# Patient Record
Sex: Male | Born: 1935
Health system: Southern US, Community
[De-identification: ages and names within clinical notes are randomized; demographics above are authoritative.]

## PROBLEM LIST (undated history)

## (undated) DIAGNOSIS — J309 Allergic rhinitis, unspecified: Secondary | ICD-10-CM

## (undated) DIAGNOSIS — R001 Bradycardia, unspecified: Secondary | ICD-10-CM

## (undated) DIAGNOSIS — G709 Myoneural disorder, unspecified: Secondary | ICD-10-CM

## (undated) DIAGNOSIS — C801 Malignant (primary) neoplasm, unspecified: Secondary | ICD-10-CM

## (undated) DIAGNOSIS — M4800 Spinal stenosis, site unspecified: Secondary | ICD-10-CM

## (undated) DIAGNOSIS — E785 Hyperlipidemia, unspecified: Secondary | ICD-10-CM

## (undated) DIAGNOSIS — E559 Vitamin D deficiency, unspecified: Secondary | ICD-10-CM

## (undated) DIAGNOSIS — I10 Essential (primary) hypertension: Secondary | ICD-10-CM

## (undated) DIAGNOSIS — I4891 Unspecified atrial fibrillation: Secondary | ICD-10-CM

## (undated) DIAGNOSIS — M1990 Unspecified osteoarthritis, unspecified site: Secondary | ICD-10-CM

## (undated) HISTORY — DX: Unspecified osteoarthritis, unspecified site: M19.90

## (undated) HISTORY — DX: Spinal stenosis, site unspecified: M48.00

## (undated) HISTORY — PX: JOINT REPLACEMENT: SHX530

## (undated) HISTORY — DX: Allergic rhinitis, unspecified: J30.9

## (undated) HISTORY — DX: Bradycardia, unspecified: R00.1

## (undated) HISTORY — DX: Essential (primary) hypertension: I10

## (undated) HISTORY — DX: Vitamin D deficiency, unspecified: E55.9

## (undated) HISTORY — DX: Myoneural disorder, unspecified: G70.9

## (undated) HISTORY — PX: APPENDECTOMY: SHX54

## (undated) HISTORY — PX: FLEXIBLE SIGMOIDOSCOPY: SHX1649

## (undated) HISTORY — PX: HEMORRHOID SURGERY: SHX153

## (undated) HISTORY — DX: Malignant (primary) neoplasm, unspecified: C80.1

## (undated) HISTORY — DX: Hyperlipidemia, unspecified: E78.5

## (undated) HISTORY — PX: KNEE SURGERY: SHX244

## (undated) HISTORY — PX: MASTOIDECTOMY: SHX711

---
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
## 2000-02-04 ENCOUNTER — Encounter: Admission: RE | Admit: 2000-02-04 | Discharge: 2000-02-04 | Payer: Self-pay | Admitting: Internal Medicine

## 2000-02-04 ENCOUNTER — Encounter: Payer: Self-pay | Admitting: Internal Medicine

## 2001-06-23 ENCOUNTER — Ambulatory Visit (HOSPITAL_COMMUNITY): Admission: RE | Admit: 2001-06-23 | Discharge: 2001-06-23 | Payer: Self-pay | Admitting: Gastroenterology

## 2004-06-16 ENCOUNTER — Ambulatory Visit (HOSPITAL_COMMUNITY): Admission: RE | Admit: 2004-06-16 | Discharge: 2004-06-16 | Payer: Self-pay | Admitting: Urology

## 2004-06-25 ENCOUNTER — Ambulatory Visit (HOSPITAL_COMMUNITY): Admission: RE | Admit: 2004-06-25 | Discharge: 2004-06-25 | Payer: Self-pay | Admitting: Urology

## 2004-06-27 ENCOUNTER — Encounter (INDEPENDENT_AMBULATORY_CARE_PROVIDER_SITE_OTHER): Payer: Self-pay | Admitting: Specialist

## 2004-06-27 ENCOUNTER — Ambulatory Visit (HOSPITAL_COMMUNITY): Admission: RE | Admit: 2004-06-27 | Discharge: 2004-06-27 | Payer: Self-pay | Admitting: Urology

## 2004-07-10 ENCOUNTER — Ambulatory Visit (HOSPITAL_COMMUNITY): Admission: RE | Admit: 2004-07-10 | Discharge: 2004-07-10 | Payer: Self-pay | Admitting: Surgery

## 2006-02-26 IMAGING — PT XXNM PET IMAGE SKULL BASE TO THIGH **DO NOT USE THIS ONE***
5 series · 25 of 25 positions shown · non-contrast
Comparison: CT images obtained during biopsy performed 06/27/04.

CLINICAL DATA: Prostate carcinoma.  Retroperitoneal mass demonstrating spindle cell proliferation on percutaneous biopsy.
FDG PET-CT TUMOR IMAGING (SKULL BASE TO THIGHS):

Fasting Blood Glucose:  94
TECHNIQUE: 17.2 mCi F-18 FDG were administered via the right antecubital fossa.  Full ring PET imaging was performed from the skull base through the mid-thighs 54 minutes after injection.  CT data was obtained and used for attenuation correction and anatomic localization only.  (This was not acquired as a diagnostic CT examination.)

[Series 1: pet ac · axial · 3.3mm · 4.69mm/px · z∈[-1017,-3]mm · 7 of 311 slices shown]
[im 1/311]
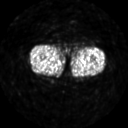
[im 52/311]
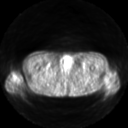
[im 104/311]
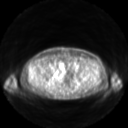
[im 156/311]
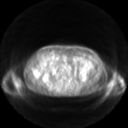
[im 207/311]
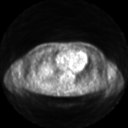
[im 259/311]
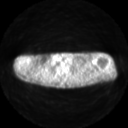
[im 311/311]
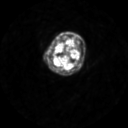

[Series 2: ct images · axial · 3.8mm · 0.98mm/px · z∈[-1017,-4]mm · 8 of 311 slices shown]
[im 1/311]
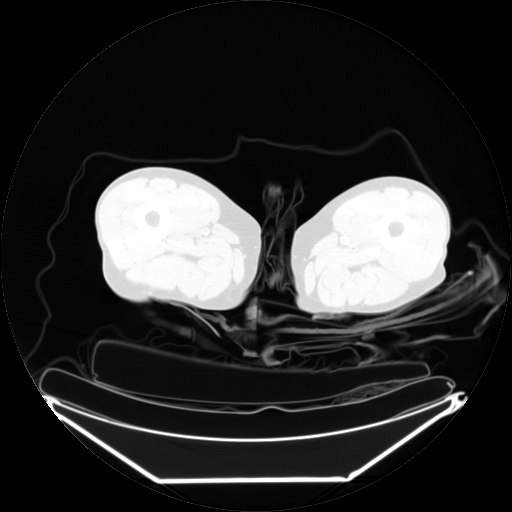
[im 45/311]
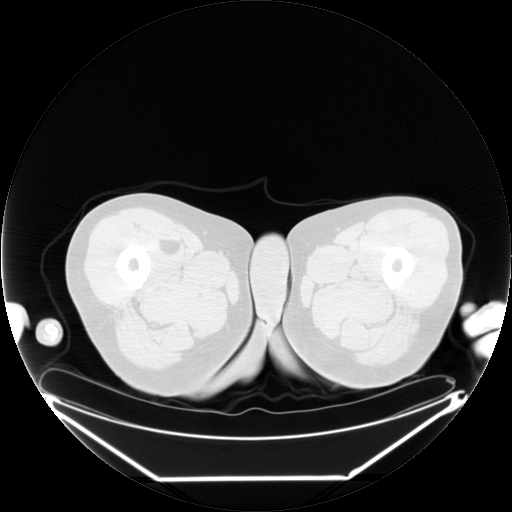
[im 89/311]
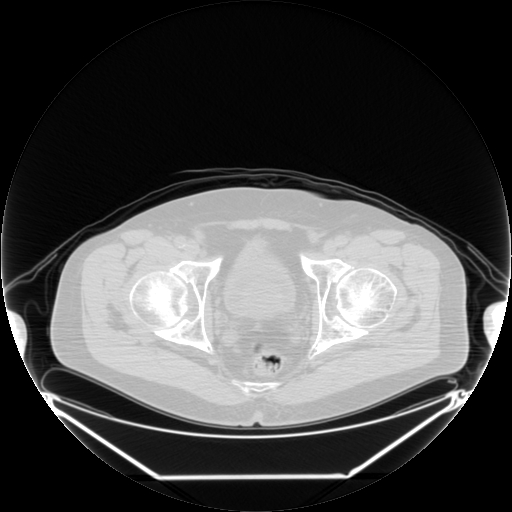
[im 133/311]
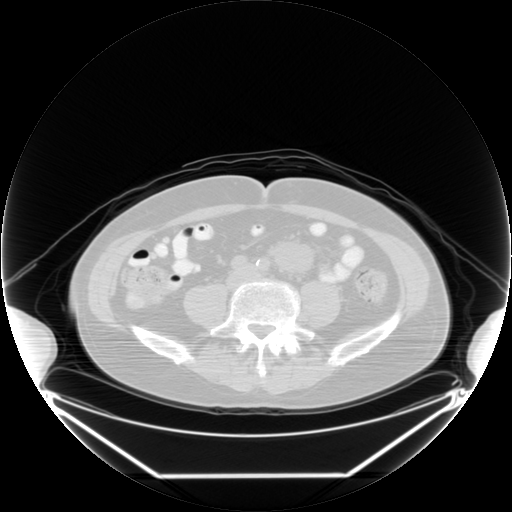
[im 178/311]
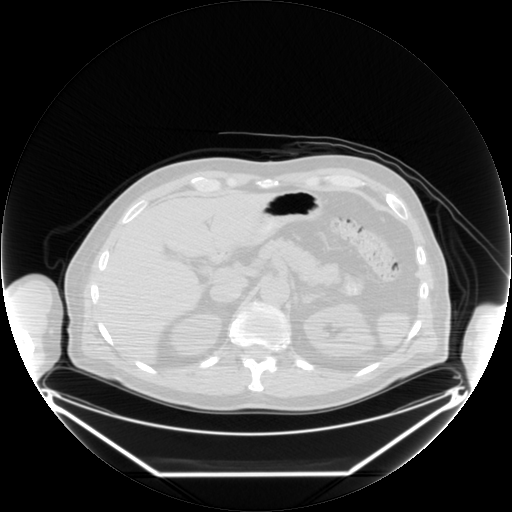
[im 222/311]
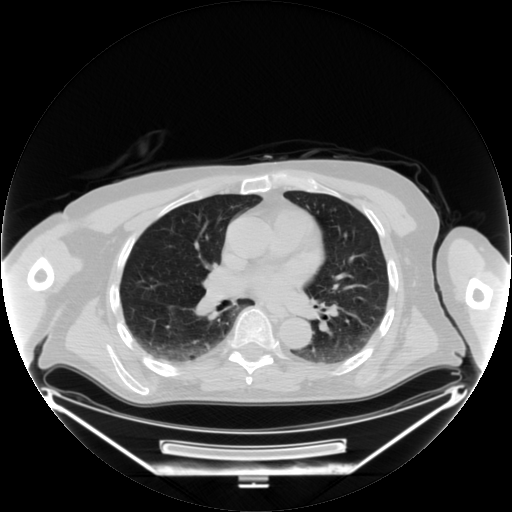
[im 266/311]
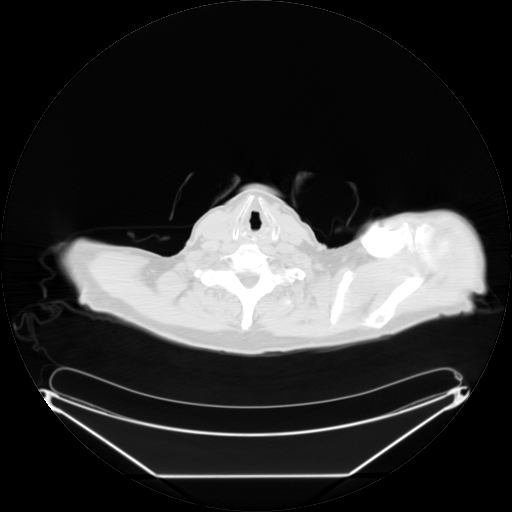
[im 311/311]
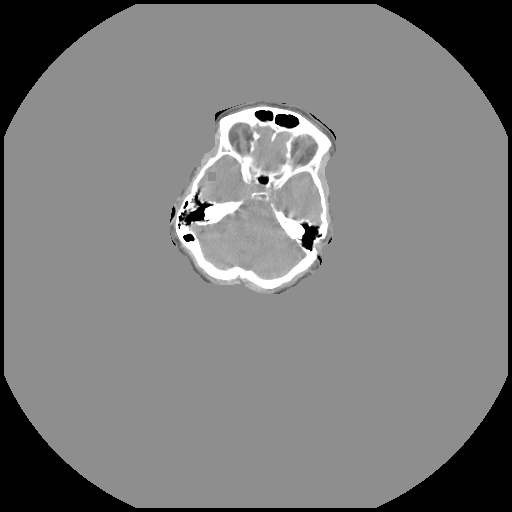

[Series 2: pet nac · axial · 3.3mm · 4.69mm/px · z∈[-1017,-3]mm · 8 of 311 slices shown]
[im 1/311]
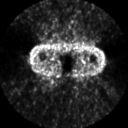
[im 45/311]
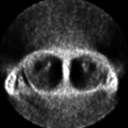
[im 89/311]
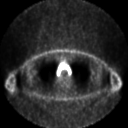
[im 133/311]
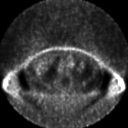
[im 178/311]
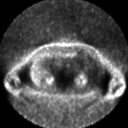
[im 222/311]
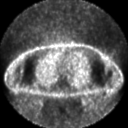
[im 266/311]
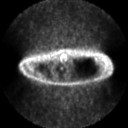
[im 311/311]
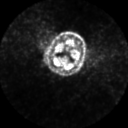

[Series 123: mip · coronal · 3.3mm · 4.69mm/px · 1 of 30 slices shown]
[im 1/30]
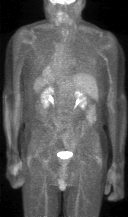

[Series 150: reformatted · axial · 3.3mm · 0.98mm/px · 1 of 3 slices shown]
[im 1/3]
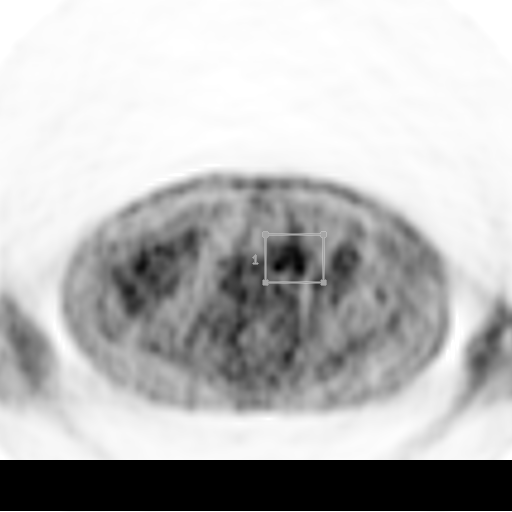

[25 of 25 positions shown; findings below may reference images not displayed]

FINDINGS: There is minimal metabolic activity associated with the left mesenteric mass situated at the level of the iliac crest.  This mass measures 3.2 x 4.5 cm transverse on CT image 178.  It demonstrates a maximal SUV of 4.4 gm/mL.  No other mesenteric masses are demonstrated.  There is no hypermetabolic nodal activity in the abdomen or pelvis.  Prostate gland is noted to be moderately enlarged.  
No abnormal hypermetabolic activity is present in the neck or chest.
IMPRESSION: 1.  The patient's known mesenteric mass demonstrates low-level hypermetabolic activity with a maximal SUV of 4.4 gm/mL.
2.  No other hypermetabolic activity is demonstrated on this examination.

## 2006-07-08 ENCOUNTER — Encounter (INDEPENDENT_AMBULATORY_CARE_PROVIDER_SITE_OTHER): Payer: Self-pay | Admitting: Surgery

## 2006-07-08 ENCOUNTER — Ambulatory Visit (HOSPITAL_BASED_OUTPATIENT_CLINIC_OR_DEPARTMENT_OTHER): Admission: RE | Admit: 2006-07-08 | Discharge: 2006-07-08 | Payer: Self-pay | Admitting: Surgery

## 2006-07-08 HISTORY — PX: OTHER SURGICAL HISTORY: SHX169

## 2006-10-27 ENCOUNTER — Ambulatory Visit (HOSPITAL_COMMUNITY): Admission: RE | Admit: 2006-10-27 | Discharge: 2006-10-27 | Payer: Self-pay | Admitting: Urology

## 2007-11-14 ENCOUNTER — Ambulatory Visit: Payer: Self-pay | Admitting: Vascular Surgery

## 2007-12-05 ENCOUNTER — Ambulatory Visit (HOSPITAL_BASED_OUTPATIENT_CLINIC_OR_DEPARTMENT_OTHER): Admission: RE | Admit: 2007-12-05 | Discharge: 2007-12-05 | Payer: Self-pay | Admitting: Surgery

## 2007-12-05 ENCOUNTER — Encounter (INDEPENDENT_AMBULATORY_CARE_PROVIDER_SITE_OTHER): Payer: Self-pay | Admitting: Surgery

## 2009-11-22 ENCOUNTER — Ambulatory Visit: Payer: Self-pay | Admitting: Cardiovascular Disease

## 2010-03-08 ENCOUNTER — Encounter: Payer: Self-pay | Admitting: Surgery

## 2010-05-02 ENCOUNTER — Other Ambulatory Visit: Payer: Self-pay | Admitting: Dermatology

## 2010-05-20 ENCOUNTER — Other Ambulatory Visit: Payer: Self-pay | Admitting: Dermatology

## 2010-07-01 NOTE — Op Note (Signed)
[name], [name]              ACCOUNT NO.:  [id number]   MEDICAL RECORD NO.:  [id number]          PATIENT TYPE:  AMB   LOCATION:  DSC                          FACILITY:  [facility]   PHYSICIAN:  Velora Heckler, MD      DATE OF BIRTH:  [birth year]   DATE OF PROCEDURE:  12/05/2007  DATE OF DISCHARGE:                               OPERATIVE REPORT   PREOPERATIVE DIAGNOSIS:  Soft tissue mass, left face (2.5 cm).   POSTOPERATIVE DIAGNOSIS:  Soft tissue mass, left face (2.5 cm).   PROCEDURE:  Excision of soft tissue mass, left face.   SURGEON:  Velora Heckler, MD, FACS   ANESTHESIA:  Local with intravenous sedation.   PREPARATION:  Betadine.   COMPLICATIONS:  None.   INDICATIONS:  The patient is a 77 year old white male who is well-known  to my practice.  The patient had a previous soft tissue mass excised  from the left posterior neck which proved to be a benign lipoma.  The  patient now presents with an enlarging mass at the edge of the mid left  mandible.  This was increasing in size.  On palpation, it was consistent  with a lipoma.  He now comes to surgery for excision.   BODY OF REPORT:  Procedure was done in OR #2 at the [facility].  The patient was placed on the operating room table in a supine  position.  The head was turned slightly to the right.  Field was prepped  and draped in the usual strict aseptic fashion.  Skin was anesthetized  with local anesthetic with epinephrine.  A 2-cm incision was made with a  #15 blade and dissection carried through subcutaneous tissues.  Mass was  identified and gently dissected out with blunt dissection and use of the  electrocautery for hemostasis.  The facial artery was identified and  preserved.  No salivary gland tissue was identified.  This appears to be  large lipoma measuring approximately 2.5 cm in greatest dimension.  It  was mobilized circumferentially and its fine vascular supply divided  with electrocautery  with good hemostasis achieved.  The entire mass was  excised and submitted in toto to pathology for review.  Good hemostasis  was obtained throughout the wound with the electrocautery.  Skin edges  were reapproximated with running 4-0 Monocryl subcuticular suture.  Wound was washed and dried and Dermabond was placed as adherent  dressing.  The patient tolerated the procedure well.      Velora Heckler, MD  Electronically Signed     TMG/MEDQ  D:  12/05/2007  T:  12/06/2007  Job:  [id number]

## 2010-07-01 NOTE — Procedures (Signed)
DUPLEX DEEP VENOUS EXAM - LOWER EXTREMITY   INDICATION:  Left leg pain and swelling.   HISTORY:  Edema:  Yes.  Trauma/Surgery:  No.  Pain:  Yes.  PE:  No.  Previous DVT:  No.  Anticoagulants:  None.  Other:   DUPLEX EXAM:                CFV   SFV   PopV  PTV    GSV                R  L  R  L  R  L  R   L  R  L  Thrombosis    o  o     o     o      o     o  Spontaneous   +  +     +     +      +     +  Phasic        +  +     +     +      +     +  Augmentation  +  +     +     +      +     +  Compressible  +  +     +     +      +     +  Competent     +  +     +     +      +     +   Legend:  + - yes  o - no  p - partial  D - decreased   IMPRESSION:  No evidence of deep venous thrombosis noted in the left  leg.    _____________________________  Eugene Hora Fields, MD   MG/MEDQ  D:  11/14/2007  T:  11/14/2007  Job:  [id number]

## 2010-07-01 NOTE — Op Note (Signed)
NAMEPASHA, BROAD              ACCOUNT NO.:  [id number]   MEDICAL RECORD NO.:  [id number]          PATIENT TYPE:  AMB   LOCATION:  DSC                          FACILITY:  [facility]   PHYSICIAN:  Velora Heckler, MD      DATE OF BIRTH:  [birth year]   DATE OF PROCEDURE:  07/08/2006  DATE OF DISCHARGE:                               OPERATIVE REPORT   PREOPERATIVE DIAGNOSIS:  Left posterior cervical soft tissue mass, rule  out adenopathy.   POSTOPERATIVE DIAGNOSIS:  Left posterior cervical neck mass (2.5 cm).   PROCEDURE:  Excision left posterior cervical neck mass (2.5 cm).   SURGEON:  Velora Heckler, M.D., FACS   ANESTHESIA:  Local with intravenous sedation.   PREPARATION:  Betadine.   COMPLICATIONS:  None   INDICATIONS:  The patient is a 77 year old white male, who presents with  left posterior cervical mass.  This was noted initially by his daughter.  It has been non-tender.  On exam, this appears to be a large posterior  cervical chain lymph node.  He now comes to surgery for excision.   PROCEDURE  The procedure is done in OR #6 at the [facility].  The  patient is brought to the operating room, placed in a supine position on  the operating room table.  The patient is turned to a right lateral  decubitus position and supported with a bean-bag.  Intravenous sedation  is administered.  Left posterior neck is prepped and draped in the usual  strict aseptic fashion.  After ascertaining that an adequate level of  sedation had been obtained, the skin is anesthetized with local  anesthetic.  A 2.5 cm incision is made with a #15 blade.  Dissection is  carried down through subcutaneous tissues.  Anterior edge of the  trapezius muscle is localized and moved posteriorly.  Soft tissue mass  is encountered.  This does not appear to visibly be a lymph node.  It  has the appearance of a lipoma.  It is excised in its entirety using the  electrocautery for hemostasis.  It is  submitted fresh to pathology in  saline.  Good hemostasis is noted.  Skin edges are reapproximated with  interrupted 3-0 Vicryl subcuticular sutures.  Wound is washed and dried,  and Benzoin and Steri-Strips are applied.  Sterile dressings are  applied.  The patient is awakened from anesthesia and brought to the  recovery room in stable condition.  The patient tolerated the procedure  well.     Velora Heckler, MD  Electronically Signed    TMG/MEDQ  D:  07/08/2006  T:  07/08/2006  Job:  [id number]   cc:   Lucky Cowboy, M.D.

## 2010-07-03 ENCOUNTER — Other Ambulatory Visit: Payer: Self-pay | Admitting: Dermatology

## 2010-07-04 NOTE — Procedures (Signed)
[place]  Patient:    Eugene Friedman, Eugene Friedman Visit Number: [id number] MRN: [id number]          Service Type: END Location: ENDO Attending Physician:  Orland Mustard Dictated by:   Llana Aliment. Randa Evens, M.D. Proc. Date: 06/23/01 Admit Date:  06/23/2001   CC:         Marinus Maw, M.D.   Procedure Report  DATE OF BIRTH:  [birth year].  PROCEDURE:  Colonoscopy.  MEDICATIONS:  Fentanyl 50 mcg, Versed 4 mg IV.  SCOPE:  Olympus adult video colonoscope.  INDICATION:  Colon cancer screening.  DESCRIPTION OF PROCEDURE:  The procedure had been explained to the patient and consent obtained.  With the patient in the left lateral decubitus position, the Olympus adult video colonoscope inserted, advanced under direct visualization.  The prep was excellent, and we were able to reach the cecum without difficulty.  The ileocecal valve and appendiceal orifice were seen. The scope was withdrawn, and the cecum, ascending colon, hepatic flexure, transverse colon, splenic flexure, descending, and sigmoid colon were seen well upon removal with no polyps or other lesions seen.  The patient tolerated the procedure well, maintained on low-flow oxygen and pulse oximeter throughout the procedure.  ASSESSMENT:  Normal screening colonoscopy.  PLAN:  Yearly Hemoccults, routine follow-up, and consideration of another procedure in five to 10 years. Dictated by:   Llana Aliment. Randa Evens, M.D. Attending Physician:  Orland Mustard DD:  06/23/01 TD:  06/24/01 Job: [id number]

## 2010-11-17 LAB — BASIC METABOLIC PANEL
BUN: 20
CO2: 27
Chloride: 107
Glucose, Bld: 94
Potassium: 5
Sodium: 140

## 2011-02-26 DIAGNOSIS — Z09 Encounter for follow-up examination after completed treatment for conditions other than malignant neoplasm: Secondary | ICD-10-CM | POA: Diagnosis not present

## 2011-02-26 DIAGNOSIS — M1710 Unilateral primary osteoarthritis, unspecified knee: Secondary | ICD-10-CM | POA: Diagnosis not present

## 2011-02-26 DIAGNOSIS — M25469 Effusion, unspecified knee: Secondary | ICD-10-CM | POA: Diagnosis not present

## 2011-02-26 DIAGNOSIS — Z9889 Other specified postprocedural states: Secondary | ICD-10-CM | POA: Diagnosis not present

## 2011-02-26 DIAGNOSIS — Z96659 Presence of unspecified artificial knee joint: Secondary | ICD-10-CM | POA: Diagnosis not present

## 2011-02-26 DIAGNOSIS — Z471 Aftercare following joint replacement surgery: Secondary | ICD-10-CM | POA: Diagnosis not present

## 2011-03-25 ENCOUNTER — Other Ambulatory Visit: Payer: Self-pay | Admitting: Orthopaedic Surgery

## 2011-03-25 DIAGNOSIS — M779 Enthesopathy, unspecified: Secondary | ICD-10-CM | POA: Diagnosis not present

## 2011-03-25 DIAGNOSIS — M79669 Pain in unspecified lower leg: Secondary | ICD-10-CM

## 2011-03-26 ENCOUNTER — Ambulatory Visit
Admission: RE | Admit: 2011-03-26 | Discharge: 2011-03-26 | Disposition: A | Discharge status: Home / Self Care | Payer: Medicare Other | Source: Ambulatory Visit | Attending: Orthopaedic Surgery | Admitting: Orthopaedic Surgery

## 2011-03-26 DIAGNOSIS — M79609 Pain in unspecified limb: Secondary | ICD-10-CM | POA: Diagnosis not present

## 2011-03-26 DIAGNOSIS — M79669 Pain in unspecified lower leg: Secondary | ICD-10-CM

## 2011-04-28 ENCOUNTER — Encounter: Payer: Self-pay | Admitting: *Deleted

## 2011-05-18 DIAGNOSIS — E559 Vitamin D deficiency, unspecified: Secondary | ICD-10-CM | POA: Diagnosis not present

## 2011-05-18 DIAGNOSIS — I10 Essential (primary) hypertension: Secondary | ICD-10-CM | POA: Diagnosis not present

## 2011-05-18 DIAGNOSIS — E782 Mixed hyperlipidemia: Secondary | ICD-10-CM | POA: Diagnosis not present

## 2011-05-18 DIAGNOSIS — R7309 Other abnormal glucose: Secondary | ICD-10-CM | POA: Diagnosis not present

## 2011-05-18 DIAGNOSIS — Z79899 Other long term (current) drug therapy: Secondary | ICD-10-CM | POA: Diagnosis not present

## 2011-05-18 DIAGNOSIS — C61 Malignant neoplasm of prostate: Secondary | ICD-10-CM | POA: Diagnosis not present

## 2011-05-18 DIAGNOSIS — Z1212 Encounter for screening for malignant neoplasm of rectum: Secondary | ICD-10-CM | POA: Diagnosis not present

## 2011-07-06 DIAGNOSIS — C61 Malignant neoplasm of prostate: Secondary | ICD-10-CM | POA: Diagnosis not present

## 2011-07-21 ENCOUNTER — Other Ambulatory Visit: Payer: Self-pay | Admitting: Dermatology

## 2011-07-21 DIAGNOSIS — L821 Other seborrheic keratosis: Secondary | ICD-10-CM | POA: Diagnosis not present

## 2011-07-21 DIAGNOSIS — D239 Other benign neoplasm of skin, unspecified: Secondary | ICD-10-CM | POA: Diagnosis not present

## 2011-07-21 DIAGNOSIS — C61 Malignant neoplasm of prostate: Secondary | ICD-10-CM | POA: Diagnosis not present

## 2011-07-21 DIAGNOSIS — L819 Disorder of pigmentation, unspecified: Secondary | ICD-10-CM | POA: Diagnosis not present

## 2011-07-21 DIAGNOSIS — D2370 Other benign neoplasm of skin of unspecified lower limb, including hip: Secondary | ICD-10-CM | POA: Diagnosis not present

## 2011-07-21 DIAGNOSIS — D485 Neoplasm of uncertain behavior of skin: Secondary | ICD-10-CM | POA: Diagnosis not present

## 2011-07-23 DIAGNOSIS — H6120 Impacted cerumen, unspecified ear: Secondary | ICD-10-CM | POA: Diagnosis not present

## 2011-07-23 DIAGNOSIS — H40019 Open angle with borderline findings, low risk, unspecified eye: Secondary | ICD-10-CM | POA: Diagnosis not present

## 2011-07-23 DIAGNOSIS — H2510 Age-related nuclear cataract, unspecified eye: Secondary | ICD-10-CM | POA: Diagnosis not present

## 2011-07-23 DIAGNOSIS — I10 Essential (primary) hypertension: Secondary | ICD-10-CM | POA: Diagnosis not present

## 2011-10-29 DIAGNOSIS — M25559 Pain in unspecified hip: Secondary | ICD-10-CM | POA: Diagnosis not present

## 2011-11-03 DIAGNOSIS — Z79899 Other long term (current) drug therapy: Secondary | ICD-10-CM | POA: Diagnosis not present

## 2011-11-03 DIAGNOSIS — E782 Mixed hyperlipidemia: Secondary | ICD-10-CM | POA: Diagnosis not present

## 2011-11-03 DIAGNOSIS — E559 Vitamin D deficiency, unspecified: Secondary | ICD-10-CM | POA: Diagnosis not present

## 2011-11-03 DIAGNOSIS — Z23 Encounter for immunization: Secondary | ICD-10-CM | POA: Diagnosis not present

## 2011-11-03 DIAGNOSIS — R7309 Other abnormal glucose: Secondary | ICD-10-CM | POA: Diagnosis not present

## 2011-11-03 DIAGNOSIS — I10 Essential (primary) hypertension: Secondary | ICD-10-CM | POA: Diagnosis not present

## 2011-11-09 DIAGNOSIS — M1610 Unilateral primary osteoarthritis, unspecified hip: Secondary | ICD-10-CM | POA: Diagnosis not present

## 2011-11-16 DIAGNOSIS — G5710 Meralgia paresthetica, unspecified lower limb: Secondary | ICD-10-CM | POA: Diagnosis not present

## 2011-11-16 DIAGNOSIS — M47817 Spondylosis without myelopathy or radiculopathy, lumbosacral region: Secondary | ICD-10-CM | POA: Diagnosis not present

## 2011-11-16 DIAGNOSIS — M1610 Unilateral primary osteoarthritis, unspecified hip: Secondary | ICD-10-CM | POA: Diagnosis not present

## 2011-11-19 ENCOUNTER — Encounter: Payer: Self-pay | Admitting: Cardiovascular Disease

## 2012-02-04 DIAGNOSIS — H2510 Age-related nuclear cataract, unspecified eye: Secondary | ICD-10-CM | POA: Diagnosis not present

## 2012-02-08 DIAGNOSIS — E559 Vitamin D deficiency, unspecified: Secondary | ICD-10-CM | POA: Diagnosis not present

## 2012-02-08 DIAGNOSIS — Z79899 Other long term (current) drug therapy: Secondary | ICD-10-CM | POA: Diagnosis not present

## 2012-02-08 DIAGNOSIS — E782 Mixed hyperlipidemia: Secondary | ICD-10-CM | POA: Diagnosis not present

## 2012-02-08 DIAGNOSIS — J40 Bronchitis, not specified as acute or chronic: Secondary | ICD-10-CM | POA: Diagnosis not present

## 2012-02-08 DIAGNOSIS — I10 Essential (primary) hypertension: Secondary | ICD-10-CM | POA: Diagnosis not present

## 2012-02-26 DIAGNOSIS — H2510 Age-related nuclear cataract, unspecified eye: Secondary | ICD-10-CM | POA: Diagnosis not present

## 2012-02-26 DIAGNOSIS — H40019 Open angle with borderline findings, low risk, unspecified eye: Secondary | ICD-10-CM | POA: Diagnosis not present

## 2012-03-29 DIAGNOSIS — L259 Unspecified contact dermatitis, unspecified cause: Secondary | ICD-10-CM | POA: Diagnosis not present

## 2012-03-29 DIAGNOSIS — L821 Other seborrheic keratosis: Secondary | ICD-10-CM | POA: Diagnosis not present

## 2012-03-29 DIAGNOSIS — D1801 Hemangioma of skin and subcutaneous tissue: Secondary | ICD-10-CM | POA: Diagnosis not present

## 2012-03-29 DIAGNOSIS — Z85828 Personal history of other malignant neoplasm of skin: Secondary | ICD-10-CM | POA: Diagnosis not present

## 2012-03-29 DIAGNOSIS — L723 Sebaceous cyst: Secondary | ICD-10-CM | POA: Diagnosis not present

## 2012-05-23 DIAGNOSIS — N419 Inflammatory disease of prostate, unspecified: Secondary | ICD-10-CM | POA: Diagnosis not present

## 2012-05-23 DIAGNOSIS — N320 Bladder-neck obstruction: Secondary | ICD-10-CM | POA: Diagnosis not present

## 2012-07-18 DIAGNOSIS — I10 Essential (primary) hypertension: Secondary | ICD-10-CM | POA: Diagnosis not present

## 2012-07-18 DIAGNOSIS — Z79899 Other long term (current) drug therapy: Secondary | ICD-10-CM | POA: Diagnosis not present

## 2012-07-18 DIAGNOSIS — E782 Mixed hyperlipidemia: Secondary | ICD-10-CM | POA: Diagnosis not present

## 2012-07-18 DIAGNOSIS — C61 Malignant neoplasm of prostate: Secondary | ICD-10-CM | POA: Diagnosis not present

## 2012-07-18 DIAGNOSIS — Z125 Encounter for screening for malignant neoplasm of prostate: Secondary | ICD-10-CM | POA: Diagnosis not present

## 2012-07-18 DIAGNOSIS — R7309 Other abnormal glucose: Secondary | ICD-10-CM | POA: Diagnosis not present

## 2012-07-18 DIAGNOSIS — Z1212 Encounter for screening for malignant neoplasm of rectum: Secondary | ICD-10-CM | POA: Diagnosis not present

## 2012-07-18 DIAGNOSIS — E559 Vitamin D deficiency, unspecified: Secondary | ICD-10-CM | POA: Diagnosis not present

## 2012-07-20 DIAGNOSIS — C61 Malignant neoplasm of prostate: Secondary | ICD-10-CM | POA: Diagnosis not present

## 2012-07-27 DIAGNOSIS — C61 Malignant neoplasm of prostate: Secondary | ICD-10-CM | POA: Diagnosis not present

## 2012-07-28 DIAGNOSIS — H353190 Nonexudative age-related macular degeneration, unspecified eye, stage unspecified: Secondary | ICD-10-CM | POA: Diagnosis not present

## 2012-07-28 DIAGNOSIS — H2510 Age-related nuclear cataract, unspecified eye: Secondary | ICD-10-CM | POA: Diagnosis not present

## 2012-11-16 DIAGNOSIS — N390 Urinary tract infection, site not specified: Secondary | ICD-10-CM | POA: Diagnosis not present

## 2012-11-16 DIAGNOSIS — I10 Essential (primary) hypertension: Secondary | ICD-10-CM | POA: Diagnosis not present

## 2012-11-16 DIAGNOSIS — Z79899 Other long term (current) drug therapy: Secondary | ICD-10-CM | POA: Diagnosis not present

## 2012-11-16 DIAGNOSIS — E782 Mixed hyperlipidemia: Secondary | ICD-10-CM | POA: Diagnosis not present

## 2012-12-05 DIAGNOSIS — Z23 Encounter for immunization: Secondary | ICD-10-CM | POA: Diagnosis not present

## 2012-12-16 DIAGNOSIS — M1610 Unilateral primary osteoarthritis, unspecified hip: Secondary | ICD-10-CM | POA: Diagnosis not present

## 2013-01-05 ENCOUNTER — Ambulatory Visit: Payer: Medicare Other | Admitting: Physician Assistant

## 2013-01-05 ENCOUNTER — Encounter: Payer: Self-pay | Admitting: Physician Assistant

## 2013-01-05 VITALS — BP 142/78 | HR 60 | Temp 98.5°F | Resp 16 | Ht 74.5 in | Wt 224.0 lb

## 2013-01-05 DIAGNOSIS — I10 Essential (primary) hypertension: Secondary | ICD-10-CM

## 2013-01-05 DIAGNOSIS — R002 Palpitations: Secondary | ICD-10-CM

## 2013-01-05 DIAGNOSIS — J209 Acute bronchitis, unspecified: Secondary | ICD-10-CM

## 2013-01-05 DIAGNOSIS — R001 Bradycardia, unspecified: Secondary | ICD-10-CM | POA: Insufficient documentation

## 2013-01-05 DIAGNOSIS — H60393 Other infective otitis externa, bilateral: Secondary | ICD-10-CM

## 2013-01-05 DIAGNOSIS — E785 Hyperlipidemia, unspecified: Secondary | ICD-10-CM | POA: Insufficient documentation

## 2013-01-05 MED ORDER — AZITHROMYCIN 250 MG PO TABS: ORAL_TABLET | ORAL | Status: DC

## 2013-01-05 MED ORDER — NEOMYCIN-POLYMYXIN-HC 3.5-10000-1 OT SOLN: 4.0000 [drp] | Freq: Four times a day (QID) | OTIC | Status: AC

## 2013-01-05 MED ORDER — PREDNISONE 20 MG PO TABS: ORAL_TABLET | ORAL | Status: DC

## 2013-01-05 NOTE — Progress Notes (Signed)
  Subjective:    Patient ID: Eugene Friedman, male    DOB: Sep 26, 1928, 79 y.o.   MRN: [id number]  Cough This is a new problem. The current episode started in the past 7 days. The problem has been gradually worsening. The cough is productive of brown sputum (improving to light green). Associated symptoms include chills, nasal congestion, postnasal drip, rhinorrhea and a sore throat. Pertinent negatives include no chest pain, ear pain, fever, shortness of breath or wheezing. Treatments tried: zpak. The treatment provided mild relief.  Patient just returned from cruise in [place].   Past Medical History  Diagnosis Date  . Hypertension   . Hyperlipidemia   . Palpitations   . Sinus bradycardia    Current Outpatient Prescriptions on File Prior to Visit  Medication Sig Dispense Refill  . aspirin EC 81 MG tablet Take 81 mg by mouth daily.      Marland Kitchen doxazosin (CARDURA) 4 MG tablet Take 2 mg by mouth at bedtime.      Marland Kitchen FINASTERIDE PO Take by mouth.      . Fish Oil OIL by Does not apply route.       No current facility-administered medications on file prior to visit.    Review of Systems  Constitutional: Positive for chills and fatigue. Negative for fever.  HENT: Positive for congestion, postnasal drip, rhinorrhea, sinus pressure and sore throat. Negative for ear discharge, ear pain, facial swelling, nosebleeds, sneezing, tinnitus and trouble swallowing.   Eyes: Negative.   Respiratory: Positive for cough. Negative for shortness of breath and wheezing.   Cardiovascular: Negative.  Negative for chest pain.  Gastrointestinal: Negative.   Endocrine: Negative.   Genitourinary: Negative.   Neurological: Negative.        Objective:   Physical Exam  Constitutional: He appears well-developed and well-nourished.  HENT:  Head: Normocephalic.  Eyes: Conjunctivae and EOM are normal. Pupils are equal, round, and reactive to light.  Neck: Normal range of motion. Neck supple.  Cardiovascular: Normal  rate, regular rhythm and normal heart sounds.   Pulmonary/Chest: No respiratory distress. He has wheezes (expiratory diffuse). He has no rales. He exhibits no tenderness.  Abdominal: Soft. Bowel sounds are normal.  Lymphadenopathy:    He has no cervical adenopathy.      Assessment & Plan:  1. Otitis, externa, infective, bilateral - predniSONE (DELTASONE) 20 MG tablet; take one tablet two times daily with food for 3 days, take one tablet daily for 5 days.  Dispense: 11 tablet; Refill: 0  2. Acute bronchitis - azithromycin (ZITHROMAX) 250 MG tablet; Take two tablets the first day then one tablet daily for 4 days.  Dispense: 6 tablet; Refill: 1 - neomycin-polymyxin-hydrocortisone (CORTISPORIN) otic solution; Place 4 drops into both ears 4 (four) times daily.  Dispense: 10 mL; Refill: 3  Follow up in the office if not better and if you have CP, SOB go to the ER.

## 2013-01-06 ENCOUNTER — Ambulatory Visit: Payer: Self-pay | Admitting: Physician Assistant

## 2013-01-06 DIAGNOSIS — L821 Other seborrheic keratosis: Secondary | ICD-10-CM | POA: Diagnosis not present

## 2013-01-06 DIAGNOSIS — I789 Disease of capillaries, unspecified: Secondary | ICD-10-CM | POA: Diagnosis not present

## 2013-01-06 DIAGNOSIS — L738 Other specified follicular disorders: Secondary | ICD-10-CM | POA: Diagnosis not present

## 2013-02-02 DIAGNOSIS — H2510 Age-related nuclear cataract, unspecified eye: Secondary | ICD-10-CM | POA: Diagnosis not present

## 2013-02-02 DIAGNOSIS — H04129 Dry eye syndrome of unspecified lacrimal gland: Secondary | ICD-10-CM | POA: Diagnosis not present

## 2013-02-07 ENCOUNTER — Encounter: Payer: Self-pay | Admitting: Internal Medicine

## 2013-02-12 DIAGNOSIS — R972 Elevated prostate specific antigen [PSA]: Secondary | ICD-10-CM | POA: Insufficient documentation

## 2013-02-12 DIAGNOSIS — R0989 Other specified symptoms and signs involving the circulatory and respiratory systems: Secondary | ICD-10-CM | POA: Insufficient documentation

## 2013-02-12 DIAGNOSIS — Z8639 Personal history of other endocrine, nutritional and metabolic disease: Secondary | ICD-10-CM | POA: Insufficient documentation

## 2013-02-12 DIAGNOSIS — E559 Vitamin D deficiency, unspecified: Secondary | ICD-10-CM | POA: Insufficient documentation

## 2013-02-12 NOTE — Patient Instructions (Addendum)
Continue diet & medications same as discussed.   Further disposition pending lab results.    Restart bisoprolol/ Hctz 5/6.25  At 1/2 tablet each morning for BP - if BP stays up over 160 , may increase back to 1 whole pill  Decrease Doxazosin 4 mg to 1/2 tablet at bedtime -    Hypertension As your heart beats, it forces blood through your arteries. This force is your blood pressure. If the pressure is too high, it is called hypertension (HTN) or high blood pressure. HTN is dangerous because you may have it and not know it. High blood pressure may mean that your heart has to work harder to pump blood. Your arteries may be narrow or stiff. The extra work puts you at risk for heart disease, stroke, and other problems.  Blood pressure consists of two numbers, a higher number over a lower, 110/72, for example. It is stated as "110 over 72." The ideal is below 120 for the top number (systolic) and under 80 for the bottom (diastolic). Write down your blood pressure today. You should pay close attention to your blood pressure if you have certain conditions such as:  Heart failure.  Prior heart attack.  Diabetes  Chronic kidney disease.  Prior stroke.  Multiple risk factors for heart disease. To see if you have HTN, your blood pressure should be measured while you are seated with your arm held at the level of the heart. It should be measured at least twice. A one-time elevated blood pressure reading (especially in the Emergency Department) does not mean that you need treatment. There may be conditions in which the blood pressure is different between your right and left arms. It is important to see your caregiver soon for a recheck. Most people have essential hypertension which means that there is not a specific cause. This type of high blood pressure may be lowered by changing lifestyle factors such as:  Stress.  Smoking.  Lack of exercise.  Excessive weight.  Drug/tobacco/alcohol  use.  Eating less salt. Most people do not have symptoms from high blood pressure until it has caused damage to the body. Effective treatment can often prevent, delay or reduce that damage. TREATMENT  When a cause has been identified, treatment for high blood pressure is directed at the cause. There are a large number of medications to treat HTN. These fall into several categories, and your caregiver will help you select the medicines that are best for you. Medications may have side effects. You should review side effects with your caregiver. If your blood pressure stays high after you have made lifestyle changes or started on medicines,   Your medication(s) may need to be changed.  Other problems may need to be addressed.  Be certain you understand your prescriptions, and know how and when to take your medicine.  Be sure to follow up with your caregiver within the time frame advised (usually within two weeks) to have your blood pressure rechecked and to review your medications.  If you are taking more than one medicine to lower your blood pressure, make sure you know how and at what times they should be taken. Taking two medicines at the same time can result in blood pressure that is too low. SEEK IMMEDIATE MEDICAL CARE IF:  You develop a severe headache, blurred or changing vision, or confusion.  You have unusual weakness or numbness, or a faint feeling.  You have severe chest or abdominal pain, vomiting, or breathing problems. MAKE  SURE YOU:   Understand these instructions.  Will watch your condition.  Will get help right away if you are not doing well or get worse. Document Released: 02/02/2005 Document Revised: 04/27/2011 Document Reviewed: 09/23/2007 [facility] Patient Information 2014 [place], [place]. Diabetes and Exercise Exercising regularly is important. It is not just about losing weight. It has many health benefits, such as:  Improving your overall fitness, flexibility,  and endurance.  Increasing your bone density.  Helping with weight control.  Decreasing your body fat.  Increasing your muscle strength.  Reducing stress and tension.  Improving your overall health. People with diabetes who exercise gain additional benefits because exercise:  Reduces appetite.  Improves the body's use of blood sugar (glucose).  Helps lower or control blood glucose.  Decreases blood pressure.  Helps control blood lipids (such as cholesterol and triglycerides).  Improves the body's use of the hormone insulin by:  Increasing the body's insulin sensitivity.  Reducing the body's insulin needs.  Decreases the risk for heart disease because exercising:  Lowers cholesterol and triglycerides levels.  Increases the levels of good cholesterol (such as high-density lipoproteins [HDL]) in the body.  Lowers blood glucose levels. YOUR ACTIVITY PLAN  Choose an activity that you enjoy and set realistic goals. Your health care provider or diabetes educator can help you make an activity plan that works for you. You can break activities into 2 or 3 sessions throughout the day. Doing so is as good as one long session. Exercise ideas include:  Taking the dog for a walk.  Taking the stairs instead of the elevator.  Dancing to your favorite song.  Doing your favorite exercise with a friend. RECOMMENDATIONS FOR EXERCISING WITH TYPE 1 OR TYPE 2 DIABETES   Check your blood glucose before exercising. If blood glucose levels are greater than 240 mg/dL, check for urine ketones. Do not exercise if ketones are present.  Avoid injecting insulin into areas of the body that are going to be exercised. For example, avoid injecting insulin into:  The arms when playing tennis.  The legs when jogging.  Keep a record of:  Food intake before and after you exercise.  Expected peak times of insulin action.  Blood glucose levels before and after you exercise.  The type and  amount of exercise you have done.  Review your records with your health care provider. Your health care provider will help you to develop guidelines for adjusting food intake and insulin amounts before and after exercising.  If you take insulin or oral hypoglycemic agents, watch for signs and symptoms of hypoglycemia. They include:  Dizziness.  Shaking.  Sweating.  Chills.  Confusion.  Drink plenty of water while you exercise to prevent dehydration or heat stroke. Body water is lost during exercise and must be replaced.  Talk to your health care provider before starting an exercise program to make sure it is safe for you. Remember, almost any type of activity is better than none. Document Released: 04/25/2003 Document Revised: 10/05/2012 Document Reviewed: 07/12/2012 [facility] Patient Information 2014 [place], [place]. Cholesterol Cholesterol is a white, waxy, fat-like protein needed by your body in small amounts. The liver makes all the cholesterol you need. It is carried from the liver by the blood through the blood vessels. Deposits (plaque) may build up on blood vessel walls. This makes the arteries narrower and stiffer. Plaque increases the risk for heart attack and stroke. You cannot feel your cholesterol level even if it is very high. The only way  to know is by a blood test to check your lipid (fats) levels. Once you know your cholesterol levels, you should keep a record of the test results. Work with your caregiver to to keep your levels in the desired range. WHAT THE RESULTS MEAN:  Total cholesterol is a rough measure of all the cholesterol in your blood.  LDL is the so-called bad cholesterol. This is the type that deposits cholesterol in the walls of the arteries. You want this level to be low.  HDL is the good cholesterol because it cleans the arteries and carries the LDL away. You want this level to be high.  Triglycerides are fat that the body can either burn for energy or  store. High levels are closely linked to heart disease. DESIRED LEVELS:  Total cholesterol below 200.  LDL below 100 for people at risk, below 70 for very high risk.  HDL above 50 is good, above 60 is best.  Triglycerides below 150. HOW TO LOWER YOUR CHOLESTEROL:  Diet.  Choose fish or white meat chicken and [place], roasted or baked. Limit fatty cuts of red meat, fried foods, and processed meats, such as sausage and lunch meat.  Eat lots of fresh fruits and vegetables. Choose whole grains, beans, pasta, potatoes and cereals.  Use only small amounts of olive, corn or canola oils. Avoid butter, mayonnaise, shortening or palm kernel oils. Avoid foods with trans-fats.  Use skim/nonfat milk and low-fat/nonfat yogurt and cheeses. Avoid whole milk, cream, ice cream, egg yolks and cheeses. Healthy desserts include angel food cake, ginger snaps, animal crackers, hard candy, popsicles, and low-fat/nonfat frozen yogurt. Avoid pastries, cakes, pies and cookies.  Exercise.  A regular program helps decrease LDL and raises HDL.  Helps with weight control.  Do things that increase your activity level like gardening, walking, or taking the stairs.  Medication.  May be prescribed by your caregiver to help lowering cholesterol and the risk for heart disease.  You may need medicine even if your levels are normal if you have several risk factors. HOME CARE INSTRUCTIONS   Follow your diet and exercise programs as suggested by your caregiver.  Take medications as directed.  Have blood work done when your caregiver feels it is necessary. MAKE SURE YOU:   Understand these instructions.  Will watch your condition.  Will get help right away if you are not doing well or get worse. Document Released: 10/28/2000 Document Revised: 04/27/2011 Document Reviewed: 04/20/2007 [facility] Patient Information 2014 [place], [place]. Vitamin D Deficiency Vitamin D is an important vitamin that your body  needs. Having too little of it in your body is called a deficiency. A very bad deficiency can make your bones soft and can cause a condition called rickets.  Vitamin D is important to your body for different reasons, such as:   It helps your body absorb 2 minerals called calcium and phosphorus.  It helps make your bones healthy.  It may prevent some diseases, such as diabetes and multiple sclerosis.  It helps your muscles and heart. You can get vitamin D in several ways. It is a natural part of some foods. The vitamin is also added to some dairy products and cereals. Some people take vitamin D supplements. Also, your body makes vitamin D when you are in the sun. It changes the sun's rays into a form of the vitamin that your body can use. CAUSES   Not eating enough foods that contain vitamin D.  Not getting enough sunlight.  Having certain digestive system diseases that make it hard to absorb vitamin D. These diseases include Crohn's disease, chronic pancreatitis, and cystic fibrosis.  Having a surgery in which part of the stomach or small intestine is removed.  Being obese. Fat cells pull vitamin D out of your blood. That means that obese people may not have enough vitamin D left in their blood and in other body tissues.  Having chronic kidney or liver disease. RISK FACTORS Risk factors are things that make you more likely to develop a vitamin D deficiency. They include:  Being older.  Not being able to get outside very much.  Living in a nursing home.  Having had broken bones.  Having weak or thin bones (osteoporosis).  Having a disease or condition that changes how your body absorbs vitamin D.  Having dark skin.  Some medicines such as seizure medicines or steroids.  Being overweight or obese. SYMPTOMS Mild cases of vitamin D deficiency may not have any symptoms. If you have a very bad case, symptoms may include:  Bone pain.  Muscle pain.  Falling often.  Broken  bones caused by a minor injury, due to osteoporosis. DIAGNOSIS A blood test is the best way to tell if you have a vitamin D deficiency. TREATMENT Vitamin D deficiency can be treated in different ways. Treatment for vitamin D deficiency depends on what is causing it. Options include:  Taking vitamin D supplements.  Taking a calcium supplement. Your caregiver will suggest what dose is best for you. HOME CARE INSTRUCTIONS  Take any supplements that your caregiver prescribes. Follow the directions carefully. Take only the suggested amount.  Have your blood tested 2 months after you start taking supplements.  Eat foods that contain vitamin D. Healthy choices include:  Fortified dairy products, cereals, or juices. Fortified means vitamin D has been added to the food. Check the label on the package to be sure.  Fatty fish like salmon or trout.  Eggs.  Oysters.  Do not use a tanning bed.  Keep your weight at a healthy level. Lose weight if you need to.  Keep all follow-up appointments. Your caregiver will need to perform blood tests to make sure your vitamin D deficiency is going away. SEEK MEDICAL CARE IF:  You have any questions about your treatment.  You continue to have symptoms of vitamin D deficiency.  You have nausea or vomiting.  You are constipated.  You feel confused.  You have severe abdominal or back pain. MAKE SURE YOU:  Understand these instructions.  Will watch your condition.  Will get help right away if you are not doing well or get worse. Document Released: 04/27/2011 Document Revised: 05/30/2012 Document Reviewed: 04/27/2011 [facility] Patient Information 2014 [place], [place].

## 2013-02-12 NOTE — Progress Notes (Signed)
Patient ID: Eugene Friedman, male   DOB: Mar 24, 1928, 79 y.o.   MRN: [id number]   This very nice 79 y.o. WWM presents for 3 month follow up with Hypertension, Hyperlipidemia, Pre-Diabetes, Hx/o elev. PSA and Vitamin D Deficiency.    Hypertension pre dates since   . BP has been controlled at home. Today's BP: 140/74 mmHg . Patient denies any cardiac type chest pain, palpitations, dyspnea/orthopnea/PND, dizziness, claudication, or dependent edema.   Hyperlipidemia is controlled with diet & meds. Last Cholesterol was  174, Triglycerides were 132, HDL 76 and LDL 72 - all at goal. Patient denies myalgias or other med SE's.    Also, the patient is monitored for PreDiabetes with last A1c of 5.2% in June (was 5.6% in Sept 2011). Patient denies any symptoms of reactive hypoglycemia, diabetic polys, paresthesias or visual blurring.   He has Hx/o of Prostate Cancer (Gleason 3) being followed by Dr Patsi Sears.   Further, Patient has history of Vitamin D Deficiency with last vitamin D of 69 in June (was 42 in 2009). Patient supplements vitamin D without any suspected side-effects.  Medication Sig Dispense Refill  . aspirin EC 81 MG tablet Take 81 mg by mouth daily.      Marland Kitchen doxazosin (CARDURA) 4 MG tablet Take 2 mg by mouth at bedtime.      Marland Kitchen FINASTERIDE PO Take 5 mg by mouth daily.       . Fish Oil OIL by Does not apply route.      . hyoscyamine (LEVSIN, ANASPAZ) 0.125 MG tablet Take 0.125 mg by mouth every 6 (six) hours as needed.      . tadalafil (CIALIS) 5 MG tablet Take 5 mg by mouth daily as needed for erectile dysfunction.      . rosuvastatin (CRESTOR) 10 MG tablet Take 5 mg by mouth daily.         Allergies  Allergen Reactions  . Adhesive [Tape]   . Celebrex [Celecoxib] Other (See Comments)    GI upset  . Lipitor [Atorvastatin]   . Nickel   . Ppd [Tuberculin Purified Protein Derivative] Other (See Comments)    Positive PPD    PMHx:   Past Medical History  Diagnosis Date  . Hypertension    . Hyperlipidemia   . Palpitations   . Sinus bradycardia   . DJD (degenerative joint disease)   . Vitamin D deficiency   . Allergic rhinitis   . Cancer     prostate    FHx:    Reviewed / unchanged  SHx:    Reviewed / unchanged  Systems Review: Constitutional: Denies fever, chills, wt changes, headaches, insomnia, fatigue, night sweats, change in appetite. Eyes: Denies redness, blurred vision, diplopia, discharge, itchy, watery eyes.  ENT: Denies discharge, congestion, post nasal drip, epistaxis, sore throat, earache, hearing loss, dental pain, tinnitus, vertigo, sinus pain, snoring.  CV: Denies chest pain, palpitations, irregular heartbeat, syncope, dyspnea, diaphoresis, orthopnea, PND, claudication, edema. Respiratory: denies cough, dyspnea, DOE, pleurisy, hoarseness, laryngitis, wheezing.  Gastrointestinal: Denies dysphagia, odynophagia, heartburn, reflux, water brash, abdominal pain or cramps, nausea, vomiting, bloating, diarrhea, constipation, hematemesis, melena, hematochezia,  or hemorrhoids. Genitourinary: Denies dysuria, frequency, urgency, nocturia, hesitancy, discharge, hematuria, flank pain. Musculoskeletal: Denies arthralgias, myalgias, stiffness, jt. swelling, pain, limp, strain/sprain.  Skin: Denies pruritus, rash, hives, warts, acne, eczema, change in skin lesion(s). Neuro: No weakness, tremor, incoordination, spasms, paresthesia, or pain. Psychiatric: Denies confusion, memory loss, or sensory loss. Endo: Denies change in weight, skin, hair change.  Heme/Lymph:  No excessive bleeding, bruising, orenlarged lymph nodes.  BP: 140/74  Pulse: 60  Temp: 97.5 F (36.4 C)  Resp: 18    Estimated body mass index is 28.13 kg/(m^2) as calculated from the following:   Height as of 01/05/13: 6' 2.5" (1.892 m).   Weight as of this encounter: 222 lb (100.699 kg).  On Exam: Appears well nourished - in no distress. Eyes: PERRLA, EOMs, conjunctiva no swelling or  erythema. Sinuses: No frontal/maxillary tenderness ENT/Mouth: EAC's clear, TM's nl w/o erythema, bulging. Nares clear w/o erythema, swelling, exudates. Oropharynx clear without erythema or exudates. Oral hygiene is good. Tongue normal, non obstructing. Hearing intact.  Neck: Supple. Thyroid nl. Car 2+/2+ without bruits, nodes or JVD. Chest: Respirations nl with BS clear & equal w/o rales, rhonchi, wheezing or stridor.  Cor: Heart sounds normal w/ regular rate and rhythm without sig. murmurs, gallops, clicks, or rubs. Peripheral pulses normal and equal  without edema.  Abdomen: Soft & bowel sounds normal. Non-tender w/o guarding, rebound, hernias, masses, or organomegaly.  Lymphatics: Unremarkable.  Musculoskeletal: Full ROM all peripheral extremities, joint stability, 5/5 strength, and normal gait.  Skin: Warm, dry without exposed rashes, lesions, ecchymosis apparent.  Neuro: Cranial nerves intact, reflexes equal bilaterally. Sensory-motor testing grossly intact. Tendon reflexes grossly intact.  Pysch: Alert & oriented x 3. Insight and judgement nl & appropriate. No ideations.  Assessment and Plan:  1. Hypertension - Continue monitor blood pressure at home. Continue diet/meds same.  2. Hyperlipidemia - Continue diet/meds, exercise,& lifestyle modifications. Continue monitor periodic cholesterol/liver & renal functions   3. Pre-diabetes/Abnormal Glucose - Continue diet, exercise, lifestyle modifications. Monitor appropriate labs.  4. Vitamin D Deficiency - Continue supplementation.  5. Prostate Cancer - continue F/U with Dr Patsi Sears  Recommended regular exercise, BP monitoring, weight control, and discussed med and SE's. Recommended labs to assess and monitor clinical status. Further disposition pending results of labs.

## 2013-02-13 ENCOUNTER — Encounter: Payer: Self-pay | Admitting: Internal Medicine

## 2013-02-13 ENCOUNTER — Ambulatory Visit: Payer: Medicare Other | Admitting: Internal Medicine

## 2013-02-13 VITALS — BP 140/74 | HR 60 | Temp 97.5°F | Resp 18 | Wt 222.0 lb

## 2013-02-13 DIAGNOSIS — E782 Mixed hyperlipidemia: Secondary | ICD-10-CM

## 2013-02-13 DIAGNOSIS — I10 Essential (primary) hypertension: Secondary | ICD-10-CM | POA: Diagnosis not present

## 2013-02-13 DIAGNOSIS — N3000 Acute cystitis without hematuria: Secondary | ICD-10-CM

## 2013-02-13 DIAGNOSIS — E559 Vitamin D deficiency, unspecified: Secondary | ICD-10-CM | POA: Diagnosis not present

## 2013-02-13 DIAGNOSIS — Z79899 Other long term (current) drug therapy: Secondary | ICD-10-CM | POA: Diagnosis not present

## 2013-02-13 DIAGNOSIS — R7309 Other abnormal glucose: Secondary | ICD-10-CM

## 2013-02-13 LAB — BASIC METABOLIC PANEL WITH GFR
BUN: 25 mg/dL — ABNORMAL HIGH (ref 6–23)
CO2: 28 mEq/L (ref 19–32)
Calcium: 9.3 mg/dL (ref 8.4–10.5)
GFR, Est African American: 59 mL/min — ABNORMAL LOW
Glucose, Bld: 79 mg/dL (ref 70–99)
Sodium: 137 mEq/L (ref 135–145)

## 2013-02-13 LAB — HEPATIC FUNCTION PANEL
ALT: 21 U/L (ref 0–53)
AST: 21 U/L (ref 0–37)
Bilirubin, Direct: 0.1 mg/dL (ref 0.0–0.3)
Indirect Bilirubin: 0.6 mg/dL (ref 0.0–0.9)
Total Protein: 6.6 g/dL (ref 6.0–8.3)

## 2013-02-13 LAB — CBC WITH DIFFERENTIAL/PLATELET
Basophils Relative: 0 % (ref 0–1)
Eosinophils Absolute: 0.2 10*3/uL (ref 0.0–0.7)
HCT: 40 % (ref 39.0–52.0)
Hemoglobin: 13.5 g/dL (ref 13.0–17.0)
Lymphs Abs: 1.2 10*3/uL (ref 0.7–4.0)
MCH: 30 pg (ref 26.0–34.0)
MCHC: 33.8 g/dL (ref 30.0–36.0)
MCV: 88.9 fL (ref 78.0–100.0)
Monocytes Absolute: 0.5 10*3/uL (ref 0.1–1.0)
Monocytes Relative: 10 % (ref 3–12)
Neutrophils Relative %: 61 % (ref 43–77)
RBC: 4.5 MIL/uL (ref 4.22–5.81)

## 2013-02-13 LAB — MAGNESIUM: Magnesium: 1.9 mg/dL (ref 1.5–2.5)

## 2013-02-13 LAB — TSH: TSH: 3.341 u[IU]/mL (ref 0.350–4.500)

## 2013-02-13 MED ORDER — BISOPROLOL-HYDROCHLOROTHIAZIDE 5-6.25 MG PO TABS: ORAL_TABLET | ORAL | Status: DC

## 2013-02-14 LAB — URINALYSIS, MICROSCOPIC ONLY
Casts: NONE SEEN
Squamous Epithelial / LPF: NONE SEEN

## 2013-02-14 LAB — INSULIN, FASTING: Insulin fasting, serum: 10 u[IU]/mL (ref 3–28)

## 2013-05-17 ENCOUNTER — Ambulatory Visit: Payer: Medicare Other | Admitting: Emergency Medicine

## 2013-05-18 ENCOUNTER — Ambulatory Visit (INDEPENDENT_AMBULATORY_CARE_PROVIDER_SITE_OTHER): Payer: Medicare Other | Admitting: Emergency Medicine

## 2013-05-18 ENCOUNTER — Encounter: Payer: Self-pay | Admitting: Emergency Medicine

## 2013-05-18 VITALS — BP 130/64 | HR 68 | Temp 98.2°F | Resp 16 | Ht 74.0 in | Wt 205.0 lb

## 2013-05-18 DIAGNOSIS — E559 Vitamin D deficiency, unspecified: Secondary | ICD-10-CM

## 2013-05-18 DIAGNOSIS — Z789 Other specified health status: Secondary | ICD-10-CM

## 2013-05-18 DIAGNOSIS — Z0000 Encounter for general adult medical examination without abnormal findings: Secondary | ICD-10-CM

## 2013-05-18 DIAGNOSIS — E782 Mixed hyperlipidemia: Secondary | ICD-10-CM

## 2013-05-18 DIAGNOSIS — Z23 Encounter for immunization: Secondary | ICD-10-CM | POA: Diagnosis not present

## 2013-05-18 DIAGNOSIS — I10 Essential (primary) hypertension: Secondary | ICD-10-CM

## 2013-05-18 DIAGNOSIS — R7309 Other abnormal glucose: Secondary | ICD-10-CM

## 2013-05-18 DIAGNOSIS — Z1331 Encounter for screening for depression: Secondary | ICD-10-CM

## 2013-05-18 LAB — HEMOGLOBIN A1C
Hgb A1c MFr Bld: 5.2 % (ref <5.7)
Mean Plasma Glucose: 103 mg/dL (ref <117)

## 2013-05-18 LAB — CBC WITH DIFFERENTIAL/PLATELET
BASOS ABS: 0 10*3/uL (ref 0.0–0.1)
Basophils Relative: 1 % (ref 0–1)
EOS ABS: 0.2 10*3/uL (ref 0.0–0.7)
EOS PCT: 4 % (ref 0–5)
HCT: 38.9 % — ABNORMAL LOW (ref 39.0–52.0)
Hemoglobin: 13.2 g/dL (ref 13.0–17.0)
Lymphocytes Relative: 25 % (ref 12–46)
Lymphs Abs: 1.1 10*3/uL (ref 0.7–4.0)
MCH: 30.8 pg (ref 26.0–34.0)
MCHC: 33.9 g/dL (ref 30.0–36.0)
MCV: 90.7 fL (ref 78.0–100.0)
Monocytes Absolute: 0.6 10*3/uL (ref 0.1–1.0)
Monocytes Relative: 13 % — ABNORMAL HIGH (ref 3–12)
Neutro Abs: 2.5 10*3/uL (ref 1.7–7.7)
Neutrophils Relative %: 57 % (ref 43–77)
PLATELETS: 191 10*3/uL (ref 150–400)
RBC: 4.29 MIL/uL (ref 4.22–5.81)
RDW: 13.9 % (ref 11.5–15.5)
WBC: 4.4 10*3/uL (ref 4.0–10.5)

## 2013-05-18 MED ORDER — AZITHROMYCIN 250 MG PO TABS: ORAL_TABLET | ORAL | Status: AC

## 2013-05-18 MED ORDER — CIPROFLOXACIN HCL 500 MG PO TABS: 500.0000 mg | ORAL_TABLET | Freq: Two times a day (BID) | ORAL | Status: AC

## 2013-05-18 NOTE — Progress Notes (Signed)
Patient ID: Eugene Friedman, male   DOB: 10/03/1928, 80 y.o.   MRN: [id number] Subjective:  Eugene Friedman is a 80 y.o. male who presents for Medicare Annual Wellness Visit and 3 month follow up for HTN, hyperlipidemia, prediabetes history, and vitamin D Def.  Date of last medicare wellness visit was is unknown.  He is down 20 #with diet changes and exercise and oes not want to stay on Rx medications if possible. He notes BP has been low so he stopped Ziac which helped. He feel sgreat overall.  His blood pressure has been controlled at home, today their BP is BP: 130/64 mmHg He does workout. He denies chest pain, shortness of breath, dizziness.  He is not on cholesterol medication and denies myalgias. His cholesterol is at goal. The cholesterol last visit was:   Lab Results  Component Value Date   CHOL 201* 05/18/2013   He has been working on diet and exercise for prediabetes, and denies foot ulcerations, hyperglycemia, paresthesia of the feet and polyuria. Last A1C in the office was:  Lab Results  Component Value Date   HGBA1C 5.2 05/18/2013  Prior A1c 6.1  Patient is on Vitamin D supplement.   No components found with this basename: VITD25OH     Names of Other Physician/Practitioners you currently use: Patient Care Team: [name], MD as PCP - General (Internal Medicine) Linton Rump, MD as Consulting Physician (Ophthalmology) Thayer Headings, MD as Consulting Physician (Cardiology) Britta Mccreedy, (Dentist)  Medication Review: Current Outpatient Prescriptions on File Prior to Visit  Medication Sig Dispense Refill  . aspirin EC 81 MG tablet Take 81 mg by mouth daily.      Marland Kitchen doxazosin (CARDURA) 4 MG tablet Take 2 mg by mouth at bedtime.      Marland Kitchen FINASTERIDE PO Take 5 mg by mouth daily.       . hyoscyamine (LEVSIN, ANASPAZ) 0.125 MG tablet Take 0.125 mg by mouth every 6 (six) hours as needed.      . Ibuprofen (ADVIL) 200 MG CAPS Take 200 mg by mouth 2 (two) times daily. Takes 2 tabs  BID      . LUTEIN PO Take 5 mg by mouth 2 (two) times daily.      . tadalafil (CIALIS) 5 MG tablet Take 5 mg by mouth daily as needed for erectile dysfunction.       No current facility-administered medications on file prior to visit.   Allergies  Allergen Reactions  . Adhesive [Tape]   . Celebrex [Celecoxib] Other (See Comments)    GI upset  . Lipitor [Atorvastatin]   . Nickel   . Ppd [Tuberculin Purified Protein Derivative] Other (See Comments)    Positive PPD    Current Problems (verified) Patient Active Problem List   Diagnosis Date Noted  . Unspecified vitamin D deficiency 02/12/2013  . Other abnormal glucose 02/12/2013  . Elevated prostate specific antigen (PSA) 02/12/2013  . Mixed hyperlipidemia 02/12/2013  . Unspecified essential hypertension 02/12/2013  . Hypertension   . Hyperlipidemia   . Palpitations   . Sinus bradycardia     Screening Tests Health Maintenance  Topic Date Due  . Colonoscopy  02/17/2011  . Influenza Vaccine  09/16/2013  . Tetanus/tdap  02/16/2018  . Pneumococcal Polysaccharide Vaccine Age 59 And Over  Completed  . Zostavax  Completed    Immunization History  Administered Date(s) Administered  . H1N1 02/17/2007  . Influenza Whole 12/05/2012  . Influenza-Unspecified 12/05/2012  . Pneumococcal Polysaccharide-23  03/08/2008, 05/18/2013  . Tdap 02/17/2008  . Zoster 02/16/2005    Preventative care: Last colonoscopy:2003  Prior vaccinations: TD or Tdap: 2010  Influenza: 2014 Pneumococcal: 2010, 2015 Shingles/Zostavax: 2007  History reviewed:  Past Medical History  Diagnosis Date  . Hypertension   . Hyperlipidemia   . Palpitations   . Sinus bradycardia   . DJD (degenerative joint disease)   . Vitamin D deficiency   . Allergic rhinitis   . Cancer     prostate   Past Surgical History  Procedure Laterality Date  . Cervical neck mass   07/08/06    Left Excision left posterior cervical neck mass (2.5 cm). SURGEON:  Earnstine Regal, M.D., Allegra Grana 07/08/06 and 12/05/07  . Appendectomy    . Mastoidectomy Left   . Flexible sigmoidoscopy     History  Substance Use Topics  . Smoking status: Never Smoker   . Smokeless tobacco: Not on file  . Alcohol Use: 6.0 oz/week    10 Glasses of wine per week   Family History  Problem Relation Age of Onset  . Stroke Mother   . Diabetes Mother   . Heart attack Father   . Hypertension Father   . Diabetes Maternal Aunt   . Diabetes Paternal Aunt   . Cancer Maternal Grandmother      Risk Factors: Tobacco History  Substance Use Topics  . Smoking status: Never Smoker   . Smokeless tobacco: Not on file  . Alcohol Use: 6.0 oz/week    10 Glasses of wine per week   He does not smoke.  Patient is not a former smoker. Are there smokers in your home (other than you)?  No  Alcohol Current alcohol use: glass of wine with dinner  Caffeine Current caffeine use: coffee 1.5 /day  Exercise Current exercise habits: Home exercise routine includes walking .5 hrs per days.  Current exercise: housecleaning, walking, weightlifting and yard work  Nutrition/Diet Current diet: in general, a "healthy" diet    Cardiac risk factors: advanced age (older than 34 for men, 9 for women), dyslipidemia and hypertension.  Depression Screen Nurse depression screen reviewed.  (Note: if answer to either of the following is "Yes", a more complete depression screening is indicated)   Q1: Over the past two weeks, have you felt down, depressed or hopeless? No  Q2: Over the past two weeks, have you felt little interest or pleasure in doing things? No  Have you lost interest or pleasure in daily life? No  Do you often feel hopeless? No  Do you cry easily over simple problems? No  Activities of Daily Living Nurse ADLs screen reviewed.  In your present state of health, do you have any difficulty performing the following activities?:  Driving? No Managing money?  No Feeding yourself?  No Getting from bed to chair? No Climbing a flight of stairs? No Preparing food and eating?: No Bathing or showering? No Getting dressed: No Getting to the toilet? No Using the toilet:No Moving around from place to place: No In the past year have you fallen or had a near fall?:No   Are you sexually active?  Yes  Do you have more than one partner?  No  Vision Difficulties: No  Hearing Difficulties: No Do you often ask people to speak up or repeat themselves? No Do you experience ringing or noises in your ears? No Do you have difficulty understanding soft or whispered voices? No  Cognition  Do you feel that you have  a problem with memory?No  Do you often misplace items? No  Do you feel safe at home?  Yes  Advanced directives Does patient have a [place]? Yes, Daughter [name] Does patient have a Living Will? Yes   Objective:     Vision and hearing screens reviewed.   Blood pressure 130/64, pulse 68, temperature 98.2 F (36.8 C), temperature source Temporal, resp. rate 16, height 6\' 2"  (1.88 m), weight 205 lb (92.987 kg). Body mass index is 26.31 kg/(m^2).  General appearance: alert, no distress, WD/WN, male Cognitive Testing  Alert? Yes  Normal Appearance?Yes  Oriented to person? Yes  Place? Yes   Time? Yes  Recall of three objects?  Yes  Can perform simple calculations? Yes  Displays appropriate judgment?Yes  Can read the correct time from a watch face?Yes  HEENT: normocephalic, sclerae anicteric/ mildly injected left, TMs pearly, nares patent, no discharge or erythema, pharynx normal Oral cavity: MMM, no lesions. Mild irritation arund left eye Neck: supple, no lymphadenopathy, no thyromegaly, no masses Heart: RRR, normal S1, S2, no murmurs Lungs: CTA bilaterally, no wheezes, rhonchi, or rales Abdomen: +bs, soft, non tender, non distended, no masses, no hepatomegaly, no splenomegaly Musculoskeletal: nontender, no swelling, no obvious  deformity Skin: WNL exposed areas Extremities: no edema, no cyanosis, no clubbing Pulses: 2+ symmetric, upper and lower extremities, normal cap refill Neurological: alert, oriented x 3, CN2-12 intact, strength normal upper extremities and lower extremities, sensation normal throughout, DTRs 2+ throughout, no cerebellar signs, gait normal Psychiatric: normal affect, behavior normal, pleasant   Assessment:  1.  3 month F/U for HTN, Cholesterol, Pre-Dm, D. Deficient. Needs healthy diet, cardio QD and obtain healthy weight. Check Labs, Check BP if >130/80 call office  2.Left eye irritation vs allergies- Advised F/u [facility] before leaves country to get eye re-eval  * travel medications Cipro 500 mg AD, Zpak AD.  Plan:   During the course of the visit the patient was educated and counseled about appropriate screening and preventive services including:    Pneumococcal vaccine   Diabetes screening  Nutrition counseling   Screening recommendations, referrals: Vaccinations: Tdap vaccine not done  /up to date Influenza vaccine not done  /up to date Pneumococcal vaccine yes Shingles vaccine not done  /up to date  Hep B vaccine not done   Nutrition assessed and recommended  Colonoscopy no Recommended yearly ophthalmology/optometry visit for glaucoma screening and checkup yes Recommended yearly dental visit for hygiene and checkup yes Advanced directives - yes  Conditions/risks identified: BMI: Discussed weight loss, diet, and increase physical activity.  Increase physical activity: AHA recommends 150 minutes of physical activity a week.  Medications reviewed Diabetes is at goal, ACE/ARB therapy: No, Reason not on Ace Inhibitor/ARB therapy:  NOt a Diabetic Urinary Incontinence is not an issue: discussed non pharmacology and pharmacology options.  Fall risk: low- discussed PT, home fall assessment, medications.    Medicare Attestation I have personally reviewed: The patient's medical  and social history Their use of alcohol, tobacco or illicit drugs Their current medications and supplements The patient's functional ability including ADLs,fall risks, home safety risks, cognitive, and hearing and visual impairment Diet and physical activities Evidence for depression or mood disorders  The patient's weight, height, BMI, and visual acuity have been recorded in the chart.  I have made referrals, counseling, and provided education to the patient based on review of the above and I have provided the patient with a written personalized care plan for preventive  services.     Kelby Aline, R, PA-C   05/22/2013    CPT S5681 first AWV CPT [phone] subsequent AWV

## 2013-05-18 NOTE — Patient Instructions (Signed)

## 2013-05-19 LAB — LIPID PANEL
CHOL/HDL RATIO: 2.6 ratio
CHOLESTEROL: 201 mg/dL — AB (ref 0–200)
HDL: 76 mg/dL (ref >39)
LDL Cholesterol: 100 mg/dL — ABNORMAL HIGH (ref 0–99)
TRIGLYCERIDES: 125 mg/dL (ref <150)
VLDL: 25 mg/dL (ref 0–40)

## 2013-05-19 LAB — HEPATIC FUNCTION PANEL
ALT: 18 U/L (ref 0–53)
AST: 23 U/L (ref 0–37)
Albumin: 4.3 g/dL (ref 3.5–5.2)
Alkaline Phosphatase: 63 U/L (ref 39–117)
BILIRUBIN DIRECT: 0.1 mg/dL (ref 0.0–0.3)
BILIRUBIN TOTAL: 0.7 mg/dL (ref 0.2–1.2)
Indirect Bilirubin: 0.6 mg/dL (ref 0.2–1.2)
Total Protein: 6.7 g/dL (ref 6.0–8.3)

## 2013-05-19 LAB — BASIC METABOLIC PANEL WITH GFR
BUN: 24 mg/dL — AB (ref 6–23)
CALCIUM: 9 mg/dL (ref 8.4–10.5)
CO2: 25 mEq/L (ref 19–32)
CREATININE: 1.21 mg/dL (ref 0.50–1.35)
Chloride: 103 mEq/L (ref 96–112)
GFR, EST AFRICAN AMERICAN: 64 mL/min
GFR, EST NON AFRICAN AMERICAN: 55 mL/min — AB
Glucose, Bld: 78 mg/dL (ref 70–99)
Potassium: 4.2 mEq/L (ref 3.5–5.3)
Sodium: 136 mEq/L (ref 135–145)

## 2013-05-19 LAB — MAGNESIUM: Magnesium: 1.9 mg/dL (ref 1.5–2.5)

## 2013-05-19 LAB — VITAMIN D 25 HYDROXY (VIT D DEFICIENCY, FRACTURES): Vit D, 25-Hydroxy: 67 ng/mL (ref 30–89)

## 2013-05-23 ENCOUNTER — Other Ambulatory Visit: Payer: Self-pay | Admitting: *Deleted

## 2013-05-23 MED ORDER — DOXAZOSIN MESYLATE 4 MG PO TABS: 4.0000 mg | ORAL_TABLET | Freq: Every day | ORAL | Status: DC

## 2013-05-23 MED ORDER — TADALAFIL 5 MG PO TABS: 5.0000 mg | ORAL_TABLET | Freq: Every day | ORAL | Status: DC | PRN

## 2013-05-23 MED ORDER — FINASTERIDE 5 MG PO TABS: 5.0000 mg | ORAL_TABLET | Freq: Every day | ORAL | Status: DC

## 2013-05-25 DIAGNOSIS — H353190 Nonexudative age-related macular degeneration, unspecified eye, stage unspecified: Secondary | ICD-10-CM | POA: Diagnosis not present

## 2013-05-26 ENCOUNTER — Other Ambulatory Visit: Payer: Self-pay | Admitting: *Deleted

## 2013-05-26 DIAGNOSIS — H43819 Vitreous degeneration, unspecified eye: Secondary | ICD-10-CM | POA: Diagnosis not present

## 2013-05-26 DIAGNOSIS — H33329 Round hole, unspecified eye: Secondary | ICD-10-CM | POA: Diagnosis not present

## 2013-05-26 DIAGNOSIS — H35379 Puckering of macula, unspecified eye: Secondary | ICD-10-CM | POA: Diagnosis not present

## 2013-05-26 DIAGNOSIS — H353190 Nonexudative age-related macular degeneration, unspecified eye, stage unspecified: Secondary | ICD-10-CM | POA: Diagnosis not present

## 2013-05-26 MED ORDER — DOXAZOSIN MESYLATE 4 MG PO TABS: 4.0000 mg | ORAL_TABLET | Freq: Every day | ORAL | Status: DC

## 2013-07-17 DIAGNOSIS — H353190 Nonexudative age-related macular degeneration, unspecified eye, stage unspecified: Secondary | ICD-10-CM | POA: Diagnosis not present

## 2013-07-17 DIAGNOSIS — H43819 Vitreous degeneration, unspecified eye: Secondary | ICD-10-CM | POA: Diagnosis not present

## 2013-07-17 DIAGNOSIS — H35379 Puckering of macula, unspecified eye: Secondary | ICD-10-CM | POA: Diagnosis not present

## 2013-07-24 DIAGNOSIS — C61 Malignant neoplasm of prostate: Secondary | ICD-10-CM | POA: Diagnosis not present

## 2013-07-27 ENCOUNTER — Encounter: Payer: Self-pay | Admitting: Internal Medicine

## 2013-07-31 DIAGNOSIS — C61 Malignant neoplasm of prostate: Secondary | ICD-10-CM | POA: Diagnosis not present

## 2013-07-31 DIAGNOSIS — R39198 Other difficulties with micturition: Secondary | ICD-10-CM | POA: Diagnosis not present

## 2013-07-31 DIAGNOSIS — N529 Male erectile dysfunction, unspecified: Secondary | ICD-10-CM | POA: Diagnosis not present

## 2013-08-07 ENCOUNTER — Encounter: Payer: Self-pay | Admitting: Emergency Medicine

## 2013-08-07 ENCOUNTER — Ambulatory Visit (INDEPENDENT_AMBULATORY_CARE_PROVIDER_SITE_OTHER): Payer: Medicare Other | Admitting: Emergency Medicine

## 2013-08-07 VITALS — BP 136/70 | HR 66 | Temp 98.2°F | Resp 18 | Ht 74.5 in | Wt 200.0 lb

## 2013-08-07 DIAGNOSIS — M25552 Pain in left hip: Secondary | ICD-10-CM

## 2013-08-07 DIAGNOSIS — I10 Essential (primary) hypertension: Secondary | ICD-10-CM | POA: Diagnosis not present

## 2013-08-07 DIAGNOSIS — M25559 Pain in unspecified hip: Secondary | ICD-10-CM

## 2013-08-07 MED ORDER — FINASTERIDE 5 MG PO TABS: 5.0000 mg | ORAL_TABLET | Freq: Every day | ORAL | Status: DC

## 2013-08-07 MED ORDER — DOXAZOSIN MESYLATE 4 MG PO TABS: 4.0000 mg | ORAL_TABLET | Freq: Every day | ORAL | Status: DC

## 2013-08-07 NOTE — Patient Instructions (Signed)

## 2013-08-07 NOTE — Progress Notes (Signed)
Subjective:    Patient ID: Eugene Friedman, male    DOB: June 09, 1928, 80 y.o.   MRN: [id number]  HPI Comments: 80 yo WM needs RX written due to difficulty with insurance with Finasteride/ Doxazsoin/ Cialis. He notes they have delayed RX and he will pay out of pocket due to his need for Rx before leaving the country on Wed. He notes + improvement with BPH symptoms with Cialis 5 mg QD. He notes BP is staying a little better too. 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  He notes mild left hip pain since last week with sailing x several days. He denies actual injury or strain but notes feels muscular not like his arthritis pains. He is concerned it may be a hernia. He denies bowel concerns/ changes or unusual masses.     Medication List       This list is accurate as of: 08/07/13  4:14 PM.  Always use your most recent med list.               ADVIL 200 MG Caps  Generic drug:  Ibuprofen  Take 200 mg by mouth 2 (two) times daily. Takes 2 tabs BID     aspirin EC 81 MG tablet  Take 81 mg by mouth daily.     doxazosin 4 MG tablet  Commonly known as:  CARDURA  Take 1 tablet (4 mg total) by mouth daily.     finasteride 5 MG tablet  Commonly known as:  PROSCAR  Take 1 tablet (5 mg total) by mouth daily.     hyoscyamine 0.125 MG tablet  Commonly known as:  LEVSIN, ANASPAZ  Take 0.125 mg by mouth every 6 (six) hours as needed.     Krill Oil 1000 MG Caps  Take 1,000 mg by mouth daily.     LUTEIN PO  Take 5 mg by mouth 2 (two) times daily.     tadalafil 5 MG tablet  Commonly known as:  CIALIS  Take 1 tablet (5 mg total) by mouth daily as needed for erectile dysfunction.     Vitamin D-3 5000 UNITS Tabs  Take by mouth.       Allergies  Allergen Reactions  . Adhesive [Tape]   . Celebrex [Celecoxib] Other (See Comments)    GI upset  . Lipitor [Atorvastatin]   . Nickel   . Ppd [Tuberculin Purified Protein Derivative] Other (See Comments)     Positive PPD   Past Medical History  Diagnosis Date  . Hypertension   . Hyperlipidemia   . Palpitations   . Sinus bradycardia   . DJD (degenerative joint disease)   . Vitamin D deficiency   . Allergic rhinitis   . Cancer     prostate      Review of Systems  Musculoskeletal: Positive for arthralgias.  All other systems reviewed and are negative.  BP 136/70  Pulse 66  Temp(Src) 98.2 F (36.8 C) (Temporal)  Resp 18  Ht 6' 2.5" (1.892 m)  Wt 200 lb (90.719 kg)  BMI 25.34 kg/m2     Objective:   Physical Exam  Nursing note and vitals reviewed. Constitutional: He is oriented to person, place, and time. He appears well-developed and well-nourished.  HENT:  Head: Normocephalic and atraumatic.  Right Ear: External ear normal.  Left Ear: External ear normal.  Nose: Nose normal.  Mouth/Throat: Oropharynx is clear and moist. No oropharyngeal exudate.  Eyes: Conjunctivae are  normal.  Neck: Normal range of motion.  Cardiovascular: Normal rate, regular rhythm, normal heart sounds and intact distal pulses.   Pulmonary/Chest: Effort normal and breath sounds normal.  Abdominal: Soft. Bowel sounds are normal. He exhibits no distension and no mass. There is no tenderness.  Musculoskeletal: Normal range of motion.  Lymphadenopathy:    He has no cervical adenopathy.  Neurological: He is alert and oriented to person, place, and time. He has normal reflexes. No cranial nerve deficit. Coordination normal.  Skin: Skin is warm and dry.  Psychiatric: He has a normal mood and affect. His behavior is normal. Judgment normal.          Assessment & Plan:  1. HTN- Check BP call if >130/80, increase cardio- Lisinopril 10 if BP >130s  2. Left hip pain-? Strain vs concern for hernia- advised of stretches and f/u if no change, NO obvious hernia but information given   3. BPH/ ED- Cialis 5 mg 1 QD #90 SX

## 2013-08-08 ENCOUNTER — Ambulatory Visit: Payer: Self-pay | Admitting: Internal Medicine

## 2013-11-13 DIAGNOSIS — H2510 Age-related nuclear cataract, unspecified eye: Secondary | ICD-10-CM | POA: Diagnosis not present

## 2013-11-13 DIAGNOSIS — H40019 Open angle with borderline findings, low risk, unspecified eye: Secondary | ICD-10-CM | POA: Diagnosis not present

## 2013-11-14 ENCOUNTER — Encounter: Payer: Self-pay | Admitting: Internal Medicine

## 2013-11-14 ENCOUNTER — Ambulatory Visit (INDEPENDENT_AMBULATORY_CARE_PROVIDER_SITE_OTHER): Payer: Medicare Other | Admitting: Internal Medicine

## 2013-11-14 VITALS — BP 120/78 | HR 64 | Temp 98.2°F | Resp 16 | Ht 74.5 in | Wt 203.4 lb

## 2013-11-14 DIAGNOSIS — Z23 Encounter for immunization: Secondary | ICD-10-CM | POA: Diagnosis not present

## 2013-11-14 DIAGNOSIS — Z1331 Encounter for screening for depression: Secondary | ICD-10-CM

## 2013-11-14 DIAGNOSIS — I10 Essential (primary) hypertension: Secondary | ICD-10-CM | POA: Diagnosis not present

## 2013-11-14 DIAGNOSIS — R7309 Other abnormal glucose: Secondary | ICD-10-CM

## 2013-11-14 DIAGNOSIS — Z79899 Other long term (current) drug therapy: Secondary | ICD-10-CM | POA: Diagnosis not present

## 2013-11-14 DIAGNOSIS — Z0000 Encounter for general adult medical examination without abnormal findings: Secondary | ICD-10-CM

## 2013-11-14 DIAGNOSIS — E785 Hyperlipidemia, unspecified: Secondary | ICD-10-CM

## 2013-11-14 DIAGNOSIS — Z789 Other specified health status: Secondary | ICD-10-CM

## 2013-11-14 DIAGNOSIS — R972 Elevated prostate specific antigen [PSA]: Secondary | ICD-10-CM

## 2013-11-14 DIAGNOSIS — E559 Vitamin D deficiency, unspecified: Secondary | ICD-10-CM | POA: Diagnosis not present

## 2013-11-14 DIAGNOSIS — Z1212 Encounter for screening for malignant neoplasm of rectum: Secondary | ICD-10-CM

## 2013-11-14 LAB — CBC WITH DIFFERENTIAL/PLATELET
BASOS PCT: 0 % (ref 0–1)
Basophils Absolute: 0 10*3/uL (ref 0.0–0.1)
EOS ABS: 0.1 10*3/uL (ref 0.0–0.7)
EOS PCT: 3 % (ref 0–5)
HEMATOCRIT: 39.7 % (ref 39.0–52.0)
Hemoglobin: 13.2 g/dL (ref 13.0–17.0)
Lymphocytes Relative: 22 % (ref 12–46)
Lymphs Abs: 1.1 10*3/uL (ref 0.7–4.0)
MCH: 30.3 pg (ref 26.0–34.0)
MCHC: 33.2 g/dL (ref 30.0–36.0)
MCV: 91.1 fL (ref 78.0–100.0)
Monocytes Absolute: 0.5 10*3/uL (ref 0.1–1.0)
Monocytes Relative: 11 % (ref 3–12)
NEUTROS PCT: 64 % (ref 43–77)
Neutro Abs: 3.1 10*3/uL (ref 1.7–7.7)
Platelets: 193 10*3/uL (ref 150–400)
RBC: 4.36 MIL/uL (ref 4.22–5.81)
RDW: 13.7 % (ref 11.5–15.5)
WBC: 4.8 10*3/uL (ref 4.0–10.5)

## 2013-11-14 NOTE — Progress Notes (Signed)
Patient ID: Eugene Friedman, male   DOB: 1928/10/18, 80 y.o.   MRN: [id number]  Annual Screening/Preventative  Comprehensive Examination  This very nice 80 y.o.WWM presents for complete physical.  Patient has been followed for HTN,   Prediabetes, Hyperlipidemia, and Vitamin D Deficiency. Patient is being followed by Dr Gaynelle Arabian for indolent Prostate Cancer by watchful waiting.    HTN predates since 2001. Patient's BP has been controlled at home on Doxazosin concomitantly  treating BP and Prostatism..Today's BP: 120/78 mmHg. Patient had Neg Cardiolite in 2011 by Dr Cathie Olden. Patient denies any cardiac symptoms as chest pain, palpitations, shortness of breath, dizziness or ankle swelling.   Patient's hyperlipidemia is controlled with diet and medications. Patient denies myalgias or other medication SE's. Last lipids were Total Chol  201*; HDL Chol 76; LDL 100*; Trig125 on 05/18/2013.   Patient is screened for prediabetes and patient denies reactive hypoglycemic symptoms, visual blurring, diabetic polys or paresthesias. Last A1c was  5.2% on 05/18/2013.   Finally, patient has history of Vitamin D Deficiency of 42 in 2009 and last vitamin D was  67 on 05/18/2013.  Medication Sig  . aspirin EC 81 MG tablet Take 81 mg by mouth daily.  Marland Kitchen VITAMIN D 5000 U Take by mouth.  . doxazosin  4 MG tablet Take 1 tablet (4 mg total) by mouth daily.  . Finasteride 5 MG tablet Take 1 tablet (5 mg total) by mouth daily.  . hyoscyamine 0.125 MG tablet Take 0.125 mg by mouth every 6 (six) hours as needed.  . Ibuprofen  200 MG  Take 200 mg by mouth 2 (two) times daily. Takes 2 tabs BID  . Krill Oil 1000 MG Take 1,000 mg by mouth daily.  . LUTEIN  Take 5 mg by mouth 2 (two) times daily.  . tadalafil (CIALIS) 5 MG tablet Take 1 tablet (5 mg total) by mouth daily as needed    Allergies  Allergen Reactions  . Adhesive [Tape]   . Celebrex [Celecoxib] Other (See Comments)    GI upset  . Lipitor [Atorvastatin]   . Nickel    . Ppd [Tuberculin Purified Protein Derivative] Other (See Comments)    Positive PPD    Past Medical History  Diagnosis Date  . Hypertension   . Hyperlipidemia   . Palpitations   . Sinus bradycardia   . DJD (degenerative joint disease)   . Vitamin D deficiency   . Allergic rhinitis   . Cancer     prostate   Past Surgical History  Procedure Laterality Date  . Cervical neck mass   07/08/06    Left Excision left posterior cervical neck mass (2.5 cm). SURGEON:  Earnstine Regal, M.D., Allegra Grana 07/08/06 and 12/05/07  . Appendectomy    . Mastoidectomy Left   . Flexible sigmoidoscopy     Family History  Problem Relation Age of Onset  . Stroke Mother   . Diabetes Mother   . Heart attack Father   . Hypertension Father   . Diabetes Maternal Aunt   . Diabetes Paternal Aunt   . Cancer Maternal Grandmother    History   Social History  . Marital Status: Widowed    Spouse Name: N/A    Number of Children: N/A  . Years of Education: N/A   Occupational History  . 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 Smoking status: Never Smoker   . Smokeless tobacco: Not on  file  . Alcohol Use: 6.0 oz/week    10 Glasses of wine per week  . Drug Use: No  . Sexual Activity: Not on file    ROS Constitutional: Denies fever, chills, weight loss/gain, headaches, insomnia, fatigue, night sweats or change in appetite. Eyes: Denies redness, blurred vision, diplopia, discharge, itchy or watery eyes.  ENT: Denies discharge, congestion, post nasal drip, epistaxis, sore throat, earache, hearing loss, dental pain, Tinnitus, Vertigo, Sinus pain or snoring.  Cardio: Denies chest pain, palpitations, irregular heartbeat, syncope, dyspnea, diaphoresis, orthopnea, PND, claudication or edema Respiratory: denies cough, dyspnea, DOE, pleurisy, hoarseness, laryngitis or wheezing.  Gastrointestinal: Denies dysphagia, heartburn, reflux, water brash, pain, cramps, nausea, vomiting,  bloating, diarrhea, constipation, hematemesis, melena, hematochezia, jaundice or hemorrhoids Genitourinary: Denies dysuria, frequency, urgency, nocturia, hesitancy, discharge, hematuria or flank pain Musculoskeletal: Denies arthralgia, myalgia, stiffness, Jt. Swelling, pain, limp or strain/sprain. Denies Falls. Skin: Denies puritis, rash, hives, warts, acne, eczema or change in skin lesion Neuro: No weakness, tremor, incoordination, spasms, paresthesia or pain Psychiatric: Denies confusion, memory loss or sensory loss. Denies Depression. Endocrine: Denies change in weight, skin, hair change, nocturia, and paresthesia, diabetic polys, visual blurring or hyper / hypo glycemic episodes.  Heme/Lymph: No excessive bleeding, bruising or enlarged lymph nodes.  Physical Exam  BP 120/78  Pulse 64  Temp(Src) 98.2 F (36.8 C) (Temporal)  Resp 16  Ht 6' 2.5" (1.892 m)  Wt 203 lb 6.4 oz (92.262 kg)  BMI 25.77 kg/m2  General Appearance: Well nourished, in no apparent distress. Eyes: PERRLA, EOMs, conjunctiva no swelling or erythema, normal fundi and vessels. Sinuses: No frontal/maxillary tenderness ENT/Mouth: EACs patent / TMs  nl. Nares clear without erythema, swelling, mucoid exudates. Oral hygiene is good. No erythema, swelling, or exudate. Tongue normal, non-obstructing. Tonsils not swollen or erythematous. Hearing normal.  Neck: Supple, thyroid normal. No bruits, nodes or JVD. Respiratory: Respiratory effort normal.  BS equal and clear bilateral without rales, rhonci, wheezing or stridor. Cardio: Heart sounds are normal with regular rate and rhythm and no murmurs, rubs or gallops. Peripheral pulses are normal and equal bilaterally without edema. No aortic or femoral bruits. Chest: symmetric with normal excursions and percussion.  Abdomen: Flat, soft, with bowl sounds. Nontender, no guarding, rebound, hernias, masses, or organomegaly.  Lymphatics: Non tender without lymphadenopathy.   Genitourinary: No hernias.Testes nl. DRE - prostate nl for age - smooth & firm w/o nodules. Musculoskeletal: Full ROM all peripheral extremities, joint stability, 5/5 strength, and normal gait. Skin: Warm and dry without rashes, lesions, cyanosis, clubbing or  ecchymosis.  Neuro: Cranial nerves intact, reflexes equal bilaterally. Normal muscle tone, no cerebellar symptoms. Sensation intact.  Pysch: Awake and oriented X 3with normal affect, insight and judgment appropriate.   Assessment and Plan  1. Annual Screening Examination 2. Hypertension  3. Hyperlipidemia 4. Pre Diabetes 5. Vitamin D Deficiency 6. Prostate Cancer   Continue prudent diet as discussed, weight control, BP monitoring, regular exercise, and medications as discussed.  Discussed med effects and SE's. Routine screening labs and tests as requested with regular follow-up as recommended.

## 2013-11-14 NOTE — Patient Instructions (Signed)
Recommend the book "The END of DIETING" by Dr Baker Janus   and the book "The END of DIABETES " by Dr Excell Seltzer  At [email or web address] - get book & Audio CD's      Being diabetic has a  300% increased risk for heart attack, stroke, cancer, and alzheimer- type vascular dementia. It is very important that you work harder with diet by avoiding all foods that are white except chicken & fish. Avoid white rice (brown & wild rice is OK), white potatoes (sweetpotatoes in moderation is OK), White bread or wheat bread or anything made out of white flour like bagels, donuts, rolls, buns, biscuits, cakes, pastries, cookies, pizza crust, and pasta (made from white flour & egg whites) - vegetarian pasta or spinach or wheat pasta is OK. Multigrain breads like Arnold's or [employer], or multigrain sandwich thins or flatbreads.  Diet, exercise and weight loss can reverse and cure diabetes in the early stages.  Diet, exercise and weight loss is very important in the control and prevention of complications of diabetes which affects every system in your body, ie. Brain - dementia/stroke, eyes - glaucoma/blindness, heart - heart attack/heart failure, kidneys - dialysis, stomach - gastric paralysis, intestines - malabsorption, nerves - severe painful neuritis, circulation - gangrene & loss of a leg(s), and finally cancer and Alzheimers.    I recommend avoid fried & greasy foods,  sweets/candy, white rice (brown or wild rice or Quinoa is OK), white potatoes (sweet potatoes are OK) - anything made from white flour - bagels, doughnuts, rolls, buns, biscuits,white and wheat breads, pizza crust and traditional pasta made of white flour & egg white(vegetarian pasta or spinach or wheat pasta is OK).  Multi-grain bread is OK - like multi-grain flat bread or sandwich thins. Avoid alcohol in excess. Exercise is also important.    Eat all the vegetables you want - avoid meat, especially red meat and dairy - especially cheese.  Cheese  is the most concentrated form of trans-fats which is the worst thing to clog up our arteries. Veggie cheese is OK which can be found in the fresh produce section at [employer] or [employer] or [employer]  Preventive Care for Adults A healthy lifestyle and preventive care can promote health and wellness. Preventive health guidelines for men include the following key practices:  A routine yearly physical is a good way to check with your health care provider about your health and preventative screening. It is a chance to share any concerns and updates on your health and to receive a thorough exam.  Visit your dentist for a routine exam and preventative care every 6 months. Brush your teeth twice a day and floss once a day. Good oral hygiene prevents tooth decay and gum disease.  The frequency of eye exams is based on your age, health, family medical history, use of contact lenses, and other factors. Follow your health care provider's recommendations for frequency of eye exams.  Eat a healthy diet. Foods such as vegetables, fruits, whole grains, low-fat dairy products, and lean protein foods contain the nutrients you need without too many calories. Decrease your intake of foods high in solid fats, added sugars, and salt. Eat the right amount of calories for you.Get information about a proper diet from your health care provider, if necessary.  Regular physical exercise is one of the most important things you can do for your health. Most adults should get at least 150 minutes of moderate-intensity exercise (any activity that  increases your heart rate and causes you to sweat) each week. In addition, most adults need muscle-strengthening exercises on 2 or more days a week.  Maintain a healthy weight. The body mass index (BMI) is a screening tool to identify possible weight problems. It provides an estimate of body fat based on height and weight. Your health care provider can find your BMI and can help you  achieve or maintain a healthy weight.For adults 20 years and older:  A BMI below 18.5 is considered underweight.  A BMI of 18.5 to 24.9 is normal.  A BMI of 25 to 29.9 is considered overweight.  A BMI of 30 and above is considered obese.  Maintain normal blood lipids and cholesterol levels by exercising and minimizing your intake of saturated fat. Eat a balanced diet with plenty of fruit and vegetables. Blood tests for lipids and cholesterol should begin at age 20 and be repeated every 5 years. If your lipid or cholesterol levels are high, you are over 50, or you are at high risk for heart disease, you may need your cholesterol levels checked more frequently.Ongoing high lipid and cholesterol levels should be treated with medicines if diet and exercise are not working.  If you smoke, find out from your health care provider how to quit. If you do not use tobacco, do not start.  Lung cancer screening is recommended for adults aged 72-80 years who are at high risk for developing lung cancer because of a history of smoking. A yearly low-dose CT scan of the lungs is recommended for people who have at least a 30-pack-year history of smoking and are a current smoker or have quit within the past 15 years. A pack year of smoking is smoking an average of 1 pack of cigarettes a day for 1 year (for example: 1 pack a day for 30 years or 2 packs a day for 15 years). Yearly screening should continue until the smoker has stopped smoking for at least 15 years. Yearly screening should be stopped for people who develop a health problem that would prevent them from having lung cancer treatment.  If you choose to drink alcohol, do not have more than 2 drinks per day. One drink is considered to be 12 ounces (355 mL) of beer, 5 ounces (148 mL) of wine, or 1.5 ounces (44 mL) of liquor.  Avoid use of street drugs. Do not share needles with anyone. Ask for help if you need support or instructions about stopping the use of  drugs.  High blood pressure causes heart disease and increases the risk of stroke. Your blood pressure should be checked at least every 1-2 years. Ongoing high blood pressure should be treated with medicines, if weight loss and exercise are not effective.  If you are 28-64 years old, ask your health care provider if you should take aspirin to prevent heart disease.  Diabetes screening involves taking a blood sample to check your fasting blood sugar level. This should be done once every 3 years, after age 13, if you are within normal weight and without risk factors for diabetes. Testing should be considered at a younger age or be carried out more frequently if you are overweight and have at least 1 risk factor for diabetes.  Colorectal cancer can be detected and often prevented. Most routine colorectal cancer screening begins at the age of 78 and continues through age 56. However, your health care provider may recommend screening at an earlier age if you have risk  factors for colon cancer. On a yearly basis, your health care provider may provide home test kits to check for hidden blood in the stool. Use of a small camera at the end of a tube to directly examine the colon (sigmoidoscopy or colonoscopy) can detect the earliest forms of colorectal cancer. Talk to your health care provider about this at age 48, when routine screening begins. Direct exam of the colon should be repeated every 5-10 years through age 60, unless early forms of precancerous polyps or small growths are found.  People who are at an increased risk for hepatitis B should be screened for this virus. You are considered at high risk for hepatitis B if:  You were born in a country where hepatitis B occurs often. Talk with your health care provider about which countries are considered high risk.  Your parents were born in a high-risk country and you have not received a shot to protect against hepatitis B (hepatitis B vaccine).  You have  HIV or AIDS.  You use needles to inject street drugs.  You live with, or have sex with, someone who has hepatitis B.  You are a man who has sex with other men (MSM).  You get hemodialysis treatment.  You take certain medicines for conditions such as cancer, organ transplantation, and autoimmune conditions.  Hepatitis C blood testing is recommended for all people born from [birth year] through 1965 and any individual with known risks for hepatitis C.  Practice safe sex. Use condoms and avoid high-risk sexual practices to reduce the spread of sexually transmitted infections (STIs). STIs include gonorrhea, chlamydia, syphilis, trichomonas, herpes, HPV, and human immunodeficiency virus (HIV). Herpes, HIV, and HPV are viral illnesses that have no cure. They can result in disability, cancer, and death.  If you are at risk of being infected with HIV, it is recommended that you take a prescription medicine daily to prevent HIV infection. This is called preexposure prophylaxis (PrEP). You are considered at risk if:  You are a man who has sex with other men (MSM) and have other risk factors.  You are a heterosexual man, are sexually active, and are at increased risk for HIV infection.  You take drugs by injection.  You are sexually active with a partner who has HIV.  Talk with your health care provider about whether you are at high risk of being infected with HIV. If you choose to begin PrEP, you should first be tested for HIV. You should then be tested every 3 months for as long as you are taking PrEP.  A one-time screening for abdominal aortic aneurysm (AAA) and surgical repair of large AAAs by ultrasound are recommended for men ages 51 to 11 years who are current or former smokers.  Healthy men should no longer receive prostate-specific antigen (PSA) blood tests as part of routine cancer screening. Talk with your health care provider about prostate cancer screening.  Testicular cancer screening is  not recommended for adult males who have no symptoms. Screening includes self-exam, a health care provider exam, and other screening tests. Consult with your health care provider about any symptoms you have or any concerns you have about testicular cancer.  Use sunscreen. Apply sunscreen liberally and repeatedly throughout the day. You should seek shade when your shadow is shorter than you. Protect yourself by wearing long sleeves, pants, a wide-brimmed hat, and sunglasses year round, whenever you are outdoors.  Once a month, do a whole-body skin exam, using a mirror to look  at the skin on your back. Tell your health care provider about new moles, moles that have irregular borders, moles that are larger than a pencil eraser, or moles that have changed in shape or color.  Stay current with required vaccines (immunizations).  Influenza vaccine. All adults should be immunized every year.  Tetanus, diphtheria, and acellular pertussis (Td, Tdap) vaccine. An adult who has not previously received Tdap or who does not know his vaccine status should receive 1 dose of Tdap. This initial dose should be followed by tetanus and diphtheria toxoids (Td) booster doses every 10 years. Adults with an unknown or incomplete history of completing a 3-dose immunization series with Td-containing vaccines should begin or complete a primary immunization series including a Tdap dose. Adults should receive a Td booster every 10 years.  Varicella vaccine. An adult without evidence of immunity to varicella should receive 2 doses or a second dose if he has previously received 1 dose.  Human papillomavirus (HPV) vaccine. Males aged 29-21 years who have not received the vaccine previously should receive the 3-dose series. Males aged 22-26 years may be immunized. Immunization is recommended through the age of 26 years for any male who has sex with males and did not get any or all doses earlier. Immunization is recommended for any  person with an immunocompromised condition through the age of 29 years if he did not get any or all doses earlier. During the 3-dose series, the second dose should be obtained 4-8 weeks after the first dose. The third dose should be obtained 24 weeks after the first dose and 16 weeks after the second dose.  Zoster vaccine. One dose is recommended for adults aged 38 years or older unless certain conditions are present.  Measles, mumps, and rubella (MMR) vaccine. Adults born before [birth year] generally are considered immune to measles and mumps. Adults born in [birth year] or later should have 1 or more doses of MMR vaccine unless there is a contraindication to the vaccine or there is laboratory evidence of immunity to each of the three diseases. A routine second dose of MMR vaccine should be obtained at least 28 days after the first dose for students attending postsecondary schools, health care workers, or international travelers. People who received inactivated measles vaccine or an unknown type of measles vaccine during 1963-1967 should receive 2 doses of MMR vaccine. People who received inactivated mumps vaccine or an unknown type of mumps vaccine before 1979 and are at high risk for mumps infection should consider immunization with 2 doses of MMR vaccine. Unvaccinated health care workers born before [birth year] who lack laboratory evidence of measles, mumps, or rubella immunity or laboratory confirmation of disease should consider measles and mumps immunization with 2 doses of MMR vaccine or rubella immunization with 1 dose of MMR vaccine.  Pneumococcal 13-valent conjugate (PCV13) vaccine. When indicated, a person who is uncertain of his immunization history and has no record of immunization should receive the PCV13 vaccine. An adult aged 68 years or older who has certain medical conditions and has not been previously immunized should receive 1 dose of PCV13 vaccine. This PCV13 should be followed with a dose of pneumococcal  polysaccharide (PPSV23) vaccine. The PPSV23 vaccine dose should be obtained at least 8 weeks after the dose of PCV13 vaccine. An adult aged 95 years or older who has certain medical conditions and previously received 1 or more doses of PPSV23 vaccine should receive 1 dose of PCV13. The PCV13 vaccine dose should be obtained 1  or more years after the last PPSV23 vaccine dose.  Pneumococcal polysaccharide (PPSV23) vaccine. When PCV13 is also indicated, PCV13 should be obtained first. All adults aged 40 years and older should be immunized. An adult younger than age 41 years who has certain medical conditions should be immunized. Any person who resides in a nursing home or long-term care facility should be immunized. An adult smoker should be immunized. People with an immunocompromised condition and certain other conditions should receive both PCV13 and PPSV23 vaccines. People with human immunodeficiency virus (HIV) infection should be immunized as soon as possible after diagnosis. Immunization during chemotherapy or radiation therapy should be avoided. Routine use of PPSV23 vaccine is not recommended for American Indians, [place] Natives, or people younger than 65 years unless there are medical conditions that require PPSV23 vaccine. When indicated, people who have unknown immunization and have no record of immunization should receive PPSV23 vaccine. One-time revaccination 5 years after the first dose of PPSV23 is recommended for people aged 19-64 years who have chronic kidney failure, nephrotic syndrome, asplenia, or immunocompromised conditions. People who received 1-2 doses of PPSV23 before age 47 years should receive another dose of PPSV23 vaccine at age 37 years or later if at least 5 years have passed since the previous dose. Doses of PPSV23 are not needed for people immunized with PPSV23 at or after age 68 years.  Meningococcal vaccine. Adults with asplenia or persistent complement component deficiencies  should receive 2 doses of quadrivalent meningococcal conjugate (MenACWY-D) vaccine. The doses should be obtained at least 2 months apart. Microbiologists working with certain meningococcal bacteria, [employer] recruits, people at risk during an outbreak, and people who travel to or live in countries with a high rate of meningitis should be immunized. A first-year college student up through age 49 years who is living in a residence hall should receive a dose if he did not receive a dose on or after his 16th birthday. Adults who have certain high-risk conditions should receive one or more doses of vaccine.  Hepatitis A vaccine. Adults who wish to be protected from this disease, have certain high-risk conditions, work with hepatitis A-infected animals, work in hepatitis A research labs, or travel to or work in countries with a high rate of hepatitis A should be immunized. Adults who were previously unvaccinated and who anticipate close contact with an international adoptee during the first 60 days after arrival in the [place] from a country with a high rate of hepatitis A should be immunized.  Hepatitis B vaccine. Adults should be immunized if they wish to be protected from this disease, have certain high-risk conditions, may be exposed to blood or other infectious body fluids, are household contacts or sex partners of hepatitis B positive people, are clients or workers in certain care facilities, or travel to or work in countries with a high rate of hepatitis B.  Haemophilus influenzae type b (Hib) vaccine. A previously unvaccinated person with asplenia or sickle cell disease or having a scheduled splenectomy should receive 1 dose of Hib vaccine. Regardless of previous immunization, a recipient of a hematopoietic stem cell transplant should receive a 3-dose series 6-12 months after his successful transplant. Hib vaccine is not recommended for adults with HIV infection. Preventive Service /  Frequency   Ages 42 and over  Blood pressure check.** / Every 1 to 2 years.  Lipid and cholesterol check.**/ Every 5 years beginning at age 66.  Lung cancer screening. / Every year if you are aged  are aged 55-80 years and have a 30-pack-year history of smoking and currently smoke or have quit within the past 15 years. Yearly screening is stopped once you have quit smoking for at least 15 years or develop a health problem that would prevent you from having lung cancer treatment.  Fecal occult blood test (FOBT) of stool. / Every year beginning at age 50 and continuing until age 75. You may not have to do this test if you get a colonoscopy every 10 years.  Flexible sigmoidoscopy** or colonoscopy.** / Every 5 years for a flexible sigmoidoscopy or every 10 years for a colonoscopy beginning at age 50 and continuing until age 75.  Hepatitis C blood test.** / For all people born from 1945 through 1965 and any individual with known risks for hepatitis C.  Abdominal aortic aneurysm (AAA) screening.** / A one-time screening for ages 65 to 75 years who are current or former smokers.  Skin self-exam. / Monthly.  Influenza vaccine. / Every year.  Tetanus, diphtheria, and acellular pertussis (Tdap/Td) vaccine.** / 1 dose of Td every 10 years.  Varicella vaccine.** / Consult your health care provider.  Zoster vaccine.** / 1 dose for adults aged 60 years or older.  Pneumococcal 13-valent conjugate (PCV13) vaccine.** / Consult your health care provider.  Pneumococcal polysaccharide (PPSV23) vaccine.** / 1 dose for all adults aged 65 years and older.  Meningococcal vaccine.** / Consult your health care provider.  Hepatitis A vaccine.** / Consult your health care provider.  Hepatitis B vaccine.** / Consult your health care provider.  Haemophilus influenzae type b (Hib) vaccine.** / Consult your health care provider.  

## 2013-11-15 DIAGNOSIS — H2510 Age-related nuclear cataract, unspecified eye: Secondary | ICD-10-CM | POA: Diagnosis not present

## 2013-11-15 LAB — LIPID PANEL
Cholesterol: 195 mg/dL (ref 0–200)
HDL: 70 mg/dL (ref >39)
LDL Cholesterol: 87 mg/dL (ref 0–99)
Total CHOL/HDL Ratio: 2.8 Ratio
Triglycerides: 192 mg/dL — ABNORMAL HIGH (ref <150)
VLDL: 38 mg/dL (ref 0–40)

## 2013-11-15 LAB — BASIC METABOLIC PANEL WITH GFR
BUN: 27 mg/dL — AB (ref 6–23)
CO2: 28 meq/L (ref 19–32)
CREATININE: 1.21 mg/dL (ref 0.50–1.35)
Calcium: 9 mg/dL (ref 8.4–10.5)
Chloride: 101 mEq/L (ref 96–112)
GFR, EST AFRICAN AMERICAN: 64 mL/min
GFR, Est Non African American: 55 mL/min — ABNORMAL LOW
GLUCOSE: 89 mg/dL (ref 70–99)
Potassium: 5 mEq/L (ref 3.5–5.3)
Sodium: 135 mEq/L (ref 135–145)

## 2013-11-15 LAB — MICROALBUMIN / CREATININE URINE RATIO
CREATININE, URINE: 141.5 mg/dL
Microalb Creat Ratio: 14.8 mg/g (ref 0.0–30.0)
Microalb, Ur: 2.1 mg/dL — ABNORMAL HIGH (ref <2.0)

## 2013-11-15 LAB — URINALYSIS, MICROSCOPIC ONLY
Bacteria, UA: NONE SEEN
Casts: NONE SEEN
Crystals: NONE SEEN
Squamous Epithelial / LPF: NONE SEEN

## 2013-11-15 LAB — HEMOGLOBIN A1C
Hgb A1c MFr Bld: 5.2 % (ref <5.7)
Mean Plasma Glucose: 103 mg/dL (ref <117)

## 2013-11-15 LAB — HEPATIC FUNCTION PANEL
ALT: 22 U/L (ref 0–53)
AST: 20 U/L (ref 0–37)
Albumin: 4.1 g/dL (ref 3.5–5.2)
Alkaline Phosphatase: 67 U/L (ref 39–117)
Bilirubin, Direct: 0.1 mg/dL (ref 0.0–0.3)
Indirect Bilirubin: 0.5 mg/dL (ref 0.2–1.2)
TOTAL PROTEIN: 6.3 g/dL (ref 6.0–8.3)
Total Bilirubin: 0.6 mg/dL (ref 0.2–1.2)

## 2013-11-15 LAB — INSULIN, FASTING: INSULIN FASTING, SERUM: 8.7 u[IU]/mL (ref 2.0–19.6)

## 2013-11-15 LAB — TSH: TSH: 3.261 u[IU]/mL (ref 0.350–4.500)

## 2013-11-15 LAB — VITAMIN D 25 HYDROXY (VIT D DEFICIENCY, FRACTURES): VIT D 25 HYDROXY: 66 ng/mL (ref 30–89)

## 2013-11-15 LAB — MAGNESIUM: MAGNESIUM: 2 mg/dL (ref 1.5–2.5)

## 2013-11-20 DIAGNOSIS — H25011 Cortical age-related cataract, right eye: Secondary | ICD-10-CM | POA: Diagnosis not present

## 2013-11-20 DIAGNOSIS — H25811 Combined forms of age-related cataract, right eye: Secondary | ICD-10-CM | POA: Diagnosis not present

## 2013-11-20 DIAGNOSIS — H2511 Age-related nuclear cataract, right eye: Secondary | ICD-10-CM | POA: Diagnosis not present

## 2014-01-03 ENCOUNTER — Other Ambulatory Visit (INDEPENDENT_AMBULATORY_CARE_PROVIDER_SITE_OTHER): Payer: Medicare Other | Admitting: *Deleted

## 2014-01-03 DIAGNOSIS — Z1212 Encounter for screening for malignant neoplasm of rectum: Secondary | ICD-10-CM

## 2014-01-03 LAB — POC HEMOCCULT BLD/STL (HOME/3-CARD/SCREEN)
Card #3 Fecal Occult Blood, POC: NEGATIVE
FECAL OCCULT BLD: NEGATIVE
Fecal Occult Blood, POC: NEGATIVE

## 2014-02-05 DIAGNOSIS — D225 Melanocytic nevi of trunk: Secondary | ICD-10-CM | POA: Diagnosis not present

## 2014-02-05 DIAGNOSIS — L821 Other seborrheic keratosis: Secondary | ICD-10-CM | POA: Diagnosis not present

## 2014-02-05 DIAGNOSIS — L570 Actinic keratosis: Secondary | ICD-10-CM | POA: Diagnosis not present

## 2014-02-26 ENCOUNTER — Encounter: Payer: Self-pay | Admitting: Physician Assistant

## 2014-02-26 ENCOUNTER — Ambulatory Visit (INDEPENDENT_AMBULATORY_CARE_PROVIDER_SITE_OTHER): Payer: Medicare Other | Admitting: Physician Assistant

## 2014-02-26 VITALS — BP 110/68 | HR 60 | Temp 98.2°F | Resp 16 | Ht 74.5 in | Wt 211.0 lb

## 2014-02-26 DIAGNOSIS — R7309 Other abnormal glucose: Secondary | ICD-10-CM

## 2014-02-26 DIAGNOSIS — Z79899 Other long term (current) drug therapy: Secondary | ICD-10-CM

## 2014-02-26 DIAGNOSIS — E559 Vitamin D deficiency, unspecified: Secondary | ICD-10-CM | POA: Diagnosis not present

## 2014-02-26 DIAGNOSIS — I10 Essential (primary) hypertension: Secondary | ICD-10-CM

## 2014-02-26 DIAGNOSIS — E785 Hyperlipidemia, unspecified: Secondary | ICD-10-CM

## 2014-02-26 DIAGNOSIS — R7303 Prediabetes: Secondary | ICD-10-CM

## 2014-02-26 DIAGNOSIS — N400 Enlarged prostate without lower urinary tract symptoms: Secondary | ICD-10-CM

## 2014-02-26 LAB — CBC WITH DIFFERENTIAL/PLATELET
Basophils Absolute: 0 10*3/uL (ref 0.0–0.1)
Basophils Relative: 1 % (ref 0–1)
EOS PCT: 4 % (ref 0–5)
Eosinophils Absolute: 0.2 10*3/uL (ref 0.0–0.7)
HCT: 41.7 % (ref 39.0–52.0)
HEMOGLOBIN: 13.8 g/dL (ref 13.0–17.0)
Lymphocytes Relative: 26 % (ref 12–46)
Lymphs Abs: 1.2 10*3/uL (ref 0.7–4.0)
MCH: 30.3 pg (ref 26.0–34.0)
MCHC: 33.1 g/dL (ref 30.0–36.0)
MCV: 91.6 fL (ref 78.0–100.0)
MONOS PCT: 12 % (ref 3–12)
MPV: 9.2 fL (ref 8.6–12.4)
Monocytes Absolute: 0.5 10*3/uL (ref 0.1–1.0)
Neutro Abs: 2.6 10*3/uL (ref 1.7–7.7)
Neutrophils Relative %: 57 % (ref 43–77)
PLATELETS: 206 10*3/uL (ref 150–400)
RBC: 4.55 MIL/uL (ref 4.22–5.81)
RDW: 13.9 % (ref 11.5–15.5)
WBC: 4.5 10*3/uL (ref 4.0–10.5)

## 2014-02-26 LAB — LIPID PANEL
CHOLESTEROL: 242 mg/dL — AB (ref 0–200)
HDL: 79 mg/dL (ref >39)
LDL Cholesterol: 141 mg/dL — ABNORMAL HIGH (ref 0–99)
Total CHOL/HDL Ratio: 3.1 Ratio
Triglycerides: 110 mg/dL (ref <150)
VLDL: 22 mg/dL (ref 0–40)

## 2014-02-26 LAB — MAGNESIUM: MAGNESIUM: 2.1 mg/dL (ref 1.5–2.5)

## 2014-02-26 LAB — HEPATIC FUNCTION PANEL
ALK PHOS: 65 U/L (ref 39–117)
ALT: 23 U/L (ref 0–53)
AST: 22 U/L (ref 0–37)
Albumin: 4.2 g/dL (ref 3.5–5.2)
BILIRUBIN DIRECT: 0.1 mg/dL (ref 0.0–0.3)
BILIRUBIN TOTAL: 0.7 mg/dL (ref 0.2–1.2)
Indirect Bilirubin: 0.6 mg/dL (ref 0.2–1.2)
TOTAL PROTEIN: 6.7 g/dL (ref 6.0–8.3)

## 2014-02-26 LAB — BASIC METABOLIC PANEL WITH GFR
BUN: 25 mg/dL — AB (ref 6–23)
CO2: 24 mEq/L (ref 19–32)
Calcium: 9.3 mg/dL (ref 8.4–10.5)
Chloride: 103 mEq/L (ref 96–112)
Creat: 1.34 mg/dL (ref 0.50–1.35)
GFR, EST AFRICAN AMERICAN: 56 mL/min — AB
GFR, EST NON AFRICAN AMERICAN: 49 mL/min — AB
Glucose, Bld: 90 mg/dL (ref 70–99)
Potassium: 4.8 mEq/L (ref 3.5–5.3)
Sodium: 139 mEq/L (ref 135–145)

## 2014-02-26 LAB — TSH: TSH: 2.725 u[IU]/mL (ref 0.350–4.500)

## 2014-02-26 MED ORDER — TADALAFIL 10 MG PO TABS: ORAL_TABLET | ORAL | Status: DC

## 2014-02-26 MED ORDER — TADALAFIL 5 MG PO TABS: 5.0000 mg | ORAL_TABLET | Freq: Every day | ORAL | Status: DC | PRN

## 2014-02-26 NOTE — Progress Notes (Signed)
Assessment and Plan:  Hypertension: Continue medication, monitor blood pressure at home. Continue DASH diet.  Reminder to go to the ER if any CP, SOB, nausea, dizziness, severe HA, changes vision/speech, left arm numbness and tingling, and jaw pain. Cholesterol: Continue diet and exercise. Check cholesterol.  Pre-diabetes-Continue diet and exercise. Check A1C Vitamin D Def- check level and continue medications.   Continue diet and meds as discussed. Further disposition pending results of labs.  HPI 81 y.o. male  presents for 3 month follow up with hypertension, hyperlipidemia, prediabetes and vitamin D. His blood pressure has been controlled at home, today their BP is BP: 110/68 mmHg He does workout, 30 mins a day. He denies chest pain, shortness of breath. Occ gets dizzy if it is hot outside/dehyrated, has not had lately.  He is not on cholesterol medication and denies myalgias. His cholesterol is at goal. The cholesterol last visit was:   Lab Results  Component Value Date   CHOL 195 11/14/2013   HDL 70 11/14/2013   LDLCALC 87 11/14/2013   TRIG 192* 11/14/2013   CHOLHDL 2.8 11/14/2013  Last A1C in the office was:  Lab Results  Component Value Date   HGBA1C 5.2 11/14/2013  Patient is on Vitamin D supplement.   Lab Results  Component Value Date   VD25OH 66 11/14/2013  Has bilateral hip pain, sees Dr. Durward Fortes, does not want a replacement due to age, takes advil occ for this that helps.    Current Medications:  Current Outpatient Prescriptions on File Prior to Visit  Medication Sig Dispense Refill  . aspirin EC 81 MG tablet Take 81 mg by mouth daily.    . Cholecalciferol (VITAMIN D-3) 5000 UNITS TABS Take by mouth.    . doxazosin (CARDURA) 4 MG tablet Take 1 tablet (4 mg total) by mouth daily. 90 tablet 2  . finasteride (PROSCAR) 5 MG tablet Take 1 tablet (5 mg total) by mouth daily. 90 tablet 2  . hyoscyamine (LEVSIN, ANASPAZ) 0.125 MG tablet Take 0.125 mg by mouth every 6 (six)  hours as needed.    . Ibuprofen (ADVIL) 200 MG CAPS Take 200 mg by mouth 2 (two) times daily. Takes 2 tabs BID    . Krill Oil 1000 MG CAPS Take 1,000 mg by mouth daily.    . LUTEIN PO Take 5 mg by mouth 2 (two) times daily.    . tadalafil (CIALIS) 5 MG tablet Take 1 tablet (5 mg total) by mouth daily as needed for erectile dysfunction. 30 tablet 2   No current facility-administered medications on file prior to visit.   Medical History:  Past Medical History  Diagnosis Date  . Hypertension   . Hyperlipidemia   . Palpitations   . Sinus bradycardia   . DJD (degenerative joint disease)   . Vitamin D deficiency   . Allergic rhinitis   . Cancer     prostate   Allergies:  Allergies  Allergen Reactions  . Adhesive [Tape]   . Celebrex [Celecoxib] Other (See Comments)    GI upset  . Lipitor [Atorvastatin]   . Nickel   . Ppd [Tuberculin Purified Protein Derivative] Other (See Comments)    Positive PPD     Review of Systems:  Review of Systems  Constitutional: Negative.   HENT: Negative.   Respiratory: Negative.   Cardiovascular: Negative.   Gastrointestinal: Negative.   Genitourinary: Positive for frequency. Negative for dysuria, urgency, hematuria and flank pain.  Musculoskeletal: Positive for joint pain (bilateral hip  pain). Negative for myalgias, back pain, falls and neck pain.  Skin: Negative.   Psychiatric/Behavioral: Negative.     Family history- Review and unchanged Social history- Review and unchanged Physical Exam: BP 110/68 mmHg  Pulse 60  Temp(Src) 98.2 F (36.8 C)  Resp 16  Ht 6' 2.5" (1.892 m)  Wt 211 lb (95.709 kg)  BMI 26.74 kg/m2 Wt Readings from Last 3 Encounters:  02/26/14 211 lb (95.709 kg)  11/14/13 203 lb 6.4 oz (92.262 kg)  08/07/13 200 lb (90.719 kg)   General Appearance: Well nourished, in no apparent distress. Eyes: PERRLA, EOMs, conjunctiva no swelling or erythema Sinuses: No Frontal/maxillary tenderness ENT/Mouth: Ext aud canals clear,  TMs without erythema, bulging. No erythema, swelling, or exudate on post pharynx.  Tonsils not swollen or erythematous. Hearing normal.  Neck: Supple, thyroid normal.  Respiratory: Respiratory effort normal, BS equal bilaterally without rales, rhonchi, wheezing or stridor.  Cardio: RRR with no MRGs. Brisk peripheral pulses without edema.  Abdomen: Soft, + BS.  Non tender, no guarding, rebound, hernias, masses. Lymphatics: Non tender without lymphadenopathy.  Musculoskeletal: Full ROM, 5/5 strength, normal gait.  Skin: Warm, dry without rashes, lesions, ecchymosis.  Neuro: Cranial nerves intact. Normal muscle tone, no cerebellar symptoms. Sensation intact.  Psych: Awake and oriented X 3, normal affect, Insight and Judgment appropriate.    Vicie Mutters, PA-C 9:49 AM [facility]

## 2014-02-26 NOTE — Patient Instructions (Addendum)
Pharmacy [facility] [place] [phone] Can call this number to get the cialis cheaper You would get the 10mg , break in half and take daily   [employer] Test with no obligation # [phone] Tues-Sat 10-6  [facility]- free test with no obligation # [phone] Call for store hours  You can take tylenol (500mg ) or tylenol arthritis (650mg ) with the meloxicam/antiinflammatories. The max you can take of tylenol a day is 3000mg  daily, this is a max of 6 pills a day of the regular tyelnol (500mg ) or a max of 4 a day of the tylenol arthritis (650mg ) as long as no other medications you are taking contain tylenol.

## 2014-02-27 LAB — VITAMIN D 25 HYDROXY (VIT D DEFICIENCY, FRACTURES): Vit D, 25-Hydroxy: 48 ng/mL (ref 30–100)

## 2014-03-09 ENCOUNTER — Ambulatory Visit: Payer: Self-pay | Admitting: Physician Assistant

## 2014-04-12 DIAGNOSIS — H903 Sensorineural hearing loss, bilateral: Secondary | ICD-10-CM | POA: Diagnosis not present

## 2014-04-13 DIAGNOSIS — H903 Sensorineural hearing loss, bilateral: Secondary | ICD-10-CM | POA: Diagnosis not present

## 2014-04-26 ENCOUNTER — Other Ambulatory Visit: Payer: Self-pay | Admitting: Internal Medicine

## 2014-04-26 DIAGNOSIS — H60399 Other infective otitis externa, unspecified ear: Secondary | ICD-10-CM

## 2014-04-26 DIAGNOSIS — H40013 Open angle with borderline findings, low risk, bilateral: Secondary | ICD-10-CM | POA: Diagnosis not present

## 2014-04-26 MED ORDER — NEOMYCIN-POLYMYXIN-HC 3.5-10000-1 OT SUSP: OTIC | Status: DC

## 2014-06-15 ENCOUNTER — Ambulatory Visit: Payer: Self-pay | Admitting: Internal Medicine

## 2014-07-10 ENCOUNTER — Ambulatory Visit: Payer: Self-pay | Admitting: Internal Medicine

## 2014-07-10 ENCOUNTER — Other Ambulatory Visit: Payer: Self-pay | Admitting: Internal Medicine

## 2014-07-17 DIAGNOSIS — C61 Malignant neoplasm of prostate: Secondary | ICD-10-CM | POA: Diagnosis not present

## 2014-07-18 ENCOUNTER — Ambulatory Visit (INDEPENDENT_AMBULATORY_CARE_PROVIDER_SITE_OTHER): Payer: Medicare Other | Admitting: Internal Medicine

## 2014-07-18 ENCOUNTER — Encounter: Payer: Self-pay | Admitting: Internal Medicine

## 2014-07-18 VITALS — BP 100/62 | HR 60 | Temp 97.7°F | Resp 16 | Ht 74.5 in | Wt 202.6 lb

## 2014-07-18 DIAGNOSIS — R6889 Other general symptoms and signs: Secondary | ICD-10-CM

## 2014-07-18 DIAGNOSIS — Z0001 Encounter for general adult medical examination with abnormal findings: Secondary | ICD-10-CM

## 2014-07-18 DIAGNOSIS — R7303 Prediabetes: Secondary | ICD-10-CM

## 2014-07-18 DIAGNOSIS — Z1331 Encounter for screening for depression: Secondary | ICD-10-CM

## 2014-07-18 DIAGNOSIS — R7309 Other abnormal glucose: Secondary | ICD-10-CM

## 2014-07-18 DIAGNOSIS — Z0000 Encounter for general adult medical examination without abnormal findings: Secondary | ICD-10-CM

## 2014-07-18 DIAGNOSIS — E559 Vitamin D deficiency, unspecified: Secondary | ICD-10-CM

## 2014-07-18 DIAGNOSIS — I10 Essential (primary) hypertension: Secondary | ICD-10-CM

## 2014-07-18 DIAGNOSIS — Z79899 Other long term (current) drug therapy: Secondary | ICD-10-CM

## 2014-07-18 DIAGNOSIS — Z9181 History of falling: Secondary | ICD-10-CM

## 2014-07-18 DIAGNOSIS — E785 Hyperlipidemia, unspecified: Secondary | ICD-10-CM

## 2014-07-18 LAB — CBC WITH DIFFERENTIAL/PLATELET
BASOS ABS: 0 10*3/uL (ref 0.0–0.1)
Basophils Relative: 0 % (ref 0–1)
EOS ABS: 0.2 10*3/uL (ref 0.0–0.7)
Eosinophils Relative: 4 % (ref 0–5)
HCT: 40.6 % (ref 39.0–52.0)
Hemoglobin: 13.4 g/dL (ref 13.0–17.0)
Lymphocytes Relative: 23 % (ref 12–46)
Lymphs Abs: 1 10*3/uL (ref 0.7–4.0)
MCH: 29.8 pg (ref 26.0–34.0)
MCHC: 33 g/dL (ref 30.0–36.0)
MCV: 90.2 fL (ref 78.0–100.0)
MPV: 9.8 fL (ref 8.6–12.4)
Monocytes Absolute: 0.5 10*3/uL (ref 0.1–1.0)
Monocytes Relative: 11 % (ref 3–12)
NEUTROS ABS: 2.7 10*3/uL (ref 1.7–7.7)
NEUTROS PCT: 62 % (ref 43–77)
Platelets: 186 10*3/uL (ref 150–400)
RBC: 4.5 MIL/uL (ref 4.22–5.81)
RDW: 14.3 % (ref 11.5–15.5)
WBC: 4.3 10*3/uL (ref 4.0–10.5)

## 2014-07-18 LAB — LIPID PANEL
CHOL/HDL RATIO: 2.9 ratio
Cholesterol: 195 mg/dL (ref 0–200)
HDL: 67 mg/dL (ref >=40)
LDL Cholesterol: 104 mg/dL — ABNORMAL HIGH (ref 0–99)
TRIGLYCERIDES: 118 mg/dL (ref <150)
VLDL: 24 mg/dL (ref 0–40)

## 2014-07-18 LAB — HEPATIC FUNCTION PANEL
ALBUMIN: 4.2 g/dL (ref 3.5–5.2)
ALT: 21 U/L (ref 0–53)
AST: 20 U/L (ref 0–37)
Alkaline Phosphatase: 55 U/L (ref 39–117)
Bilirubin, Direct: 0.1 mg/dL (ref 0.0–0.3)
Indirect Bilirubin: 0.6 mg/dL (ref 0.2–1.2)
TOTAL PROTEIN: 6.5 g/dL (ref 6.0–8.3)
Total Bilirubin: 0.7 mg/dL (ref 0.2–1.2)

## 2014-07-18 LAB — BASIC METABOLIC PANEL WITH GFR
BUN: 35 mg/dL — AB (ref 6–23)
CO2: 25 meq/L (ref 19–32)
Calcium: 8.9 mg/dL (ref 8.4–10.5)
Chloride: 105 mEq/L (ref 96–112)
Creat: 1.36 mg/dL — ABNORMAL HIGH (ref 0.50–1.35)
GFR, EST NON AFRICAN AMERICAN: 48 mL/min — AB
GFR, Est African American: 55 mL/min — ABNORMAL LOW
Glucose, Bld: 92 mg/dL (ref 70–99)
Potassium: 4.8 mEq/L (ref 3.5–5.3)
Sodium: 140 mEq/L (ref 135–145)

## 2014-07-18 LAB — MAGNESIUM: Magnesium: 2.3 mg/dL (ref 1.5–2.5)

## 2014-07-18 NOTE — Patient Instructions (Addendum)

## 2014-07-18 NOTE — Progress Notes (Signed)
Patient ID: Eugene Friedman, male   DOB: 11/24/1928, 81 y.o.   MRN: [id number]  MEDICARE ANNUAL WELLNESS VISIT AND OV  Assessment:   1. Essential hypertension  - TSH  2. Hyperlipidemia  - Lipid panel  3. Prediabetes  - Hemoglobin A1c - Insulin, random  4. Vitamin D deficiency  - Vit D  25 hydroxy (rtn osteoporosis monitoring)  5. Screening for depression   6. At low risk for fall   7. Medication management  - CBC with Differential/Platelet - BASIC METABOLIC PANEL WITH GFR - Hepatic function panel - Magnesium  8. Routine general medical examination at a health care facility   Plan:   During the course of the visit the patient was educated and counseled about appropriate screening and preventive services including:    Pneumococcal vaccine   Influenza vaccine  Td vaccine  Screening electrocardiogram  Bone densitometry screening  Colorectal cancer screening  Diabetes screening  Glaucoma screening  Nutrition counseling   Advanced directives: requested  Screening recommendations, referrals: Vaccinations: Immunization History  Administered Date(s) Administered  . H1N1 02/17/2007  . Influenza Whole 12/05/2012  . Influenza, High Dose Seasonal PF 11/14/2013  . Influenza-Unspecified 12/05/2012  . Pneumococcal Polysaccharide-23 03/08/2008, 05/18/2013  . Tdap 02/17/2008  . Zoster 02/16/2005  Prevnar vaccine out of stock Hep B vaccine not indicated  Nutrition assessed and recommended  Colonoscopy 2003 Recommended yearly ophthalmology/optometry visit for glaucoma screening and checkup Recommended yearly dental visit for hygiene and checkup Advanced directives - yes  Conditions/risks identified: BMI: Discussed weight loss, diet, and increase physical activity.  Increase physical activity: AHA recommends 150 minutes of physical activity a week.  Medications reviewed PreDiabetes is at goal, ACE/ARB therapy: Not Indicated Urinary Incontinence is  not an issue: discussed non pharmacology and pharmacology options.  Fall risk: low- discussed PT, home fall assessment, medications.   Subjective:    Eugene Friedman presents for [employer] Visit and complete physical.  Date of last medicare wellness visit was 05/18/2013.  This very nice 81 y.o. [place] also  presents for 6 month follow up with Hypertension, Hyperlipidemia, Pre-Diabetes and Vitamin D Deficiency.    Patient is treated for HTN  Since 2001 & BP has been controlled at home. Today's BP: 100/62 mmHg.  He did have a neg Cardiolite in 2011 by Dr Cathie Olden. Patient has had no complaints of any cardiac type chest pain, palpitations, dyspnea/orthopnea/PND, dizziness, claudication, or dependent edema.   Hyperlipidemia is controlled with diet & meds. Patient denies myalgias or other med SE's. Last Lipids were not at goal - Total Chol 242; HDL 79; LDL  141; Trig 110 on 02/26/2014.   Also, the patient is screened for PreDiabetes and has had no symptoms of reactive hypoglycemia, diabetic polys, paresthesias or visual blurring.  Last A1c was 5.2% on 11/14/2013.    Further, the patient also has history of Vitamin D Deficiency and supplements vitamin D without any suspected side-effects. Last vitamin D was still sl low at  48 on 02/26/2014.  Names of Other Physician/Practitioners you currently use: 1. [facility] here for primary care 2. Dr Bing Plume, eye doctor, last visit Dec 2015 3. Dr Sarajane Jews, [place], dentist, last visit Mar 2016  Patient Care Team: [name], MD as PCP - General (Internal Medicine) Calvert Cantor, MD as Consulting Physician (Ophthalmology) Thayer Headings, MD as Consulting Physician (Cardiology) Carolan Clines, MD as Consulting Physician (Urology) Laurence Spates, MD as Consulting Physician (Gastroenterology) Armandina Gemma, MD as  Consulting Physician (General Surgery)  Medication Review: Medication Sig  . aspirin EC 81 MG  tablet Take 81 mg by mouth daily.  . Cholecalciferol (VITAMIN D-3) 5000 UNITS TABS Take by mouth.  . doxazosin (CARDURA) 4 MG tablet Take 1 tablet (4 mg total) by mouth daily.  . finasteride (PROSCAR) 5 MG tablet Take 1 tablet (5 mg total) by mouth daily.  . hyoscyamine (LEVSIN, ANASPAZ) 0.125 MG tablet Take 0.125 mg by mouth every 6 (six) hours as needed.  Javier Docker Oil 1000 MG CAPS Take 1,000 mg by mouth daily.  . LUTEIN PO Take 5 mg by mouth 2 (two) times daily.  Marland Kitchen neomycin-polymyxin-hydrocortisone (CORTISPORIN) 3.5-10000-1 otic suspension Instill 3 to 4 drops into affected ear canal up to 3-4 x daily  . tadalafil (CIALIS) 10 MG tablet 1/2-1 pill daily as directed.   Current Problems (verified) Patient Active Problem List   Diagnosis Date Noted  . Medication management 11/14/2013  . Vitamin D deficiency 02/12/2013  . Prediabetes 02/12/2013  . Elevated prostate specific antigen (PSA) 02/12/2013  . Essential hypertension 02/12/2013  . Hyperlipidemia   . Palpitations   . Sinus bradycardia    Screening Tests Health Maintenance  Topic Date Due  . COLONOSCOPY  02/17/2011  . PNA vac Low Risk Adult (2 of 2 - PCV13) 05/19/2014  . INFLUENZA VACCINE  09/17/2014  . TETANUS/TDAP  02/16/2018  . ZOSTAVAX  Completed    Immunization History  Administered Date(s) Administered  . H1N1 02/17/2007  . Influenza Whole 12/05/2012  . Influenza, High Dose Seasonal PF 11/14/2013  . Influenza-Unspecified 12/05/2012  . Pneumococcal Polysaccharide-23 03/08/2008, 05/18/2013  . Tdap 02/17/2008  . Zoster 02/16/2005    Preventative care: Last colonoscopy: 2003   History reviewed: allergies, current medications, past family history, past medical history, past social history, past surgical history and problem list  Risk Factors: Tobacco History  Substance Use Topics  . Smoking status: Never Smoker   . Smokeless tobacco: Not on file  . Alcohol Use: 6.0 oz/week    10 Glasses of wine per week   He  does not smoke.  Patient is not a former smoker. Are there smokers in your home (other than you)?  No  Alcohol Current alcohol use: glass of wine with dinner, 1-2  glasses of wine per day  Caffeine Current caffeine use: coffee 2 cups /day  Exercise Current exercise: walking, yard work and boating  Nutrition/Diet Current diet: in general, a "healthy" diet    Cardiac risk factors: advanced age (older than 81 for men, 57 for women), dyslipidemia, hypertension, male gender and sedentary lifestyle.  Depression Screen (Note: if answer to either of the following is "Yes", a more complete depression screening is indicated)   Q1: Over the past two weeks, have you felt down, depressed or hopeless? No  Q2: Over the past two weeks, have you felt little interest or pleasure in doing things? No  Have you lost interest or pleasure in daily life? No  Do you often feel hopeless? No  Do you cry easily over simple problems? No  Activities of Daily Living In your present state of health, do you have any difficulty performing the following activities?:  Driving? No Managing money?  No Feeding yourself? No Getting from bed to chair? No Climbing a flight of stairs? No Preparing food and eating?: No Bathing or showering? No Getting dressed: No Getting to the toilet? No Using the toilet:No Moving around from place to place: No In the past  year have you fallen or had a near fall?:No   Are you sexually active?  Yes  Do you have more than one partner?  No  Vision Difficulties: No  Hearing Difficulties: No Do you often ask people to speak up or repeat themselves? No Do you experience ringing or noises in your ears? No Do you have difficulty understanding soft or whispered voices? No  Cognition  Do you feel that you have a problem with memory?No  Do you often misplace items? No  Do you feel safe at home?  Yes  Advanced directives Does patient have a [place]? Yes Does  patient have a Living Will? Yes  Past Medical History  Diagnosis Date  . Hypertension   . Hyperlipidemia   . Palpitations   . Sinus bradycardia   . DJD (degenerative joint disease)   . Vitamin D deficiency   . Allergic rhinitis   . Cancer     prostate   Past Surgical History  Procedure Laterality Date  . Cervical neck mass   07/08/06    Left Excision left posterior cervical neck mass (2.5 cm). SURGEON:  Earnstine Regal, M.D., Allegra Grana 07/08/06 and 12/05/07  . Appendectomy    . Mastoidectomy Left   . Flexible sigmoidoscopy     ROS: Constitutional: Denies fever, chills, weight loss/gain, headaches, insomnia, fatigue, night sweats or change in appetite. Eyes: Denies redness, blurred vision, diplopia, discharge, itchy or watery eyes.  ENT: Denies discharge, congestion, post nasal drip, epistaxis, sore throat, earache, hearing loss, dental pain, Tinnitus, Vertigo, Sinus pain or snoring.  Cardio: Denies chest pain, palpitations, irregular heartbeat, syncope, dyspnea, diaphoresis, orthopnea, PND, claudication or edema Respiratory: denies cough, dyspnea, DOE, pleurisy, hoarseness, laryngitis or wheezing.  Gastrointestinal: Denies dysphagia, heartburn, reflux, water brash, pain, cramps, nausea, vomiting, bloating, diarrhea, constipation, hematemesis, melena, hematochezia, jaundice or hemorrhoids Genitourinary: Denies dysuria, frequency, urgency, nocturia, hesitancy, discharge, hematuria or flank pain Musculoskeletal: Denies arthralgia, myalgia, stiffness, Jt. Swelling, pain, limp or strain/sprain. Denies Falls. Skin: Denies puritis, rash, hives, warts, acne, eczema or change in skin lesion Neuro: No weakness, tremor, incoordination, spasms, paresthesia or pain Psychiatric: Denies confusion, memory loss or sensory loss. Denies Depression. Endocrine: Denies change in weight, skin, hair change, nocturia, and paresthesia, diabetic polys, visual blurring or hyper / hypo glycemic episodes.  Heme/Lymph:  No excessive bleeding, bruising or enlarged lymph nodes.  Objective:     BP 100/62   Pulse 60  Temp 97.7 F  Resp 16  Ht 6' 2.5"   Wt 202 lb 9.6 oz     BMI 25.67   General Appearance:  Alert  WD/WN, male  in no apparent distress. Eyes: PERRLA, EOMs nl, conjunctiva normal, normal fundi and vessels. Sinuses: No frontal/maxillary tenderness ENT/Mouth: EACs patent / TMs  nl. Nares clear without erythema, swelling, mucoid exudates. Oral hygiene is good. No erythema, swelling, or exudate. Tongue normal, non-obstructing. Tonsils not swollen or erythematous. Hearing normal.  Neck: Supple, thyroid normal. No bruits, nodes or JVD. Respiratory: Respiratory effort normal.  BS equal and clear bilateral without rales, rhonci, wheezing or stridor. Cardio: Heart sounds are normal with regular rate and rhythm and no murmurs, rubs or gallops. Peripheral pulses are normal and equal bilaterally without edema. No aortic or femoral bruits. Chest: symmetric with normal excursions and percussion.  Abdomen: Flat, soft, with nl bowel sounds. Nontender, no guarding, rebound, hernias, masses, or organomegaly.  Lymphatics: Non tender without lymphadenopathy.  Musculoskeletal: Full ROM all peripheral extremities, joint stability,  5/5 strength, and normal gait. Skin: Warm and dry without rashes, lesions, cyanosis, clubbing or  ecchymosis.  Neuro: Cranial nerves intact, reflexes equal bilaterally. Normal muscle tone, no cerebellar symptoms. Sensation intact.  Pysch: Alert and oriented X 3 with normal affect, insight and judgment appropriate.   Cognitive Testing  Alert? Yes  Normal Appearance? Yes  Oriented to person? Yes  Place? Yes   Time? Yes  Recall of three objects?  Yes  Can perform simple calculations? Yes  Displays appropriate judgment? Yes  Can read the correct time from a watch/clock? Yes  Medicare Attestation I have personally reviewed: The patient's medical and social history Their use of alcohol,  tobacco or illicit drugs Their current medications and supplements The patient's functional ability including ADLs,fall risks, home safety risks, cognitive, and hearing and visual impairment Diet and physical activities Evidence for depression or mood disorders  The patient's weight, height, BMI, and visual acuity have been recorded in the chart.  I have made referrals, counseling, and provided education to the patient based on review of the above and I have provided the patient with a written personalized care plan for preventive services.  Over 40 minutes of exam, counseling, chart review was performed.  Briselda Naval DAVID, MD   07/18/2014

## 2014-07-19 DIAGNOSIS — R3912 Poor urinary stream: Secondary | ICD-10-CM | POA: Diagnosis not present

## 2014-07-19 DIAGNOSIS — R350 Frequency of micturition: Secondary | ICD-10-CM | POA: Diagnosis not present

## 2014-07-19 DIAGNOSIS — R351 Nocturia: Secondary | ICD-10-CM | POA: Diagnosis not present

## 2014-07-19 DIAGNOSIS — C61 Malignant neoplasm of prostate: Secondary | ICD-10-CM | POA: Diagnosis not present

## 2014-07-19 LAB — HEMOGLOBIN A1C
Hgb A1c MFr Bld: 5.5 % (ref <5.7)
Mean Plasma Glucose: 111 mg/dL (ref <117)

## 2014-07-19 LAB — TSH: TSH: 1.978 u[IU]/mL (ref 0.350–4.500)

## 2014-07-19 LAB — VITAMIN D 25 HYDROXY (VIT D DEFICIENCY, FRACTURES): Vit D, 25-Hydroxy: 52 ng/mL (ref 30–100)

## 2014-07-19 LAB — INSULIN, RANDOM: Insulin: 5.6 u[IU]/mL (ref 2.0–19.6)

## 2014-07-25 ENCOUNTER — Telehealth: Payer: Self-pay | Admitting: *Deleted

## 2014-07-25 MED ORDER — AZITHROMYCIN 250 MG PO TABS: ORAL_TABLET | ORAL | Status: AC

## 2014-07-25 NOTE — Telephone Encounter (Signed)
Patient called and states he is going out of the country and requested a Z-pak to take with him.  He also states Dr Gaynelle Arabian has doubled his Doxazosin dose to 8 mg daily.  Per Dr Melford Aase, [place] for Z-pak

## 2014-08-13 ENCOUNTER — Other Ambulatory Visit: Payer: Self-pay

## 2014-11-15 DIAGNOSIS — H26491 Other secondary cataract, right eye: Secondary | ICD-10-CM | POA: Diagnosis not present

## 2014-11-20 ENCOUNTER — Encounter: Payer: Self-pay | Admitting: Internal Medicine

## 2014-11-20 ENCOUNTER — Ambulatory Visit (INDEPENDENT_AMBULATORY_CARE_PROVIDER_SITE_OTHER): Payer: Medicare Other | Admitting: Internal Medicine

## 2014-11-20 VITALS — BP 138/72 | HR 56 | Temp 97.8°F | Resp 16 | Ht 74.0 in | Wt 206.4 lb

## 2014-11-20 DIAGNOSIS — Z79899 Other long term (current) drug therapy: Secondary | ICD-10-CM

## 2014-11-20 DIAGNOSIS — E559 Vitamin D deficiency, unspecified: Secondary | ICD-10-CM | POA: Diagnosis not present

## 2014-11-20 DIAGNOSIS — Z9181 History of falling: Secondary | ICD-10-CM

## 2014-11-20 DIAGNOSIS — Z23 Encounter for immunization: Secondary | ICD-10-CM | POA: Diagnosis not present

## 2014-11-20 DIAGNOSIS — R7303 Prediabetes: Secondary | ICD-10-CM

## 2014-11-20 DIAGNOSIS — I10 Essential (primary) hypertension: Secondary | ICD-10-CM | POA: Diagnosis not present

## 2014-11-20 DIAGNOSIS — R7309 Other abnormal glucose: Secondary | ICD-10-CM

## 2014-11-20 DIAGNOSIS — R972 Elevated prostate specific antigen [PSA]: Secondary | ICD-10-CM | POA: Diagnosis not present

## 2014-11-20 DIAGNOSIS — Z6825 Body mass index (BMI) 25.0-25.9, adult: Secondary | ICD-10-CM

## 2014-11-20 DIAGNOSIS — Z789 Other specified health status: Secondary | ICD-10-CM

## 2014-11-20 DIAGNOSIS — Z125 Encounter for screening for malignant neoplasm of prostate: Secondary | ICD-10-CM

## 2014-11-20 DIAGNOSIS — Z1331 Encounter for screening for depression: Secondary | ICD-10-CM

## 2014-11-20 DIAGNOSIS — E785 Hyperlipidemia, unspecified: Secondary | ICD-10-CM | POA: Diagnosis not present

## 2014-11-20 DIAGNOSIS — Z1212 Encounter for screening for malignant neoplasm of rectum: Secondary | ICD-10-CM

## 2014-11-20 DIAGNOSIS — Z1389 Encounter for screening for other disorder: Secondary | ICD-10-CM

## 2014-11-20 LAB — BASIC METABOLIC PANEL WITH GFR
BUN: 32 mg/dL — AB (ref 7–25)
CALCIUM: 9.3 mg/dL (ref 8.6–10.3)
CO2: 25 mmol/L (ref 20–31)
CREATININE: 1.47 mg/dL — AB (ref 0.70–1.11)
Chloride: 104 mmol/L (ref 98–110)
GFR, EST AFRICAN AMERICAN: 50 mL/min — AB (ref >=60)
GFR, Est Non African American: 43 mL/min — ABNORMAL LOW (ref >=60)
GLUCOSE: 83 mg/dL (ref 65–99)
Potassium: 4.6 mmol/L (ref 3.5–5.3)
Sodium: 139 mmol/L (ref 135–146)

## 2014-11-20 LAB — HEPATIC FUNCTION PANEL
ALBUMIN: 4.3 g/dL (ref 3.6–5.1)
ALT: 22 U/L (ref 9–46)
AST: 24 U/L (ref 10–35)
Alkaline Phosphatase: 72 U/L (ref 40–115)
Bilirubin, Direct: 0.1 mg/dL (ref <=0.2)
Indirect Bilirubin: 0.6 mg/dL (ref 0.2–1.2)
TOTAL PROTEIN: 6.9 g/dL (ref 6.1–8.1)
Total Bilirubin: 0.7 mg/dL (ref 0.2–1.2)

## 2014-11-20 LAB — LIPID PANEL
CHOLESTEROL: 206 mg/dL — AB (ref 125–200)
HDL: 68 mg/dL (ref >=40)
LDL Cholesterol: 112 mg/dL (ref <130)
TRIGLYCERIDES: 128 mg/dL (ref <150)
Total CHOL/HDL Ratio: 3 Ratio (ref <=5.0)
VLDL: 26 mg/dL (ref <30)

## 2014-11-20 LAB — CBC WITH DIFFERENTIAL/PLATELET
Basophils Absolute: 0.1 10*3/uL (ref 0.0–0.1)
Basophils Relative: 1 % (ref 0–1)
EOS PCT: 2 % (ref 0–5)
Eosinophils Absolute: 0.1 10*3/uL (ref 0.0–0.7)
HEMATOCRIT: 39.6 % (ref 39.0–52.0)
Hemoglobin: 13.3 g/dL (ref 13.0–17.0)
LYMPHS ABS: 1 10*3/uL (ref 0.7–4.0)
LYMPHS PCT: 20 % (ref 12–46)
MCH: 30.2 pg (ref 26.0–34.0)
MCHC: 33.6 g/dL (ref 30.0–36.0)
MCV: 89.8 fL (ref 78.0–100.0)
MONO ABS: 0.6 10*3/uL (ref 0.1–1.0)
MPV: 9.6 fL (ref 8.6–12.4)
Monocytes Relative: 11 % (ref 3–12)
Neutro Abs: 3.3 10*3/uL (ref 1.7–7.7)
Neutrophils Relative %: 66 % (ref 43–77)
Platelets: 198 10*3/uL (ref 150–400)
RBC: 4.41 MIL/uL (ref 4.22–5.81)
RDW: 13.5 % (ref 11.5–15.5)
WBC: 5 10*3/uL (ref 4.0–10.5)

## 2014-11-20 LAB — TSH: TSH: 2.593 u[IU]/mL (ref 0.350–4.500)

## 2014-11-20 LAB — MAGNESIUM: Magnesium: 2.4 mg/dL (ref 1.5–2.5)

## 2014-11-20 NOTE — Progress Notes (Signed)
Patient ID: Eugene Friedman, male   DOB: 07-Sep-1928, 81 y.o.   MRN: [id number]   Comprehensive Examination  This very nice 81 y.o. Eugene Friedman presents for complete physical.  Patient has been followed for HTN, Prediabetes, Hyperlipidemia, and Vitamin D Deficiency. Patient also has Gleason 6 Prostate Cancer since 2007  being followed very closely by Dr Gaynelle Arabian.    HTN predates since 2001. Patient's BP has been controlled at home.Today's BP: 138/72 mmHg. Patient denies any cardiac symptoms as chest pain, palpitations, shortness of breath, dizziness or ankle swelling.   Patient's hyperlipidemia is controlled with diet and medications. Patient denies myalgias or other medication SE's. Patient had a negative Cardiolite scan in 2011.  Last lipids were near goal  Cholesterol 195; HDL 67; LDL 104*; Triglycerides 118 on 07/18/2014.    Patient has prediabetes since Dec 2014 with an elevated A1c of 6.1%.   and patient denies reactive hypoglycemic symptoms, visual blurring, diabetic polys or paresthesias. Last A1c was 5.5% on 07/18/2014.   Finally, patient has history of Vitamin D Deficiency of 42 in 2009 and last vitamin D was 52 on 07/18/2014.       Medication Sig  . aspirin EC 81 MG tablet Take 81 mg by mouth daily.  Marland Kitchen VITAMIN D 5000 UNITS TABS Take by mouth.  . doxazosin (CARDURA) 4 MG tablet Take 1 tablet (4 mg total) by mouth daily. (Patient taking differently: Take 4 mg by mouth 2 (two) times daily. )  . finasteride (PROSCAR) 5 MG tablet Take 1 tablet (5 mg total) by mouth daily.  . hyoscyamine (LEVSIN, ANASPAZ) 0.125 MG tablet Take 0.125 mg by mouth every 6 (six) hours as needed.  Javier Docker Oil 1000 MG CAPS Take 1,000 mg by mouth daily.  . LUTEIN PO Take 5 mg by mouth 2 (two) times daily.  . tadalafil (CIALIS) 10 MG tablet 1/2-1 pill daily as directed.   Allergies  Allergen Reactions  . Adhesive [Tape]   . Celebrex [Celecoxib] Other (See Comments)    GI upset  . Lipitor [Atorvastatin]   . Nickel   .  Ppd [Tuberculin Purified Protein Derivative] Other (See Comments)    Positive PPD   Past Medical History  Diagnosis Date  . Hypertension   . Hyperlipidemia   . Palpitations   . Sinus bradycardia   . DJD (degenerative joint disease)   . Vitamin D deficiency   . Allergic rhinitis   . Cancer     prostate   Health Maintenance  Topic Date Due  . PNA vac Low Risk Adult (2 of 2 - PCV13) 05/19/2014  . INFLUENZA VACCINE  09/17/2014  . TETANUS/TDAP  02/16/2018  . ZOSTAVAX  Completed   Immunization History  Administered Date(s) Administered  . H1N1 02/17/2007  . Influenza Whole 12/05/2012  . Influenza, High Dose Seasonal PF 11/14/2013  . Influenza-Unspecified 12/05/2012  . Pneumococcal Polysaccharide-23 03/08/2008, 05/18/2013  . Tdap 02/17/2008  . Zoster 02/16/2005   Past Surgical History  Procedure Laterality Date  . Cervical neck mass   07/08/06    Left Excision left posterior cervical neck mass (2.5 cm). SURGEON:  Earnstine Regal, M.D., Allegra Grana 07/08/06 and 12/05/07  . Appendectomy    . Mastoidectomy Left   . Flexible sigmoidoscopy     Family History  Problem Relation Age of Onset  . Stroke Mother   . Diabetes Mother   . Heart attack Father   . Hypertension Father   . Diabetes Maternal Aunt   . Diabetes  Paternal Aunt   . Cancer Maternal Grandmother    Social History   Social History  . Marital Status: Widowed    Spouse Name: N/A  . Number of Children: N/A  . Years of Education: N/A   Occupational History  . 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 Smoking status: Never Smoker   . Smokeless tobacco: Not on file  . Alcohol Use: 6.0 oz/week    10 Glasses of wine per week  . Drug Use: No  . Sexual Activity: Active    ROS Constitutional: Denies fever, chills, weight loss/gain, headaches, insomnia,  night sweats or change in appetite. Does c/o fatigue. Eyes: Denies redness, blurred vision, diplopia, discharge, itchy  or watery eyes.  ENT: Denies discharge, congestion, post nasal drip, epistaxis, sore throat, earache, hearing loss, dental pain, Tinnitus, Vertigo, Sinus pain or snoring.  Cardio: Denies chest pain, palpitations, irregular heartbeat, syncope, dyspnea, diaphoresis, orthopnea, PND, claudication or edema Respiratory: denies cough, dyspnea, DOE, pleurisy, hoarseness, laryngitis or wheezing.  Gastrointestinal: Denies dysphagia, heartburn, reflux, water brash, pain, cramps, nausea, vomiting, bloating, diarrhea, constipation, hematemesis, melena, hematochezia, jaundice or hemorrhoids Genitourinary: Denies dysuria, frequency, urgency, nocturia, hesitancy, discharge, hematuria or flank pain Musculoskeletal: Denies arthralgia, myalgia, stiffness, Jt. Swelling, pain, limp or strain/sprain. Denies Falls. Skin: Denies puritis, rash, hives, warts, acne, eczema or change in skin lesion Neuro: No weakness, tremor, incoordination, spasms, paresthesia or pain Psychiatric: Denies confusion, memory loss or sensory loss. Denies Depression. Endocrine: Denies change in weight, skin, hair change, nocturia, and paresthesia, diabetic polys, visual blurring or hyper / hypo glycemic episodes.  Heme/Lymph: No excessive bleeding, bruising or enlarged lymph nodes.  Physical Exam  BP 138/72 mmHg  Pulse 56  Temp(Src) 97.8 F (36.6 C)  Resp 16  Ht 6\' 2"  (1.88 m)  Wt 206 lb 6.4 oz (93.622 kg)  BMI 26.49 kg/m2  General Appearance: Well nourished &  in no apparent distress. Eyes: PERRLA, EOMs, conjunctiva no swelling or erythema, normal fundi and vessels. Sinuses: No frontal/maxillary tenderness ENT/Mouth: EACs patent / TMs  nl. Nares clear without erythema, swelling, mucoid exudates. Oral hygiene is good. No erythema, swelling, or exudate. Tongue normal, non-obstructing. Tonsils not swollen or erythematous. Hearing normal.  Neck: Supple, thyroid normal. No bruits, nodes or JVD. Respiratory: Respiratory effort normal.  BS  equal and clear bilateral without rales, rhonci, wheezing or stridor. Cardio: Heart sounds are normal with regular rate and rhythm and no murmurs, rubs or gallops. Peripheral pulses are normal and equal bilaterally without edema. No aortic or femoral bruits. Chest: symmetric with normal excursions and percussion.  Abdomen: Flat, soft, with bowel sounds. Nontender, no guarding, rebound, hernias, masses, or organomegaly.  Lymphatics: Non tender without lymphadenopathy.  Genitourinary: No hernias.Testes nl. DRE - prostate nl for age - smooth & firm w/o nodules. Musculoskeletal: Full ROM all peripheral extremities, joint stability, 5/5 strength, and normal gait. Skin: Warm and dry without rashes, lesions, cyanosis, clubbing or  ecchymosis.  Neuro: Cranial nerves intact, reflexes equal bilaterally. Normal muscle tone, no cerebellar symptoms. Sensation intact.  Pysch: Alert and oriented X 3 with normal affect, insight and judgment appropriate.   Assessment and Plan  1. Essential hypertension  - Microalbumin / creatinine urine ratio - EKG 12-Lead - [place], RETROPERITNL ABD,  LTD - TSH  2. Hyperlipidemia  - Lipid panel  3. Prediabetes  - Hemoglobin A1c - Insulin, random  4. Vitamin D deficiency  -  Vit D  25 hydroxy   5. Elevated prostate specific antigen (PSA)  - PSA check deferred to Dr Gaynelle Arabian  6. Screening for rectal cancer  - POC Hemoccult Bld/Stl   7. Prostate cancer screening   8. At low risk for fall   9. Depression screen   10. Medication management   11. BMI 25.67,  adult  - Urinalysis, Routine w reflex microscopic - CBC with Differential/Platelet - BASIC METABOLIC PANEL WITH GFR - Hepatic function panel - Magnesium  12. Other abnormal glucose  - Hemoglobin A1c  13. Need for prophylactic vaccination and inoculation against influenza  - Flu vaccine HIGH DOSE PF (Fluzone High dose)   Continue prudent diet as discussed, weight control, BP monitoring,  regular exercise, and medications as discussed.  Discussed med effects and SE's. Routine screening labs and tests as requested with regular follow-up as recommended.  Over 40 minutes of exam, counseling &  chart review was performed

## 2014-11-20 NOTE — Patient Instructions (Signed)

## 2014-11-21 LAB — URINALYSIS, ROUTINE W REFLEX MICROSCOPIC
BILIRUBIN URINE: NEGATIVE
Glucose, UA: NEGATIVE
Hgb urine dipstick: NEGATIVE
KETONES UR: NEGATIVE
Leukocytes, UA: NEGATIVE
Nitrite: NEGATIVE
Protein, ur: NEGATIVE
SPECIFIC GRAVITY, URINE: 1.006 (ref 1.001–1.035)
pH: 6 (ref 5.0–8.0)

## 2014-11-21 LAB — MICROALBUMIN / CREATININE URINE RATIO
Creatinine, Urine: 35.6 mg/dL
Microalb, Ur: <0.2 mg/dL (ref <2.0)

## 2014-11-21 LAB — VITAMIN D 25 HYDROXY (VIT D DEFICIENCY, FRACTURES): Vit D, 25-Hydroxy: 63 ng/mL (ref 30–100)

## 2014-11-21 LAB — HEMOGLOBIN A1C
Hgb A1c MFr Bld: 5.6 % (ref <5.7)
MEAN PLASMA GLUCOSE: 114 mg/dL (ref <117)

## 2014-11-21 LAB — INSULIN, RANDOM: INSULIN: 3.3 u[IU]/mL (ref 2.0–19.6)

## 2014-11-28 ENCOUNTER — Encounter: Payer: Self-pay | Admitting: *Deleted

## 2014-12-05 ENCOUNTER — Ambulatory Visit (INDEPENDENT_AMBULATORY_CARE_PROVIDER_SITE_OTHER): Payer: Medicare Other | Admitting: Internal Medicine

## 2014-12-05 VITALS — BP 134/80 | HR 68 | Temp 99.3°F | Resp 18 | Ht 74.0 in | Wt 211.4 lb

## 2014-12-05 DIAGNOSIS — R1032 Left lower quadrant pain: Secondary | ICD-10-CM

## 2014-12-05 DIAGNOSIS — Z23 Encounter for immunization: Secondary | ICD-10-CM

## 2014-12-05 MED ORDER — AMOXICILLIN-POT CLAVULANATE 875-125 MG PO TABS: 1.0000 | ORAL_TABLET | Freq: Two times a day (BID) | ORAL | Status: DC

## 2014-12-05 MED ORDER — HYOSCYAMINE SULFATE 0.125 MG SL SUBL: SUBLINGUAL_TABLET | SUBLINGUAL | Status: DC

## 2014-12-05 NOTE — Progress Notes (Signed)
   Subjective:    Patient ID: Eugene Friedman, male    DOB: Mar 17, 1928, 81 y.o.   MRN: [id number]  GI Problem The primary symptoms include abdominal pain. Primary symptoms do not include fever, fatigue, nausea, vomiting, diarrhea, melena, hematochezia or dysuria.  The illness is also significant for chills and bloating. The illness does not include anorexia, dysphagia, odynophagia or constipation. Significant associated medical issues include diverticulitis. Associated medical issues do not include inflammatory bowel disease or GERD.  Patient presents to the office for evaluation for abdominal pain which has bothered him intermittently for years.  He reports that for the past 1 week.  He reports that he has intermittently been experiencing sharp llq abdominal pain and epigastric pain.  He reports that it feels like sharp pains which are similar to gas pains.  He reports that he though at first that he ate bad scallops and then was taking pepto bismol and also hyoscamine which he thought helped but he tried stopping it and the pain came right back. He denies water brash, burning esophagus,     Review of Systems  Constitutional: Positive for chills. Negative for fever and fatigue.  Gastrointestinal: Positive for abdominal pain and bloating. Negative for dysphagia, nausea, vomiting, diarrhea, constipation, melena, hematochezia and anorexia.  Genitourinary: Negative for dysuria, urgency, frequency and hematuria.  All other systems reviewed and are negative.      Objective:   Physical Exam  Constitutional: He is oriented to person, place, and time. He appears well-developed and well-nourished. No distress.  HENT:  Head: Normocephalic.  Mouth/Throat: Oropharynx is clear and moist. No oropharyngeal exudate.  Eyes: Conjunctivae are normal. No scleral icterus.  Neck: Normal range of motion. Neck supple. No JVD present. No thyromegaly present.  Cardiovascular: Normal rate, regular rhythm, normal  heart sounds and intact distal pulses.  Exam reveals no gallop and no friction rub.   No murmur heard. Pulmonary/Chest: Effort normal and breath sounds normal.  Abdominal: Soft. Bowel sounds are normal. He exhibits no distension and no mass. There is tenderness. There is no rebound and no guarding.  Musculoskeletal: Normal range of motion.  Lymphadenopathy:    He has no cervical adenopathy.  Neurological: He is alert and oriented to person, place, and time.  Skin: Skin is warm and dry. He is not diaphoretic.  Psychiatric: He has a normal mood and affect. His behavior is normal. Judgment and thought content normal.  Nursing note and vitals reviewed.   Filed Vitals:   12/05/14 1525  BP: 134/80  Pulse: 68  Temp: 99.3 F (37.4 C)  Resp: 18          Assessment & Plan:    1. LLQ pain -possible diverticulitis vs. ibs -probiotics - amoxicillin-clavulanate (AUGMENTIN) 875-125 MG tablet; Take 1 tablet by mouth 2 (two) times daily. One po bid x 7 days  Dispense: 14 tablet; Refill: 0 - hyoscyamine (LEVSIN SL) 0.125 MG SL tablet; Take 1 to 2 tablets 3 to 4 x day if needed for Nausea, vomiting, cramping or diarrhea  Dispense: 90 tablet; Refill: 0  2. Need for prophylactic vaccination against Streptococcus pneumoniae (pneumococcus)  - Pneumococcal conjugate vaccine 13-valent

## 2014-12-05 NOTE — Patient Instructions (Signed)
Diverticulitis °Diverticulitis is inflammation or infection of small pouches in your colon that form when you have a condition called diverticulosis. The pouches in your colon are called diverticula. Your colon, or large intestine, is where water is absorbed and stool is formed. °Complications of diverticulitis can include: °· Bleeding. °· Severe infection. °· Severe pain. °· Perforation of your colon. °· Obstruction of your colon. °CAUSES  °Diverticulitis is caused by bacteria. °Diverticulitis happens when stool becomes trapped in diverticula. This allows bacteria to grow in the diverticula, which can lead to inflammation and infection. °RISK FACTORS °People with diverticulosis are at risk for diverticulitis. Eating a diet that does not include enough fiber from fruits and vegetables may make diverticulitis more likely to develop. °SYMPTOMS  °Symptoms of diverticulitis may include: °· Abdominal pain and tenderness. The pain is normally located on the left side of the abdomen, but may occur in other areas. °· Fever and chills. °· Bloating. °· Cramping. °· Nausea. °· Vomiting. °· Constipation. °· Diarrhea. °· Blood in your stool. °DIAGNOSIS  °Your health care provider will ask you about your medical history and do a physical exam. You may need to have tests done because many medical conditions can cause the same symptoms as diverticulitis. Tests may include: °· Blood tests. °· Urine tests. °· Imaging tests of the abdomen, including X-rays and CT scans. °When your condition is under control, your health care provider may recommend that you have a colonoscopy. A colonoscopy can show how severe your diverticula are and whether something else is causing your symptoms. °TREATMENT  °Most cases of diverticulitis are mild and can be treated at home. Treatment may include: °· Taking over-the-counter pain medicines. °· Following a clear liquid diet. °· Taking antibiotic medicines by mouth for 7-10 days. °More severe cases may  be treated at a hospital. Treatment may include: °· Not eating or drinking. °· Taking prescription pain medicine. °· Receiving antibiotic medicines through an IV tube. °· Receiving fluids and nutrition through an IV tube. °· Surgery. °HOME CARE INSTRUCTIONS  °· Follow your health care provider's instructions carefully. °· Follow a full liquid diet or other diet as directed by your health care provider. After your symptoms improve, your health care provider may tell you to change your diet. He or she may recommend you eat a high-fiber diet. Fruits and vegetables are good sources of fiber. Fiber makes it easier to pass stool. °· Take fiber supplements or probiotics as directed by your health care provider. °· Only take medicines as directed by your health care provider. °· Keep all your follow-up appointments. °SEEK MEDICAL CARE IF:  °· Your pain does not improve. °· You have a hard time eating food. °· Your bowel movements do not return to normal. °SEEK IMMEDIATE MEDICAL CARE IF:  °· Your pain becomes worse. °· Your symptoms do not get better. °· Your symptoms suddenly get worse. °· You have a fever. °· You have repeated vomiting. °· You have bloody or black, tarry stools. °MAKE SURE YOU:  °· Understand these instructions. °· Will watch your condition. °· Will get help right away if you are not doing well or get worse. °  °This information is not intended to replace advice given to you by your health care provider. Make sure you discuss any questions you have with your health care provider. °  °Document Released: 11/12/2004 Document Revised: 02/07/2013 Document Reviewed: 12/28/2012 °Elsevier Interactive Patient Education ©2016 [employer] ° °

## 2014-12-10 ENCOUNTER — Encounter: Payer: Self-pay | Admitting: Internal Medicine

## 2014-12-10 ENCOUNTER — Ambulatory Visit (INDEPENDENT_AMBULATORY_CARE_PROVIDER_SITE_OTHER): Payer: Medicare Other | Admitting: Internal Medicine

## 2014-12-10 VITALS — BP 108/64 | HR 64 | Temp 97.7°F | Resp 16 | Ht 74.0 in | Wt 204.8 lb

## 2014-12-10 DIAGNOSIS — I951 Orthostatic hypotension: Secondary | ICD-10-CM

## 2014-12-10 NOTE — Progress Notes (Signed)
Subjective:    Patient ID: Eugene Friedman, male    DOB: [birth year], 81 y.o.   MRN: [id number]  HPI This very nice 81 yo WWM [place] with hx/o HTN and neg Cardiolyte (2011) presents w/2-3 episodes of postural orthostasis, usually occuring in the am upon arising. In June he relates he was advised to increase his Doxazosin 4mg  qhs to bid altho he denies any sx's of prostatism. Denies syncope, palpitations, dyspnea, diaphoresis or other respiratory, CV or Neuro sx's.  Recent BMET show a sl rise in Creat from 1.36 to 1.47. Medication Sig  . amoxicillin-clavulanate (AUGMENTIN) 875-125 MG tablet Take 1 tablet by mouth 2 (two) times daily. One po bid x 7 days  . aspirin EC 81 MG tablet Take 81 mg by mouth daily.  . bisoprolol-hydrochlorothiazide (ZIAC) 5-6.25 MG tablet Take 1 tablet by mouth daily. Patient takes if BP is greater than 140.  Marland Kitchen Cholecalciferol (VITAMIN D-3) 5000 UNITS TABS Take by mouth.  . doxazosin (CARDURA) 4 MG tablet Take 1 tablet (4 mg total) by mouth daily. (Patient taking differently: Take 4 mg by mouth 2 (two) times daily. )  . finasteride (PROSCAR) 5 MG tablet Take 1 tablet (5 mg total) by mouth daily.  . hyoscyamine (LEVSIN SL) 0.125 MG SL tablet Take 1 to 2 tablets 3 to 4 x day if needed for Nausea, vomiting, cramping or diarrhea  . hyoscyamine (LEVSIN, ANASPAZ) 0.125 MG tablet Take 0.125 mg by mouth every 6 (six) hours as needed.  Javier Docker Oil 1000 MG CAPS Take 1,000 mg by mouth daily.  . LUTEIN PO Take 5 mg by mouth 2 (two) times daily.  Marland Kitchen neomycin-polymyxin-hydrocortisone (CORTISPORIN) 3.5-10000-1 otic suspension Instill 3 to 4 drops into affected ear canal up to 3-4 x daily  . tadalafil (CIALIS) 10 MG tablet 1/2-1 pill daily as directed.   Allergies  Allergen Reactions  . Adhesive [Tape]   . Celebrex [Celecoxib] Other (See Comments)    GI upset  . Lipitor [Atorvastatin]   . Nickel   . Ppd [Tuberculin Purified Protein Derivative] Other (See Comments)    Positive PPD    Past Medical History  Diagnosis Date  . Hypertension   . Hyperlipidemia   . Palpitations   . Sinus bradycardia   . DJD (degenerative joint disease)   . Vitamin D deficiency   . Allergic rhinitis   . Cancer [facility])     prostate   Past Surgical History  Procedure Laterality Date  . Cervical neck mass   07/08/06    Left Excision left posterior cervical neck mass (2.5 cm). SURGEON:  Earnstine Regal, M.D., Allegra Grana 07/08/06 and 12/05/07  . Appendectomy    . Mastoidectomy Left   . Flexible sigmoidoscopy     Review of Systems 10 point systems review negative except as above.    Objective:   Physical Exam  BP 108/64 mmHg  Pulse 64  Temp(Src) 97.7 F (36.5 C)  Resp 16  Ht 6\' 2"  (1.88 m)  Wt 204 lb 12.8 oz (92.897 kg)  BMI 26.28 kg/m2  BP 108/64, P 64 sitting and 100/56, P 76 standing  HEENT - Eac's patent. TM's Nl. EOM's full. PERRLA. NasoOroPharynx clear. Neck - supple. Nl Thyroid. Carotids 2+ & No bruits, nodes, JVD Chest - Clear equal BS w/o Rales, rhonchi, wheezes. Cor - Nl HS. RRR w/o sig MGR. PP 1(+). No edema. Abd - No palpable organomegaly, masses or tenderness. BS nl. MS- FROM w/o deformities. Muscle power, tone  and bulk Nl. Gait Nl. Neuro - No obvious Cr N abnormalities. Sensory, motor and Cerebellar functions appear Nl w/o focal abnormalities. Psyche - Mental status normal & appropriate.  No delusions, ideations or obvious mood abnormalities.    Assessment & Plan:   1. Postural hypotension  - advised increase fluids to 4-5 bottles water /day, decrease his Ziac-5 to 1/2 tab daily and drop his Doxazosin back to 4 mg qhs only and continue BP monitoring.

## 2014-12-10 NOTE — Patient Instructions (Signed)
Please cut your Ziac (Bisoprolol/Hctz) in 1/2  ++++++++++++++++++++++++++++++++++++  Change Cardura (Doxazosin) 8 mg to 1/2 tablet at bedtime only   None in the morning

## 2014-12-13 ENCOUNTER — Encounter: Payer: Self-pay | Admitting: Internal Medicine

## 2014-12-13 ENCOUNTER — Ambulatory Visit (INDEPENDENT_AMBULATORY_CARE_PROVIDER_SITE_OTHER): Payer: Medicare Other | Admitting: Internal Medicine

## 2014-12-13 VITALS — BP 138/70 | HR 74 | Temp 98.0°F | Resp 18 | Ht 74.0 in | Wt 205.0 lb

## 2014-12-13 DIAGNOSIS — R1032 Left lower quadrant pain: Secondary | ICD-10-CM | POA: Diagnosis not present

## 2014-12-13 MED ORDER — AMOXICILLIN-POT CLAVULANATE 875-125 MG PO TABS: 1.0000 | ORAL_TABLET | Freq: Two times a day (BID) | ORAL | Status: DC

## 2014-12-13 NOTE — Progress Notes (Signed)
   Subjective:    Patient ID: Eugene Friedman, male    DOB: 09-Aug-1928, 81 y.o.   MRN: [id number]  HPI  Patient returns to the office for evaluation of left sided lower abdominal pain.  Patient reports that he has finished his augmentin x 7 days and feels like the pain is signficantly less than pain and he reports that he is no longer having black colored stools as he is still taking pepto bismol several swigs prior to meals.  He reports that he has much lighter symptoms but it is still there.    Review of Systems  Constitutional: Negative for fever and chills.  Cardiovascular: Negative for chest pain and palpitations.  Gastrointestinal: Negative for nausea, vomiting, abdominal pain, diarrhea, constipation and abdominal distention.  Genitourinary: Negative for dysuria, urgency, hematuria and penile pain.       Objective:   Physical Exam  Constitutional: He is oriented to person, place, and time. He appears well-developed and well-nourished. No distress.  HENT:  Head: Normocephalic.  Mouth/Throat: Oropharynx is clear and moist. No oropharyngeal exudate.  Eyes: Conjunctivae are normal. No scleral icterus.  Neck: Normal range of motion. Neck supple. No JVD present. No thyromegaly present.  Cardiovascular: Normal rate, regular rhythm and intact distal pulses.  Exam reveals no gallop and no friction rub.   No murmur heard. Pulmonary/Chest: Effort normal and breath sounds normal. No respiratory distress. He has no wheezes. He has no rales. He exhibits no tenderness.  Abdominal: Soft. Bowel sounds are normal. He exhibits no distension and no mass. There is no tenderness. There is no rebound and no guarding.  Musculoskeletal: Normal range of motion.  Lymphadenopathy:    He has no cervical adenopathy.  Neurological: He is alert and oriented to person, place, and time.  Skin: Skin is warm and dry. He is not diaphoretic.  Psychiatric: He has a normal mood and affect. His behavior is normal.  Judgment and thought content normal.  Nursing note and vitals reviewed.   Filed Vitals:   12/13/14 1538  BP: 138/70  Pulse: 74  Temp: 98 F (36.7 C)  Resp: 18         Assessment & Plan:    1. LLQ pain -? undertreated with abx.  -if no relief with 5 further days augmentin need to consider imaging.  With history of cardiac issues wonder whether this could be postprandial ischemia -stop pepto bismol - amoxicillin-clavulanate (AUGMENTIN) 875-125 MG tablet; Take 1 tablet by mouth 2 (two) times daily. One po bid x 5 days  Dispense: 10 tablet; Refill: 0 - CBC with Differential/Platelet - BASIC METABOLIC PANEL WITH GFR - Hepatic function panel - Urinalysis, Reflex Microscopic - Urine culture

## 2014-12-14 LAB — HEPATIC FUNCTION PANEL
ALK PHOS: 77 U/L (ref 40–115)
ALT: 17 U/L (ref 9–46)
AST: 22 U/L (ref 10–35)
Albumin: 4 g/dL (ref 3.6–5.1)
BILIRUBIN INDIRECT: 0.5 mg/dL (ref 0.2–1.2)
BILIRUBIN TOTAL: 0.6 mg/dL (ref 0.2–1.2)
Bilirubin, Direct: 0.1 mg/dL (ref <=0.2)
TOTAL PROTEIN: 6.6 g/dL (ref 6.1–8.1)

## 2014-12-14 LAB — BASIC METABOLIC PANEL WITH GFR
BUN: 26 mg/dL — ABNORMAL HIGH (ref 7–25)
CHLORIDE: 105 mmol/L (ref 98–110)
CO2: 24 mmol/L (ref 20–31)
Calcium: 9.2 mg/dL (ref 8.6–10.3)
Creat: 1.44 mg/dL — ABNORMAL HIGH (ref 0.70–1.11)
GFR, EST AFRICAN AMERICAN: 51 mL/min — AB (ref >=60)
GFR, EST NON AFRICAN AMERICAN: 44 mL/min — AB (ref >=60)
GLUCOSE: 86 mg/dL (ref 65–99)
POTASSIUM: 4.5 mmol/L (ref 3.5–5.3)
Sodium: 138 mmol/L (ref 135–146)

## 2014-12-14 LAB — CBC WITH DIFFERENTIAL/PLATELET
BASOS ABS: 0 10*3/uL (ref 0.0–0.1)
Basophils Relative: 0 % (ref 0–1)
EOS PCT: 2 % (ref 0–5)
Eosinophils Absolute: 0.1 10*3/uL (ref 0.0–0.7)
HEMATOCRIT: 37.4 % — AB (ref 39.0–52.0)
Hemoglobin: 12.7 g/dL — ABNORMAL LOW (ref 13.0–17.0)
LYMPHS ABS: 1.2 10*3/uL (ref 0.7–4.0)
LYMPHS PCT: 19 % (ref 12–46)
MCH: 30.4 pg (ref 26.0–34.0)
MCHC: 34 g/dL (ref 30.0–36.0)
MCV: 89.5 fL (ref 78.0–100.0)
MPV: 9 fL (ref 8.6–12.4)
Monocytes Absolute: 0.8 10*3/uL (ref 0.1–1.0)
Monocytes Relative: 12 % (ref 3–12)
NEUTROS ABS: 4.3 10*3/uL (ref 1.7–7.7)
NEUTROS PCT: 67 % (ref 43–77)
Platelets: 237 10*3/uL (ref 150–400)
RBC: 4.18 MIL/uL — AB (ref 4.22–5.81)
RDW: 13.9 % (ref 11.5–15.5)
WBC: 6.4 10*3/uL (ref 4.0–10.5)

## 2014-12-14 LAB — URINALYSIS, ROUTINE W REFLEX MICROSCOPIC
BILIRUBIN URINE: NEGATIVE
GLUCOSE, UA: NEGATIVE
Hgb urine dipstick: NEGATIVE
Ketones, ur: NEGATIVE
LEUKOCYTES UA: NEGATIVE
Nitrite: NEGATIVE
PH: 5.5 (ref 5.0–8.0)
Protein, ur: NEGATIVE
Specific Gravity, Urine: 1.01 (ref 1.001–1.035)

## 2014-12-14 LAB — URINE CULTURE
Colony Count: NO GROWTH
Organism ID, Bacteria: NO GROWTH

## 2015-01-08 ENCOUNTER — Other Ambulatory Visit: Payer: Self-pay | Admitting: Internal Medicine

## 2015-02-04 DIAGNOSIS — L820 Inflamed seborrheic keratosis: Secondary | ICD-10-CM | POA: Diagnosis not present

## 2015-02-04 DIAGNOSIS — L570 Actinic keratosis: Secondary | ICD-10-CM | POA: Diagnosis not present

## 2015-02-04 DIAGNOSIS — D692 Other nonthrombocytopenic purpura: Secondary | ICD-10-CM | POA: Diagnosis not present

## 2015-02-04 DIAGNOSIS — D485 Neoplasm of uncertain behavior of skin: Secondary | ICD-10-CM | POA: Diagnosis not present

## 2015-02-04 DIAGNOSIS — D045 Carcinoma in situ of skin of trunk: Secondary | ICD-10-CM | POA: Diagnosis not present

## 2015-02-04 DIAGNOSIS — D1801 Hemangioma of skin and subcutaneous tissue: Secondary | ICD-10-CM | POA: Diagnosis not present

## 2015-02-04 DIAGNOSIS — L821 Other seborrheic keratosis: Secondary | ICD-10-CM | POA: Diagnosis not present

## 2015-02-21 ENCOUNTER — Ambulatory Visit (INDEPENDENT_AMBULATORY_CARE_PROVIDER_SITE_OTHER): Payer: Medicare Other | Admitting: Internal Medicine

## 2015-02-21 ENCOUNTER — Encounter: Payer: Self-pay | Admitting: Internal Medicine

## 2015-02-21 VITALS — BP 122/68 | HR 56 | Temp 98.2°F | Resp 16 | Ht 74.0 in | Wt 208.0 lb

## 2015-02-21 DIAGNOSIS — Z79899 Other long term (current) drug therapy: Secondary | ICD-10-CM

## 2015-02-21 DIAGNOSIS — E785 Hyperlipidemia, unspecified: Secondary | ICD-10-CM

## 2015-02-21 DIAGNOSIS — I10 Essential (primary) hypertension: Secondary | ICD-10-CM | POA: Diagnosis not present

## 2015-02-21 DIAGNOSIS — R7303 Prediabetes: Secondary | ICD-10-CM | POA: Diagnosis not present

## 2015-02-21 DIAGNOSIS — E559 Vitamin D deficiency, unspecified: Secondary | ICD-10-CM

## 2015-02-21 DIAGNOSIS — R7309 Other abnormal glucose: Secondary | ICD-10-CM | POA: Diagnosis not present

## 2015-02-21 LAB — HEPATIC FUNCTION PANEL
ALK PHOS: 53 U/L (ref 40–115)
ALT: 22 U/L (ref 9–46)
AST: 22 U/L (ref 10–35)
Albumin: 4.3 g/dL (ref 3.6–5.1)
BILIRUBIN DIRECT: 0.1 mg/dL (ref <=0.2)
BILIRUBIN INDIRECT: 0.5 mg/dL (ref 0.2–1.2)
TOTAL PROTEIN: 6.7 g/dL (ref 6.1–8.1)
Total Bilirubin: 0.6 mg/dL (ref 0.2–1.2)

## 2015-02-21 LAB — LIPID PANEL
CHOL/HDL RATIO: 2.9 ratio (ref <=5.0)
CHOLESTEROL: 213 mg/dL — AB (ref 125–200)
HDL: 73 mg/dL (ref >=40)
LDL Cholesterol: 114 mg/dL (ref <130)
Triglycerides: 131 mg/dL (ref <150)
VLDL: 26 mg/dL (ref <30)

## 2015-02-21 LAB — CBC WITH DIFFERENTIAL/PLATELET
BASOS ABS: 0 10*3/uL (ref 0.0–0.1)
BASOS PCT: 0 % (ref 0–1)
Eosinophils Absolute: 0.1 10*3/uL (ref 0.0–0.7)
Eosinophils Relative: 3 % (ref 0–5)
HEMATOCRIT: 39.7 % (ref 39.0–52.0)
HEMOGLOBIN: 13.1 g/dL (ref 13.0–17.0)
LYMPHS PCT: 26 % (ref 12–46)
Lymphs Abs: 1.2 10*3/uL (ref 0.7–4.0)
MCH: 30 pg (ref 26.0–34.0)
MCHC: 33 g/dL (ref 30.0–36.0)
MCV: 91.1 fL (ref 78.0–100.0)
MPV: 9.5 fL (ref 8.6–12.4)
Monocytes Absolute: 0.7 10*3/uL (ref 0.1–1.0)
Monocytes Relative: 15 % — ABNORMAL HIGH (ref 3–12)
NEUTROS ABS: 2.5 10*3/uL (ref 1.7–7.7)
NEUTROS PCT: 56 % (ref 43–77)
Platelets: 215 10*3/uL (ref 150–400)
RBC: 4.36 MIL/uL (ref 4.22–5.81)
RDW: 14.5 % (ref 11.5–15.5)
WBC: 4.5 10*3/uL (ref 4.0–10.5)

## 2015-02-21 LAB — BASIC METABOLIC PANEL WITH GFR
BUN: 28 mg/dL — ABNORMAL HIGH (ref 7–25)
CO2: 24 mmol/L (ref 20–31)
Calcium: 9.2 mg/dL (ref 8.6–10.3)
Chloride: 102 mmol/L (ref 98–110)
Creat: 1.34 mg/dL — ABNORMAL HIGH (ref 0.70–1.11)
GFR, EST AFRICAN AMERICAN: 56 mL/min — AB (ref >=60)
GFR, EST NON AFRICAN AMERICAN: 48 mL/min — AB (ref >=60)
GLUCOSE: 86 mg/dL (ref 65–99)
POTASSIUM: 5 mmol/L (ref 3.5–5.3)
SODIUM: 136 mmol/L (ref 135–146)

## 2015-02-21 LAB — TSH: TSH: 3.348 u[IU]/mL (ref 0.350–4.500)

## 2015-02-21 NOTE — Patient Instructions (Addendum)
Please try using Refresh Optive lubricating drops.  You can also try using the gel drops especially at night time to help with your dry eyes.    You can try using compression stockings that are knee high which will help with preventing swelling.    Please use Ayr gel which is a saline gel that you can get in the cough and cold section of the pharmacy and this can help moisturize your nose and prevent nose bleeding.

## 2015-02-21 NOTE — Progress Notes (Signed)
Patient ID: Eugene Friedman, male   DOB: 18-Sep-1928, 82 y.o.   MRN: [id number]  Assessment and Plan:  Hypertension:  -half tablet of ziac in the afternoon -Continue medication,  -monitor blood pressure at home.  -Continue DASH diet.   -Reminder to go to the ER if any CP, SOB, nausea, dizziness, severe HA, changes vision/speech, left arm numbness and tingling, and jaw pain.  Cholesterol: -Continue diet and exercise.  -Check cholesterol.   Pre-diabetes: -Continue diet and exercise.  -Check A1C  Vitamin D Def: -check level -continue medications.   Dry eye -refresh eye drops  Nosebleeds -ayr gel  Continue diet and meds as discussed. Further disposition pending results of labs.  HPI 82 y.o. male  presents for 3 month follow up with hypertension, hyperlipidemia, prediabetes and vitamin D.   His blood pressure has been controlled at home, today their BP is BP: 122/68 mmHg.   He does workout. He denies chest pain, shortness of breath, dizziness.  He rpeorts that his blood pressure is running around 110/60's and then in the afternoons he is 120-130/70.  At bedtime he reports that his blood pressure is 140/70s.  He reports that he is now taking his blood pressure medication in the middle of the afternoon.  He reports that he is taking a half tablet.     He is not on cholesterol medication and denies myalgias. His cholesterol is at goal. The cholesterol last visit was:   Lab Results  Component Value Date   CHOL 206* 11/20/2014   HDL 68 11/20/2014   LDLCALC 112 11/20/2014   TRIG 128 11/20/2014   CHOLHDL 3.0 11/20/2014     He has been working on diet and exercise for prediabetes, and denies foot ulcerations, hyperglycemia, hypoglycemia , increased appetite, nausea, paresthesia of the feet, polydipsia, polyuria, visual disturbances, vomiting and weight loss. Last A1C in the office was:  Lab Results  Component Value Date   HGBA1C 5.6 11/20/2014    Patient is on Vitamin D supplement.   Lab Results  Component Value Date   VD25OH 63 11/20/2014      Current Medications:  Current Outpatient Prescriptions on File Prior to Visit  Medication Sig Dispense Refill  . aspirin EC 81 MG tablet Take 81 mg by mouth daily.    . bisoprolol-hydrochlorothiazide (ZIAC) 5-6.25 MG tablet Take 1 tablet by mouth daily. Patient takes if BP is greater than 140.    Marland Kitchen Cholecalciferol (VITAMIN D-3) 5000 UNITS TABS Take by mouth.    . doxazosin (CARDURA) 4 MG tablet Take 1 tablet (4 mg total) by mouth daily. 90 tablet 2  . finasteride (PROSCAR) 5 MG tablet Take 1 tablet (5 mg total) by mouth daily. 90 tablet 2  . hyoscyamine (LEVSIN SL) 0.125 MG SL tablet DISSOLVE 1 TO 2 TABLETS UNDER THE TONGUE 3 TO 4 TIMES A DAY AS NEEDED FOR NAUSEA OR VOMITING OR CRAMPING OR DIARRHEA 90 tablet 0  . Krill Oil 1000 MG CAPS Take 1,000 mg by mouth daily.    . LUTEIN PO Take 5 mg by mouth 2 (two) times daily.    . tadalafil (CIALIS) 10 MG tablet 1/2-1 pill daily as directed. 60 tablet 3   No current facility-administered medications on file prior to visit.    Medical History:  Past Medical History  Diagnosis Date  . Hypertension   . Hyperlipidemia   . Palpitations   . Sinus bradycardia   . DJD (degenerative joint disease)   . Vitamin D  deficiency   . Allergic rhinitis   . Cancer [facility])     prostate    Allergies:  Allergies  Allergen Reactions  . Adhesive [Tape]   . Celebrex [Celecoxib] Other (See Comments)    GI upset  . Lipitor [Atorvastatin]   . Nickel   . Ppd [Tuberculin Purified Protein Derivative] Other (See Comments)    Positive PPD     Review of Systems:  Review of Systems  Constitutional: Negative for fever, chills and malaise/fatigue.  HENT: Negative for congestion, ear pain and sore throat.   Eyes: Negative.   Respiratory: Negative for cough, shortness of breath and wheezing.   Cardiovascular: Negative for chest pain, palpitations and leg swelling.  Gastrointestinal: Negative for  heartburn, diarrhea, constipation, blood in stool and melena.  Genitourinary: Negative.   Skin: Negative.   Neurological: Negative for dizziness, sensory change and headaches.  Psychiatric/Behavioral: Negative for depression. The patient is not nervous/anxious.     Family history- Review and unchanged  Social history- Review and unchanged  Physical Exam: BP 122/68 mmHg  Pulse 56  Temp(Src) 98.2 F (36.8 C) (Temporal)  Resp 16  Ht 6\' 2"  (1.88 m)  Wt 208 lb (94.348 kg)  BMI 26.69 kg/m2 Wt Readings from Last 3 Encounters:  02/21/15 208 lb (94.348 kg)  12/13/14 205 lb (92.987 kg)  12/10/14 204 lb 12.8 oz (92.897 kg)    General Appearance: Well nourished well developed, in no apparent distress. Eyes: PERRLA, EOMs, conjunctiva no swelling or erythema ENT/Mouth: Ear canals normal without obstruction, swelling, erythma, discharge.  TMs normal bilaterally.  Oropharynx moist, clear, without exudate, or postoropharyngeal swelling. Neck: Supple, thyroid normal,no cervical adenopathy  Respiratory: Respiratory effort normal, Breath sounds clear A&P without rhonchi, wheeze, or rale.  No retractions, no accessory usage. Cardio: RRR with no MRGs. Brisk peripheral pulses without edema.  Abdomen: Soft, + BS,  Non tender, no guarding, rebound, hernias, masses. Musculoskeletal: Full ROM, 5/5 strength, Normal gait Skin: Warm, dry without rashes, lesions, ecchymosis.  Neuro: Awake and oriented X 3, Cranial nerves intact. Normal muscle tone, no cerebellar symptoms. Psych: Normal affect, Insight and Judgment appropriate.    Starlyn Skeans, PA-C 11:00 AM [facility]

## 2015-02-22 LAB — HEMOGLOBIN A1C
Hgb A1c MFr Bld: 5.4 % (ref <5.7)
MEAN PLASMA GLUCOSE: 108 mg/dL (ref <117)

## 2015-02-25 ENCOUNTER — Other Ambulatory Visit: Payer: Self-pay | Admitting: *Deleted

## 2015-02-25 DIAGNOSIS — Z1212 Encounter for screening for malignant neoplasm of rectum: Secondary | ICD-10-CM

## 2015-02-25 LAB — POC HEMOCCULT BLD/STL (HOME/3-CARD/SCREEN)
Card #3 Fecal Occult Blood, POC: NEGATIVE
FECAL OCCULT BLD: NEGATIVE
Fecal Occult Blood, POC: NEGATIVE

## 2015-03-12 DIAGNOSIS — H353130 Nonexudative age-related macular degeneration, bilateral, stage unspecified: Secondary | ICD-10-CM | POA: Diagnosis not present

## 2015-03-12 DIAGNOSIS — H26491 Other secondary cataract, right eye: Secondary | ICD-10-CM | POA: Diagnosis not present

## 2015-04-23 DIAGNOSIS — H353132 Nonexudative age-related macular degeneration, bilateral, intermediate dry stage: Secondary | ICD-10-CM | POA: Diagnosis not present

## 2015-04-23 DIAGNOSIS — H524 Presbyopia: Secondary | ICD-10-CM | POA: Diagnosis not present

## 2015-06-21 ENCOUNTER — Ambulatory Visit (INDEPENDENT_AMBULATORY_CARE_PROVIDER_SITE_OTHER): Payer: Medicare Other | Admitting: Internal Medicine

## 2015-06-21 ENCOUNTER — Encounter: Payer: Self-pay | Admitting: Internal Medicine

## 2015-06-21 ENCOUNTER — Other Ambulatory Visit: Payer: Self-pay | Admitting: *Deleted

## 2015-06-21 VITALS — BP 132/76 | HR 72 | Temp 97.7°F | Resp 16 | Ht 74.0 in | Wt 210.6 lb

## 2015-06-21 DIAGNOSIS — E559 Vitamin D deficiency, unspecified: Secondary | ICD-10-CM | POA: Diagnosis not present

## 2015-06-21 DIAGNOSIS — R7303 Prediabetes: Secondary | ICD-10-CM

## 2015-06-21 DIAGNOSIS — Z79899 Other long term (current) drug therapy: Secondary | ICD-10-CM

## 2015-06-21 DIAGNOSIS — R7309 Other abnormal glucose: Secondary | ICD-10-CM | POA: Diagnosis not present

## 2015-06-21 DIAGNOSIS — I10 Essential (primary) hypertension: Secondary | ICD-10-CM | POA: Diagnosis not present

## 2015-06-21 DIAGNOSIS — I209 Angina pectoris, unspecified: Secondary | ICD-10-CM

## 2015-06-21 DIAGNOSIS — E785 Hyperlipidemia, unspecified: Secondary | ICD-10-CM | POA: Diagnosis not present

## 2015-06-21 DIAGNOSIS — R972 Elevated prostate specific antigen [PSA]: Secondary | ICD-10-CM | POA: Diagnosis not present

## 2015-06-21 LAB — BASIC METABOLIC PANEL WITH GFR
BUN: 26 mg/dL — ABNORMAL HIGH (ref 7–25)
CHLORIDE: 102 mmol/L (ref 98–110)
CO2: 24 mmol/L (ref 20–31)
Calcium: 9.2 mg/dL (ref 8.6–10.3)
Creat: 1.23 mg/dL — ABNORMAL HIGH (ref 0.70–1.11)
GFR, EST NON AFRICAN AMERICAN: 54 mL/min — AB (ref >=60)
GFR, Est African American: 62 mL/min (ref >=60)
GLUCOSE: 93 mg/dL (ref 65–99)
Potassium: 4.6 mmol/L (ref 3.5–5.3)
SODIUM: 137 mmol/L (ref 135–146)

## 2015-06-21 LAB — CBC WITH DIFFERENTIAL/PLATELET
BASOS ABS: 86 {cells}/uL (ref 0–200)
Basophils Relative: 2 %
EOS PCT: 4 %
Eosinophils Absolute: 172 cells/uL (ref 15–500)
HEMATOCRIT: 40.6 % (ref 38.5–50.0)
HEMOGLOBIN: 13.8 g/dL (ref 13.2–17.1)
LYMPHS ABS: 946 {cells}/uL (ref 850–3900)
Lymphocytes Relative: 22 %
MCH: 30.5 pg (ref 27.0–33.0)
MCHC: 34 g/dL (ref 32.0–36.0)
MCV: 89.6 fL (ref 80.0–100.0)
MONO ABS: 602 {cells}/uL (ref 200–950)
MPV: 9.8 fL (ref 7.5–12.5)
Monocytes Relative: 14 %
NEUTROS ABS: 2494 {cells}/uL (ref 1500–7800)
NEUTROS PCT: 58 %
Platelets: 223 10*3/uL (ref 140–400)
RBC: 4.53 MIL/uL (ref 4.20–5.80)
RDW: 13.9 % (ref 11.0–15.0)
WBC: 4.3 10*3/uL (ref 3.8–10.8)

## 2015-06-21 LAB — LIPID PANEL
CHOL/HDL RATIO: 2.5 ratio (ref <=5.0)
CHOLESTEROL: 205 mg/dL — AB (ref 125–200)
HDL: 83 mg/dL (ref >=40)
LDL Cholesterol: 98 mg/dL (ref <130)
Triglycerides: 118 mg/dL (ref <150)
VLDL: 24 mg/dL (ref <30)

## 2015-06-21 LAB — HEPATIC FUNCTION PANEL
ALT: 23 U/L (ref 9–46)
AST: 23 U/L (ref 10–35)
Albumin: 4.3 g/dL (ref 3.6–5.1)
Alkaline Phosphatase: 58 U/L (ref 40–115)
BILIRUBIN DIRECT: 0.1 mg/dL (ref <=0.2)
BILIRUBIN INDIRECT: 0.6 mg/dL (ref 0.2–1.2)
TOTAL PROTEIN: 6.8 g/dL (ref 6.1–8.1)
Total Bilirubin: 0.7 mg/dL (ref 0.2–1.2)

## 2015-06-21 LAB — HEMOGLOBIN A1C
HEMOGLOBIN A1C: 5.4 % (ref <5.7)
MEAN PLASMA GLUCOSE: 108 mg/dL

## 2015-06-21 LAB — MAGNESIUM: MAGNESIUM: 2.2 mg/dL (ref 1.5–2.5)

## 2015-06-21 MED ORDER — NEOMYCIN-POLYMYXIN-HC 3.5-10000-1 OT SUSP: 4.0000 [drp] | Freq: Four times a day (QID) | OTIC | Status: DC

## 2015-06-21 NOTE — Patient Instructions (Signed)

## 2015-06-21 NOTE — Progress Notes (Signed)
Patient ID: Eugene Friedman, male   DOB: 11-Jul-1928, 82 y.o.   MRN: [id number]   [facility]IC[facility]               [name], M.D.        Uvaldo Bristle. Silverio Lay, P.A.-C       Starlyn Skeans, P.A.-C   [facility] [address] [place], [place] [id number] Telephone [phone] Telefax [phone] _________________________________     This very nice 82 y.o. WWM ([place])  presents for 6 month follow up with Hypertension, Hyperlipidemia, Pre-Diabetes and Vitamin D Deficiency. Pt also has hx/o indolent low grade Prostate Ca being actively monitored by Dr Gaynelle Arabian. Patient expresses his plans to travel to [place] in 1 month for a 3 month holiday.     Patient is treated for HTN circa 2001 & BP has been controlled at home. Today's BP: 132/76 mmHg. Patient reports recent "heaviness" in his chest walking up an incline. In 2011 he had a negative Cardiolite by Dr Cathie Olden.  Patient denies any complaints of palpitations, dyspnea/orthopnea/PND, dizziness, claudication or dependent edema.   Hyperlipidemia is not controlled with diet. Patient denies myalgias or other med SE's. Last Lipids were not at goal with  Cholesterol 213*; HDL 73; LDL 114; Triglycerides 131 on 02/21/2015.    Also, the patient has history of PreDiabetes and has had no symptoms of reactive hypoglycemia, diabetic polys, paresthesias or visual blurring.  Last A1c was  5.4% on 02/21/2015.    Further, the patient also has history of Vitamin D Deficiency and supplements vitamin D without any suspected side-effects. Last vitamin D was  63 on 11/20/2014.  Medication Sig  . aspirin EC 81 MG  Take 81 mg by mouth daily.  . bisoprolol-hctz 5-6.25  Take 1 tablet by mouth daily. Patient takes if BP is greater than 140.  Marland Kitchen VITAMIN D 5000 UNITS  Take by mouth.  . doxazosin 4 MG  Take 1 tablet (4 mg total) by mouth daily.  . finasteride  5 MG  Take 1 tablet (5 mg total) by mouth daily.  Marland Kitchen  Hyoscyamine- SL 0.125 MG SL  DISSOLVE 1 TO 2 TABLETS UNDER THE TONGUE 3 TO 4 TIMES A DAY AS NEEDED  . Krill Oil 1000 MG  Take 1,000 mg by mouth daily.  . LUTEIN PO Take 5 mg by mouth 2 (two) times daily.  . tadalafil (CIALIS) 10 MG  1/2-1 pill daily as directed.   Allergies  Allergen Reactions  . Adhesive [Tape]   . Celebrex [Celecoxib] Other (See Comments)    GI upset  . Lipitor [Atorvastatin]   . Nickel   . Ppd [Tuberculin Purified Protein Derivative] Other (See Comments)    Positive PPD   PMHx:   Past Medical History  Diagnosis Date  . Hypertension   . Hyperlipidemia   . Palpitations   . Sinus bradycardia   . DJD (degenerative joint disease)   . Vitamin D deficiency   . Allergic rhinitis   . Cancer [facility])     prostate   Immunization History  Administered Date(s) Administered  . H1N1 02/17/2007  . Influenza Whole 12/05/2012  . Influenza, High Dose Seasonal PF 11/14/2013, 11/20/2014  . Influenza-Unspecified 12/05/2012  . Pneumococcal Conjugate-13 12/05/2014  . Pneumococcal Polysaccharide-23 03/08/2008, 05/18/2013  . Tdap 02/17/2008  . Zoster 02/16/2005   Past Surgical History  Procedure Laterality Date  . Cervical neck mass   07/08/06    Left Excision left  posterior cervical neck mass (2.5 cm). SURGEON:  Earnstine Regal, M.D., Allegra Grana 07/08/06 and 12/05/07  . Appendectomy    . Mastoidectomy Left   . Flexible sigmoidoscopy     FHx:    Reviewed / unchanged  SHx:    Reviewed / unchanged  Systems Review:  Constitutional: Denies fever, chills, wt changes, headaches, insomnia, fatigue, night sweats, change in appetite. Eyes: Denies redness, blurred vision, diplopia, discharge, itchy, watery eyes.  ENT: Denies discharge, congestion, post nasal drip, epistaxis, sore throat, earache, hearing loss, dental pain, tinnitus, vertigo, sinus pain, snoring.  CV: As above. Respiratory: denies cough, dyspnea, DOE, pleurisy, hoarseness, laryngitis, wheezing.  Gastrointestinal: Denies  dysphagia, odynophagia, heartburn, reflux, water brash, abdominal pain or cramps, nausea, vomiting, bloating, diarrhea, constipation, hematemesis, melena, hematochezia  or hemorrhoids. Genitourinary: Denies dysuria, frequency, urgency, nocturia, hesitancy, discharge, hematuria or flank pain. Musculoskeletal: Denies arthralgias, myalgias, stiffness, jt. swelling, pain, limping or strain/sprain.  Skin: Denies pruritus, rash, hives, warts, acne, eczema or change in skin lesion(s). Neuro: No weakness, tremor, incoordination, spasms, paresthesia or pain. Psychiatric: Denies confusion, memory loss or sensory loss. Endo: Denies change in weight, skin or hair change.  Heme/Lymph: No excessive bleeding, bruising or enlarged lymph nodes.  Physical Exam  BP 132/76 mmHg  Pulse 72  Temp(Src) 97.7 F (36.5 C)  Resp 16  Ht 6\' 2"  (1.88 m)  Wt 210 lb 9.6 oz (95.528 kg)  BMI 27.03 kg/m2  Appears well nourished and in no distress. Eyes: PERRLA, EOMs, conjunctiva no swelling or erythema. Sinuses: No frontal/maxillary tenderness ENT/Mouth: EAC's clear, TM's nl w/o erythema, bulging. Nares clear w/o erythema, swelling, exudates. Oropharynx clear without erythema or exudates. Oral hygiene is good. Tongue normal, non obstructing. Hearing intact.  Neck: Supple. Thyroid nl. Car 2+/2+ without bruits, nodes or JVD. Chest: Respirations nl with BS clear & equal w/o rales, rhonchi, wheezing or stridor.  Cor: Heart sounds normal w/ regular rate and rhythm without sig. murmurs, gallops, clicks, or rubs. Peripheral pulses normal and equal  without edema.  Abdomen: Soft & bowel sounds normal. Non-tender w/o guarding, rebound, hernias, masses, or organomegaly.  Lymphatics: Unremarkable.  Musculoskeletal: Full ROM all peripheral extremities, joint stability, 5/5 strength, and normal gait.  Skin: Warm, dry without exposed rashes, lesions or ecchymosis apparent.  Neuro: Cranial nerves intact, reflexes equal bilaterally.  Sensory-motor testing grossly intact. Tendon reflexes grossly intact.  Pysch: Alert & oriented x 3.  Insight and judgement nl & appropriate. No ideations.  Assessment and Plan:  1. Essential hypertension  - TSH  2. Hyperlipidemia  - Lipid panel - TSH  3. Prediabetes  - Hemoglobin A1c - Insulin, random  4. Vitamin D deficiency  - VITAMIN D 25 Hydroxy   5. Elevated prostate specific antigen (PSA)  - PSA, total and free; Future - PSA, total and free  6. Medication management  - CBC with Differential/Platelet - BASIC METABOLIC PANEL WITH GFR - Hepatic function panel - Magnesium  7. Ischemic chest pain, r/o ([employer])  - Ambulatory referral to Cardiology   Recommended defer exercise until has evaluation by Dr Acie Fredrickson, otherwise continue  BP monitoring, weight control, and discussed med and SE's. Recommended labs to assess and monitor clinical status. Further disposition pending results of labs. Over 30 minutes of exam, counseling, chart review was performed

## 2015-06-22 LAB — TSH: TSH: 2.53 m[IU]/L (ref 0.40–4.50)

## 2015-06-22 LAB — PSA, TOTAL AND FREE
PSA FREE: 0.77 ng/mL
PSA, Free Pct: 23 % — ABNORMAL LOW (ref >25)
PSA: 3.3 ng/mL (ref <=4.00)

## 2015-06-22 LAB — VITAMIN D 25 HYDROXY (VIT D DEFICIENCY, FRACTURES): Vit D, 25-Hydroxy: 54 ng/mL (ref 30–100)

## 2015-06-22 LAB — INSULIN, RANDOM: Insulin: 6 u[IU]/mL (ref 2.0–19.6)

## 2015-06-30 ENCOUNTER — Other Ambulatory Visit: Payer: Self-pay | Admitting: Internal Medicine

## 2015-06-30 ENCOUNTER — Encounter: Payer: Self-pay | Admitting: Internal Medicine

## 2015-06-30 DIAGNOSIS — N400 Enlarged prostate without lower urinary tract symptoms: Secondary | ICD-10-CM

## 2015-06-30 MED ORDER — TADALAFIL 10 MG PO TABS: ORAL_TABLET | ORAL | Status: DC

## 2015-07-12 ENCOUNTER — Ambulatory Visit (INDEPENDENT_AMBULATORY_CARE_PROVIDER_SITE_OTHER): Payer: Medicare Other | Admitting: Cardiology

## 2015-07-12 ENCOUNTER — Encounter: Payer: Self-pay | Admitting: Cardiology

## 2015-07-12 VITALS — BP 120/76 | HR 76 | Ht 74.02 in | Wt 212.5 lb

## 2015-07-12 DIAGNOSIS — M25512 Pain in left shoulder: Secondary | ICD-10-CM | POA: Diagnosis not present

## 2015-07-12 DIAGNOSIS — R079 Chest pain, unspecified: Secondary | ICD-10-CM | POA: Diagnosis not present

## 2015-07-12 NOTE — Patient Instructions (Addendum)
Medication Instructions:  Continue current medications  Labwork: NONE  Testing/Procedures: Your physician has requested that you have an exercise tolerance test. For further information please visit [email or web address]. Please also follow instruction sheet, as given.  Follow-Up: As Needed  Any Other Special Instructions Will Be Listed Below (If Applicable).   If you need a refill on your cardiac medications before your next appointment, please call your pharmacy.

## 2015-07-12 NOTE — Progress Notes (Signed)
Cardiology Office Note   Date:  07/12/2015   ID:  Eugene Friedman, DOB 18-Apr-1928, MRN [id number]  PCP:  Eugene Richards, MD  Cardiologist:   Minus Breeding, MD   No chief complaint on file.     History of Present Illness: Eugene Friedman is a 82 y.o. male who presents for evaluation of shoulder discomfort. He's had no past cardiac history. He has had stress tests over the years and he's been normal. He does have some shoulder pain. And some mild chest discomfort. He's not sure whether this is his arthritis. Seems to be there most of the day except in the morning. He does not necessarily get this with activities although it is there when he does some walking. He walks routinely up inclines around his house. He does some other mild activities. With this he denies worsening chest pressure or any jaw discomfort. He thinks the shoulder pain has been stable to mildly increased. He does have some mild shortness of breath with activities. Is not describing PND or orthopnea. He's not been having any palpitations, presyncope or syncope. He's had no weight gain or edema.  Past Medical History  Diagnosis Date  . Hypertension     Borderline   . Sinus bradycardia   . DJD (degenerative joint disease)   . Vitamin D deficiency   . Allergic rhinitis   . Cancer [facility])     prostate    Past Surgical History  Procedure Laterality Date  . Cervical neck mass   07/08/06    Left Excision left posterior cervical neck mass (2.5 cm). SURGEON:  Eugene Friedman, M.D., Eugene Friedman 07/08/06 and 12/05/07  . Appendectomy    . Mastoidectomy Left   . Flexible sigmoidoscopy    . Knee surgery    . Hemorrhoid surgery       Current Outpatient Prescriptions  Medication Sig Dispense Refill  . aspirin EC 81 MG tablet Take 81 mg by mouth daily.    . bisoprolol-hydrochlorothiazide (ZIAC) 5-6.25 MG tablet Take 1 tablet by mouth daily. Patient takes if BP is greater than 140.    Marland Kitchen Cholecalciferol (VITAMIN D-3) 5000  UNITS TABS Take by mouth.    . doxazosin (CARDURA) 4 MG tablet Take 1 tablet (4 mg total) by mouth daily. 90 tablet 2  . finasteride (PROSCAR) 5 MG tablet Take 1 tablet (5 mg total) by mouth daily. 90 tablet 2  . hyoscyamine (LEVSIN SL) 0.125 MG SL tablet DISSOLVE 1 TO 2 TABLETS UNDER THE TONGUE 3 TO 4 TIMES A DAY AS NEEDED FOR NAUSEA OR VOMITING OR CRAMPING OR DIARRHEA 90 tablet 0  . Krill Oil 1000 MG CAPS Take 1,000 mg by mouth daily.    . LUTEIN PO Take 5 mg by mouth 2 (two) times daily.    Marland Kitchen neomycin-polymyxin-hydrocortisone (CORTISPORIN) 3.5-10000-1 otic suspension Place 4 drops into both ears 4 (four) times daily. 30 mL 1  . tadalafil (CIALIS) 10 MG tablet 1/2-1 pill daily as directed. 60 tablet 3   No current facility-administered medications for this visit.    Allergies:   Adhesive; Celebrex; Lipitor; Nickel; and Ppd    Social History:  The patient  reports that he has never smoked. He has never used smokeless tobacco. He reports that he drinks about 6.0 oz of alcohol per week. He reports that he does not use illicit drugs.   Family History:  The patient's family history includes Cancer in his maternal grandmother; Diabetes in his maternal aunt,  mother, and paternal aunt; Heart attack (age of onset: 63) in his father; Hypertension in his father; Stroke in his mother.    ROS:  Please see the history of present illness.   Otherwise, review of systems are positive for none.   All other systems are reviewed and negative.    PHYSICAL EXAM: VS:  BP 120/76 mmHg  Pulse 76  Ht 6' 2.02" (1.88 m)  Wt 212 lb 8 oz (96.389 kg)  BMI 27.27 kg/m2 , BMI Body mass index is 27.27 kg/(m^2). GENERAL:  Well appearing HEENT:  Pupils equal round and reactive, fundi not visualized, oral mucosa unremarkable NECK:  No jugular venous distention, waveform within normal limits, carotid upstroke brisk and symmetric, no bruits, no thyromegaly LYMPHATICS:  No cervical, inguinal adenopathy LUNGS:  Clear to  auscultation bilaterally BACK:  No CVA tenderness CHEST:  Unremarkable HEART:  PMI not displaced or sustained,S1 and S2 within normal limits, no S3, no S4, no clicks, no rubs, no murmurs ABD:  Flat, positive bowel sounds normal in frequency in pitch, no bruits, no rebound, no guarding, no midline pulsatile mass, no hepatomegaly, no splenomegaly EXT:  2 plus pulses throughout, no edema, no cyanosis no clubbing SKIN:  No rashes no nodules NEURO:  Cranial nerves II through XII grossly intact, motor grossly intact throughout PSYCH:  Cognitively intact, oriented to person place and time    EKG:  EKG is ordered today. The ekg ordered today demonstrates sinus rhythm, rate 76, axis within normal limits, intervals within normal limits, no acute ST-T wave changes.   Recent Labs: 06/21/2015: ALT 23; BUN 26*; Creat 1.23*; Hemoglobin 13.8; Magnesium 2.2; Platelets 223; Potassium 4.6; Sodium 137; TSH 2.53    Lipid Panel    Component Value Date/Time   CHOL 205* 06/21/2015 0916   TRIG 118 06/21/2015 0916   HDL 83 06/21/2015 0916   CHOLHDL 2.5 06/21/2015 0916   VLDL 24 06/21/2015 0916   LDLCALC 98 06/21/2015 0916      Wt Readings from Last 3 Encounters:  07/12/15 212 lb 8 oz (96.389 kg)  06/21/15 210 lb 9.6 oz (95.528 kg)  02/21/15 208 lb (94.348 kg)      Other studies Reviewed: Additional studies/ records that were reviewed today include: None. Review of the above records demonstrates:  Please see elsewhere in the note.     ASSESSMENT AND PLAN:  CHEST PAIN:  He does have some atypical chest pain. He has one risk factor with borderline hypertension. Given this screening with stress testing is indicated. I will bring the patient back for a POET (Plain Old Exercise Test). This will allow me to screen for obstructive coronary disease, risk stratify and very importantly provide a prescription for exercise.  HTN:  The blood pressure is at target. No change in medications is indicated. We will  continue with therapeutic lifestyle changes (TLC).   Current medicines are reviewed at length with the patient today.  The patient does not have concerns regarding medicines.  The following changes have been made:  no change  Labs/ tests ordered today include:   Orders Placed This Encounter  Procedures  . Exercise Tolerance Test  . EKG 12-Lead     Disposition:   FU with me as needed.    Signed, Minus Breeding, MD  07/12/2015 8:25 AM     [facility]

## 2015-07-16 DIAGNOSIS — C61 Malignant neoplasm of prostate: Secondary | ICD-10-CM | POA: Diagnosis not present

## 2015-07-17 ENCOUNTER — Ambulatory Visit (INDEPENDENT_AMBULATORY_CARE_PROVIDER_SITE_OTHER): Payer: Medicare Other

## 2015-07-17 DIAGNOSIS — M25512 Pain in left shoulder: Secondary | ICD-10-CM | POA: Diagnosis not present

## 2015-07-17 DIAGNOSIS — R079 Chest pain, unspecified: Secondary | ICD-10-CM

## 2015-07-17 LAB — EXERCISE TOLERANCE TEST
CHL CUP MPHR: 136 {beats}/min
CHL CUP RESTING HR STRESS: 65 {beats}/min
CHL CUP STRESS STAGE 1 HR: 76 {beats}/min
CHL CUP STRESS STAGE 2 GRADE: 0 %
CHL CUP STRESS STAGE 2 SPEED: 0.9 mph
CHL CUP STRESS STAGE 3 GRADE: 0 %
CHL CUP STRESS STAGE 3 HR: 75 {beats}/min
CHL CUP STRESS STAGE 3 SPEED: 1 mph
CHL CUP STRESS STAGE 4 DBP: 83 mmHg
CHL CUP STRESS STAGE 4 GRADE: 10 %
CHL CUP STRESS STAGE 5 HR: 126 {beats}/min
CHL CUP STRESS STAGE 6 DBP: 82 mmHg
CHL CUP STRESS STAGE 6 GRADE: 0 %
CHL CUP STRESS STAGE 6 SPEED: 0 mph
CHL CUP STRESS STAGE 7 GRADE: 0 %
CHL CUP STRESS STAGE 7 SBP: 173 mmHg
CHL CUP STRESS STAGE 7 SPEED: 0 mph
CSEPED: 6 min
CSEPEDS: 0 s
CSEPEW: 7 METS
CSEPHR: 92 %
CSEPPHR: 126 {beats}/min
Peak BP: 206 mmHg
Percent of predicted max HR: 92 %
RPE: 16
Stage 1 DBP: 85 mmHg
Stage 1 Grade: 0 %
Stage 1 SBP: 150 mmHg
Stage 1 Speed: 0 mph
Stage 2 HR: 76 {beats}/min
Stage 4 HR: 102 {beats}/min
Stage 4 SBP: 175 mmHg
Stage 4 Speed: 1.7 mph
Stage 5 DBP: 80 mmHg
Stage 5 Grade: 12 %
Stage 5 SBP: 206 mmHg
Stage 5 Speed: 2.5 mph
Stage 6 HR: 102 {beats}/min
Stage 6 SBP: 200 mmHg
Stage 7 DBP: 93 mmHg
Stage 7 HR: 71 {beats}/min

## 2015-07-22 DIAGNOSIS — L821 Other seborrheic keratosis: Secondary | ICD-10-CM | POA: Diagnosis not present

## 2015-07-22 DIAGNOSIS — Z85828 Personal history of other malignant neoplasm of skin: Secondary | ICD-10-CM | POA: Diagnosis not present

## 2015-07-22 DIAGNOSIS — L578 Other skin changes due to chronic exposure to nonionizing radiation: Secondary | ICD-10-CM | POA: Diagnosis not present

## 2015-07-22 DIAGNOSIS — D1801 Hemangioma of skin and subcutaneous tissue: Secondary | ICD-10-CM | POA: Diagnosis not present

## 2015-07-22 DIAGNOSIS — L570 Actinic keratosis: Secondary | ICD-10-CM | POA: Diagnosis not present

## 2015-07-22 DIAGNOSIS — N5201 Erectile dysfunction due to arterial insufficiency: Secondary | ICD-10-CM | POA: Diagnosis not present

## 2015-07-22 DIAGNOSIS — D692 Other nonthrombocytopenic purpura: Secondary | ICD-10-CM | POA: Diagnosis not present

## 2015-07-22 DIAGNOSIS — R972 Elevated prostate specific antigen [PSA]: Secondary | ICD-10-CM | POA: Diagnosis not present

## 2015-07-22 DIAGNOSIS — C61 Malignant neoplasm of prostate: Secondary | ICD-10-CM | POA: Diagnosis not present

## 2015-11-05 ENCOUNTER — Ambulatory Visit: Payer: Medicare Other | Admitting: Cardiovascular Disease

## 2015-11-12 DIAGNOSIS — H524 Presbyopia: Secondary | ICD-10-CM | POA: Diagnosis not present

## 2015-11-12 DIAGNOSIS — H532 Diplopia: Secondary | ICD-10-CM | POA: Diagnosis not present

## 2015-11-12 DIAGNOSIS — H353122 Nonexudative age-related macular degeneration, left eye, intermediate dry stage: Secondary | ICD-10-CM | POA: Diagnosis not present

## 2015-11-14 DIAGNOSIS — L820 Inflamed seborrheic keratosis: Secondary | ICD-10-CM | POA: Diagnosis not present

## 2015-11-14 DIAGNOSIS — D1801 Hemangioma of skin and subcutaneous tissue: Secondary | ICD-10-CM | POA: Diagnosis not present

## 2015-11-14 DIAGNOSIS — L570 Actinic keratosis: Secondary | ICD-10-CM | POA: Diagnosis not present

## 2015-11-14 DIAGNOSIS — D692 Other nonthrombocytopenic purpura: Secondary | ICD-10-CM | POA: Diagnosis not present

## 2015-11-14 DIAGNOSIS — L821 Other seborrheic keratosis: Secondary | ICD-10-CM | POA: Diagnosis not present

## 2015-11-28 ENCOUNTER — Other Ambulatory Visit: Payer: Self-pay | Admitting: Internal Medicine

## 2015-11-28 ENCOUNTER — Other Ambulatory Visit: Payer: Self-pay | Admitting: Physician Assistant

## 2015-12-16 ENCOUNTER — Encounter: Payer: Self-pay | Admitting: Internal Medicine

## 2015-12-16 ENCOUNTER — Ambulatory Visit (INDEPENDENT_AMBULATORY_CARE_PROVIDER_SITE_OTHER): Payer: Medicare Other | Admitting: Internal Medicine

## 2015-12-16 VITALS — BP 158/86 | HR 72 | Temp 97.2°F | Resp 16 | Ht 73.25 in | Wt 217.0 lb

## 2015-12-16 DIAGNOSIS — Z23 Encounter for immunization: Secondary | ICD-10-CM

## 2015-12-16 DIAGNOSIS — R7309 Other abnormal glucose: Secondary | ICD-10-CM | POA: Diagnosis not present

## 2015-12-16 DIAGNOSIS — R7303 Prediabetes: Secondary | ICD-10-CM

## 2015-12-16 DIAGNOSIS — Z79899 Other long term (current) drug therapy: Secondary | ICD-10-CM | POA: Diagnosis not present

## 2015-12-16 DIAGNOSIS — E559 Vitamin D deficiency, unspecified: Secondary | ICD-10-CM | POA: Diagnosis not present

## 2015-12-16 DIAGNOSIS — Z136 Encounter for screening for cardiovascular disorders: Secondary | ICD-10-CM | POA: Diagnosis not present

## 2015-12-16 DIAGNOSIS — I10 Essential (primary) hypertension: Secondary | ICD-10-CM | POA: Diagnosis not present

## 2015-12-16 DIAGNOSIS — C61 Malignant neoplasm of prostate: Secondary | ICD-10-CM | POA: Diagnosis not present

## 2015-12-16 DIAGNOSIS — E782 Mixed hyperlipidemia: Secondary | ICD-10-CM | POA: Diagnosis not present

## 2015-12-16 DIAGNOSIS — Z1212 Encounter for screening for malignant neoplasm of rectum: Secondary | ICD-10-CM

## 2015-12-16 LAB — BASIC METABOLIC PANEL WITH GFR
BUN: 22 mg/dL (ref 7–25)
CALCIUM: 9.2 mg/dL (ref 8.6–10.3)
CO2: 26 mmol/L (ref 20–31)
CREATININE: 1.3 mg/dL — AB (ref 0.70–1.11)
Chloride: 106 mmol/L (ref 98–110)
GFR, Est African American: 58 mL/min — ABNORMAL LOW (ref >=60)
GFR, Est Non African American: 50 mL/min — ABNORMAL LOW (ref >=60)
Glucose, Bld: 85 mg/dL (ref 65–99)
Potassium: 5.1 mmol/L (ref 3.5–5.3)
SODIUM: 139 mmol/L (ref 135–146)

## 2015-12-16 LAB — TSH: TSH: 1.99 mIU/L (ref 0.40–4.50)

## 2015-12-16 LAB — CBC WITH DIFFERENTIAL/PLATELET
BASOS ABS: 59 {cells}/uL (ref 0–200)
Basophils Relative: 1 %
EOS PCT: 4 %
Eosinophils Absolute: 236 cells/uL (ref 15–500)
HCT: 41.3 % (ref 38.5–50.0)
Hemoglobin: 13.5 g/dL (ref 13.2–17.1)
LYMPHS PCT: 18 %
Lymphs Abs: 1062 cells/uL (ref 850–3900)
MCH: 30.8 pg (ref 27.0–33.0)
MCHC: 32.7 g/dL (ref 32.0–36.0)
MCV: 94.1 fL (ref 80.0–100.0)
MONOS PCT: 10 %
MPV: 9.1 fL (ref 7.5–12.5)
Monocytes Absolute: 590 cells/uL (ref 200–950)
NEUTROS PCT: 67 %
Neutro Abs: 3953 cells/uL (ref 1500–7800)
PLATELETS: 218 10*3/uL (ref 140–400)
RBC: 4.39 MIL/uL (ref 4.20–5.80)
RDW: 13.8 % (ref 11.0–15.0)
WBC: 5.9 10*3/uL (ref 3.8–10.8)

## 2015-12-16 LAB — LIPID PANEL
CHOL/HDL RATIO: 3.5 ratio (ref <=5.0)
CHOLESTEROL: 237 mg/dL — AB (ref 125–200)
HDL: 67 mg/dL (ref >=40)
LDL Cholesterol: 110 mg/dL (ref <130)
Triglycerides: 299 mg/dL — ABNORMAL HIGH (ref <150)
VLDL: 60 mg/dL — ABNORMAL HIGH (ref <30)

## 2015-12-16 LAB — HEPATIC FUNCTION PANEL
ALT: 23 U/L (ref 9–46)
AST: 23 U/L (ref 10–35)
Albumin: 4.3 g/dL (ref 3.6–5.1)
Alkaline Phosphatase: 59 U/L (ref 40–115)
BILIRUBIN DIRECT: 0.1 mg/dL (ref <=0.2)
BILIRUBIN TOTAL: 0.5 mg/dL (ref 0.2–1.2)
Indirect Bilirubin: 0.4 mg/dL (ref 0.2–1.2)
Total Protein: 6.7 g/dL (ref 6.1–8.1)

## 2015-12-16 LAB — PSA: PSA: 3 ng/mL (ref <=4.0)

## 2015-12-16 NOTE — Patient Instructions (Signed)

## 2015-12-16 NOTE — Progress Notes (Signed)
[facility]   [name], M.D.    Uvaldo Bristle. Silverio Lay, P.A.-C      Starlyn Skeans, P.A.-C  [facility]                [address] [place], [place] [id number] Telephone [phone] Telefax [phone]  Comprehensive Evaluation & Examination     This very nice 82 y.o. WWM ([place]) presents for a comprehensive evaluation and management of multiple medical co-morbidities.  Patient has been followed for HTN, Prediabetes, Hyperlipidemia and Vitamin D Deficiency. Patient is followed closely by Dr Gaynelle Arabian since 2007 for Gleason 6 Ca of Prostate.      HTN predates 2001. Patient's BP has been controlled at home. In 2011 he had a negative Cardiolite. Today's BP is 158/86. Patient denies any cardiac symptoms as chest pain, palpitations, shortness of breath, dizziness or ankle swelling.     Patient's hyperlipidemia is controlled with diet and medications. Patient denies myalgias or other medication SE's. Last lipids were at goal: Lab Results  Component Value Date   CHOL 205 (H) 06/21/2015   HDL 83 06/21/2015   LDLCALC 98 06/21/2015   TRIG 118 06/21/2015   CHOLHDL 2.5 06/21/2015      Patient has prediabetes circa 2014 (A1c 6.1%)  and patient denies reactive hypoglycemic symptoms, visual blurring, diabetic polys or paresthesias. Last A1c was at goal: Lab Results  Component Value Date   HGBA1C 5.4 06/21/2015       Finally, patient has history of Vitamin D Deficiency in 2009  of "42" and last vitamin D was low (goal 70-100) : Lab Results  Component Value Date   VD25OH 54 06/21/2015   Current Outpatient Prescriptions on File Prior to Visit  Medication Sig  . aspirin EC 81 MG tablet Take 81 mg by mouth daily.  . bisoprolol-hydrochlorothiazide (ZIAC) 5-6.25 MG tablet Take 1 tablet by mouth daily. Patient takes if BP is greater than 140.  Marland Kitchen Cholecalciferol (VITAMIN D-3) 5000 UNITS TABS Take by mouth.  .  doxazosin (CARDURA) 4 MG tablet Take 1 tablet (4 mg total) by mouth daily.  . finasteride (PROSCAR) 5 MG tablet Take 1 tablet (5 mg total) by mouth daily.  . hyoscyamine (LEVSIN SL) 0.125 MG SL tablet DISSOLVE 1 TO 2 TABLETS UNDER THE TONGUE 3 TO 4 TIMES A DAY AS NEEDED FOR NAUSEA OR VOMITING OR CRAMPING OR DIARRHEA  . Krill Oil 1000 MG CAPS Take 1,000 mg by mouth daily.  . LUTEIN PO Take 5 mg by mouth 2 (two) times daily.  Marland Kitchen neomycin-polymyxin-hydrocortisone (CORTISPORIN) 3.5-10000-1 otic suspension Place 4 drops into both ears 4 (four) times daily.  . tadalafil (CIALIS) 10 MG tablet 1/2-1 pill daily as directed.   No current facility-administered medications on file prior to visit.    Allergies  Allergen Reactions  . Adhesive [Tape]   . Celebrex [Celecoxib] Other (See Comments)    GI upset  . Lipitor [Atorvastatin]   . Nickel   . Ppd [Tuberculin Purified Protein Derivative] Other (See Comments)    Positive PPD   Past Medical History:  Diagnosis Date  . Allergic rhinitis   . Cancer [facility])    prostate  . DJD (degenerative joint disease)   . Hypertension    Borderline   . Sinus bradycardia   . Vitamin D deficiency    Health Maintenance  Topic Date Due  .  INFLUENZA VACCINE  09/17/2015  . TETANUS/TDAP  02/16/2018  . ZOSTAVAX  Completed  . PNA vac Low Risk Adult  Completed   Immunization History  Administered Date(s) Administered  . H1N1 02/17/2007  . Influenza Whole 12/05/2012  . Influenza, High Dose Seasonal PF 11/14/2013, 11/20/2014  . Influenza-Unspecified 12/05/2012  . Pneumococcal Conjugate-13 12/05/2014  . Pneumococcal Polysaccharide-23 03/08/2008, 05/18/2013  . Tdap 02/17/2008  . Zoster 02/16/2005   Past Surgical History:  Procedure Laterality Date  . APPENDECTOMY    . cervical neck mass   07/08/06   Left Excision left posterior cervical neck mass (2.5 cm). SURGEON:  Earnstine Regal, M.D., Allegra Grana 07/08/06 and 12/05/07  . FLEXIBLE SIGMOIDOSCOPY    . HEMORRHOID  SURGERY    . KNEE SURGERY    . MASTOIDECTOMY Left    Family History  Problem Relation Age of Onset  . Stroke Mother   . Diabetes Mother   . Heart attack Father 54  . Hypertension Father   . Diabetes Maternal Aunt   . Diabetes Paternal Aunt   . Cancer Maternal Grandmother    Social History   Social History  . Marital status: Widowed    Spouse name: N/A  . Number of children: N/A  . Years of education: N/A   Occupational History  . 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 Smoking status: Never Smoker  . Smokeless tobacco: Never Used  . Alcohol use 6.0 oz/week    10 Glasses of wine per week  . Drug use: No  . Sexual activity: Active    ROS Constitutional: Denies fever, chills, weight loss/gain, headaches, insomnia,  night sweats or change in appetite. Does c/o fatigue. Eyes: Denies redness, blurred vision, diplopia, discharge, itchy or watery eyes.  ENT: Denies discharge, congestion, post nasal drip, epistaxis, sore throat, earache, hearing loss, dental pain, Tinnitus, Vertigo, Sinus pain or snoring.  Cardio: Denies chest pain, palpitations, irregular heartbeat, syncope, dyspnea, diaphoresis, orthopnea, PND, claudication or edema Respiratory: denies cough, dyspnea, DOE, pleurisy, hoarseness, laryngitis or wheezing.  Gastrointestinal: Denies dysphagia, heartburn, reflux, water brash, pain, cramps, nausea, vomiting, bloating, diarrhea, constipation, hematemesis, melena, hematochezia, jaundice or hemorrhoids Genitourinary: Denies dysuria, frequency, urgency, nocturia, hesitancy, discharge, hematuria or flank pain Musculoskeletal: Denies arthralgia, myalgia, stiffness, Jt. Swelling, pain, limp or strain/sprain. Denies Falls. Skin: Denies puritis, rash, hives, warts, acne, eczema or change in skin lesion Neuro: No weakness, tremor, incoordination, spasms, paresthesia or pain Psychiatric: Denies confusion, memory loss or sensory loss. Denies  Depression. Endocrine: Denies change in weight, skin, hair change, nocturia, and paresthesia, diabetic polys, visual blurring or hyper / hypo glycemic episodes.  Heme/Lymph: No excessive bleeding, bruising or enlarged lymph nodes.  Physical Exam  BP (!) 158/86   Pulse 72   Temp 97.2 F (36.2 C)   Resp 16   Ht 6' 1.25" (1.861 m)   Wt 217 lb (98.4 kg)   BMI 28.43 kg/m   General Appearance: Well nourished, in no apparent distress.  Eyes: PERRLA, EOMs, conjunctiva no swelling or erythema, normal fundi and vessels. Sinuses: No frontal/maxillary tenderness ENT/Mouth: EACs patent / TMs  nl. Nares clear without erythema, swelling, mucoid exudates. Oral hygiene is good. No erythema, swelling, or exudate. Tongue normal, non-obstructing. Tonsils not swollen or erythematous. Hearing normal.  Neck: Supple, thyroid normal. No bruits, nodes or JVD. Respiratory: Respiratory effort normal.  BS equal and clear bilateral without rales, rhonci, wheezing or stridor. Cardio: Heart sounds are normal  with regular rate and rhythm and no murmurs, rubs or gallops. Peripheral pulses are normal and equal bilaterally without edema. No aortic or femoral bruits. Chest: symmetric with normal excursions and percussion.  Abdomen: Soft, with Nl bowel sounds. Nontender, no guarding, rebound, hernias, masses, or organomegaly.  Lymphatics: Non tender without lymphadenopathy.  Genitourinary: Deferred to Dr Gaynelle Arabian. Musculoskeletal: Full ROM all peripheral extremities, joint stability, 5/5 strength, and normal gait. Skin: Warm and dry without rashes, lesions, cyanosis, clubbing or  ecchymosis.  Neuro: Cranial nerves intact, reflexes equal bilaterally. Normal muscle tone, no cerebellar symptoms. Sensation intact.  Pysch: Alert and oriented X 3 with normal affect, insight and judgment appropriate.   Assessment and Plan  1. Essential hypertension  - Microalbumin / creatinine urine ratio - EKG 12-Lead - [place],  RETROPERITNL ABD,  LTD - TSH  2. Mixed hyperlipidemia  - EKG 12-Lead - [place], RETROPERITNL ABD,  LTD - Lipid panel - TSH  3. Prediabetes  - EKG 12-Lead - [place], RETROPERITNL ABD,  LTD - Hemoglobin A1c - Insulin, random  4. Vitamin D deficiency  - VITAMIN D 25 Hydroxy  5. Prostate cancer ([place])  - PSA  6. Screening for rectal cancer  - POC Hemoccult Bld/Stl   7. Other abnormal glucose  - Hemoglobin A1c - Insulin, random  8. Screening for ischemic heart disease  - EKG 12-Lead  9. Screening for AAA (aortic abdominal aneurysm)  - [place], RETROPERITNL ABD,  LTD  10. Medication management  - Urinalysis, Routine w reflex microscopic - CBC with Differential/Platelet - BASIC METABOLIC PANEL WITH GFR - Hepatic function panel - Magnesium  11. Need for prophylactic vaccination and inoculation against influenza  - Flu vaccine HIGH DOSE PF (Fluzone High dose)       Continue prudent diet as discussed, weight control, BP monitoring, regular exercise, and medications as discussed.  Discussed med effects and SE's. Routine screening labs and tests as requested with regular follow-up as recommended. Over 40 minutes of exam, counseling, chart review and high complex critical decision making was performed

## 2015-12-17 LAB — URINALYSIS, ROUTINE W REFLEX MICROSCOPIC
BILIRUBIN URINE: NEGATIVE
Glucose, UA: NEGATIVE
Hgb urine dipstick: NEGATIVE
KETONES UR: NEGATIVE
Leukocytes, UA: NEGATIVE
NITRITE: NEGATIVE
PROTEIN: NEGATIVE
Specific Gravity, Urine: 1.012 (ref 1.001–1.035)
pH: 6 (ref 5.0–8.0)

## 2015-12-17 LAB — MICROALBUMIN / CREATININE URINE RATIO
CREATININE, URINE: 73 mg/dL (ref 20–370)
MICROALB UR: 1.4 mg/dL
Microalb Creat Ratio: 19 mcg/mg creat (ref <30)

## 2015-12-17 LAB — VITAMIN D 25 HYDROXY (VIT D DEFICIENCY, FRACTURES): VIT D 25 HYDROXY: 45 ng/mL (ref 30–100)

## 2015-12-17 LAB — HEMOGLOBIN A1C
HEMOGLOBIN A1C: 5.1 % (ref <5.7)
Mean Plasma Glucose: 100 mg/dL

## 2015-12-17 LAB — INSULIN, RANDOM: Insulin: 8.4 u[IU]/mL (ref 2.0–19.6)

## 2015-12-17 LAB — MAGNESIUM: MAGNESIUM: 2.2 mg/dL (ref 1.5–2.5)

## 2015-12-17 NOTE — Progress Notes (Signed)
-   Vit D 45 - low - goal or ideal is between 790-100, so........Marland Kitchen Recommend increase Vit D 5,000 to 2 capsules = 10,000 units/daily   - A1c 5.1% and insulin - both normal - No Diabetes  - PSA - Normal & OK        (copy sent to Dr Gaynelle Arabian)   - Chol 237  And LDL 110 - both too high - Need stricter low cholesterol diet   - Recommend low cholesterol diet - Cholesterol only comes from animal sources - ie. meat, dairy, eggs - Eat all the vegetables you want - avoid meat, especially red meat and dairy - especially cheese.  Cheese is the most concentrated form of trans-fats which is the worst thing to clog up our arteries. Veggie cheese is OK which can be found in the fresh produce section at [employer] or [employer] or [employer]  - All Else - CBC - Kidneys - Electrolytes -  Liver - Magnesium & Thyroid all Nl/OK

## 2015-12-19 ENCOUNTER — Encounter: Payer: Self-pay | Admitting: Internal Medicine

## 2016-01-24 ENCOUNTER — Ambulatory Visit (INDEPENDENT_AMBULATORY_CARE_PROVIDER_SITE_OTHER): Payer: Medicare Other | Admitting: Physician Assistant

## 2016-01-24 ENCOUNTER — Encounter: Payer: Self-pay | Admitting: Physician Assistant

## 2016-01-24 VITALS — BP 150/90 | HR 84 | Temp 97.3°F | Resp 16 | Ht 73.25 in | Wt 214.0 lb

## 2016-01-24 DIAGNOSIS — C61 Malignant neoplasm of prostate: Secondary | ICD-10-CM | POA: Diagnosis not present

## 2016-01-24 DIAGNOSIS — Z79899 Other long term (current) drug therapy: Secondary | ICD-10-CM | POA: Diagnosis not present

## 2016-01-24 DIAGNOSIS — Z0001 Encounter for general adult medical examination with abnormal findings: Secondary | ICD-10-CM

## 2016-01-24 DIAGNOSIS — Z6825 Body mass index (BMI) 25.0-25.9, adult: Secondary | ICD-10-CM

## 2016-01-24 DIAGNOSIS — R972 Elevated prostate specific antigen [PSA]: Secondary | ICD-10-CM

## 2016-01-24 DIAGNOSIS — E782 Mixed hyperlipidemia: Secondary | ICD-10-CM

## 2016-01-24 DIAGNOSIS — R6889 Other general symptoms and signs: Secondary | ICD-10-CM | POA: Diagnosis not present

## 2016-01-24 DIAGNOSIS — E559 Vitamin D deficiency, unspecified: Secondary | ICD-10-CM | POA: Diagnosis not present

## 2016-01-24 DIAGNOSIS — R7303 Prediabetes: Secondary | ICD-10-CM | POA: Diagnosis not present

## 2016-01-24 DIAGNOSIS — I10 Essential (primary) hypertension: Secondary | ICD-10-CM | POA: Diagnosis not present

## 2016-01-24 DIAGNOSIS — R001 Bradycardia, unspecified: Secondary | ICD-10-CM | POA: Diagnosis not present

## 2016-01-24 DIAGNOSIS — Z0000 Encounter for general adult medical examination without abnormal findings: Secondary | ICD-10-CM

## 2016-01-24 MED ORDER — BISOPROLOL-HYDROCHLOROTHIAZIDE 5-6.25 MG PO TABS: 1.0000 | ORAL_TABLET | Freq: Every day | ORAL | 3 refills | Status: DC

## 2016-01-24 NOTE — Patient Instructions (Addendum)
Take the doxazosin at night  Take the bisoprolol 1/2 pill at lunch if BP is above 140/90  If you continue to have dizziness/low blood pressures in the morning it can be due to the cardura, it can drop your blood pressure down If this is the case we will decrease the cardura to 1/2 pill at night and increase the ziac to 10mg  Continue to monitor your BP  Can try boninto peptides vitaminds 500mg  BID for BP  Monitor your blood pressure at home. Go to the ER if any CP, SOB, nausea, dizziness, severe HA, changes vision/speech  Goal BP:  For patients younger than 60: Goal BP < 140/90. For patients 60 and older: Goal BP < 150/90. For patients with diabetes: Goal BP < 140/90. Your most recent BP: BP: (!) 150/90   Take your medications faithfully as instructed. Maintain a healthy weight. Get at least 150 minutes of aerobic exercise per week. Minimize salt intake. Minimize alcohol intake  DASH Eating Plan DASH stands for "Dietary Approaches to Stop Hypertension." The DASH eating plan is a healthy eating plan that has been shown to reduce high blood pressure (hypertension). Additional health benefits may include reducing the risk of type 2 diabetes mellitus, heart disease, and stroke. The DASH eating plan may also help with weight loss. WHAT DO I NEED TO KNOW ABOUT THE DASH EATING PLAN? For the DASH eating plan, you will follow these general guidelines:  Choose foods with a percent daily value for sodium of less than 5% (as listed on the food label).  Use salt-free seasonings or herbs instead of table salt or sea salt.  Check with your health care provider or pharmacist before using salt substitutes.  Eat lower-sodium products, often labeled as "lower sodium" or "no salt added."  Eat fresh foods.  Eat more vegetables, fruits, and low-fat dairy products.  Choose whole grains. Look for the word "whole" as the first word in the ingredient list.  Choose fish and skinless chicken or [place]  more often than red meat. Limit fish, poultry, and meat to 6 oz (170 g) each day.  Limit sweets, desserts, sugars, and sugary drinks.  Choose heart-healthy fats.  Limit cheese to 1 oz (28 g) per day.  Eat more home-cooked food and less restaurant, buffet, and fast food.  Limit fried foods.  Cook foods using methods other than frying.  Limit canned vegetables. If you do use them, rinse them well to decrease the sodium.  When eating at a restaurant, ask that your food be prepared with less salt, or no salt if possible. WHAT FOODS CAN I EAT? Seek help from a dietitian for individual calorie needs. Grains Whole grain or whole wheat bread. Brown rice. Whole grain or whole wheat pasta. Quinoa, bulgur, and whole grain cereals. Low-sodium cereals. Corn or whole wheat flour tortillas. Whole grain cornbread. Whole grain crackers. Low-sodium crackers. Vegetables Fresh or frozen vegetables (raw, steamed, roasted, or grilled). Low-sodium or reduced-sodium tomato and vegetable juices. Low-sodium or reduced-sodium tomato sauce and paste. Low-sodium or reduced-sodium canned vegetables.  Fruits All fresh, canned (in natural juice), or frozen fruits. Meat and Other Protein Products Ground beef (85% or leaner), grass-fed beef, or beef trimmed of fat. Skinless chicken or [place]. Ground chicken or [place]. Pork trimmed of fat. All fish and seafood. Eggs. Dried beans, peas, or lentils. Unsalted nuts and seeds. Unsalted canned beans. Dairy Low-fat dairy products, such as skim or 1% milk, 2% or reduced-fat cheeses, low-fat ricotta or cottage cheese, or  plain low-fat yogurt. Low-sodium or reduced-sodium cheeses. Fats and Oils Tub margarines without trans fats. Light or reduced-fat mayonnaise and salad dressings (reduced sodium). Avocado. Safflower, olive, or canola oils. Natural peanut or almond butter. Other Unsalted popcorn and pretzels. The items listed above may not be a complete list of recommended foods  or beverages. Contact your dietitian for more options. WHAT FOODS ARE NOT RECOMMENDED? Grains White bread. White pasta. White rice. Refined cornbread. Bagels and croissants. Crackers that contain trans fat. Vegetables Creamed or fried vegetables. Vegetables in a cheese sauce. Regular canned vegetables. Regular canned tomato sauce and paste. Regular tomato and vegetable juices. Fruits Dried fruits. Canned fruit in light or heavy syrup. Fruit juice. Meat and Other Protein Products Fatty cuts of meat. Ribs, chicken wings, bacon, sausage, bologna, salami, chitterlings, fatback, hot dogs, bratwurst, and packaged luncheon meats. Salted nuts and seeds. Canned beans with salt. Dairy Whole or 2% milk, cream, half-and-half, and cream cheese. Whole-fat or sweetened yogurt. Full-fat cheeses or blue cheese. Nondairy creamers and whipped toppings. Processed cheese, cheese spreads, or cheese curds. Condiments Onion and garlic salt, seasoned salt, table salt, and sea salt. Canned and packaged gravies. Worcestershire sauce. Tartar sauce. Barbecue sauce. Teriyaki sauce. Soy sauce, including reduced sodium. Steak sauce. Fish sauce. Oyster sauce. Cocktail sauce. Horseradish. Ketchup and mustard. Meat flavorings and tenderizers. Bouillon cubes. Hot sauce. Tabasco sauce. Marinades. Taco seasonings. Relishes. Fats and Oils Butter, stick margarine, lard, shortening, ghee, and bacon fat. Coconut, palm kernel, or palm oils. Regular salad dressings. Other Pickles and olives. Salted popcorn and pretzels. The items listed above may not be a complete list of foods and beverages to avoid. Contact your dietitian for more information. WHERE CAN I FIND MORE INFORMATION? National Heart, Lung, and Blood Institute: travelstabloid.com Document Released: 01/22/2011 Document Revised: 06/19/2013 Document Reviewed: 12/07/2012 [facility] Patient Information 2015 [place], [place]. This information is not  intended to replace advice given to you by your health care provider. Make sure you discuss any questions you have with your health care provider.

## 2016-01-24 NOTE — Progress Notes (Signed)
MEDICARE ANNUAL WELLNESS VISIT AND FOLLOW UP Assessment:    Essential hypertension - continue medications, DASH diet, exercise and monitor at home. Call if greater than 130/80.  Start 1/2 pill in the AM, call if any dizziness or issues -     bisoprolol-hydrochlorothiazide (ZIAC) 5-6.25 MG tablet; Take 1 tablet by mouth daily. Take 1/2 at lunch if BP is over 140  Sinus bradycardia Monitor closely  Prostate cancer [facility]) Follows with urology  Mixed hyperlipidemia -continue medications, check lipids, decrease fatty foods, increase activity.   Prediabetes Discussed general issues about diabetes pathophysiology and management., Educational material distributed., Suggested low cholesterol diet., Encouraged aerobic exercise., Discussed foot care., Reminded to get yearly retinal exam.  Vitamin D deficiency  Elevated prostate specific antigen (PSA) Continue to monitor  Medication management  BMI 25.67,  adult  Encounter for Medicare annual wellness exam     Over 30 minutes of exam, counseling, chart review, and critical decision making was performed  Future Appointments Date Time Provider [place]  03/18/2016 9:30 AM Starlyn Skeans, PA-C [facility] None  06/18/2016 10:30 AM [name], MD [facility] None  01/20/2017 2:00 PM [name], MD [facility] None     Plan:   During the course of the visit the patient was educated and counseled about appropriate screening and preventive services including:    Pneumococcal vaccine   Influenza vaccine  Prevnar 13  Td vaccine  Screening electrocardiogram  Colorectal cancer screening  Diabetes screening  Glaucoma screening  Nutrition counseling    Subjective:  Eugene Friedman is a 82 y.o. [address] male who presents for Medicare Annual Wellness Visit and 3 month follow up for HTN, hyperlipidemia, prediabetes, and vitamin D Def.   His blood pressure has been controlled at home at home are under 120/60's,  today their BP is BP: (!) 150/90  BP Readings from Last 3 Encounters:  01/24/16 (!) 150/90  12/16/15 (!) 158/86  07/12/15 120/76    He does workout, walks 36mins daily. He denies chest pain, shortness of breath, dizziness.  He is on cholesterol medication and denies myalgias. His cholesterol is not at goal. The cholesterol last visit was:   Lab Results  Component Value Date   CHOL 237 (H) 12/16/2015   HDL 67 12/16/2015   LDLCALC 110 12/16/2015   TRIG 299 (H) 12/16/2015   CHOLHDL 3.5 12/16/2015    He has been working on diet and exercise for prediabetes, and denies paresthesia of the feet, polydipsia, polyuria and visual disturbances. Last A1C in the office was:  Lab Results  Component Value Date   HGBA1C 5.1 12/16/2015   Last GFR Lab Results  Component Value Date   GFRNONAA 50 (L) 12/16/2015   Patient is on Vitamin D supplement.   Lab Results  Component Value Date   VD25OH 45 12/16/2015     BMI is Body mass index is 28.04 kg/m., he is working on diet and exercise. Wt Readings from Last 3 Encounters:  01/24/16 214 lb (97.1 kg)  12/16/15 217 lb (98.4 kg)  07/12/15 212 lb 8 oz (96.4 kg)    Medication Review: Current Outpatient Prescriptions on File Prior to Visit  Medication Sig Dispense Refill  . aspirin EC 81 MG tablet Take 81 mg by mouth daily.    . bisoprolol-hydrochlorothiazide (ZIAC) 5-6.25 MG tablet Take 1 tablet by mouth daily. Patient takes if BP is greater than 140.    Marland Kitchen Cholecalciferol (VITAMIN D-3) 5000 UNITS TABS Take by mouth.    Marland Kitchen  doxazosin (CARDURA) 4 MG tablet Take 1 tablet (4 mg total) by mouth daily. 90 tablet 2  . finasteride (PROSCAR) 5 MG tablet Take 1 tablet (5 mg total) by mouth daily. 90 tablet 2  . hyoscyamine (LEVSIN SL) 0.125 MG SL tablet DISSOLVE 1 TO 2 TABLETS UNDER THE TONGUE 3 TO 4 TIMES A DAY AS NEEDED FOR NAUSEA OR VOMITING OR CRAMPING OR DIARRHEA 90 tablet 0  . Krill Oil 1000 MG CAPS Take 1,000 mg by mouth daily.    . LUTEIN PO Take 5  mg by mouth 2 (two) times daily.    Marland Kitchen neomycin-polymyxin-hydrocortisone (CORTISPORIN) 3.5-10000-1 otic suspension Place 4 drops into both ears 4 (four) times daily. 30 mL 1  . tadalafil (CIALIS) 10 MG tablet 1/2-1 pill daily as directed. 60 tablet 3   No current facility-administered medications on file prior to visit.     Allergies: Allergies  Allergen Reactions  . Adhesive [Tape]   . Celebrex [Celecoxib] Other (See Comments)    GI upset  . Lipitor [Atorvastatin]   . Nickel   . Ppd [Tuberculin Purified Protein Derivative] Other (See Comments)    Positive PPD    Current Problems (verified) has Hyperlipidemia; Sinus bradycardia; Vitamin D deficiency; Prediabetes; Elevated prostate specific antigen (PSA); Essential hypertension; Medication management; BMI 25.67,  adult; Prostate cancer ([place]); Screening for rectal cancer; and Encounter for Medicare annual wellness exam on his problem list.  Screening Tests Immunization History  Administered Date(s) Administered  . H1N1 02/17/2007  . Influenza Whole 12/05/2012  . Influenza, High Dose Seasonal PF 11/14/2013, 11/20/2014, 12/16/2015  . Influenza-Unspecified 12/05/2012  . Pneumococcal Conjugate-13 12/05/2014  . Pneumococcal Polysaccharide-23 03/08/2008, 05/18/2013  . Tdap 02/17/2008  . Zoster 02/16/2005   Preventative care: Last colonoscopy: more than 10 years ago, will not get another due to age  Prior vaccinations: TD or Tdap: 2010 Influenza: 2017 Pneumococcal: 2015 Prevnar13: 2016 Shingles/Zostavax: 2007  Names of Other Physician/Practitioners you currently use: 1. [facility] here for primary care 2. [place], eye doctor, last visit 2017 3. Sarajane Jews, dentist, last visit 11/2015 Patient Care Team: [name], MD as PCP - General (Internal Medicine) Calvert Cantor, MD as Consulting Physician (Ophthalmology) Thayer Headings, MD as Consulting Physician (Cardiology) Carolan Clines,  MD as Consulting Physician (Urology) Laurence Spates, MD as Consulting Physician (Gastroenterology) Armandina Gemma, MD as Consulting Physician (General Surgery)  Surgical: He  has a past surgical history that includes cervical neck mass  (07/08/06); Appendectomy; Mastoidectomy (Left); Flexible sigmoidoscopy; Knee surgery; and Hemorrhoid surgery. Family His family history includes Cancer in his maternal grandmother; Diabetes in his maternal aunt, mother, and paternal aunt; Heart attack (age of onset: 48) in his father; Hypertension in his father; Stroke in his mother. Social history  He reports that he has never smoked. He has never used smokeless tobacco. He reports that he drinks about 6.0 oz of alcohol per week . He reports that he does not use drugs.  MEDICARE WELLNESS OBJECTIVES: Physical activity: Current Exercise Habits: Home exercise routine, Type of exercise: treadmill, Time (Minutes): 30, Frequency (Times/Week): 7, Weekly Exercise (Minutes/Week): 210, Intensity: Mild Cardiac risk factors: Cardiac Risk Factors include: advanced age (>84men, >73 women);dyslipidemia;hypertension;male gender;sedentary lifestyle Depression/mood screen:   Depression screen [facility] 2/9 01/24/2016  Decreased Interest 0  Down, Depressed, Hopeless 0  PHQ - 2 Score 0    ADLs:  In your present state of health, do you have any difficulty performing the following activities: 01/24/2016 12/16/2015  Hearing? N  N  Vision? N N  Difficulty concentrating or making decisions? N N  Walking or climbing stairs? N N  Dressing or bathing? N N  Doing errands, shopping? N N  Some recent data might be hidden     Cognitive Testing  Alert? Yes  Normal Appearance?Yes  Oriented to person? Yes  Place? Yes   Time? Yes  Recall of three objects?  Yes  Can perform simple calculations? Yes  Displays appropriate judgment?Yes  Can read the correct time from a watch face?Yes  EOL planning: Does Patient Have a Medical Advance Directive?:  Yes Type of Advance Directive: Living will, Healthcare Power of [place] in Chart?: No - copy requested   Objective:   Today's Vitals   01/24/16 1135  BP: (!) 150/90  Pulse: 84  Resp: 16  Temp: 97.3 F (36.3 C)  SpO2: 99%  Weight: 214 lb (97.1 kg)  Height: 6' 1.25" (1.861 m)  PainSc: 0-No pain   Body mass index is 28.04 kg/m.  General appearance: alert, no distress, WD/WN, male HEENT: normocephalic, sclerae anicteric, TMs pearly, nares patent, no discharge or erythema, pharynx normal Oral cavity: MMM, no lesions Neck: supple, no lymphadenopathy, no thyromegaly, no masses Heart: RRR, normal S1, S2, no murmurs Lungs: CTA bilaterally, no wheezes, rhonchi, or rales Abdomen: +bs, soft, non tender, non distended, no masses, no hepatomegaly, no splenomegaly Musculoskeletal: nontender, no swelling, no obvious deformity Extremities: no edema, no cyanosis, no clubbing Pulses: 2+ symmetric, upper and lower extremities, normal cap refill Neurological: alert, oriented x 3, CN2-12 intact, strength normal upper extremities and lower extremities, sensation normal throughout, DTRs 2+ throughout, no cerebellar signs, gait normal Psychiatric: normal affect, behavior normal, pleasant   Medicare Attestation I have personally reviewed: The patient's medical and social history Their use of alcohol, tobacco or illicit drugs Their current medications and supplements The patient's functional ability including ADLs,fall risks, home safety risks, cognitive, and hearing and visual impairment Diet and physical activities Evidence for depression or mood disorders  The patient's weight, height, BMI, and visual acuity have been recorded in the chart.  I have made referrals, counseling, and provided education to the patient based on review of the above and I have provided the patient with a written personalized care plan for preventive services.     Vicie Mutters,  PA-C   01/24/2016

## 2016-03-18 ENCOUNTER — Other Ambulatory Visit: Payer: Self-pay | Admitting: Internal Medicine

## 2016-03-18 ENCOUNTER — Other Ambulatory Visit: Payer: Self-pay

## 2016-03-18 ENCOUNTER — Encounter: Payer: Self-pay | Admitting: Internal Medicine

## 2016-03-18 ENCOUNTER — Ambulatory Visit (INDEPENDENT_AMBULATORY_CARE_PROVIDER_SITE_OTHER): Payer: Medicare Other | Admitting: Internal Medicine

## 2016-03-18 VITALS — BP 130/60 | HR 60 | Temp 97.5°F | Resp 16 | Ht 73.25 in | Wt 218.2 lb

## 2016-03-18 DIAGNOSIS — R7303 Prediabetes: Secondary | ICD-10-CM | POA: Diagnosis not present

## 2016-03-18 DIAGNOSIS — E559 Vitamin D deficiency, unspecified: Secondary | ICD-10-CM

## 2016-03-18 DIAGNOSIS — E6609 Other obesity due to excess calories: Secondary | ICD-10-CM

## 2016-03-18 DIAGNOSIS — E782 Mixed hyperlipidemia: Secondary | ICD-10-CM

## 2016-03-18 DIAGNOSIS — I10 Essential (primary) hypertension: Secondary | ICD-10-CM

## 2016-03-18 DIAGNOSIS — Z974 Presence of external hearing-aid: Secondary | ICD-10-CM | POA: Diagnosis not present

## 2016-03-18 MED ORDER — TADALAFIL 10 MG PO TABS: ORAL_TABLET | ORAL | 3 refills | Status: DC

## 2016-03-18 MED ORDER — NEOMYCIN-POLYMYXIN-HC 3.5-10000-1 OT SUSP: 4.0000 [drp] | Freq: Four times a day (QID) | OTIC | 1 refills | Status: DC

## 2016-03-18 MED ORDER — BISOPROLOL-HYDROCHLOROTHIAZIDE 5-6.25 MG PO TABS: 1.0000 | ORAL_TABLET | Freq: Every day | ORAL | 3 refills | Status: DC

## 2016-03-18 NOTE — Progress Notes (Signed)
Assessment and Plan:  Hypertension:  -Continue medication,  -monitor blood pressure at home.  -Continue DASH diet.   -Reminder to go to the ER if any CP, SOB, nausea, dizziness, severe HA, changes vision/speech, left arm numbness and tingling, and jaw pain.  Cholesterol: -Continue diet and exercise.   Pre-diabetes: -Continue diet and exercise.   Vitamin D Def: -continue medications.   Obesity -cont diet and exercise -recommended decreasing animal meats and increasing veggies and fruits -avoid dairy products and starches in heavy amounts  Hearing aids -seem to be helping -given expense of new hearing aid patient requests avoiding labs today.   Continue diet and meds as discussed. Further disposition pending results of labs.  HPI 83 y.o. male  presents for 3 month follow up with hypertension, hyperlipidemia, prediabetes and vitamin D.   His blood pressure has been controlled at home, today their BP is BP: 130/60.   He does workout. He denies chest pain, shortness of breath, dizziness.  He reports that he has been doing a full tablet of his ziac.  He reports that he is taking his blood pressure medication between 1-3 pm.  He did have an outlying reports of a few low pressure.     He is not on cholesterol medication and denies myalgias. His cholesterol is not at goal. The cholesterol last visit was:   Lab Results  Component Value Date   CHOL 237 (H) 12/16/2015   HDL 67 12/16/2015   LDLCALC 110 12/16/2015   TRIG 299 (H) 12/16/2015   CHOLHDL 3.5 12/16/2015     He has been working on diet and exercise for prediabetes, and denies foot ulcerations, hyperglycemia, hypoglycemia , increased appetite, nausea, paresthesia of the feet, polydipsia, polyuria, visual disturbances, vomiting and weight loss. Last A1C in the office was:  Lab Results  Component Value Date   HGBA1C 5.1 12/16/2015    Patient is on Vitamin D supplement.  Lab Results  Component Value Date   VD25OH 45  12/16/2015      He is currently seeing an audiologist to get his hearing aids.  He reports that they have gotten the hearing aids but they are fine tuning them.  They do help.  He was impressed with how expensive they were.    He has no other complaints.  He reports that his diet has not been good over the last 3 months but he is about to go back to his fish and vegetable based diet.     Current Medications:  Current Outpatient Prescriptions on File Prior to Visit  Medication Sig Dispense Refill  . aspirin EC 81 MG tablet Take 81 mg by mouth daily.    . Cholecalciferol (VITAMIN D-3) 5000 UNITS TABS Take by mouth.    . doxazosin (CARDURA) 4 MG tablet Take 1 tablet (4 mg total) by mouth daily. 90 tablet 2  . finasteride (PROSCAR) 5 MG tablet Take 1 tablet (5 mg total) by mouth daily. 90 tablet 2  . hyoscyamine (LEVSIN SL) 0.125 MG SL tablet DISSOLVE 1 TO 2 TABLETS UNDER THE TONGUE 3 TO 4 TIMES A DAY AS NEEDED FOR NAUSEA OR VOMITING OR CRAMPING OR DIARRHEA 90 tablet 0  . Krill Oil 1000 MG CAPS Take 1,000 mg by mouth daily.    . LUTEIN PO Take 5 mg by mouth 2 (two) times daily.    . tadalafil (CIALIS) 10 MG tablet 1/2-1 pill daily as directed. 60 tablet 3   No current facility-administered medications on file  prior to visit.     Medical History:  Past Medical History:  Diagnosis Date  . Allergic rhinitis   . Cancer [facility])    prostate  . DJD (degenerative joint disease)   . Hypertension    Borderline   . Sinus bradycardia   . Vitamin D deficiency     Allergies:  Allergies  Allergen Reactions  . Adhesive [Tape]   . Celebrex [Celecoxib] Other (See Comments)    GI upset  . Lipitor [Atorvastatin]   . Nickel   . Ppd [Tuberculin Purified Protein Derivative] Other (See Comments)    Positive PPD     Review of Systems:  Review of Systems  Constitutional: Negative for chills, fever and malaise/fatigue.  HENT: Negative for congestion, ear pain and sore throat.   Eyes: Negative.    Respiratory: Negative for cough, shortness of breath and wheezing.   Cardiovascular: Negative for chest pain, palpitations and leg swelling.  Gastrointestinal: Negative for abdominal pain, blood in stool, constipation, diarrhea, heartburn and melena.  Genitourinary: Negative.   Skin: Negative.   Neurological: Negative for dizziness, sensory change, loss of consciousness and headaches.  Psychiatric/Behavioral: Negative for depression. The patient is not nervous/anxious and does not have insomnia.     Family history- Review and unchanged  Social history- Review and unchanged  Physical Exam: BP 130/60   Pulse 60   Temp 97.5 F (36.4 C)   Resp 16   Ht 6' 1.25" (1.861 m)   Wt 218 lb 3.2 oz (99 kg)   SpO2 98%   BMI 28.59 kg/m  Wt Readings from Last 3 Encounters:  03/18/16 218 lb 3.2 oz (99 kg)  01/24/16 214 lb (97.1 kg)  12/16/15 217 lb (98.4 kg)    General Appearance: Well nourished well developed, in no apparent distress. Eyes: PERRLA, EOMs, conjunctiva no swelling or erythema ENT/Mouth: Ear canals normal without obstruction, swelling, erythma, discharge.  TMs normal bilaterally.  Oropharynx moist, clear, without exudate, or postoropharyngeal swelling. Neck: Supple, thyroid normal,no cervical adenopathy  Respiratory: Respiratory effort normal, Breath sounds clear A&P without rhonchi, wheeze, or rale.  No retractions, no accessory usage. Cardio: RRR with no MRGs. Brisk peripheral pulses without edema.  Abdomen: Soft, + BS,  Non tender, no guarding, rebound, hernias, masses. Musculoskeletal: Full ROM, 5/5 strength, Normal gait Skin: Warm, dry without rashes, lesions, ecchymosis.  Neuro: Awake and oriented X 3, Cranial nerves intact. Normal muscle tone, no cerebellar symptoms. Psych: Normal affect, Insight and Judgment appropriate.    Starlyn Skeans, PA-C 9:47 AM [facility]

## 2016-04-07 ENCOUNTER — Other Ambulatory Visit: Payer: Self-pay | Admitting: Internal Medicine

## 2016-04-07 ENCOUNTER — Other Ambulatory Visit: Payer: Self-pay | Admitting: *Deleted

## 2016-04-07 MED ORDER — HYOSCYAMINE SULFATE 0.125 MG SL SUBL: SUBLINGUAL_TABLET | SUBLINGUAL | 1 refills | Status: DC

## 2016-04-28 DIAGNOSIS — H532 Diplopia: Secondary | ICD-10-CM | POA: Diagnosis not present

## 2016-04-28 DIAGNOSIS — H524 Presbyopia: Secondary | ICD-10-CM | POA: Diagnosis not present

## 2016-04-28 DIAGNOSIS — H40013 Open angle with borderline findings, low risk, bilateral: Secondary | ICD-10-CM | POA: Diagnosis not present

## 2016-04-28 DIAGNOSIS — H353122 Nonexudative age-related macular degeneration, left eye, intermediate dry stage: Secondary | ICD-10-CM | POA: Diagnosis not present

## 2016-06-18 ENCOUNTER — Ambulatory Visit: Payer: Self-pay | Admitting: Internal Medicine

## 2016-06-23 DIAGNOSIS — C61 Malignant neoplasm of prostate: Secondary | ICD-10-CM | POA: Diagnosis not present

## 2016-06-23 DIAGNOSIS — L218 Other seborrheic dermatitis: Secondary | ICD-10-CM | POA: Diagnosis not present

## 2016-06-23 DIAGNOSIS — L820 Inflamed seborrheic keratosis: Secondary | ICD-10-CM | POA: Diagnosis not present

## 2016-06-23 DIAGNOSIS — L853 Xerosis cutis: Secondary | ICD-10-CM | POA: Diagnosis not present

## 2016-06-23 DIAGNOSIS — L578 Other skin changes due to chronic exposure to nonionizing radiation: Secondary | ICD-10-CM | POA: Diagnosis not present

## 2016-06-23 DIAGNOSIS — D1801 Hemangioma of skin and subcutaneous tissue: Secondary | ICD-10-CM | POA: Diagnosis not present

## 2016-06-23 DIAGNOSIS — L821 Other seborrheic keratosis: Secondary | ICD-10-CM | POA: Diagnosis not present

## 2016-06-25 ENCOUNTER — Ambulatory Visit (INDEPENDENT_AMBULATORY_CARE_PROVIDER_SITE_OTHER): Payer: Medicare Other | Admitting: Internal Medicine

## 2016-06-25 VITALS — BP 124/74 | HR 60 | Temp 97.5°F | Resp 16 | Ht 73.25 in | Wt 212.0 lb

## 2016-06-25 DIAGNOSIS — E782 Mixed hyperlipidemia: Secondary | ICD-10-CM

## 2016-06-25 DIAGNOSIS — R7303 Prediabetes: Secondary | ICD-10-CM | POA: Diagnosis not present

## 2016-06-25 DIAGNOSIS — Z79899 Other long term (current) drug therapy: Secondary | ICD-10-CM | POA: Diagnosis not present

## 2016-06-25 DIAGNOSIS — K589 Irritable bowel syndrome without diarrhea: Secondary | ICD-10-CM

## 2016-06-25 DIAGNOSIS — I10 Essential (primary) hypertension: Secondary | ICD-10-CM

## 2016-06-25 DIAGNOSIS — E559 Vitamin D deficiency, unspecified: Secondary | ICD-10-CM | POA: Diagnosis not present

## 2016-06-25 LAB — CBC WITH DIFFERENTIAL/PLATELET
BASOS PCT: 0 %
Basophils Absolute: 0 cells/uL (ref 0–200)
EOS ABS: 171 {cells}/uL (ref 15–500)
EOS PCT: 3 %
HCT: 40.6 % (ref 38.5–50.0)
Hemoglobin: 13.6 g/dL (ref 13.2–17.1)
LYMPHS PCT: 22 %
Lymphs Abs: 1254 cells/uL (ref 850–3900)
MCH: 31.1 pg (ref 27.0–33.0)
MCHC: 33.5 g/dL (ref 32.0–36.0)
MCV: 92.9 fL (ref 80.0–100.0)
MPV: 9.6 fL (ref 7.5–12.5)
Monocytes Absolute: 684 cells/uL (ref 200–950)
Monocytes Relative: 12 %
NEUTROS ABS: 3591 {cells}/uL (ref 1500–7800)
Neutrophils Relative %: 63 %
Platelets: 209 10*3/uL (ref 140–400)
RBC: 4.37 MIL/uL (ref 4.20–5.80)
RDW: 14.4 % (ref 11.0–15.0)
WBC: 5.7 10*3/uL (ref 3.8–10.8)

## 2016-06-25 LAB — BASIC METABOLIC PANEL WITH GFR
BUN: 27 mg/dL — AB (ref 7–25)
CO2: 25 mmol/L (ref 20–31)
CREATININE: 1.55 mg/dL — AB (ref 0.70–1.11)
Calcium: 9.5 mg/dL (ref 8.6–10.3)
Chloride: 104 mmol/L (ref 98–110)
GFR, EST AFRICAN AMERICAN: 46 mL/min — AB (ref >=60)
GFR, Est Non African American: 40 mL/min — ABNORMAL LOW (ref >=60)
Glucose, Bld: 92 mg/dL (ref 65–99)
POTASSIUM: 4.9 mmol/L (ref 3.5–5.3)
SODIUM: 138 mmol/L (ref 135–146)

## 2016-06-25 LAB — LIPID PANEL
CHOL/HDL RATIO: 3.1 ratio (ref <5.0)
Cholesterol: 214 mg/dL — ABNORMAL HIGH (ref <200)
HDL: 68 mg/dL (ref >40)
LDL Cholesterol: 111 mg/dL — ABNORMAL HIGH (ref <100)
Triglycerides: 177 mg/dL — ABNORMAL HIGH (ref <150)
VLDL: 35 mg/dL — AB (ref <30)

## 2016-06-25 LAB — TSH: TSH: 3.08 m[IU]/L (ref 0.40–4.50)

## 2016-06-25 LAB — HEPATIC FUNCTION PANEL
ALT: 17 U/L (ref 9–46)
AST: 18 U/L (ref 10–35)
Albumin: 4.3 g/dL (ref 3.6–5.1)
Alkaline Phosphatase: 50 U/L (ref 40–115)
BILIRUBIN DIRECT: 0.1 mg/dL (ref <=0.2)
BILIRUBIN INDIRECT: 0.5 mg/dL (ref 0.2–1.2)
Total Bilirubin: 0.6 mg/dL (ref 0.2–1.2)
Total Protein: 6.7 g/dL (ref 6.1–8.1)

## 2016-06-25 MED ORDER — DOXAZOSIN MESYLATE 8 MG PO TABS: ORAL_TABLET | ORAL | 1 refills | Status: DC

## 2016-06-25 MED ORDER — LOSARTAN POTASSIUM 50 MG PO TABS: ORAL_TABLET | ORAL | 1 refills | Status: DC

## 2016-06-25 MED ORDER — BISOPROLOL-HYDROCHLOROTHIAZIDE 5-6.25 MG PO TABS: ORAL_TABLET | ORAL | 1 refills | Status: DC

## 2016-06-25 MED ORDER — HYOSCYAMINE SULFATE 0.125 MG SL SUBL: SUBLINGUAL_TABLET | SUBLINGUAL | 1 refills | Status: DC

## 2016-06-25 NOTE — Patient Instructions (Signed)

## 2016-06-25 NOTE — Progress Notes (Signed)
This very nice 83 y.o. [employer] [place] M presents for 6 month follow up with Hypertension, Hyperlipidemia, Pre-Diabetes and Vitamin D Deficiency. Patient has been followed closely by Dr Gaynelle Arabian since 2007 for Gleason 6 Prostate Ca.      Patient is treated for HTN & BP has been controlled at home. Today's BP is elevated at 152/88 and rechecked at 124/74.  Patient has had no complaints of any cardiac type chest pain, palpitations, dyspnea/orthopnea/PND, dizziness, claudication, or dependent edema.     Hyperlipidemia is controlled with diet & meds. Patient denies myalgias or other med SE's. Last Lipids were not at goal: Lab Results  Component Value Date   CHOL 237 (H) 12/16/2015   HDL 67 12/16/2015   LDLCALC 110 12/16/2015   TRIG 299 (H) 12/16/2015   CHOLHDL 3.5 12/16/2015      Also, the patient has history of PreDiabetes (A1c 6.1% in 2014) and has had no symptoms of reactive hypoglycemia, diabetic polys, paresthesias or visual blurring.  Last A1c was at goal: Lab Results  Component Value Date   HGBA1C 5.1 12/16/2015      Further, the patient also has history of Vitamin D Deficiency ("42" on treatment in 2009) and supplements vitamin D without any suspected side-effects. Last vitamin D was still low:  Lab Results  Component Value Date   VD25OH 45 12/16/2015   Current Outpatient Prescriptions on File Prior to Visit  Medication Sig  . aspirin EC 81 MG tablet Take 81 mg by mouth daily.  . Cholecalciferol (VITAMIN D-3) 5000 UNITS TABS Take by mouth.  . finasteride (PROSCAR) 5 MG tablet Take 1 tablet (5 mg total) by mouth daily.  Javier Docker Oil 1000 MG CAPS Take 1,000 mg by mouth daily.  . LUTEIN PO Take 5 mg by mouth 2 (two) times daily.  Marland Kitchen neomycin-polymyxin-hydrocortisone (CORTISPORIN) 3.5-10000-1 otic suspension Place 4 drops into both ears 4 (four) times daily.  . tadalafil (CIALIS) 10 MG tablet 1/2-1 pill daily as directed.   Allergies  Allergen Reactions  . Adhesive [Tape]   .  Celebrex [Celecoxib] Other (See Comments)    GI upset  . Lipitor [Atorvastatin]   . Nickel   . Ppd [Tuberculin Purified Protein Derivative] Other (See Comments)    Positive PPD   PMHx:   Past Medical History:  Diagnosis Date  . Allergic rhinitis   . Cancer [facility])    prostate  . DJD (degenerative joint disease)   . Hypertension    Borderline   . Sinus bradycardia   . Vitamin D deficiency    Immunization History  Administered Date(s) Administered  . H1N1 02/17/2007  . Influenza Whole 12/05/2012  . Influenza, High Dose Seasonal PF 11/14/2013, 11/20/2014, 12/16/2015  . Influenza-Unspecified 12/05/2012  . Pneumococcal Conjugate-13 12/05/2014  . Pneumococcal Polysaccharide-23 03/08/2008, 05/18/2013  . Tdap 02/17/2008  . Zoster 02/16/2005   Past Surgical History:  Procedure Laterality Date  . APPENDECTOMY    . cervical neck mass   07/08/06   Left Excision left posterior cervical neck mass (2.5 cm). SURGEON:  Earnstine Regal, M.D., Allegra Grana 07/08/06 and 12/05/07  . FLEXIBLE SIGMOIDOSCOPY    . HEMORRHOID SURGERY    . KNEE SURGERY    . MASTOIDECTOMY Left    FHx:    Reviewed / unchanged  SHx:    Reviewed / unchanged  Systems Review:  Constitutional: Denies fever, chills, wt changes, headaches, insomnia, fatigue, night sweats, change in appetite. Eyes: Denies redness, blurred vision, diplopia,  discharge, itchy, watery eyes.  ENT: Denies discharge, congestion, post nasal drip, epistaxis, sore throat, earache, hearing loss, dental pain, tinnitus, vertigo, sinus pain, snoring.  CV: Denies chest pain, palpitations, irregular heartbeat, syncope, dyspnea, diaphoresis, orthopnea, PND, claudication or edema. Respiratory: denies cough, dyspnea, DOE, pleurisy, hoarseness, laryngitis, wheezing.  Gastrointestinal: Denies dysphagia, odynophagia, heartburn, reflux, water brash, abdominal pain or cramps, nausea, vomiting, bloating, diarrhea, constipation, hematemesis, melena, hematochezia  or  hemorrhoids. Genitourinary: Denies dysuria, frequency, urgency, nocturia, hesitancy, discharge, hematuria or flank pain. Musculoskeletal: Denies arthralgias, myalgias, stiffness, jt. swelling, pain, limping or strain/sprain.  Skin: Denies pruritus, rash, hives, warts, acne, eczema or change in skin lesion(s). Neuro: No weakness, tremor, incoordination, spasms, paresthesia or pain. Psychiatric: Denies confusion, memory loss or sensory loss. Endo: Denies change in weight, skin or hair change.  Heme/Lymph: No excessive bleeding, bruising or enlarged lymph nodes.  Physical Exam  BP  152/88 -> 124/74  P 60   T 97.5 F    R 16   Ht 6' 1.25"    Wt 212 lb    BMI 27.78    Appears well nourished, well groomed  and in no distress.  Eyes: PERRLA, EOMs, conjunctiva no swelling or erythema. Sinuses: No frontal/maxillary tenderness ENT/Mouth: EAC's clear, TM's nl w/o erythema, bulging. Nares clear w/o erythema, swelling, exudates. Oropharynx clear without erythema or exudates. Oral hygiene is good. Tongue normal, non obstructing. Hearing intact.  Neck: Supple. Thyroid nl. Car 2+/2+ without bruits, nodes or JVD. Chest: Respirations nl with BS clear & equal w/o rales, rhonchi, wheezing or stridor.  Cor: Heart sounds normal w/ regular rate and rhythm without sig. murmurs, gallops, clicks or rubs. Peripheral pulses normal and equal  without edema.  Abdomen: Soft & bowel sounds normal. Non-tender w/o guarding, rebound, hernias, masses or organomegaly.  Lymphatics: Unremarkable.  Musculoskeletal: Full ROM all peripheral extremities, joint stability, 5/5 strength and normal gait.  Skin: Warm, dry without exposed rashes, lesions or ecchymosis apparent.  Neuro: Cranial nerves intact, reflexes equal bilaterally. Sensory-motor testing grossly intact. Tendon reflexes grossly intact.  Pysch: Alert & oriented x 3.  Insight and judgement nl & appropriate. No ideations.  Assessment and Plan:  1. Essential  hypertension  - Continue medication, monitor blood pressure at home.  - Continue DASH diet. Reminder to go to the ER if any CP,  SOB, nausea, dizziness, severe HA, changes vision/speech,  left arm numbness and tingling and jaw pain.  - CBC with Differential/Platelet - BASIC METABOLIC PANEL WITH GFR - Magnesium - TSH  -  ADD Losartan (COZAAR) 50 MG tablet; Take 1 tablet at bedtime for BP  Dispense: 180 tablet; Refill: 1  2. Hyperlipidemia, mixed  - Continue diet/meds, exercise,& lifestyle modifications.  - Continue monitor periodic cholesterol/liver & renal functions   - Hepatic function panel - Lipid panel - TSH  3. Prediabetes  - Hemoglobin A1c - Insulin, random  4. Vitamin D deficiency  - Continue diet, exercise, lifestyle modifications.  - Monitor appropriate labs. - Continue supplementation. - VITAMIN D 25 Hydroxy   5. Medication management  - CBC with Differential/Platelet - BASIC METABOLIC PANEL WITH GFR - Hepatic function panel - Magnesium - Lipid panel - TSH - Hemoglobin A1c - Insulin, random - VITAMIN D 25 Hydroxy   6. Irritable bowel syndrome  - hyoscyamine (LEVSIN SL) 0.125 MG SL tablet; Take 1 to 2 tablets 4 x / day if needed for abdominal cramping, nausea or Diarrhea  Dispense: 120 tablet; Refill: 1  Discussed  regular exercise, BP monitoring, weight control to achieve/maintain BMI less than 25 and discussed med and SE's. Recommended labs to assess and monitor clinical status with further disposition pending results of labs. Over 30 minutes of exam, counseling, chart review was performed.

## 2016-06-26 LAB — VITAMIN D 25 HYDROXY (VIT D DEFICIENCY, FRACTURES): Vit D, 25-Hydroxy: 61 ng/mL (ref 30–100)

## 2016-06-26 LAB — INSULIN, RANDOM: Insulin: 8.4 u[IU]/mL (ref 2.0–19.6)

## 2016-06-26 LAB — HEMOGLOBIN A1C
HEMOGLOBIN A1C: 5.1 % (ref <5.7)
Mean Plasma Glucose: 100 mg/dL

## 2016-06-26 LAB — MAGNESIUM: Magnesium: 2.1 mg/dL (ref 1.5–2.5)

## 2016-06-27 ENCOUNTER — Encounter: Payer: Self-pay | Admitting: Internal Medicine

## 2016-07-07 DIAGNOSIS — N5201 Erectile dysfunction due to arterial insufficiency: Secondary | ICD-10-CM | POA: Diagnosis not present

## 2016-07-07 DIAGNOSIS — C61 Malignant neoplasm of prostate: Secondary | ICD-10-CM | POA: Diagnosis not present

## 2016-07-07 DIAGNOSIS — R972 Elevated prostate specific antigen [PSA]: Secondary | ICD-10-CM | POA: Diagnosis not present

## 2016-07-09 ENCOUNTER — Ambulatory Visit (INDEPENDENT_AMBULATORY_CARE_PROVIDER_SITE_OTHER): Payer: Medicare Other | Admitting: Internal Medicine

## 2016-07-09 ENCOUNTER — Encounter: Payer: Self-pay | Admitting: Internal Medicine

## 2016-07-09 VITALS — BP 116/64 | HR 52 | Temp 97.9°F | Resp 16 | Ht 73.25 in | Wt 212.4 lb

## 2016-07-09 DIAGNOSIS — J40 Bronchitis, not specified as acute or chronic: Secondary | ICD-10-CM | POA: Diagnosis not present

## 2016-07-09 DIAGNOSIS — K5792 Diverticulitis of intestine, part unspecified, without perforation or abscess without bleeding: Secondary | ICD-10-CM | POA: Diagnosis not present

## 2016-07-09 DIAGNOSIS — I10 Essential (primary) hypertension: Secondary | ICD-10-CM | POA: Diagnosis not present

## 2016-07-09 MED ORDER — AZITHROMYCIN 250 MG PO TABS: ORAL_TABLET | ORAL | 1 refills | Status: DC

## 2016-07-09 MED ORDER — METRONIDAZOLE 500 MG PO TABS: ORAL_TABLET | ORAL | 0 refills | Status: AC

## 2016-07-09 MED ORDER — CIPROFLOXACIN HCL 500 MG PO TABS: ORAL_TABLET | ORAL | 0 refills | Status: AC

## 2016-07-09 NOTE — Progress Notes (Signed)
Subjective:    Patient ID: Eugene Friedman, male    DOB: 30-May-1928, 83 y.o.   MRN: [id number]  HPI    This very nice 83 yo WWM ([place]) with HTN returns for 2 week f/u after adding Losartan hs complimenting his Am Ziac and he reports random BP's have been Normal. He does report sometimes in the AM, his  sys BP's are about 100. He denies any sx's as HA's dizziness, CP, palpitations, dyspnea or edema.       He is leaving in 2 weeks to go to [place] for 2-3 months   and requests rx's for Z k and Cipro /Flagyl to have on hand for respiratory illness or flare of Diverticulitis.   Medication Sig  . aspirin EC 81 MG tablet Take 81 mg by mouth daily.  . bisoprolol-hydrochlorothiazide (ZIAC) 5-6.25 MG tablet Take 1 tablet each morning for BP  . Cholecalciferol (VITAMIN D-3) 5000 UNITS TABS Take by mouth.  . doxazosin (CARDURA) 8 MG tablet Take 1/2 to 1 tablet at Bedtime for Prostate & BP  . finasteride (PROSCAR) 5 MG tablet Take 1 tablet (5 mg total) by mouth daily.  . hyoscyamine (LEVSIN SL) 0.125 MG SL tablet Take 1 to 2 tablets 4 x / day if needed for abdominal cramping, nausea or Diarrhea  . Krill Oil 1000 MG CAPS Take 1,000 mg by mouth daily.  Marland Kitchen losartan (COZAAR) 50 MG tablet Take 1 tablet at bedtime for BP  . LUTEIN PO Take 5 mg by mouth 2 (two) times daily.  Marland Kitchen neomycin-polymyxin-hydrocortisone (CORTISPORIN) 3.5-10000-1 otic suspension Place 4 drops into both ears 4 (four) times daily.  . tadalafil (CIALIS) 10 MG tablet 1/2-1 pill daily as directed.   No facility-administered medications prior to visit.    Allergies  Allergen Reactions  . Adhesive [Tape]   . Celebrex [Celecoxib] Other (See Comments)    GI upset  . Lipitor [Atorvastatin]   . Nickel   . Ppd [Tuberculin Purified Protein Derivative] Other (See Comments)    Positive PPD   Past Medical History:  Diagnosis Date  . Allergic rhinitis   . Cancer [facility])    prostate  . DJD (degenerative joint disease)   . Hypertension    Borderline   . Sinus bradycardia   . Vitamin D deficiency    Review of Systems  10 point systems review negative except as above.    Objective:   Physical Exam   BP 116/64   Pulse (!) 52   Temp 97.9 F (36.6 C)   Resp 16   Ht 6' 1.25" (1.861 m)   Wt 212 lb 6.4 oz (96.3 kg)   BMI 27.83 kg/m   HEENT - WNL. Neck - supple.  Chest - Clear equal BS. Cor - Nl HS. RRR w/o sig MGR. PP 1(+). No edema. MS- FROM w/o deformities.  Gait Nl. Neuro -  Nl w/o focal abnormalities.    Assessment & Plan:   1. Essential hypertension   2. Bronchitis  - azithromycin (ZITHROMAX) 250 MG tablet; Take 2 tablets (500 mg) on  Day 1,  followed by 1 tablet (250 mg) once daily on Days 2 through 5 for Respiratory Infection  Dispense: 6 each; Refill: 1  3. Diverticulitis  - metroNIDAZOLE (FLAGYL) 500 MG tablet; Take 1 tablet 3 x / day for Diverticulitis  Dispense: 42 tablet;   - ciprofloxacin (CIPRO) 500 MG tablet; Take 1 tablet 2 x / day for Diverticulituis  Dispense: 28 tablet;

## 2016-07-09 NOTE — Patient Instructions (Signed)

## 2016-10-22 ENCOUNTER — Ambulatory Visit (INDEPENDENT_AMBULATORY_CARE_PROVIDER_SITE_OTHER): Payer: Medicare Other | Admitting: Internal Medicine

## 2016-10-22 ENCOUNTER — Encounter: Payer: Self-pay | Admitting: Internal Medicine

## 2016-10-22 VITALS — BP 160/80 | HR 54 | Temp 97.7°F | Ht 73.5 in | Wt 211.0 lb

## 2016-10-22 DIAGNOSIS — N419 Inflammatory disease of prostate, unspecified: Secondary | ICD-10-CM

## 2016-10-22 DIAGNOSIS — I10 Essential (primary) hypertension: Secondary | ICD-10-CM

## 2016-10-22 NOTE — Patient Instructions (Signed)
Restart Losartan at [facility]

## 2016-10-22 NOTE — Progress Notes (Signed)
  Subjective:    Patient ID: Eugene Friedman, male    DOB: Feb 27, 1928, 83 y.o.   MRN: [id number]  HPI    This nice 83  Yo WWM having returned to [place] last nite at [employer] after a 2 month sojourn with his girlfriend in [place]. He has been followed with hx/o HTN, HLD, PreDM and Indolent Prostate Ca presents with c/o recent elevated BP's having been off of his Losartan for several weeks and also has c/o of recently noting blood in ejaculate x 2 while visiting his [place] girlfriend.  He denies any HT sx's as HA's, dizziness, CP, palpitations , dyspnea or edema. Likewise he's had no LUTS or dysuria.   Medication Sig  . aspirin EC 81 MG  Take  daily.  . bisoprolol-hctz  5-6.25  Take 1 tablet each morning for BP  . VIT D 5000 UNITS  Take by mouth.  . doxazosin  8 MG tablet Take 1/2 to 1 tab at Bedtime for Prostate & BP  . finasteride 5 MG tablet Take 1 tab daily.  Marland Kitchen Hyoscyamine 0.125 MG SL  Take 1-2 tab 4 x /day if needed   . Krill Oil 1000 MG Take daily.  . LUTEIN  5 mg Take  2 x daily.  . CORTISPORIN otic susp Place 4 drops into both ears 4 (four) times daily.  . tadalafil (CIALIS) 10 MG 1/2-1 pill daily as directed.  Marland Kitchen losartan  50 MG tablet Take 1 tab at bedtime (not taking: Reported on 10/22/2016)   Allergies  Allergen Reactions  . Adhesive [Tape]   . Celebrex [Celecoxib] Other (See Comments)    GI upset  . Lipitor [Atorvastatin]   . Nickel   . Ppd [Tuberculin Purified Protein Derivative] Other (See Comments)    Positive PPD   Past Medical History:  Diagnosis Date  . Allergic rhinitis   . Cancer [facility])    prostate  . DJD (degenerative joint disease)   . Hypertension    Borderline   . Sinus bradycardia   . Vitamin D deficiency    Past Surgical History:  Procedure Laterality Date  . APPENDECTOMY    . cervical neck mass   07/08/06   Left Excision left posterior cervical neck mass (2.5 cm). SURGEON:  Earnstine Regal, M.D., Allegra Grana 07/08/06 and 12/05/07  . FLEXIBLE SIGMOIDOSCOPY    .  HEMORRHOID SURGERY    . KNEE SURGERY    . MASTOIDECTOMY Left    Review of Systems  10 point systems review negative except as above.    Objective:   Physical Exam   BP (!) 160/80   Pulse (!) 54   Temp 97.7 F (36.5 C)   Ht 6' 1.5" (1.867 m)   Wt 211 lb (95.7 kg)   SpO2 97%   BMI 27.46 kg/m   HEENT - WNL. Neck - supple.  Chest - Clear equal BS. Cor - Nl HS. RRR w/o sig MGR. PP 1(+). No edema. MS- FROM w/o deformities.  Gait Nl. GU - deferred to U/A. Neuro -  Nl w/o focal abnormalities.    Assessment & Plan:   1. Essential hypertension  - advised to restart his Losartan and monitor BP's & call if remain elevated.   2. Prostatitis, hematospermia by hx  - Urinalysis w microscopic + reflex cultur - Urine Culture  - discussed diet  / exercise / meds / SE's.

## 2016-10-23 LAB — URINALYSIS W MICROSCOPIC + REFLEX CULTURE
BILIRUBIN URINE: NEGATIVE
Bacteria, UA: NONE SEEN /HPF
GLUCOSE, UA: NEGATIVE
HGB URINE DIPSTICK: NEGATIVE
HYALINE CAST: NONE SEEN /LPF
KETONES UR: NEGATIVE
LEUKOCYTE ESTERASE: NEGATIVE
NITRITES URINE, INITIAL: NEGATIVE
PH: 6 (ref 5.0–8.0)
PROTEIN: NEGATIVE
Specific Gravity, Urine: 1.012 (ref 1.001–1.03)
Squamous Epithelial / LPF: NONE SEEN /HPF (ref <=5)
WBC UA: NONE SEEN /HPF (ref 0–5)

## 2016-10-23 LAB — NO CULTURE INDICATED

## 2016-10-23 LAB — URINE CULTURE
MICRO NUMBER:: 80979564
Result:: NO GROWTH
SPECIMEN QUALITY:: ADEQUATE

## 2016-10-29 ENCOUNTER — Other Ambulatory Visit: Payer: Self-pay | Admitting: Internal Medicine

## 2016-10-29 DIAGNOSIS — R361 Hematospermia: Secondary | ICD-10-CM | POA: Diagnosis not present

## 2016-10-29 DIAGNOSIS — C61 Malignant neoplasm of prostate: Secondary | ICD-10-CM | POA: Diagnosis not present

## 2016-10-29 NOTE — Assessment & Plan Note (Signed)
   Continue diet and medications  Lipid panel

## 2016-10-29 NOTE — Progress Notes (Signed)
FOLLOW UP  Assessment and Plan:   Essential hypertension  Bisoprolol-hctz (ZIAC) 5-6.25 mg, daily  Losartan 50 mg, daily  Doxazosin (CARDURA) 8 mg, 1/2-1 daily  monitor blood pressure at home.   Continue DASH diet.    Reminder to go to the ER if any CP, SOB, nausea, dizziness, severe HA, changes vision/speech, left arm numbness and tingling, and jaw pain.  Hyperlipidemia  Continue diet and medications  Lipid panel  Vitamin D deficiency  Cholecalciferol 5000 U daily  Vitamin D level  Prediabetes  Managed by diet and exercise  Monitor HbA1C  Medication management  Obtain and review labs as appropriate and necessary  Prostate cancer [facility])  Managed by urology- was seen this past week, but there was an issue with the specimen for PSA check. Requests this be redrawn today.   PSA   Continue diet and meds as discussed. Further disposition pending results of labs. Discussed med's effects and SE's.   Over 30 minutes of exam, counseling, chart review, and critical decision making was performed.   Future Appointments Date Time Provider [place]  02/25/2017 9:00 AM [name], MD [facility] None    ----------------------------------------------------------------------------------------------------------------------  HPI 83 y.o. male  presents for 3 month follow up on hypertension, cholesterol, diabetes and vitamin D deficiency.   He saw urology for monitoring of prostate cancer, as well as follow up on bloody ejaculate x2 that he experienced recently and was treated for. He reports there was an issue with the specimen for PSA check, and would like this to be added to the labs obtained today.  His blood pressure has been controlled at home- reports values as low as 90/50s, today their BP is BP: 120/80   He does workout. He denies chest pain, shortness of breath, dizziness, syncopal episodes or falls.    He is not on cholesterol medication and  denies myalgias. His cholesterol is not at goal. The cholesterol last visit was above goal:   Lab Results  Component Value Date   CHOL 214 (H) 06/25/2016   HDL 68 06/25/2016   LDLCALC 111 (H) 06/25/2016   TRIG 177 (H) 06/25/2016   CHOLHDL 3.1 06/25/2016    He has been working on diet and exercise for prediabetes, and denies hyperglycemia, hypoglycemia , paresthesia of the feet, polydipsia, polyuria and visual disturbances. Last A1C in the office was:  Lab Results  Component Value Date   HGBA1C 5.1 06/25/2016   Patient is on Vitamin D supplement.   Lab Results  Component Value Date   VD25OH 61 06/25/2016          Current Medications:  Current Outpatient Prescriptions on File Prior to Visit  Medication Sig  . aspirin EC 81 MG tablet Take 81 mg by mouth daily.  . bisoprolol-hydrochlorothiazide (ZIAC) 5-6.25 MG tablet Take 1 tablet each morning for BP  . Cholecalciferol (VITAMIN D-3) 5000 UNITS TABS Take by mouth.  . doxazosin (CARDURA) 8 MG tablet Take 1/2 to 1 tablet at Bedtime for Prostate & BP  . finasteride (PROSCAR) 5 MG tablet Take 1 tablet (5 mg total) by mouth daily.  Javier Docker Oil 1000 MG CAPS Take 1,000 mg by mouth daily.  . LUTEIN PO Take 5 mg by mouth 2 (two) times daily.  Marland Kitchen neomycin-polymyxin-hydrocortisone (CORTISPORIN) 3.5-10000-1 otic suspension Place 4 drops into both ears 4 (four) times daily.   No current facility-administered medications on file prior to visit.      Allergies:  Allergies  Allergen Reactions  .  Adhesive [Tape]   . Celebrex [Celecoxib] Other (See Comments)    GI upset  . Lipitor [Atorvastatin]   . Nickel   . Ppd [Tuberculin Purified Protein Derivative] Other (See Comments)    Positive PPD     Medical History:  Past Medical History:  Diagnosis Date  . Allergic rhinitis   . Cancer [facility])    prostate  . DJD (degenerative joint disease)   . Hypertension    Borderline   . Sinus bradycardia   . Vitamin D deficiency    Family  history- Reviewed and unchanged Social history- Reviewed and unchanged   Review of Systems:  Review of Systems  Constitutional: Negative.   HENT: Negative for congestion, hearing loss and sore throat.   Eyes: Negative for blurred vision.  Respiratory: Negative for cough, shortness of breath and wheezing.   Cardiovascular: Negative for chest pain, palpitations, claudication and leg swelling.  Gastrointestinal: Negative for abdominal pain, blood in stool, constipation, heartburn, melena, nausea and vomiting.  Genitourinary: Negative.   Musculoskeletal: Negative for falls and myalgias.  Skin: Negative.   Neurological: Negative for dizziness, sensory change and headaches.  Endo/Heme/Allergies: Negative.   Psychiatric/Behavioral: Negative.       Physical Exam: BP 120/80   Pulse (!) 57   Temp 97.7 F (36.5 C)   Resp 16   Ht 6' 1.5" (1.867 m)   Wt 209 lb 12.8 oz (95.2 kg)   SpO2 97%   BMI 27.30 kg/m  Wt Readings from Last 3 Encounters:  10/30/16 209 lb 12.8 oz (95.2 kg)  10/22/16 211 lb (95.7 kg)  07/09/16 212 lb 6.4 oz (96.3 kg)   General Appearance: Well nourished, in no apparent distress. Eyes: PERRLA, EOMs, conjunctiva no swelling or erythema Sinuses: No Frontal/maxillary tenderness ENT/Mouth: Ext aud canals clear, TMs without erythema, bulging. No erythema, swelling, or exudate on post pharynx.  Tonsils not swollen or erythematous. Hearing normal.  Neck: Supple, thyroid normal.  Respiratory: Respiratory effort normal, BS equal bilaterally without rales, rhonchi, wheezing or stridor.  Cardio: RRR with no MRGs. Brisk peripheral pulses without edema.  Abdomen: Soft, + BS.  Non tender, no guarding, rebound, hernias, masses. Lymphatics: Non tender without lymphadenopathy.  Musculoskeletal: Full ROM, 5/5 strength, Normal gait Skin: Warm, dry without rashes, lesions, ecchymosis.  Neuro: Cranial nerves intact. No cerebellar symptoms.  Psych: Awake and oriented X 3, normal  affect, Insight and Judgment appropriate.    [name], NP 9:23 AM [facility]

## 2016-10-29 NOTE — Assessment & Plan Note (Signed)
   Cholecalciferol 5000 U daily  Vitamin D level

## 2016-10-29 NOTE — Assessment & Plan Note (Signed)
   Managed by diet and exercise  Monitor HbA1C

## 2016-10-29 NOTE — Assessment & Plan Note (Signed)
   Bisoprolol-hctz [facility]) 5-6.25 mg, daily  Losartan 50 mg, daily  Doxazosin (CARDURA) 8 mg, 1/2-1 daily  monitor blood pressure at home.   Continue DASH diet.    Reminder to go to the ER if any CP, SOB, nausea, dizziness, severe HA, changes vision/speech, left arm numbness and tingling, and jaw pain.

## 2016-10-29 NOTE — Assessment & Plan Note (Signed)
   Obtain and review labs as appropriate and necessary

## 2016-10-30 ENCOUNTER — Other Ambulatory Visit: Payer: Self-pay

## 2016-10-30 ENCOUNTER — Ambulatory Visit (INDEPENDENT_AMBULATORY_CARE_PROVIDER_SITE_OTHER): Payer: Medicare Other | Admitting: Adult Health

## 2016-10-30 ENCOUNTER — Encounter: Payer: Self-pay | Admitting: Adult Health

## 2016-10-30 VITALS — BP 120/80 | HR 57 | Temp 97.7°F | Resp 16 | Ht 73.5 in | Wt 209.8 lb

## 2016-10-30 DIAGNOSIS — I10 Essential (primary) hypertension: Secondary | ICD-10-CM

## 2016-10-30 DIAGNOSIS — C61 Malignant neoplasm of prostate: Secondary | ICD-10-CM

## 2016-10-30 DIAGNOSIS — K589 Irritable bowel syndrome without diarrhea: Secondary | ICD-10-CM

## 2016-10-30 DIAGNOSIS — E782 Mixed hyperlipidemia: Secondary | ICD-10-CM

## 2016-10-30 DIAGNOSIS — Z79899 Other long term (current) drug therapy: Secondary | ICD-10-CM | POA: Diagnosis not present

## 2016-10-30 DIAGNOSIS — R7303 Prediabetes: Secondary | ICD-10-CM | POA: Diagnosis not present

## 2016-10-30 DIAGNOSIS — Z23 Encounter for immunization: Secondary | ICD-10-CM

## 2016-10-30 DIAGNOSIS — E559 Vitamin D deficiency, unspecified: Secondary | ICD-10-CM | POA: Diagnosis not present

## 2016-10-30 MED ORDER — TADALAFIL 10 MG PO TABS: ORAL_TABLET | ORAL | 3 refills | Status: DC

## 2016-10-30 MED ORDER — HYOSCYAMINE SULFATE 0.125 MG SL SUBL: SUBLINGUAL_TABLET | SUBLINGUAL | 1 refills | Status: DC

## 2016-10-30 NOTE — Assessment & Plan Note (Signed)
   Managed by urology- was seen this past week, but there was an issue with the specimen for PSA check. Requests this be redrawn today.   PSA

## 2016-10-30 NOTE — Patient Instructions (Addendum)
Check your BP daily and bring a log next time you come. We may need to reduce your medications based on the results.     Here is some information to help you keep your heart healthy: Move it! - Aim for 30 mins of activity every day. Take it slowly at first. Talk to [place] before starting any new exercise program.   Lose it.  -Body Mass Index (BMI) can indicate if you need to lose weight. A healthy range is 18.5-24.9. For a BMI calculator, go to [employer]  Waist Management -Excess abdominal fat is a risk factor for heart disease, diabetes, asthma, stroke and more. Ideal waist circumference is less than 35" for women and less than 40" for men.   Eat Right -focus on fruits, vegetables, whole grains, and meals you make yourself. Avoid foods with trans fat and high sugar/sodium content.   Snooze or Snore? - Loud snoring can be a sign of sleep apnea, a significant risk factor for high blood pressure, heart attach, stroke, and heart arrhythmias.  Kick the habit -Quit Smoking! Avoid second hand smoke. A single cigarette raises your blood pressure for 20 mins and increases the risk of heart attack and stroke for the next 24 hours.   Are Aspirin and Supplements right for you? -Add ENTERIC COATED low dose 81 mg Aspirin daily OR can do every other day if you have easy bruising to protect your heart and head. As well as to reduce risk of Colon Cancer by 20 %, Skin Cancer by 26 % , Melanoma by 46% and Pancreatic cancer by 60%  Say "No to Stress -There may be little you can do about problems that cause stress. However, techniques such as long walks, meditation, and exercise can help you manage it.   Start Now! - Make changes one at a time and set reasonable goals to increase your likelihood of success.

## 2016-10-31 LAB — CBC WITH DIFFERENTIAL/PLATELET
Basophils Absolute: 32 cells/uL (ref 0–200)
Basophils Relative: 0.6 %
EOS PCT: 2.6 %
Eosinophils Absolute: 140 cells/uL (ref 15–500)
HCT: 42.4 % (ref 38.5–50.0)
HEMOGLOBIN: 14.1 g/dL (ref 13.2–17.1)
Lymphs Abs: 1296 cells/uL (ref 850–3900)
MCH: 30.4 pg (ref 27.0–33.0)
MCHC: 33.3 g/dL (ref 32.0–36.0)
MCV: 91.4 fL (ref 80.0–100.0)
MPV: 10.1 fL (ref 7.5–12.5)
Monocytes Relative: 12.1 %
NEUTROS ABS: 3278 {cells}/uL (ref 1500–7800)
Neutrophils Relative %: 60.7 %
Platelets: 193 10*3/uL (ref 140–400)
RBC: 4.64 10*6/uL (ref 4.20–5.80)
RDW: 12.7 % (ref 11.0–15.0)
Total Lymphocyte: 24 %
WBC mixed population: 653 cells/uL (ref 200–950)
WBC: 5.4 10*3/uL (ref 3.8–10.8)

## 2016-10-31 LAB — HEMOGLOBIN A1C
HEMOGLOBIN A1C: 5.1 %{Hb} (ref <5.7)
MEAN PLASMA GLUCOSE: 100 (calc)
eAG (mmol/L): 5.5 (calc)

## 2016-10-31 LAB — BASIC METABOLIC PANEL WITH GFR
BUN/Creatinine Ratio: 19 (calc) (ref 6–22)
BUN: 28 mg/dL — ABNORMAL HIGH (ref 7–25)
CALCIUM: 9.3 mg/dL (ref 8.6–10.3)
CO2: 26 mmol/L (ref 20–32)
CREATININE: 1.45 mg/dL — AB (ref 0.70–1.11)
Chloride: 101 mmol/L (ref 98–110)
GFR, EST NON AFRICAN AMERICAN: 43 mL/min/{1.73_m2} — AB (ref >=60)
GFR, Est African American: 50 mL/min/{1.73_m2} — ABNORMAL LOW (ref >=60)
Glucose, Bld: 96 mg/dL (ref 65–99)
POTASSIUM: 4.6 mmol/L (ref 3.5–5.3)
Sodium: 135 mmol/L (ref 135–146)

## 2016-10-31 LAB — HEPATIC FUNCTION PANEL
AG RATIO: 1.7 (calc) (ref 1.0–2.5)
ALKALINE PHOSPHATASE (APISO): 60 U/L (ref 40–115)
ALT: 18 U/L (ref 9–46)
AST: 17 U/L (ref 10–35)
Albumin: 4.4 g/dL (ref 3.6–5.1)
BILIRUBIN TOTAL: 0.6 mg/dL (ref 0.2–1.2)
Bilirubin, Direct: 0.1 mg/dL (ref 0.0–0.2)
GLOBULIN: 2.6 g/dL (ref 1.9–3.7)
Indirect Bilirubin: 0.5 mg/dL (calc) (ref 0.2–1.2)
Total Protein: 7 g/dL (ref 6.1–8.1)

## 2016-10-31 LAB — LIPID PANEL
CHOL/HDL RATIO: 3.1 (calc) (ref <5.0)
Cholesterol: 222 mg/dL — ABNORMAL HIGH (ref <200)
HDL: 72 mg/dL (ref >40)
LDL CHOLESTEROL (CALC): 130 mg/dL — AB
NON-HDL CHOLESTEROL (CALC): 150 mg/dL — AB (ref <130)
TRIGLYCERIDES: 92 mg/dL (ref <150)

## 2016-10-31 LAB — PSA: PSA: 3.7 ng/mL (ref <=4.0)

## 2016-11-05 DIAGNOSIS — H524 Presbyopia: Secondary | ICD-10-CM | POA: Diagnosis not present

## 2016-11-05 DIAGNOSIS — H40013 Open angle with borderline findings, low risk, bilateral: Secondary | ICD-10-CM | POA: Diagnosis not present

## 2016-11-05 DIAGNOSIS — H353122 Nonexudative age-related macular degeneration, left eye, intermediate dry stage: Secondary | ICD-10-CM | POA: Diagnosis not present

## 2016-11-06 ENCOUNTER — Encounter: Payer: Self-pay | Admitting: Internal Medicine

## 2016-11-06 ENCOUNTER — Other Ambulatory Visit: Payer: Self-pay | Admitting: Internal Medicine

## 2016-11-08 ENCOUNTER — Other Ambulatory Visit: Payer: Self-pay | Admitting: Internal Medicine

## 2016-11-08 DIAGNOSIS — I10 Essential (primary) hypertension: Secondary | ICD-10-CM

## 2016-11-08 MED ORDER — BISOPROLOL-HYDROCHLOROTHIAZIDE 2.5-6.25 MG PO TABS: ORAL_TABLET | ORAL | 1 refills | Status: DC

## 2016-11-25 ENCOUNTER — Encounter: Payer: Self-pay | Admitting: Adult Health

## 2016-11-25 ENCOUNTER — Ambulatory Visit (INDEPENDENT_AMBULATORY_CARE_PROVIDER_SITE_OTHER): Payer: Medicare Other | Admitting: Adult Health

## 2016-11-25 VITALS — BP 158/84 | HR 76 | Temp 97.7°F | Resp 18 | Ht 73.5 in | Wt 210.6 lb

## 2016-11-25 DIAGNOSIS — R0989 Other specified symptoms and signs involving the circulatory and respiratory systems: Secondary | ICD-10-CM | POA: Diagnosis not present

## 2016-11-25 MED ORDER — LOSARTAN POTASSIUM 25 MG PO TABS: 25.0000 mg | ORAL_TABLET | Freq: Every day | ORAL | 2 refills | Status: DC

## 2016-11-25 NOTE — Progress Notes (Signed)
Assessment and Plan:  Eugene Friedman was seen today for hypertension.  Diagnoses and all orders for this visit:  Labile hypertension -     losartan (COZAAR) 25 MG tablet; Take 1 tablet (25 mg total) by mouth daily. Take 1-2 tabs daily -     Take BP daily; create log to present at next visit -     Start by taking cozaar 50 mg daily; if BPs stabilize below 140/90, we will stay on this dose for a while. If BP lower than 110/70 on this dose, will have him go down to 25 mg.  -     Call in 1 week should BPs remain above 140/90 on 50 mg dose -     Follow up for BP/labs in 4 weeks  Further disposition pending results of labs. Discussed med's effects and SE's.   Over 20 minutes of exam, counseling, chart review, and critical decision making was performed.   Future Appointments Date Time Provider [place]  12/21/2016 11:00 AM Liane Comber, NP [facility] None  03/03/2017 2:00 PM [name], MD [facility] None    ------------------------------------------------------------------------------------------------------------------   HPI BP (!) 162/86   Pulse 76   Temp 97.7 F (36.5 C)   Resp 18   Ht 6' 1.5" (1.867 m)   Wt 210 lb 9.6 oz (95.5 kg)   BMI 27.41 kg/m   Eugene Friedman presents for follow up for BP with concerns of symptomatic hypotension (fatigue, "feel like a zombie) on readings at home, with BPs running 100s/50s. The patient was taking  bisoprolol-hctz 2.5-6.25, 1/2 tab, and doxazosin (1/2 tab) for underlying BPH/prostate cancer which is being monitored.    He reports he stopped taking the bisoprolol/hctz 1 week ago after he became very dizzy while working on his boat, and now presents with BP of 162/86, manual repeat by provider results 158/84.   Patient also wants to discuss daily aspirin; discussed cardiovascular benefits as well as cancer prevention data. The patient denies concerning bleeding (nose bleeds, from gums, rectal bleeding, excessive bleeding with small injuries)  other than a previously discussed single episode of blood in his ejaculate for which he followed up with urology.   Past Medical History:  Diagnosis Date  . Allergic rhinitis   . Cancer [facility])    prostate  . DJD (degenerative joint disease)   . Hypertension    Borderline   . Sinus bradycardia   . Vitamin D deficiency      Allergies  Allergen Reactions  . Adhesive [Tape]   . Celebrex [Celecoxib] Other (See Comments)    GI upset  . Lipitor [Atorvastatin]   . Nickel   . Ppd [Tuberculin Purified Protein Derivative] Other (See Comments)    Positive PPD    Current Outpatient Prescriptions on File Prior to Visit  Medication Sig  . aspirin EC Eugene MG tablet Take Eugene mg by mouth daily.  . Cholecalciferol (VITAMIN D-3) 5000 UNITS TABS Take by mouth.  . doxazosin (CARDURA) 8 MG tablet Take 1/2 to 1 tablet at Bedtime for Prostate & BP  . finasteride (PROSCAR) 5 MG tablet Take 1 tablet (5 mg total) by mouth daily.  . hyoscyamine (LEVSIN SL) 0.125 MG SL tablet Take 1 to 2 tablets 4 x / day if needed for abdominal cramping, nausea or Diarrhea  . Krill Oil 1000 MG CAPS Take 1,000 mg by mouth daily.  . LUTEIN PO Take 5 mg by mouth 2 (two) times daily.  Marland Kitchen neomycin-polymyxin-hydrocortisone (CORTISPORIN) 3.5-10000-1 otic suspension Place  4 drops into both ears 4 (four) times daily.  . tadalafil (CIALIS) 10 MG tablet 1/2-1 pill daily as directed.  . bisoprolol-hydrochlorothiazide (ZIAC) 2.5-6.25 MG tablet Take 1/2 to 1 tablet daily as directed for BP (Patient not taking: Reported on 11/25/2016)   No current facility-administered medications on file prior to visit.     ROS: Review of Systems  Constitutional: Negative for diaphoresis and malaise/fatigue.  Eyes: Negative for blurred vision.  Respiratory: Negative for shortness of breath and wheezing.   Cardiovascular: Negative for chest pain, palpitations, orthopnea and leg swelling.  Musculoskeletal: Negative for falls.  Neurological: Positive  for dizziness and weakness (With previous low BP- denies today). Negative for tingling, tremors, sensory change, speech change, focal weakness, seizures, loss of consciousness and headaches.  Endo/Heme/Allergies: Does not bruise/bleed easily.  All other systems reviewed and are negative.    Physical Exam:  BP (!) 162/86   Pulse 76   Temp 97.7 F (36.5 C)   Resp 18   Ht 6' 1.5" (1.867 m)   Wt 210 lb 9.6 oz (95.5 kg)   BMI 27.41 kg/m   General Appearance: Well nourished, in no apparent distress. Neck: Supple. Respiratory: Respiratory effort normal, BS equal bilaterally without rales, rhonchi, wheezing or stridor.  Cardio: RRR with no MRGs. Brisk peripheral pulses without edema.  Abdomen: Soft, + BS.  Non tender, no guarding, rebound, hernias, masses. Lymphatics: Non tender without lymphadenopathy.  Musculoskeletal: normal gait.  Skin: Warm, dry without rashes, lesions, ecchymosis.  Neuro: Cranial nerves intact. Normal muscle tone, no cerebellar symptoms. Sensation intact.  Psych: Awake and oriented X 3, normal affect, Insight and Judgment appropriate.     Eugene Ribas, NP 2:48 PM [facility]

## 2016-11-25 NOTE — Patient Instructions (Addendum)
Take BP daily and make a log to bring at the next visit- document date, time, and how much of the blood pressure medication you took that day.  Take 2 tabs (50 mg) daily- if your BP becomes lower than 120/80 consistently, go down to 1 tab (25 mg).   Call in 1 week if your BP remains over 140/90 despite treatment with losartan 50 mg daily.    Losartan tablets What is this medicine? LOSARTAN (loe SAR tan) is used to treat high blood pressure and to reduce the risk of stroke in certain patients. This drug also slows the progression of kidney disease in patients with diabetes. This medicine may be used for other purposes; ask your health care provider or pharmacist if you have questions. COMMON BRAND NAME(S): Cozaar What should I tell my health care provider before I take this medicine? They need to know if you have any of these conditions: -heart failure -kidney or liver disease -an unusual or allergic reaction to losartan, other medicines, foods, dyes, or preservatives -pregnant or trying to get pregnant -breast-feeding How should I use this medicine? Take this medicine by mouth with a glass of water. Follow the directions on the prescription label. This medicine can be taken with or without food. Take your doses at regular intervals. Do not take your medicine more often than directed. Talk to your pediatrician regarding the use of this medicine in children. Special care may be needed. Overdosage: If you think you have taken too much of this medicine contact a poison control center or emergency room at once. NOTE: This medicine is only for you. Do not share this medicine with others. What if I miss a dose? If you miss a dose, take it as soon as you can. If it is almost time for your next dose, take only that dose. Do not take double or extra doses. What may interact with this medicine? -blood pressure medicines -diuretics, especially triamterene, spironolactone, or  amiloride -fluconazole -NSAIDs, medicines for pain and inflammation, like ibuprofen or naproxen -potassium salts or potassium supplements -rifampin This list may not describe all possible interactions. Give your health care provider a list of all the medicines, herbs, non-prescription drugs, or dietary supplements you use. Also tell them if you smoke, drink alcohol, or use illegal drugs. Some items may interact with your medicine. What should I watch for while using this medicine? Visit your doctor or health care professional for regular checks on your progress. Check your blood pressure as directed. Ask your doctor or health care professional what your blood pressure should be and when you should contact him or her. Call your doctor or health care professional if you notice an irregular or fast heart beat. Women should inform their doctor if they wish to become pregnant or think they might be pregnant. There is a potential for serious side effects to an unborn child, particularly in the second or third trimester. Talk to your health care professional or pharmacist for more information. You may get drowsy or dizzy. Do not drive, use machinery, or do anything that needs mental alertness until you know how this drug affects you. Do not stand or sit up quickly, especially if you are an older patient. This reduces the risk of dizzy or fainting spells. Alcohol can make you more drowsy and dizzy. Avoid alcoholic drinks. Avoid salt substitutes unless you are told otherwise by your doctor or health care professional. Do not treat yourself for coughs, colds, or pain while you  are taking this medicine without asking your doctor or health care professional for advice. Some ingredients may increase your blood pressure. What side effects may I notice from receiving this medicine? Side effects that you should report to your doctor or health care professional as soon as possible: -confusion, dizziness, light  headedness or fainting spells -decreased amount of urine passed -difficulty breathing or swallowing, hoarseness, or tightening of the throat -fast or irregular heart beat, palpitations, or chest pain -skin rash, itching -swelling of your face, lips, tongue, hands, or feet Side effects that usually do not require medical attention (report to your doctor or health care professional if they continue or are bothersome): -cough -decreased sexual function or desire -headache -nasal congestion or stuffiness -nausea or stomach pain -sore or cramping muscles This list may not describe all possible side effects. Call your doctor for medical advice about side effects. You may report side effects to FDA at 1-800-FDA-1088. Where should I keep my medicine? Keep out of the reach of children. Store at room temperature between 15 and 30 degrees C (59 and 86 degrees F). Protect from light. Keep container tightly closed. Throw away any unused medicine after the expiration date. NOTE: This sheet is a summary. It may not cover all possible information. If you have questions about this medicine, talk to your doctor, pharmacist, or health care provider.  2018 Elsevier/Gold Standard (2007-04-15 16:42:18)

## 2016-11-29 ENCOUNTER — Encounter: Payer: Self-pay | Admitting: Internal Medicine

## 2016-11-30 ENCOUNTER — Ambulatory Visit (INDEPENDENT_AMBULATORY_CARE_PROVIDER_SITE_OTHER): Payer: Medicare Other | Admitting: Internal Medicine

## 2016-11-30 ENCOUNTER — Encounter: Payer: Self-pay | Admitting: Internal Medicine

## 2016-11-30 VITALS — BP 144/92 | HR 80 | Temp 98.5°F | Resp 16 | Ht 73.5 in | Wt 208.0 lb

## 2016-11-30 DIAGNOSIS — Z136 Encounter for screening for cardiovascular disorders: Secondary | ICD-10-CM | POA: Diagnosis not present

## 2016-11-30 DIAGNOSIS — R002 Palpitations: Secondary | ICD-10-CM | POA: Diagnosis not present

## 2016-11-30 DIAGNOSIS — R0989 Other specified symptoms and signs involving the circulatory and respiratory systems: Secondary | ICD-10-CM

## 2016-11-30 MED ORDER — BISOPROLOL FUMARATE 5 MG PO TABS: ORAL_TABLET | ORAL | 1 refills | Status: DC

## 2016-11-30 NOTE — Progress Notes (Signed)
Subjective:    Patient ID: Eugene Friedman, male    DOB: 06-07-1928, 83 y.o.   MRN: [id number]  HPI  This nice 83 yo WWM Swede returns in f/u recently restarting his losartan along his Ziac-2.5 in early Sept. Then he was recently seen on Sept 14 with c/o occas low BP's and he had d/c'd his Ziac -2.5. He subsequently noted on bid BP checks that his pulse were faster in the 80-100 range and his Internet search lead him to stop his Losartan again and now his BP's are rising again the last 2-3 days. He is having some palpitations since stopping Ziac, but denies any CP - exertional or otherwise. He's very anxious about his heart . He's reminded that he has had several negative stress nuclear scans in the past.  Outpatient Medications Prior to Visit  Medication Sig  . aspirin EC 81 MG tablet Take  daily.  Marland Kitchen VITAMIN D 5000 UNITS Take daily  . doxazosin  8 MG tablet Take 1/2 to 1 tab at Bedtime for Prostate & BP  . finasteride  5 MG tablet Take 1 tab daily.  . hyoscyamine SL 0.125 MG SL  Take 1 to 2 tab 4 x / day if needed   . Krill Oil 1000 MG CAPS Take  daily.  . LUTEIN PO Take 5 mg  2 x daily.  . CORTISPORIN  otic susp Place 4 drops into  ears 4 x daily prn  . tadalafil  10 MG tablet 1/2-1 pill daily as directed.  Marland Kitchen losartan  25 MG tablet Take 1-2 tabs daily (not taking: Reported on 11/30/2016)  . bisoprolol-hydrochlorothiazide (ZIAC) 2.5-6.25 MG tablet Take 1/2-1 tabl daily (not taking: Reported on 11/25/2016)   Allergies  Allergen Reactions  . Adhesive [Tape]   . Celebrex [Celecoxib] Other (See Comments)    GI upset  . Lipitor [Atorvastatin]   . Nickel   . Ppd [Tuberculin Purified Protein Derivative] Other (See Comments)    Positive PPD   Past Medical History:  Diagnosis Date  . Allergic rhinitis   . Cancer [facility])    prostate  . DJD (degenerative joint disease)   . Hypertension    Borderline   . Sinus bradycardia   . Vitamin D deficiency    Review of Systems  10 point systems  review negative except as above.    Objective:   Physical Exam  BP (!) 144/92   Pulse 80   Temp 98.5 F (36.9 C)   Resp 16   Ht 6' 1.5" (1.867 m)   Wt 208 lb (94.3 kg)   BMI 27.07 kg/m    Postural Sitting  BP 140/80 - P 62         And           standing BP 128/71 - P 70   HEENT - Eac's patent. TM's Nl. EOM's full. PERRLA. NasoOroPharynx clear. Neck - supple. Nl Thyroid. Carotids 2+ & No bruits, nodes, JVD Chest - Clear equal BS w/o Rales, rhonchi, wheezes. Cor - Nl HS. RRR w/o sig MGR. PP 1(+). No edema. MS- FROM w/o deformities. Muscle power, tone and bulk Nl. Gait Nl. Neuro - Nl w/o focal abnormalities. Psyche - Mental status normal & appropriate.    Assessment & Plan:   1. Labile hypertension  - advised to hold Losartan 25 for Now unless SBP>160  - bisoprolol (ZEBETA) 5 MG tablet; Take 1/2 to 1 tablet daily for BP and Pulse Rate  Dispense: 90  tablet; Refill: 1  2. Palpitations  - EKG 12-Lead - WNL

## 2016-12-20 NOTE — Progress Notes (Signed)
FOLLOW UP  Assessment and Plan:   Hypertension Currently well controlled with current medications based on home logs; patient may split the beta blocker and take BID if concerned of elevated evening BP readings Monitor blood pressure at home; patient to call if consistently greater than 130/80 Continue DASH diet.   Reminder to go to the ER if any CP, SOB, nausea, dizziness, severe HA, changes vision/speech, left arm numbness and tingling and jaw pain.  Cholesterol Currently still above target  Discussed low cholesterol diet and exercise.  Check lipid panel.   History of elevated glucose Continue diet and exercise.  Perform daily foot/skin check, notify office of any concerning changes.  Check A1C   Continue diet and meds as discussed. Further disposition pending results of labs. Discussed med's effects and SE's.   Over 30 minutes of exam, counseling, chart review, and critical decision making was performed.   Future Appointments  Date Time Provider [place]  03/17/2017  2:00 PM [name], MD [facility] None    ----------------------------------------------------------------------------------------------------------------------  HPI 83 y.o. male  presents for 3 month follow up on hypertension, cholesterol, history of elevated glucose and vitamin D deficiency. He reports his hearing aids have been causing discomfort and he has not been wearing them; he reports frequent external auditory canal infections and requests refill on ear drops which will be provided today.   He has recently been seen frequently for labile hypertension with several medication changes; last seen 11/30/2016 - current regimen: bisoprolol 5 mg daily, doxazosin 8 mg daily, losartan 25 mg PRN  His blood pressure has been controlled at home (120-130s over 70s on daily log), today their BP is BP: 124/68  He does workout. He denies chest pain, shortness of breath, dizziness.   He is not on  cholesterol medication (currently on fish oil supplement). His cholesterol is not at goal. The cholesterol last visit was:   Lab Results  Component Value Date   CHOL 222 (H) 10/30/2016   HDL 72 10/30/2016   LDLCALC 111 (H) 06/25/2016   TRIG 92 10/30/2016   CHOLHDL 3.1 10/30/2016    He has been working on diet and exercise for prediabetes, and denies increased appetite, nausea, paresthesia of the feet, polydipsia and polyuria. Last A1C in the office was:  Lab Results  Component Value Date   HGBA1C 5.1 10/30/2016   Patient is on Vitamin D supplement and at goal Lab Results  Component Value Date   VD25OH 61 06/25/2016        Current Medications:  Current Outpatient Medications on File Prior to Visit  Medication Sig  . aspirin EC 81 MG tablet Take 81 mg by mouth daily.  . bisoprolol (ZEBETA) 5 MG tablet Take 1/2 to 1 tablet daily for BP and Pulse Rate  . Cholecalciferol (VITAMIN D-3) 5000 UNITS TABS Take by mouth.  . doxazosin (CARDURA) 8 MG tablet Take 1/2 to 1 tablet at Bedtime for Prostate & BP  . finasteride (PROSCAR) 5 MG tablet Take 1 tablet (5 mg total) by mouth daily.  Javier Docker Oil 1000 MG CAPS Take 1,000 mg by mouth daily.  Marland Kitchen losartan (COZAAR) 25 MG tablet Take 1 tablet (25 mg total) by mouth daily. Take 1-2 tabs daily  . LUTEIN PO Take 5 mg by mouth 2 (two) times daily.   No current facility-administered medications on file prior to visit.      Allergies:  Allergies  Allergen Reactions  . Adhesive [Tape]   . Celebrex [Celecoxib] Other (  See Comments)    GI upset  . Lipitor [Atorvastatin]   . Nickel   . Ppd [Tuberculin Purified Protein Derivative] Other (See Comments)    Positive PPD     Medical History:  Past Medical History:  Diagnosis Date  . Allergic rhinitis   . Cancer [facility])    prostate  . DJD (degenerative joint disease)   . Hypertension    Borderline   . Sinus bradycardia   . Vitamin D deficiency    Family history- Reviewed and unchanged Social  history- Reviewed and unchanged   Review of Systems:  Review of Systems  Constitutional: Negative for malaise/fatigue and weight loss.  HENT: Negative for hearing loss and tinnitus.   Eyes: Negative for blurred vision and double vision.  Respiratory: Negative for cough, shortness of breath and wheezing.   Cardiovascular: Negative for chest pain, palpitations, orthopnea, claudication and leg swelling.  Gastrointestinal: Negative for abdominal pain, blood in stool, constipation, diarrhea, heartburn, melena, nausea and vomiting.  Genitourinary: Negative.   Musculoskeletal: Negative for falls, joint pain and myalgias.  Skin: Negative for rash.  Neurological: Negative for dizziness, tingling, sensory change, weakness and headaches.  Endo/Heme/Allergies: Negative for polydipsia.  Psychiatric/Behavioral: Negative.   All other systems reviewed and are negative.   Physical Exam: BP 124/68   Pulse (!) 54   Temp 97.9 F (36.6 C)   Ht 6' 1.5" (1.867 m)   Wt 209 lb 6.4 oz (95 kg)   SpO2 97%   BMI 27.25 kg/m  Wt Readings from Last 3 Encounters:  12/21/16 209 lb 6.4 oz (95 kg)  11/30/16 208 lb (94.3 kg)  11/25/16 210 lb 9.6 oz (95.5 kg)   General Appearance: Well nourished, in no apparent distress. Eyes: PERRLA, EOMs, conjunctiva no swelling or erythema Sinuses: No Frontal/maxillary tenderness ENT/Mouth: Bilateral external auditory canals inflamed without discharge, TMs without erythema, bulging. No erythema, swelling, or exudate on post pharynx.  Tonsils not swollen or erythematous. Hearing normal.  Neck: Supple, thyroid normal.  Respiratory: Respiratory effort normal, BS equal bilaterally without rales, rhonchi, wheezing or stridor.  Cardio: RRR with no MRGs. Brisk peripheral pulses without edema.  Abdomen: Soft, + BS.  Non tender, no guarding, rebound, hernias, masses. Lymphatics: Non tender without lymphadenopathy.  Musculoskeletal: Full ROM, 5/5 strength, Normal gait Skin: Warm,  dry without rashes, lesions, ecchymosis.  Neuro: Cranial nerves intact. No cerebellar symptoms.  Psych: Awake and oriented X 3, normal affect, Insight and Judgment appropriate.    [name], NP 11:54 AM [facility]

## 2016-12-21 ENCOUNTER — Ambulatory Visit (INDEPENDENT_AMBULATORY_CARE_PROVIDER_SITE_OTHER): Payer: Medicare Other | Admitting: Adult Health

## 2016-12-21 ENCOUNTER — Encounter: Payer: Self-pay | Admitting: Adult Health

## 2016-12-21 VITALS — BP 124/68 | HR 54 | Temp 97.9°F | Ht 73.5 in | Wt 209.4 lb

## 2016-12-21 DIAGNOSIS — K589 Irritable bowel syndrome without diarrhea: Secondary | ICD-10-CM

## 2016-12-21 DIAGNOSIS — E782 Mixed hyperlipidemia: Secondary | ICD-10-CM

## 2016-12-21 DIAGNOSIS — D649 Anemia, unspecified: Secondary | ICD-10-CM | POA: Diagnosis not present

## 2016-12-21 DIAGNOSIS — Z79899 Other long term (current) drug therapy: Secondary | ICD-10-CM

## 2016-12-21 DIAGNOSIS — Z8639 Personal history of other endocrine, nutritional and metabolic disease: Secondary | ICD-10-CM

## 2016-12-21 DIAGNOSIS — Z6825 Body mass index (BMI) 25.0-25.9, adult: Secondary | ICD-10-CM

## 2016-12-21 DIAGNOSIS — R0989 Other specified symptoms and signs involving the circulatory and respiratory systems: Secondary | ICD-10-CM

## 2016-12-21 MED ORDER — TADALAFIL 10 MG PO TABS: ORAL_TABLET | ORAL | 3 refills | Status: DC

## 2016-12-21 MED ORDER — HYOSCYAMINE SULFATE 0.125 MG SL SUBL: SUBLINGUAL_TABLET | SUBLINGUAL | 1 refills | Status: DC

## 2016-12-21 MED ORDER — NEOMYCIN-POLYMYXIN-HC 3.5-10000-1 OT SUSP: 4.0000 [drp] | Freq: Four times a day (QID) | OTIC | 1 refills | Status: DC

## 2016-12-21 NOTE — Patient Instructions (Signed)

## 2016-12-22 ENCOUNTER — Other Ambulatory Visit: Payer: Self-pay | Admitting: Adult Health

## 2016-12-22 DIAGNOSIS — D649 Anemia, unspecified: Secondary | ICD-10-CM | POA: Insufficient documentation

## 2016-12-22 LAB — BASIC METABOLIC PANEL WITH GFR
BUN/Creatinine Ratio: 18 (calc) (ref 6–22)
BUN: 27 mg/dL — AB (ref 7–25)
CALCIUM: 9.4 mg/dL (ref 8.6–10.3)
CO2: 27 mmol/L (ref 20–32)
CREATININE: 1.49 mg/dL — AB (ref 0.70–1.11)
Chloride: 104 mmol/L (ref 98–110)
GFR, EST NON AFRICAN AMERICAN: 42 mL/min/{1.73_m2} — AB (ref >=60)
GFR, Est African American: 49 mL/min/{1.73_m2} — ABNORMAL LOW (ref >=60)
Glucose, Bld: 91 mg/dL (ref 65–99)
Potassium: 4.8 mmol/L (ref 3.5–5.3)
Sodium: 137 mmol/L (ref 135–146)

## 2016-12-22 LAB — CBC WITH DIFFERENTIAL/PLATELET
BASOS PCT: 0.6 %
Basophils Absolute: 29 cells/uL (ref 0–200)
EOS ABS: 158 {cells}/uL (ref 15–500)
Eosinophils Relative: 3.3 %
HCT: 37.8 % — ABNORMAL LOW (ref 38.5–50.0)
Hemoglobin: 13 g/dL — ABNORMAL LOW (ref 13.2–17.1)
Lymphs Abs: 994 cells/uL (ref 850–3900)
MCH: 31.2 pg (ref 27.0–33.0)
MCHC: 34.4 g/dL (ref 32.0–36.0)
MCV: 90.6 fL (ref 80.0–100.0)
MONOS PCT: 11.9 %
MPV: 9.7 fL (ref 7.5–12.5)
Neutro Abs: 3048 cells/uL (ref 1500–7800)
Neutrophils Relative %: 63.5 %
PLATELETS: 191 10*3/uL (ref 140–400)
RBC: 4.17 10*6/uL — ABNORMAL LOW (ref 4.20–5.80)
RDW: 13 % (ref 11.0–15.0)
TOTAL LYMPHOCYTE: 20.7 %
WBC mixed population: 571 cells/uL (ref 200–950)
WBC: 4.8 10*3/uL (ref 3.8–10.8)

## 2016-12-22 LAB — LIPID PANEL
CHOL/HDL RATIO: 3.2 (calc) (ref <5.0)
Cholesterol: 198 mg/dL (ref <200)
HDL: 61 mg/dL (ref >40)
LDL Cholesterol (Calc): 109 mg/dL (calc) — ABNORMAL HIGH
NON-HDL CHOLESTEROL (CALC): 137 mg/dL — AB (ref <130)
Triglycerides: 158 mg/dL — ABNORMAL HIGH (ref <150)

## 2016-12-22 LAB — HEPATIC FUNCTION PANEL
AG RATIO: 1.8 (calc) (ref 1.0–2.5)
ALBUMIN MSPROF: 4.3 g/dL (ref 3.6–5.1)
ALT: 16 U/L (ref 9–46)
AST: 16 U/L (ref 10–35)
Alkaline phosphatase (APISO): 58 U/L (ref 40–115)
BILIRUBIN TOTAL: 0.8 mg/dL (ref 0.2–1.2)
Bilirubin, Direct: 0.1 mg/dL (ref 0.0–0.2)
GLOBULIN: 2.4 g/dL (ref 1.9–3.7)
Indirect Bilirubin: 0.7 mg/dL (calc) (ref 0.2–1.2)
Total Protein: 6.7 g/dL (ref 6.1–8.1)

## 2016-12-22 LAB — TEST AUTHORIZATION

## 2016-12-22 LAB — MAGNESIUM: MAGNESIUM: 2.2 mg/dL (ref 1.5–2.5)

## 2016-12-22 LAB — FERRITIN: FERRITIN: 116 ng/mL (ref 20–380)

## 2016-12-22 LAB — TSH: TSH: 2.8 mIU/L (ref 0.40–4.50)

## 2016-12-22 LAB — IRON, TOTAL/TOTAL IRON BINDING CAP
%SAT: 58 % (calc) (ref 15–60)
IRON: 165 ug/dL (ref 50–180)
TIBC: 285 ug/dL (ref 250–425)

## 2016-12-22 LAB — INSULIN, RANDOM: Insulin: 3.8 u[IU]/mL (ref 2.0–19.6)

## 2016-12-22 LAB — HEMOGLOBIN A1C
EAG (MMOL/L): 5.5 (calc)
HEMOGLOBIN A1C: 5.1 %{Hb} (ref <5.7)
MEAN PLASMA GLUCOSE: 100 (calc)

## 2016-12-24 ENCOUNTER — Other Ambulatory Visit: Payer: Self-pay | Admitting: Adult Health

## 2016-12-24 DIAGNOSIS — D649 Anemia, unspecified: Secondary | ICD-10-CM

## 2017-01-04 ENCOUNTER — Ambulatory Visit (INDEPENDENT_AMBULATORY_CARE_PROVIDER_SITE_OTHER): Payer: Medicare Other

## 2017-01-04 DIAGNOSIS — D649 Anemia, unspecified: Secondary | ICD-10-CM | POA: Diagnosis not present

## 2017-01-04 DIAGNOSIS — I10 Essential (primary) hypertension: Secondary | ICD-10-CM | POA: Diagnosis not present

## 2017-01-04 DIAGNOSIS — R0989 Other specified symptoms and signs involving the circulatory and respiratory systems: Secondary | ICD-10-CM

## 2017-01-04 LAB — CBC WITH DIFFERENTIAL/PLATELET
BASOS PCT: 0.8 %
Basophils Absolute: 38 cells/uL (ref 0–200)
Eosinophils Absolute: 197 cells/uL (ref 15–500)
Eosinophils Relative: 4.2 %
HEMATOCRIT: 36.6 % — AB (ref 38.5–50.0)
HEMOGLOBIN: 12.4 g/dL — AB (ref 13.2–17.1)
LYMPHS ABS: 987 {cells}/uL (ref 850–3900)
MCH: 30.8 pg (ref 27.0–33.0)
MCHC: 33.9 g/dL (ref 32.0–36.0)
MCV: 90.8 fL (ref 80.0–100.0)
MPV: 9.8 fL (ref 7.5–12.5)
Monocytes Relative: 12.7 %
NEUTROS ABS: 2881 {cells}/uL (ref 1500–7800)
Neutrophils Relative %: 61.3 %
Platelets: 186 10*3/uL (ref 140–400)
RBC: 4.03 10*6/uL — AB (ref 4.20–5.80)
RDW: 13 % (ref 11.0–15.0)
Total Lymphocyte: 21 %
WBC: 4.7 10*3/uL (ref 3.8–10.8)
WBCMIX: 597 {cells}/uL (ref 200–950)

## 2017-01-04 LAB — RETICULOCYTES
ABS Retic: 43670 cells/uL (ref 25000–9000)
Retic Ct Pct: 1.1 %

## 2017-01-04 NOTE — Addendum Note (Signed)
Addended by: Chancy Hurter on: 01/04/2017 09:35 AM   Modules accepted: Orders

## 2017-01-04 NOTE — Progress Notes (Signed)
Patient presents to the office to have labs drawn. No changes with medications.

## 2017-01-05 ENCOUNTER — Other Ambulatory Visit: Payer: Self-pay

## 2017-01-05 ENCOUNTER — Other Ambulatory Visit: Payer: Self-pay | Admitting: Adult Health

## 2017-01-05 DIAGNOSIS — D649 Anemia, unspecified: Secondary | ICD-10-CM

## 2017-01-05 DIAGNOSIS — Z1211 Encounter for screening for malignant neoplasm of colon: Secondary | ICD-10-CM

## 2017-01-05 LAB — POC HEMOCCULT BLD/STL (HOME/3-CARD/SCREEN): Fecal Occult Blood, POC: NEGATIVE

## 2017-01-05 NOTE — Progress Notes (Signed)
New onset anemia noted - 11/5 hgb 13.0 -  2 week follow up shows continued decline hgb of 12.4  - iron/TIBC/ferritin normal - retic response appropriate - neg hemoccult. Discussed with Dr. Melford Aase and will refer to hematology for further eval.

## 2017-01-06 ENCOUNTER — Encounter: Payer: Self-pay | Admitting: Internal Medicine

## 2017-01-11 ENCOUNTER — Encounter (INDEPENDENT_AMBULATORY_CARE_PROVIDER_SITE_OTHER): Payer: Self-pay

## 2017-01-18 ENCOUNTER — Ambulatory Visit (INDEPENDENT_AMBULATORY_CARE_PROVIDER_SITE_OTHER): Payer: Medicare Other

## 2017-01-18 ENCOUNTER — Other Ambulatory Visit: Payer: Self-pay | Admitting: Internal Medicine

## 2017-01-18 DIAGNOSIS — D649 Anemia, unspecified: Secondary | ICD-10-CM

## 2017-01-18 LAB — CBC WITH DIFFERENTIAL/PLATELET
BASOS ABS: 38 {cells}/uL (ref 0–200)
Basophils Relative: 0.7 %
EOS PCT: 2.4 %
Eosinophils Absolute: 130 cells/uL (ref 15–500)
HCT: 36.6 % — ABNORMAL LOW (ref 38.5–50.0)
Hemoglobin: 12.4 g/dL — ABNORMAL LOW (ref 13.2–17.1)
Lymphs Abs: 1183 cells/uL (ref 850–3900)
MCH: 30.8 pg (ref 27.0–33.0)
MCHC: 33.9 g/dL (ref 32.0–36.0)
MCV: 90.8 fL (ref 80.0–100.0)
MPV: 10 fL (ref 7.5–12.5)
Monocytes Relative: 11.5 %
NEUTROS PCT: 63.5 %
Neutro Abs: 3429 cells/uL (ref 1500–7800)
PLATELETS: 200 10*3/uL (ref 140–400)
RBC: 4.03 10*6/uL — AB (ref 4.20–5.80)
RDW: 13 % (ref 11.0–15.0)
TOTAL LYMPHOCYTE: 21.9 %
WBC: 5.4 10*3/uL (ref 3.8–10.8)
WBCMIX: 621 {cells}/uL (ref 200–950)

## 2017-01-18 NOTE — Progress Notes (Signed)
Pt presents for CBC w/diff. Labs have already been entered by [facility].

## 2017-01-20 ENCOUNTER — Encounter: Payer: Self-pay | Admitting: Internal Medicine

## 2017-01-21 ENCOUNTER — Ambulatory Visit: Payer: Self-pay | Admitting: Internal Medicine

## 2017-01-21 ENCOUNTER — Other Ambulatory Visit: Payer: Self-pay | Admitting: Internal Medicine

## 2017-01-21 ENCOUNTER — Other Ambulatory Visit: Payer: Self-pay

## 2017-01-29 ENCOUNTER — Telehealth: Payer: Self-pay | Admitting: Hematology

## 2017-01-29 NOTE — Telephone Encounter (Signed)
Spoke with patient and he sated he is [place] till end of Jan 2019.  He would like to wait to schedule appointment until after he sees his PCP on 03/17/17.

## 2017-02-03 DIAGNOSIS — C61 Malignant neoplasm of prostate: Secondary | ICD-10-CM | POA: Diagnosis not present

## 2017-02-17 ENCOUNTER — Encounter: Payer: Medicare Other | Admitting: Internal Medicine

## 2017-02-25 ENCOUNTER — Encounter: Payer: Self-pay | Admitting: Internal Medicine

## 2017-02-25 ENCOUNTER — Encounter: Payer: Medicare Other | Admitting: Hematology

## 2017-03-03 ENCOUNTER — Encounter: Payer: Self-pay | Admitting: Internal Medicine

## 2017-03-15 ENCOUNTER — Encounter: Payer: Medicare Other | Admitting: Hematology

## 2017-03-17 ENCOUNTER — Ambulatory Visit (INDEPENDENT_AMBULATORY_CARE_PROVIDER_SITE_OTHER): Payer: Medicare Other | Admitting: Internal Medicine

## 2017-03-17 ENCOUNTER — Encounter: Payer: Self-pay | Admitting: Internal Medicine

## 2017-03-17 VITALS — BP 124/68 | HR 64 | Temp 97.5°F | Resp 16 | Ht 74.25 in | Wt 215.2 lb

## 2017-03-17 DIAGNOSIS — I10 Essential (primary) hypertension: Secondary | ICD-10-CM | POA: Diagnosis not present

## 2017-03-17 DIAGNOSIS — R7309 Other abnormal glucose: Secondary | ICD-10-CM | POA: Diagnosis not present

## 2017-03-17 DIAGNOSIS — R972 Elevated prostate specific antigen [PSA]: Secondary | ICD-10-CM | POA: Diagnosis not present

## 2017-03-17 DIAGNOSIS — E782 Mixed hyperlipidemia: Secondary | ICD-10-CM | POA: Diagnosis not present

## 2017-03-17 DIAGNOSIS — R7303 Prediabetes: Secondary | ICD-10-CM | POA: Diagnosis not present

## 2017-03-17 DIAGNOSIS — K582 Mixed irritable bowel syndrome: Secondary | ICD-10-CM | POA: Diagnosis not present

## 2017-03-17 DIAGNOSIS — R0989 Other specified symptoms and signs involving the circulatory and respiratory systems: Secondary | ICD-10-CM

## 2017-03-17 DIAGNOSIS — Z1212 Encounter for screening for malignant neoplasm of rectum: Secondary | ICD-10-CM

## 2017-03-17 DIAGNOSIS — Z79899 Other long term (current) drug therapy: Secondary | ICD-10-CM

## 2017-03-17 DIAGNOSIS — E559 Vitamin D deficiency, unspecified: Secondary | ICD-10-CM

## 2017-03-17 DIAGNOSIS — C61 Malignant neoplasm of prostate: Secondary | ICD-10-CM | POA: Diagnosis not present

## 2017-03-17 DIAGNOSIS — Z136 Encounter for screening for cardiovascular disorders: Secondary | ICD-10-CM | POA: Diagnosis not present

## 2017-03-17 DIAGNOSIS — Z1211 Encounter for screening for malignant neoplasm of colon: Secondary | ICD-10-CM

## 2017-03-17 MED ORDER — DICYCLOMINE HCL 20 MG PO TABS: ORAL_TABLET | ORAL | 3 refills | Status: DC

## 2017-03-17 NOTE — Progress Notes (Signed)
[facility]   [name], M.D.     Uvaldo Bristle. Silverio Lay, P.A.-C Liane Comber, [place]                [address] [place], [place] [address] Telephone [phone] Telefax [phone] Comprehensive Evaluation & Examination     This very nice 84 y.o. WWM (Swed[place]) presents for a comprehensive evaluation and management of multiple medical co-morbidities.  Patient has been followed for HTN, Prediabetes, Hyperlipidemia and Vitamin D Deficiency.  Patient is followed closely by Dr Gaynelle Arabian since 2007  and more recently by Dr Gerald Stabs Lovena Neighbours for Gleason 6 Ca of Prostate on Proscar and by Active Surveillance.      Also, still having issues wit alternating Diarrhea & constipation. Last Cilon 2003 by Dr Daine Gravel.      HTN predates circa 2001. Patient's BP has been controlled at home.  Today's BP is at goal - 124/68.  Negative Cardiolite in 2011. Patient denies any cardiac symptoms as chest pain, palpitations, shortness of breath, dizziness or ankle swelling.     Patient's hyperlipidemia has been controlled in the past with diet. Last lipids were not at goal: Lab Results  Component Value Date   CHOL 198 12/21/2016   HDL 61 12/21/2016   LDLCALC 111 (H) 06/25/2016   TRIG 158 (H) 12/21/2016   CHOLHDL 3.2 12/21/2016      Patient has prediabetes (A21c 6.1%/2014) and patient denies reactive hypoglycemic symptoms, visual blurring, diabetic polys or paresthesias. Last A1c was Normal & at goal: Lab Results  Component Value Date   HGBA1C 5.1 12/21/2016       Finally, patient has history of Vitamin D Deficiency ("42" on treatment in 2009)  and last vitamin D was at goal: Lab Results  Component Value Date   VD25OH 61 06/25/2016   Current Outpatient Medications on File Prior to Visit  Medication Sig  . aspirin EC 81 MG tablet Take 81 mg by mouth daily.  . bisoprolol (ZEBETA) 5 MG tablet Take 1/2 to 1 tablet  daily for BP and Pulse Rate  . Cholecalciferol (VITAMIN D-3) 5000 UNITS TABS Take by mouth.  . doxazosin (CARDURA) 8 MG tablet Take 1/2 to 1 tablet at Bedtime for Prostate & BP  . finasteride (PROSCAR) 5 MG tablet Take 1 tablet (5 mg total) by mouth daily.  . hyoscyamine (LEVSIN SL) 0.125 MG SL tablet Take 1 to 2 tablets 4 x / day if needed for abdominal cramping, nausea or Diarrhea  . Krill Oil 1000 MG CAPS Take 1,000 mg by mouth daily.  . LUTEIN PO Take 5 mg by mouth 2 (two) times daily.  Marland Kitchen neomycin-polymyxin-hydrocortisone (CORTISPORIN) 3.5-10000-1 OTIC suspension Place 4 drops 4 (four) times daily into both ears.  . tadalafil (CIALIS) 10 MG tablet 1/2-1 pill daily as directed.   No current facility-administered medications on file prior to visit.    Allergies  Allergen Reactions  . Adhesive [Tape]   . Celebrex [Celecoxib] Other (See Comments)    GI upset  . Lipitor [Atorvastatin]   . Nickel   . Ppd [Tuberculin Purified Protein Derivative] Other (See Comments)    Positive PPD   Past Medical History:  Diagnosis Date  . Allergic rhinitis   . Cancer [facility])    prostate  . DJD (degenerative joint disease)   . Hypertension    Borderline   .  Sinus bradycardia   . Vitamin D deficiency    Health Maintenance  Topic Date Due  . TETANUS/TDAP  02/16/2018  . INFLUENZA VACCINE  Completed  . PNA vac Low Risk Adult  Completed   Immunization History  Administered Date(s) Administered  . H1N1 02/17/2007  . Influenza Whole 12/05/2012  . Influenza, High Dose Seasonal PF 11/14/2013, 11/20/2014, 12/16/2015, 10/30/2016  . Influenza-Unspecified 12/05/2012  . Pneumococcal Conjugate-13 12/05/2014  . Pneumococcal Polysaccharide-23 03/08/2008, 05/18/2013  . Tdap 02/17/2008  . Zoster 02/16/2005   Last Colon - 2003 - Dr Daine Gravel  Past Surgical History:  Procedure Laterality Date  . APPENDECTOMY    . cervical neck mass   07/08/06   Left Excision left posterior cervical neck mass (2.5 cm).  SURGEON:  Earnstine Regal, M.D., Allegra Grana 07/08/06 and 12/05/07  . FLEXIBLE SIGMOIDOSCOPY    . HEMORRHOID SURGERY    . JOINT REPLACEMENT     Left knee 2013  . KNEE SURGERY    . MASTOIDECTOMY Left    Family History  Problem Relation Age of Onset  . Stroke Mother   . Diabetes Mother   . Heart attack Father 71  . Hypertension Father   . Diabetes Maternal Aunt   . Diabetes Paternal Aunt   . Cancer Maternal Grandmother    Social History   Socioeconomic History  . Marital status: Widowed    Spouse name: Has nurse girlfriend in [place]  . Number of children: 2 daughters & 2 GC  Occupational History  . Occupation: Archivist - [employer]   Tobacco Use  . Smoking status: Never Smoker  . Smokeless tobacco: Never Used  Substance and Sexual Activity  . Alcohol use: Yes    Alcohol/week: 6.0 oz    Types: 10 Glasses of wine per week  . Drug use: No  . Sexual activity: Active    ROS Constitutional: Denies fever, chills, weight loss/gain, headaches, insomnia,  night sweats or change in appetite. Does c/o fatigue. Eyes: Denies redness, blurred vision, diplopia, discharge, itchy or watery eyes.  ENT: Denies discharge, congestion, post nasal drip, epistaxis, sore throat, earache, hearing loss, dental pain, Tinnitus, Vertigo, Sinus pain or snoring.  Cardio: Denies chest pain, palpitations, irregular heartbeat, syncope, dyspnea, diaphoresis, orthopnea, PND, claudication or edema Respiratory: denies cough, dyspnea, DOE, pleurisy, hoarseness, laryngitis or wheezing.  Gastrointestinal: Denies dysphagia, heartburn, reflux, water brash, pain, cramps, nausea, vomiting, bloating, diarrhea, constipation, hematemesis, melena, hematochezia, jaundice or hemorrhoids Genitourinary: Denies dysuria, frequency, urgency, nocturia, hesitancy, discharge, hematuria or flank pain Musculoskeletal: Denies arthralgia, myalgia, stiffness, Jt. Swelling, pain, limp or strain/sprain. Denies Falls. Skin:  Denies puritis, rash, hives, warts, acne, eczema or change in skin lesion Neuro: No weakness, tremor, incoordination, spasms, paresthesia or pain Psychiatric: Denies confusion, memory loss or sensory loss. Denies Depression. Endocrine: Denies change in weight, skin, hair change, nocturia, and paresthesia, diabetic polys, visual blurring or hyper / hypo glycemic episodes.  Heme/Lymph: No excessive bleeding, bruising or enlarged lymph nodes.  Physical Exam  BP 124/68   Pulse 64   Temp (!) 97.5 F (36.4 C)   Resp 16   Ht 6' 2.25" (1.886 m)   Wt 215 lb 3.2 oz (97.6 kg)   BMI 27.44 kg/m   General Appearance: Well nourished and well groomed and in no apparent distress.  Eyes: PERRLA, EOMs, conjunctiva no swelling or erythema, normal fundi and vessels. Sinuses: No frontal/maxillary tenderness ENT/Mouth: EACs patent / TMs  nl. Nares clear without  erythema, swelling, mucoid exudates. Oral hygiene is good. No erythema, swelling, or exudate. Tongue normal, non-obstructing. Tonsils not swollen or erythematous. Hearing normal.  Neck: Supple, thyroid normal. No bruits, nodes or JVD. Respiratory: Respiratory effort normal.  BS equal and clear bilateral without rales, rhonci, wheezing or stridor. Cardio: Heart sounds are normal with regular rate and rhythm and no murmurs, rubs or gallops. Peripheral pulses are normal and equal bilaterally without edema. No aortic or femoral bruits. Chest: symmetric with normal excursions and percussion.  Abdomen: Soft, with Nl bowel sounds. Nontender, no guarding, rebound, hernias, masses, or organomegaly.  Lymphatics: Non tender without lymphadenopathy.  Genitourinary: Deferred to Dr Lovena Neighbours Musculoskeletal: Full ROM all peripheral extremities, joint stability, 5/5 strength, and normal gait. Skin: Warm and dry without rashes, lesions, cyanosis, clubbing or  ecchymosis.  Neuro: Cranial nerves intact, reflexes equal bilaterally. Normal muscle tone, no cerebellar  symptoms. Sensation intact.  Pysch: Alert and oriented X 3 with normal affect, insight and judgment appropriate.   Assessment and Plan  1. Labile hypertension  - EKG 12-Lead - [place], RETROPERITNL ABD,  LTD - Urinalysis, Routine w reflex microscopic - Microalbumin / creatinine urine ratio - CBC with Differential/Platelet - BASIC METABOLIC PANEL WITH GFR - Magnesium - TSH  2. Hyperlipidemia, mixed  - EKG 12-Lead - [place], RETROPERITNL ABD,  LTD - Hepatic function panel - Lipid panel - TSH  3. Prediabetes  - EKG 12-Lead - [place], RETROPERITNL ABD,  LTD - Hemoglobin A1c - Insulin, random  4. Vitamin D deficiency  - EKG 12-Lead - [place], RETROPERITNL ABD,  LTD - VITAMIN D 25 Hydroxy  5. Other abnormal glucose  - Hemoglobin A1c - Insulin, random  6. Screening for ischemic heart disease  - EKG 12-Lead  7. Screening for AAA (aortic abdominal aneurysm)  - [place], RETROPERITNL ABD,  LTD  8. Prostate cancer ([place])  - PSA  9. Elevated prostate specific antigen (PSA)  - PSA  10. Screening for colorectal cancer  - POC Hemoccult Bld/Stl   11. Medication management  - Urinalysis, Routine w reflex microscopic - Microalbumin / creatinine urine ratio - CBC with Differential/Platelet - BASIC METABOLIC PANEL WITH GFR - Hepatic function panel - Magnesium - Lipid panel - TSH - Hemoglobin A1c - Insulin, random - VITAMIN D 25 Hydroxy  12. Irritable bowel syndrome with both constipation and diarrhea  - dicyclomine (BENTYL) 20 MG tablet; Take 1 tablet 3 x/ day before meals for bloating, cramping, or diarrhea  Dispense: 90 tablet; Refill: 3      Patient was counseled in prudent diet, weight control to achieve/maintain BMI less than 25, BP monitoring, regular exercise and medications as discussed.  Discussed med effects and SE's. Routine screening labs and tests as requested with regular follow-up as recommended. Over 40 minutes of exam, counseling, chart review and high complex  critical decision making was performed

## 2017-03-17 NOTE — Patient Instructions (Addendum)
Probiotics What are probiotics? Probiotics are the good bacteria and yeasts that live in your body and keep you and your digestive system healthy. Probiotics also help your body's defense (immune) system and protect your body against bad bacterial growth. Certain foods contain probiotics, such as yogurt. Probiotics can also be purchased as a supplement. As with any supplement or drug, it is important to discuss its use with your health care provider. What affects the balance of bacteria in my body? The balance of bacteria in your body can be affected by:  Antibiotic medicines. Antibiotics are sometimes necessary to treat infection. Unfortunately, they may kill good or friendly bacteria in your body as well as the bad bacteria. This may lead to stomach problems like diarrhea, gas, and cramping.  Disease. Some conditions are the result of an overgrowth of bad bacteria, yeasts, parasites, or fungi. These conditions include: ? Infectious diarrhea. ? Stomach and respiratory infections. ? Skin infections. ? Irritable bowel syndrome (IBS). ? Inflammatory bowel diseases. ? Ulcer due to Helicobacter pylori (H. pylori) infection. ? Tooth decay and periodontal disease. ? Vaginal infections.  Stress and poor diet may also lower the good bacteria in your body. What type of probiotic is right for me? Probiotics are available over the counter at your local pharmacy, health food, or grocery store. They come in many different forms, combinations of strains, and dosing strengths. Some may need to be refrigerated. Always read the label for storage and usage instructions. Specific strains have been shown to be more effective for certain conditions. Ask your health care provider what option is best for you. Why would I need probiotics? There are many reasons your health care provider might recommend a probiotic supplement, including:  Diarrhea.  Constipation.  IBS.  Respiratory infections.  Yeast  infections.  Acne, eczema, and other skin conditions.  Frequent urinary tract infections (UTIs).  Are there side effects of probiotics? Some people experience mild side effects when taking probiotics. Side effects are usually temporary and may include:  Gas.  Bloating.  Cramping.  Rarely, serious side effects, such as infection or immune system changes, may occur. What else do I need to know about probiotics?  There are many different strains of probiotics. Certain strains may be more effective depending on your condition. Probiotics are available in varying doses. Ask your health care provider which probiotic you should use and how often.  If you are taking probiotics along with antibiotics, it is generally recommended to wait at least 2 hours between taking the antibiotic and taking the probiotic. For more information: [facility] for Complementary and Alternative Medicine [employer].com.cy This information is not intended to replace advice given to you by your health care provider. Make sure you discuss any questions you have with your health care provider. Document Released: 08/30/2013 Document Revised: 12/31/2015 Document Reviewed: 05/02/2013 Elsevier Interactive Patient Education  2017 [place].  +++++++++++++++++++++++++++ Diet for Irritable Bowel Syndrome When you have irritable bowel syndrome (IBS), the foods you eat and your eating habits are very important. IBS may cause various symptoms, such as abdominal pain, constipation, or diarrhea. Choosing the right foods can help ease discomfort caused by these symptoms. Work with your health care provider and dietitian to find the best eating plan to help control your symptoms. What general guidelines do I need to follow?  Keep a food diary. This will help you identify foods that cause symptoms. Write down: ? What you eat and when. ? What symptoms you have. ? When  symptoms occur in relation to your meals.  Avoid  foods that cause symptoms. Talk with your dietitian about other ways to get the same nutrients that are in these foods.  Eat more foods that contain fiber. Take a fiber supplement if directed by your dietitian.  Eat your meals slowly, in a relaxed setting.  Aim to eat 5-6 small meals per day. Do not skip meals.  Drink enough fluids to keep your urine clear or pale yellow.  Ask your health care provider if you should take an over-the-counter probiotic during flare-ups to help restore healthy gut bacteria.  If you have cramping or diarrhea, try making your meals low in fat and high in carbohydrates. Examples of carbohydrates are pasta, rice, whole grain breads and cereals, fruits, and vegetables.  If dairy products cause your symptoms to flare up, try eating less of them. You might be able to handle yogurt better than other dairy products because it contains bacteria that help with digestion. What foods are not recommended? The following are some foods and drinks that may worsen your symptoms:  Fatty foods, such as [place] fries.  Milk products, such as cheese or ice cream.  Chocolate.  Alcohol.  Products with caffeine, such as coffee.  Carbonated drinks, such as soda.  The items listed above may not be a complete list of foods and beverages to avoid. Contact your dietitian for more information. What foods are good sources of fiber? Your health care provider or dietitian may recommend that you eat more foods that contain fiber. Fiber can help reduce constipation and other IBS symptoms. Add foods with fiber to your diet a little at a time so that your body can get used to them. Too much fiber at once might cause gas and swelling of your abdomen. The following are some foods that are good sources of fiber:  Apples.  Peaches.  Pears.  Berries.  Figs.  Broccoli (raw).  Cabbage.  Carrots.  Raw peas.  Kidney beans.  Lima beans.  Whole grain bread.  Whole grain  cereal.  Where to find more information: [employer] for Functional Gastrointestinal Disorders: www.iffgd.[employer] of Diabetes and Digestive and Kidney Diseases: NetworkAffair.co.za.aspx This information is not intended to replace advice given to you by your health care provider. Make sure you discuss any questions you have with your health care provider. Document Released: 04/25/2003 Document Revised: 07/11/2015 Document Reviewed: 05/05/2013 Elsevier Interactive Patient Education  2018 [place]. +++++++++++++++++++++++++++ Irritable Bowel Syndrome, Adult Irritable bowel syndrome (IBS) is not one specific disease. It is a group of symptoms that affects the organs responsible for digestion (gastrointestinal or GI tract). To regulate how your GI tract works, your body sends signals back and forth between your intestines and your brain. If you have IBS, there may be a problem with these signals. As a result, your GI tract does not function normally. Your intestines may become more sensitive and overreact to certain things. This is especially true when you eat certain foods or when you are under stress. There are four types of IBS. These may be determined based on the consistency of your stool:  IBS with diarrhea.  IBS with constipation.  Mixed IBS.  Unsubtyped IBS.  It is important to know which type of IBS you have. Some treatments are more likely to be helpful for certain types of IBS. What are the causes? The exact cause of IBS is not known. What increases the risk? You may have a higher risk  of IBS if:  You are a woman.  You are younger than 82 years old.  You have a family history of IBS.  You have mental health problems.  You have had bacterial infection of your GI tract.  What are the signs or symptoms? Symptoms of IBS vary from person to person. The main symptom is abdominal  pain or discomfort. Additional symptoms usually include one or more of the following:  Diarrhea, constipation, or both.  Abdominal swelling or bloating.  Feeling full or sick after eating a small or regular-size meal.  Frequent gas.  Mucus in the stool.  A feeling of having more stool left after a bowel movement.  Symptoms tend to come and go. They may be associated with stress, psychiatric conditions, or nothing at all. How is this diagnosed? There is no specific test to diagnose IBS. Your health care provider will make a diagnosis based on a physical exam, medical history, and your symptoms. You may have other tests to rule out other conditions that may be causing your symptoms. These may include:  Blood tests.  X-rays.  CT scan.  Endoscopy and colonoscopy. This is a test in which your GI tract is viewed with a long, thin, flexible tube.  How is this treated? There is no cure for IBS, but treatment can help relieve symptoms. IBS treatment often includes:  Changes to your diet, such as: ? Eating more fiber. ? Avoiding foods that cause symptoms. ? Drinking more water. ? Eating regular, medium-sized portioned meals.  Medicines. These may include: ? Fiber supplements if you have constipation. ? Medicine to control diarrhea (antidiarrheal medicines). ? Medicine to help control muscle spasms in your GI tract (antispasmodic medicines). ? Medicines to help with any mental health issues, such as antidepressants or tranquilizers.  Therapy. ? Talk therapy may help with anxiety, depression, or other mental health issues that can make IBS symptoms worse.  Stress reduction. ? Managing your stress can help keep symptoms under control.  Follow these instructions at home:  Take medicines only as directed by your health care provider.  Eat a healthy diet. ? Avoid foods and drinks with added sugar. ? Include more whole grains, fruits, and vegetables gradually into your diet. This  may be especially helpful if you have IBS with constipation. ? Avoid any foods and drinks that make your symptoms worse. These may include dairy products and caffeinated or carbonated drinks. ? Do not eat large meals. ? Drink enough fluid to keep your urine clear or pale yellow.  Exercise regularly. Ask your health care provider for recommendations of good activities for you.  Keep all follow-up visits as directed by your health care provider. This is important. Contact a health care provider if:  You have constant pain.  You have trouble or pain with swallowing.  You have worsening diarrhea. Get help right away if:  You have severe and worsening abdominal pain.  You have diarrhea and: ? You have a rash, stiff neck, or severe headache. ? You are irritable, sleepy, or difficult to awaken. ? You are weak, dizzy, or extremely thirsty.  You have bright red blood in your stool or you have black tarry stools.  You have unusual abdominal swelling that is painful.  You vomit continuously.  You vomit blood (hematemesis).  You have both abdominal pain and a fever. This information is not intended to replace advice given to you by your health care provider. Make sure you discuss any questions you have with  your health care provider. Document Released: 02/02/2005 Document Revised: 07/05/2015 Document Reviewed: 10/20/2013 Elsevier Interactive Patient Education  2018 [place].  +++++++++++++++++++++++++++ Preventive Care for Adults  A healthy lifestyle and preventive care can promote health and wellness. Preventive health guidelines for men include the following key practices:  A routine yearly physical is a good way to check with your health care provider about your health and preventative screening. It is a chance to share any concerns and updates on your health and to receive a thorough exam.  Visit your dentist for a routine exam and preventative care every 6 months. Brush your  teeth twice a day and floss once a day. Good oral hygiene prevents tooth decay and gum disease.  The frequency of eye exams is based on your age, health, family medical history, use of contact lenses, and other factors. Follow your health care provider's recommendations for frequency of eye exams.  Eat a healthy diet. Foods such as vegetables, fruits, whole grains, low-fat dairy products, and lean protein foods contain the nutrients you need without too many calories. Decrease your intake of foods high in solid fats, added sugars, and salt. Eat the right amount of calories for you.Get information about a proper diet from your health care provider, if necessary.  Regular physical exercise is one of the most important things you can do for your health. Most adults should get at least 150 minutes of moderate-intensity exercise (any activity that increases your heart rate and causes you to sweat) each week. In addition, most adults need muscle-strengthening exercises on 2 or more days a week.  Maintain a healthy weight. The body mass index (BMI) is a screening tool to identify possible weight problems. It provides an estimate of body fat based on height and weight. Your health care provider can find your BMI and can help you achieve or maintain a healthy weight.For adults 20 years and older:  A BMI below 18.5 is considered underweight.  A BMI of 18.5 to 24.9 is normal.  A BMI of 25 to 29.9 is considered overweight.  A BMI of 30 and above is considered obese.  Maintain normal blood lipids and cholesterol levels by exercising and minimizing your intake of saturated fat. Eat a balanced diet with plenty of fruit and vegetables. Blood tests for lipids and cholesterol should begin at age 55 and be repeated every 5 years. If your lipid or cholesterol levels are high, you are over 50, or you are at high risk for heart disease, you may need your cholesterol levels checked more frequently.Ongoing high lipid  and cholesterol levels should be treated with medicines if diet and exercise are not working.  If you smoke, find out from your health care provider how to quit. If you do not use tobacco, do not start.  Lung cancer screening is recommended for adults aged 85-80 years who are at high risk for developing lung cancer because of a history of smoking. A yearly low-dose CT scan of the lungs is recommended for people who have at least a 30-pack-year history of smoking and are a current smoker or have quit within the past 15 years. A pack year of smoking is smoking an average of 1 pack of cigarettes a day for 1 year (for example: 1 pack a day for 30 years or 2 packs a day for 15 years). Yearly screening should continue until the smoker has stopped smoking for at least 15 years. Yearly screening should be stopped for people who  develop a health problem that would prevent them from having lung cancer treatment.  If you choose to drink alcohol, do not have more than 2 drinks per day. One drink is considered to be 12 ounces (355 mL) of beer, 5 ounces (148 mL) of wine, or 1.5 ounces (44 mL) of liquor.  Avoid use of street drugs. Do not share needles with anyone. Ask for help if you need support or instructions about stopping the use of drugs.  High blood pressure causes heart disease and increases the risk of stroke. Your blood pressure should be checked at least every 1-2 years. Ongoing high blood pressure should be treated with medicines, if weight loss and exercise are not effective.  If you are 1-22 years old, ask your health care provider if you should take aspirin to prevent heart disease.  Diabetes screening involves taking a blood sample to check your fasting blood sugar level. Testing should be considered at a younger age or be carried out more frequently if you are overweight and have at least 1 risk factor for diabetes.  Colorectal cancer can be detected and often prevented. Most routine colorectal  cancer screening begins at the age of 69 and continues through age 42. However, your health care provider may recommend screening at an earlier age if you have risk factors for colon cancer. On a yearly basis, your health care provider may provide home test kits to check for hidden blood in the stool. Use of a small camera at the end of a tube to directly examine the colon (sigmoidoscopy or colonoscopy) can detect the earliest forms of colorectal cancer. Talk to your health care provider about this at age 55, when routine screening begins. Direct exam of the colon should be repeated every 5-10 years through age 44, unless early forms of precancerous polyps or small growths are found.  Hepatitis C blood testing is recommended for all people born from [birth year] through 1965 and any individual with known risks for hepatitis C.  Screening for abdominal aortic aneurysm (AAA)  by ultrasound is recommended for people who have history of high blood pressure or who are current or former smokers.  Healthy men should  receive prostate-specific antigen (PSA) blood tests as part of routine cancer screening. Talk with your health care provider about prostate cancer screening.  Testicular cancer screening is  recommended for adult males. Screening includes self-exam, a health care provider exam, and other screening tests. Consult with your health care provider about any symptoms you have or any concerns you have about testicular cancer.  Use sunscreen. Apply sunscreen liberally and repeatedly throughout the day. You should seek shade when your shadow is shorter than you. Protect yourself by wearing long sleeves, pants, a wide-brimmed hat, and sunglasses year round, whenever you are outdoors.  Once a month, do a whole-body skin exam, using a mirror to look at the skin on your back. Tell your health care provider about new moles, moles that have irregular borders, moles that are larger than a pencil eraser, or moles that have  changed in shape or color.  Stay current with required vaccines (immunizations).  Influenza vaccine. All adults should be immunized every year.  Tetanus, diphtheria, and acellular pertussis (Td, Tdap) vaccine. An adult who has not previously received Tdap or who does not know his vaccine status should receive 1 dose of Tdap. This initial dose should be followed by tetanus and diphtheria toxoids (Td) booster doses every 10 years. Adults with an unknown or  incomplete history of completing a 3-dose immunization series with Td-containing vaccines should begin or complete a primary immunization series including a Tdap dose. Adults should receive a Td booster every 10 years.  Zoster vaccine. One dose is recommended for adults aged 28 years or older unless certain conditions are present.    PREVNAR - Pneumococcal 13-valent conjugate (PCV13) vaccine. When indicated, a person who is uncertain of his immunization history and has no record of immunization should receive the PCV13 vaccine. An adult aged 91 years or older who has certain medical conditions and has not been previously immunized should receive 1 dose of PCV13 vaccine. This PCV13 should be followed with a dose of pneumococcal polysaccharide (PPSV23) vaccine. The PPSV23 vaccine dose should be obtained at least 8 weeks after the dose of PCV13 vaccine. An adult aged 3 years or older who has certain medical conditions and previously received 1 or more doses of PPSV23 vaccine should receive 1 dose of PCV13. The PCV13 vaccine dose should be obtained 1 or more years after the last PPSV23 vaccine dose.    PNEUMOVAX - Pneumococcal polysaccharide (PPSV23) vaccine. When PCV13 is also indicated, PCV13 should be obtained first. All adults aged 30 years and older should be immunized. An adult younger than age 50 years who has certain medical conditions should be immunized. Any person who resides in a nursing home or long-term care facility should be immunized.  An adult smoker should be immunized. People with an immunocompromised condition and certain other conditions should receive both PCV13 and PPSV23 vaccines. People with human immunodeficiency virus (HIV) infection should be immunized as soon as possible after diagnosis. Immunization during chemotherapy or radiation therapy should be avoided. Routine use of PPSV23 vaccine is not recommended for American Indians, [place] Natives, or people younger than 65 years unless there are medical conditions that require PPSV23 vaccine. When indicated, people who have unknown immunization and have no record of immunization should receive PPSV23 vaccine. One-time revaccination 5 years after the first dose of PPSV23 is recommended for people aged 19-64 years who have chronic kidney failure, nephrotic syndrome, asplenia, or immunocompromised conditions. People who received 1-2 doses of PPSV23 before age 27 years should receive another dose of PPSV23 vaccine at age 69 years or later if at least 5 years have passed since the previous dose. Doses of PPSV23 are not needed for people immunized with PPSV23 at or after age 4 years.    Hepatitis A vaccine. Adults who wish to be protected from this disease, have certain high-risk conditions, work with hepatitis A-infected animals, work in hepatitis A research labs, or travel to or work in countries with a high rate of hepatitis A should be immunized. Adults who were previously unvaccinated and who anticipate close contact with an international adoptee during the first 60 days after arrival in the [place] from a country with a high rate of hepatitis A should be immunized.    Hepatitis B vaccine. Adults should be immunized if they wish to be protected from this disease, have certain high-risk conditions, may be exposed to blood or other infectious body fluids, are household contacts or sex partners of hepatitis B positive people, are clients or workers in certain care facilities,  or travel to or work in countries with a high rate of hepatitis B.   Preventive Service / Frequency   Ages 70 and over  Blood pressure check.  Lipid and cholesterol check.  Lung cancer screening. / Every year if you are aged 82-80  years and have a 30-pack-year history of smoking and currently smoke or have quit within the past 15 years. Yearly screening is stopped once you have quit smoking for at least 15 years or develop a health problem that would prevent you from having lung cancer treatment.  Fecal occult blood test (FOBT) of stool. You may not have to do this test if you get a colonoscopy every 10 years.  Flexible sigmoidoscopy** or colonoscopy.** / Every 5 years for a flexible sigmoidoscopy or every 10 years for a colonoscopy beginning at age 36 and continuing until age 63.  Hepatitis C blood test.** / For all people born from [birth year] through 1963 and any individual with known risks for hepatitis C.  Abdominal aortic aneurysm (AAA) screening./ Screening current or former smokers or have Hypertension.  Skin self-exam. / Monthly.  Influenza vaccine. / Every year.  Tetanus, diphtheria, and acellular pertussis (Tdap/Td) vaccine.** / 1 dose of Td every 10 years.   Zoster vaccine.** / 1 dose for adults aged 47 years or older.         Pneumococcal 13-valent conjugate (PCV13) vaccine.    Pneumococcal polysaccharide (PPSV23) vaccine.     Hepatitis A vaccine.** / Consult your health care provider.  Hepatitis B vaccine.** / Consult your health care provider. Screening for abdominal aortic aneurysm (AAA)  by ultrasound is recommended for people who have history of high blood pressure or who are current or former smokers. ++++++++++ Recommend Adult Low Dose Aspirin or  coated  Aspirin 81 mg daily  To reduce risk of Colon Cancer 20 %,  Skin Cancer 26 % ,  Melanoma 46%  and  Pancreatic cancer 60% ++++++++++++++++++++++ Vitamin D goal  is between 70-100.  Please make sure  that you are taking your Vitamin D as directed.  It is very important as a natural anti-inflammatory  helping hair, skin, and nails, as well as reducing stroke and heart attack risk.  It helps your bones and helps with mood. It also decreases numerous cancer risks so please take it as directed.  Low Vit D is associated with a 200-300% higher risk for CANCER  and 200-300% higher risk for HEART   ATTACK  &  STROKE.   .....................................Marland Kitchen It is also associated with higher death rate at younger ages,  autoimmune diseases like Rheumatoid arthritis, Lupus, Multiple Sclerosis.    Also many other serious conditions, like depression, Alzheimer's Dementia, infertility, muscle aches, fatigue, fibromyalgia - just to name a few. ++++++++++++++++++++++ Recommend the book "The END of DIETING" by Dr Excell Seltzer  & the book "The END of DIABETES " by Dr Excell Seltzer At [email or web address] - get book & Audio CD's    Being diabetic has a  300% increased risk for heart attack, stroke, cancer, and alzheimer- type vascular dementia. It is very important that you work harder with diet by avoiding all foods that are white. Avoid white rice (brown & wild rice is OK), white potatoes (sweetpotatoes in moderation is OK), White bread or wheat bread or anything made out of white flour like bagels, donuts, rolls, buns, biscuits, cakes, pastries, cookies, pizza crust, and pasta (made from white flour & egg whites) - vegetarian pasta or spinach or wheat pasta is OK. Multigrain breads like Arnold's or [employer], or multigrain sandwich thins or flatbreads.  Diet, exercise and weight loss can reverse and cure diabetes in the early stages.  Diet, exercise and weight loss is very important in the control and prevention of complications of  diabetes which affects every system in your body, ie. Brain - dementia/stroke, eyes - glaucoma/blindness, heart - heart attack/heart failure, kidneys - dialysis, stomach - gastric  paralysis, intestines - malabsorption, nerves - severe painful neuritis, circulation - gangrene & loss of a leg(s), and finally cancer and Alzheimers.    I recommend avoid fried & greasy foods,  sweets/candy, white rice (brown or wild rice or Quinoa is OK), white potatoes (sweet potatoes are OK) - anything made from white flour - bagels, doughnuts, rolls, buns, biscuits,white and wheat breads, pizza crust and traditional pasta made of white flour & egg white(vegetarian pasta or spinach or wheat pasta is OK).  Multi-grain bread is OK - like multi-grain flat bread or sandwich thins. Avoid alcohol in excess. Exercise is also important.    Eat all the vegetables you want - avoid meat, especially red meat and dairy - especially cheese.  Cheese is the most concentrated form of trans-fats which is the worst thing to clog up our arteries. Veggie cheese is OK which can be found in the fresh produce section at [employer] or [employer] or [employer]  ++++++++++++++++++++++ DASH Eating Plan  DASH stands for "Dietary Approaches to Stop Hypertension."   The DASH eating plan is a healthy eating plan that has been shown to reduce high blood pressure (hypertension). Additional health benefits may include reducing the risk of type 2 diabetes mellitus, heart disease, and stroke. The DASH eating plan may also help with weight loss. WHAT DO I NEED TO KNOW ABOUT THE DASH EATING PLAN? For the DASH eating plan, you will follow these general guidelines:  Choose foods with a percent daily value for sodium of less than 5% (as listed on the food label).  Use salt-free seasonings or herbs instead of table salt or sea salt.  Check with your health care provider or pharmacist before using salt substitutes.  Eat lower-sodium products, often labeled as "lower sodium" or "no salt added."  Eat fresh foods.  Eat more vegetables, fruits, and low-fat dairy products.  Choose whole grains. Look for the word "whole" as the  first word in the ingredient list.  Choose fish   Limit sweets, desserts, sugars, and sugary drinks.  Choose heart-healthy fats.  Eat veggie cheese   Eat more home-cooked food and less restaurant, buffet, and fast food.  Limit fried foods.  Cook foods using methods other than frying.  Limit canned vegetables. If you do use them, rinse them well to decrease the sodium.  When eating at a restaurant, ask that your food be prepared with less salt, or no salt if possible.                      WHAT FOODS CAN I EAT? Read Dr Fara Olden Fuhrman's books on The End of Dieting & The End of Diabetes  Grains Whole grain or whole wheat bread. Brown rice. Whole grain or whole wheat pasta. Quinoa, bulgur, and whole grain cereals. Low-sodium cereals. Corn or whole wheat flour tortillas. Whole grain cornbread. Whole grain crackers. Low-sodium crackers.  Vegetables Fresh or frozen vegetables (raw, steamed, roasted, or grilled). Low-sodium or reduced-sodium tomato and vegetable juices. Low-sodium or reduced-sodium tomato sauce and paste. Low-sodium or reduced-sodium canned vegetables.   Fruits All fresh, canned (in natural juice), or frozen fruits.  Protein Products  All fish and seafood.  Dried beans, peas, or lentils. Unsalted nuts and seeds. Unsalted canned beans.  Dairy Low-fat dairy products, such as skim or 1% milk,  2% or reduced-fat cheeses, low-fat ricotta or cottage cheese, or plain low-fat yogurt. Low-sodium or reduced-sodium cheeses.  Fats and Oils Tub margarines without trans fats. Light or reduced-fat mayonnaise and salad dressings (reduced sodium). Avocado. Safflower, olive, or canola oils. Natural peanut or almond butter.  Other Unsalted popcorn and pretzels. The items listed above may not be a complete list of recommended foods or beverages. Contact your dietitian for more options.  ++++++++++++++++++++  WHAT FOODS ARE NOT RECOMMENDED? Grains/ White flour or wheat flour White  bread. White pasta. White rice. Refined cornbread. Bagels and croissants. Crackers that contain trans fat.  Vegetables  Creamed or fried vegetables. Vegetables in a . Regular canned vegetables. Regular canned tomato sauce and paste. Regular tomato and vegetable juices.  Fruits Dried fruits. Canned fruit in light or heavy syrup. Fruit juice.  Meat and Other Protein Products Meat in general - RED meat & White meat.  Fatty cuts of meat. Ribs, chicken wings, all processed meats as bacon, sausage, bologna, salami, fatback, hot dogs, bratwurst and packaged luncheon meats.  Dairy Whole or 2% milk, cream, half-and-half, and cream cheese. Whole-fat or sweetened yogurt. Full-fat cheeses or blue cheese. Non-dairy creamers and whipped toppings. Processed cheese, cheese spreads, or cheese curds.  Condiments Onion and garlic salt, seasoned salt, table salt, and sea salt. Canned and packaged gravies. Worcestershire sauce. Tartar sauce. Barbecue sauce. Teriyaki sauce. Soy sauce, including reduced sodium. Steak sauce. Fish sauce. Oyster sauce. Cocktail sauce. Horseradish. Ketchup and mustard. Meat flavorings and tenderizers. Bouillon cubes. Hot sauce. Tabasco sauce. Marinades. Taco seasonings. Relishes.  Fats and Oils Butter, stick margarine, lard, shortening and bacon fat. Coconut, palm kernel, or palm oils. Regular salad dressings.  Pickles and olives. Salted popcorn and pretzels.  The items listed above may not be a complete list of foods and beverages to avoid.

## 2017-03-18 LAB — HEMOGLOBIN A1C
HEMOGLOBIN A1C: 5.1 %{Hb} (ref <5.7)
MEAN PLASMA GLUCOSE: 100 (calc)
eAG (mmol/L): 5.5 (calc)

## 2017-03-18 LAB — CBC WITH DIFFERENTIAL/PLATELET
Basophils Absolute: 48 cells/uL (ref 0–200)
Basophils Relative: 0.7 %
EOS PCT: 2.7 %
Eosinophils Absolute: 184 cells/uL (ref 15–500)
HCT: 41.2 % (ref 38.5–50.0)
Hemoglobin: 13.9 g/dL (ref 13.2–17.1)
Lymphs Abs: 1285 cells/uL (ref 850–3900)
MCH: 30.2 pg (ref 27.0–33.0)
MCHC: 33.7 g/dL (ref 32.0–36.0)
MCV: 89.4 fL (ref 80.0–100.0)
MPV: 10 fL (ref 7.5–12.5)
Monocytes Relative: 10 %
Neutro Abs: 4604 cells/uL (ref 1500–7800)
Neutrophils Relative %: 67.7 %
PLATELETS: 203 10*3/uL (ref 140–400)
RBC: 4.61 10*6/uL (ref 4.20–5.80)
RDW: 12.5 % (ref 11.0–15.0)
TOTAL LYMPHOCYTE: 18.9 %
WBC: 6.8 10*3/uL (ref 3.8–10.8)
WBCMIX: 680 {cells}/uL (ref 200–950)

## 2017-03-18 LAB — URINALYSIS, ROUTINE W REFLEX MICROSCOPIC
BILIRUBIN URINE: NEGATIVE
Glucose, UA: NEGATIVE
Hgb urine dipstick: NEGATIVE
KETONES UR: NEGATIVE
Leukocytes, UA: NEGATIVE
NITRITE: NEGATIVE
Protein, ur: NEGATIVE
SPECIFIC GRAVITY, URINE: 1.012 (ref 1.001–1.03)

## 2017-03-18 LAB — BASIC METABOLIC PANEL WITH GFR
BUN/Creatinine Ratio: 18 (calc) (ref 6–22)
BUN: 28 mg/dL — AB (ref 7–25)
CALCIUM: 9.4 mg/dL (ref 8.6–10.3)
CHLORIDE: 104 mmol/L (ref 98–110)
CO2: 26 mmol/L (ref 20–32)
Creat: 1.58 mg/dL — ABNORMAL HIGH (ref 0.70–1.11)
GFR, Est African American: 45 mL/min/{1.73_m2} — ABNORMAL LOW (ref >=60)
GFR, Est Non African American: 39 mL/min/{1.73_m2} — ABNORMAL LOW (ref >=60)
Glucose, Bld: 90 mg/dL (ref 65–99)
Potassium: 4.7 mmol/L (ref 3.5–5.3)
Sodium: 138 mmol/L (ref 135–146)

## 2017-03-18 LAB — HEPATIC FUNCTION PANEL
AG Ratio: 1.7 (calc) (ref 1.0–2.5)
ALT: 17 U/L (ref 9–46)
AST: 18 U/L (ref 10–35)
Albumin: 4.3 g/dL (ref 3.6–5.1)
Alkaline phosphatase (APISO): 63 U/L (ref 40–115)
Bilirubin, Direct: 0.1 mg/dL (ref 0.0–0.2)
GLOBULIN: 2.5 g/dL (ref 1.9–3.7)
Indirect Bilirubin: 0.3 mg/dL (calc) (ref 0.2–1.2)
TOTAL PROTEIN: 6.8 g/dL (ref 6.1–8.1)
Total Bilirubin: 0.4 mg/dL (ref 0.2–1.2)

## 2017-03-18 LAB — MAGNESIUM: MAGNESIUM: 2.3 mg/dL (ref 1.5–2.5)

## 2017-03-18 LAB — LIPID PANEL
CHOLESTEROL: 227 mg/dL — AB (ref <200)
HDL: 61 mg/dL (ref >40)
Non-HDL Cholesterol (Calc): 166 mg/dL (calc) — ABNORMAL HIGH (ref <130)
Total CHOL/HDL Ratio: 3.7 (calc) (ref <5.0)
Triglycerides: 517 mg/dL — ABNORMAL HIGH (ref <150)

## 2017-03-18 LAB — MICROALBUMIN / CREATININE URINE RATIO
Creatinine, Urine: 82 mg/dL (ref 20–320)
MICROALB UR: 1 mg/dL
Microalb Creat Ratio: 12 mcg/mg creat (ref <30)

## 2017-03-18 LAB — VITAMIN D 25 HYDROXY (VIT D DEFICIENCY, FRACTURES): VIT D 25 HYDROXY: 38 ng/mL (ref 30–100)

## 2017-03-18 LAB — INSULIN, RANDOM: Insulin: 20 u[IU]/mL — ABNORMAL HIGH (ref 2.0–19.6)

## 2017-03-18 LAB — TSH: TSH: 3.86 mIU/L (ref 0.40–4.50)

## 2017-03-18 LAB — PSA: PSA: 3.2 ng/mL (ref <=4.0)

## 2017-03-19 DIAGNOSIS — L853 Xerosis cutis: Secondary | ICD-10-CM | POA: Diagnosis not present

## 2017-03-19 DIAGNOSIS — D1801 Hemangioma of skin and subcutaneous tissue: Secondary | ICD-10-CM | POA: Diagnosis not present

## 2017-03-19 DIAGNOSIS — H6123 Impacted cerumen, bilateral: Secondary | ICD-10-CM | POA: Diagnosis not present

## 2017-03-19 DIAGNOSIS — L821 Other seborrheic keratosis: Secondary | ICD-10-CM | POA: Diagnosis not present

## 2017-03-21 ENCOUNTER — Encounter (INDEPENDENT_AMBULATORY_CARE_PROVIDER_SITE_OTHER): Payer: Self-pay

## 2017-03-25 DIAGNOSIS — R972 Elevated prostate specific antigen [PSA]: Secondary | ICD-10-CM | POA: Diagnosis not present

## 2017-03-25 DIAGNOSIS — C61 Malignant neoplasm of prostate: Secondary | ICD-10-CM | POA: Diagnosis not present

## 2017-03-25 DIAGNOSIS — R361 Hematospermia: Secondary | ICD-10-CM | POA: Diagnosis not present

## 2017-03-25 DIAGNOSIS — N5201 Erectile dysfunction due to arterial insufficiency: Secondary | ICD-10-CM | POA: Diagnosis not present

## 2017-05-17 NOTE — Progress Notes (Signed)
MEDICARE ANNUAL WELLNESS VISIT AND FOLLOW UP Assessment:    Essential hypertension - continue medications, DASH diet, exercise and monitor at home. Call if greater than 130/80.  -     bisoprolol-hydrochlorothiazide (ZIAC) 5-6.25 MG tablet; Take 1 tablet by mouth daily. Take 1/2 at lunch if BP is over 140  Sinus bradycardia Monitor closely  Prostate cancer [facility]) Follows with urology  Mixed hyperlipidemia -continue medications, check lipids, decrease fatty foods, increase activity.   Prediabetes Discussed general issues about diabetes pathophysiology and management., Educational material distributed., Suggested low cholesterol diet., Encouraged aerobic exercise., Discussed foot care., Reminded to get yearly retinal exam.  Vitamin D deficiency  Elevated prostate specific antigen (PSA) Continue to monitor  Medication management  BMI 25.67,  adult  Encounter for Medicare annual wellness exam 1 year  Over 30 minutes of exam, counseling, chart review, and critical decision making was performed  Future Appointments  Date Time Provider [place]  11/02/2017  9:00 AM [name], MD [facility] None  04/04/2018  2:00 PM [name], MD [facility] None     Plan:   During the course of the visit the patient was educated and counseled about appropriate screening and preventive services including:    Pneumococcal vaccine   Influenza vaccine  Prevnar 13  Td vaccine  Screening electrocardiogram  Colorectal cancer screening  Diabetes screening  Glaucoma screening  Nutrition counseling    Subjective:  Eugene Friedman is a 84 y.o. [place] male who presents for Medicare Annual Wellness Visit and 3 month follow up for HTN, hyperlipidemia, prediabetes, and vitamin D Def.   He has been on antibiotic for 3 weeks due to wisdom tooth removal that got infected. He is leaving for [place] for 3 months. He has IBS, fiber has helped.   His blood pressure has been  controlled at home at home are under 120/60's, today their BP is BP: 137/81  BP Readings from Last 3 Encounters:  05/19/17 137/81  03/17/17 124/68  12/21/16 124/68    He does workout, walks 32mins daily. He denies chest pain, shortness of breath, dizziness.  He is on cholesterol medication and denies myalgias. His cholesterol is not at goal. The cholesterol last visit was:   Lab Results  Component Value Date   CHOL 227 (H) 03/17/2017   HDL 61 03/17/2017   [place]  03/17/2017     Comment:     . LDL cholesterol not calculated. Triglyceride levels greater than 400 mg/dL invalidate calculated LDL results. . Reference range: <100 . Desirable range <100 mg/dL for primary prevention;   <70 mg/dL for patients with CHD or diabetic patients  with > or = 2 CHD risk factors. Marland Kitchen LDL-C is now calculated using the Martin-Hopkins  calculation, which is a validated novel method providing  better accuracy than the Friedewald equation in the  estimation of LDL-C.  Cresenciano Genre et al. Annamaria Helling. 0865;784(69): 2061-2068  (http://education.[facility].com/faq/FAQ164)    TRIG 517 (H) 03/17/2017   CHOLHDL 3.7 03/17/2017    He has been working on diet and exercise for prediabetes, and denies paresthesia of the feet, polydipsia, polyuria and visual disturbances. Last A1C in the office was:  Lab Results  Component Value Date   HGBA1C 5.1 03/17/2017   Last GFR Lab Results  Component Value Date   GFRNONAA 39 (L) 03/17/2017   Patient is on Vitamin D supplement.   Lab Results  Component Value Date   VD25OH 38 03/17/2017     BMI is Body mass index  is 25.89 kg/m., he is working on diet and exercise. Wt Readings from Last 3 Encounters:  05/19/17 203 lb (92.1 kg)  03/17/17 215 lb 3.2 oz (97.6 kg)  12/21/16 209 lb 6.4 oz (95 kg)    Medication Review: Current Outpatient Medications on File Prior to Visit  Medication Sig  . aspirin EC 81 MG tablet Take 81 mg by mouth daily.  . Cholecalciferol  (VITAMIN D-3) 5000 UNITS TABS Take by mouth.  . hyoscyamine (LEVSIN SL) 0.125 MG SL tablet Take 1 to 2 tablets 4 x / day if needed for abdominal cramping, nausea or Diarrhea  . Krill Oil 1000 MG CAPS Take 1,000 mg by mouth daily.  . LUTEIN PO Take 5 mg by mouth 2 (two) times daily.   No current facility-administered medications on file prior to visit.     Allergies: Allergies  Allergen Reactions  . Adhesive [Tape]   . Celebrex [Celecoxib] Other (See Comments)    GI upset  . Lipitor [Atorvastatin]   . Nickel   . Ppd [Tuberculin Purified Protein Derivative] Other (See Comments)    Positive PPD    Current Problems (verified) has Hyperlipidemia; Sinus bradycardia; Vitamin D deficiency; History of elevated glucose; Elevated prostate specific antigen (PSA); Labile hypertension; Medication management; Prostate cancer ([place]); and Anemia on their problem list.  Screening Tests Immunization History  Administered Date(s) Administered  . H1N1 02/17/2007  . Influenza Whole 12/05/2012  . Influenza, High Dose Seasonal PF 11/14/2013, 11/20/2014, 12/16/2015, 10/30/2016  . Influenza-Unspecified 12/05/2012  . Pneumococcal Conjugate-13 12/05/2014  . Pneumococcal Polysaccharide-23 03/08/2008, 05/18/2013  . Tdap 02/17/2008  . Zoster 02/16/2005   Preventative care: Last colonoscopy: more than 10 years ago, will not get another due to age  Prior vaccinations: TD or Tdap: 2010 Influenza: 2018 Pneumococcal: 2015 Prevnar13: 2016 Shingles/Zostavax: 2007  Names of Other Physician/Practitioners you currently use: 1. [facility] here for primary care 2. [place], eye doctor, last visit 10/2016 3. Mariane Masters, dentist, last visit 02/209 Patient Care Team: [name], MD as PCP - General (Internal Medicine) Calvert Cantor, MD as Consulting Physician (Ophthalmology) Nahser, Wonda Cheng, MD as Consulting Physician (Cardiology) Carolan Clines, MD as Consulting  Physician (Urology) Laurence Spates, MD as Consulting Physician (Gastroenterology) Armandina Gemma, MD as Consulting Physician (General Surgery)  Surgical: He  has a past surgical history that includes cervical neck mass  (07/08/06); Appendectomy; Mastoidectomy (Left); Flexible sigmoidoscopy; Knee surgery; Hemorrhoid surgery; and Joint replacement. Family His family history includes Cancer in his maternal grandmother; Diabetes in his maternal aunt, mother, and paternal aunt; Heart attack (age of onset: 60) in his father; Hypertension in his father; Stroke in his mother. Social history  He reports that he has never smoked. He has never used smokeless tobacco. He reports that he drinks about 6.0 oz of alcohol per week. He reports that he does not use drugs.  MEDICARE WELLNESS OBJECTIVES: Physical activity: Current Exercise Habits: Home exercise routine, Type of exercise: calisthenics;walking, Time (Minutes): 30, Frequency (Times/Week): 4, Weekly Exercise (Minutes/Week): 120 Cardiac risk factors: Cardiac Risk Factors include: advanced age (>33men, >80 women);dyslipidemia;hypertension;male gender Depression/mood screen:   Depression screen [facility] 2/9 05/19/2017  Decreased Interest 0  Down, Depressed, Hopeless 0  PHQ - 2 Score 0    ADLs:  In your present state of health, do you have any difficulty performing the following activities: 05/19/2017 03/17/2017  Hearing? [place]? N N  Difficulty concentrating or making decisions? N N  Walking or climbing stairs? N N  Dressing or bathing? N N  Doing errands, shopping? N N  Some recent data might be hidden     Cognitive Testing  Alert? Yes  Normal Appearance?Yes  Oriented to person? Yes  Place? Yes   Time? Yes  Recall of three objects?  Yes  Can perform simple calculations? Yes  Displays appropriate judgment?Yes  Can read the correct time from a watch face?Yes  EOL planning: Does Patient Have a Medical Advance Directive?: Yes Type of  Advance Directive: Healthcare Power of Attorney, Living will Copy of [place] in Chart?: No - copy requested   Objective:   Today's Vitals   05/19/17 1033  BP: 137/81  Pulse: 64  Resp: 16  Temp: (!) 97.5 F (36.4 C)  SpO2: 97%  Weight: 203 lb (92.1 kg)  Height: 6' 2.25" (1.886 m)   Body mass index is 25.89 kg/m.  General appearance: alert, no distress, WD/WN, male HEENT: normocephalic, sclerae anicteric, TMs pearly, nares patent, no discharge or erythema, pharynx normal Oral cavity: MMM, no lesions Neck: supple, no lymphadenopathy, no thyromegaly, no masses Heart: RRR, normal S1, S2, no murmurs Lungs: CTA bilaterally, no wheezes, rhonchi, or rales Abdomen: +bs, soft, non tender, non distended, no masses, no hepatomegaly, no splenomegaly Musculoskeletal: nontender, no swelling, no obvious deformity Extremities: no edema, no cyanosis, no clubbing Pulses: 2+ symmetric, upper and lower extremities, normal cap refill Neurological: alert, oriented x 3, CN2-12 intact, strength normal upper extremities and lower extremities, sensation normal throughout, DTRs 2+ throughout, no cerebellar signs, gait normal Psychiatric: normal affect, behavior normal, pleasant   Medicare Attestation I have personally reviewed: The patient's medical and social history Their use of alcohol, tobacco or illicit drugs Their current medications and supplements The patient's functional ability including ADLs,fall risks, home safety risks, cognitive, and hearing and visual impairment Diet and physical activities Evidence for depression or mood disorders  The patient's weight, height, BMI, and visual acuity have been recorded in the chart.  I have made referrals, counseling, and provided education to the patient based on review of the above and I have provided the patient with a written personalized care plan for preventive services.     Vicie Mutters, PA-C   05/19/2017

## 2017-05-19 ENCOUNTER — Other Ambulatory Visit: Payer: Self-pay

## 2017-05-19 ENCOUNTER — Ambulatory Visit (INDEPENDENT_AMBULATORY_CARE_PROVIDER_SITE_OTHER): Payer: Medicare Other | Admitting: Physician Assistant

## 2017-05-19 ENCOUNTER — Encounter: Payer: Self-pay | Admitting: Physician Assistant

## 2017-05-19 VITALS — BP 137/81 | HR 64 | Temp 97.5°F | Resp 16 | Ht 74.25 in | Wt 203.0 lb

## 2017-05-19 DIAGNOSIS — Z6825 Body mass index (BMI) 25.0-25.9, adult: Secondary | ICD-10-CM

## 2017-05-19 DIAGNOSIS — Z8639 Personal history of other endocrine, nutritional and metabolic disease: Secondary | ICD-10-CM | POA: Diagnosis not present

## 2017-05-19 DIAGNOSIS — R6889 Other general symptoms and signs: Secondary | ICD-10-CM

## 2017-05-19 DIAGNOSIS — Z0001 Encounter for general adult medical examination with abnormal findings: Secondary | ICD-10-CM | POA: Diagnosis not present

## 2017-05-19 DIAGNOSIS — Z79899 Other long term (current) drug therapy: Secondary | ICD-10-CM | POA: Diagnosis not present

## 2017-05-19 DIAGNOSIS — E559 Vitamin D deficiency, unspecified: Secondary | ICD-10-CM

## 2017-05-19 DIAGNOSIS — R0989 Other specified symptoms and signs involving the circulatory and respiratory systems: Secondary | ICD-10-CM

## 2017-05-19 DIAGNOSIS — C61 Malignant neoplasm of prostate: Secondary | ICD-10-CM

## 2017-05-19 DIAGNOSIS — E782 Mixed hyperlipidemia: Secondary | ICD-10-CM

## 2017-05-19 DIAGNOSIS — R001 Bradycardia, unspecified: Secondary | ICD-10-CM | POA: Diagnosis not present

## 2017-05-19 DIAGNOSIS — D649 Anemia, unspecified: Secondary | ICD-10-CM | POA: Diagnosis not present

## 2017-05-19 DIAGNOSIS — K582 Mixed irritable bowel syndrome: Secondary | ICD-10-CM

## 2017-05-19 DIAGNOSIS — Z0000 Encounter for general adult medical examination without abnormal findings: Secondary | ICD-10-CM

## 2017-05-19 DIAGNOSIS — I10 Essential (primary) hypertension: Secondary | ICD-10-CM

## 2017-05-19 LAB — HEPATIC FUNCTION PANEL
AG RATIO: 1.9 (calc) (ref 1.0–2.5)
ALBUMIN MSPROF: 4.6 g/dL (ref 3.6–5.1)
ALT: 23 U/L (ref 9–46)
AST: 22 U/L (ref 10–35)
Alkaline phosphatase (APISO): 70 U/L (ref 40–115)
Bilirubin, Direct: 0.1 mg/dL (ref 0.0–0.2)
GLOBULIN: 2.4 g/dL (ref 1.9–3.7)
Indirect Bilirubin: 0.7 mg/dL (calc) (ref 0.2–1.2)
TOTAL PROTEIN: 7 g/dL (ref 6.1–8.1)
Total Bilirubin: 0.8 mg/dL (ref 0.2–1.2)

## 2017-05-19 LAB — CBC WITH DIFFERENTIAL/PLATELET
BASOS PCT: 0.7 %
Basophils Absolute: 32 cells/uL (ref 0–200)
Eosinophils Absolute: 131 cells/uL (ref 15–500)
Eosinophils Relative: 2.9 %
HCT: 38.7 % (ref 38.5–50.0)
HEMOGLOBIN: 13.4 g/dL (ref 13.2–17.1)
Lymphs Abs: 923 cells/uL (ref 850–3900)
MCH: 30.9 pg (ref 27.0–33.0)
MCHC: 34.6 g/dL (ref 32.0–36.0)
MCV: 89.2 fL (ref 80.0–100.0)
MPV: 9.7 fL (ref 7.5–12.5)
Monocytes Relative: 12.2 %
NEUTROS ABS: 2867 {cells}/uL (ref 1500–7800)
Neutrophils Relative %: 63.7 %
PLATELETS: 204 10*3/uL (ref 140–400)
RBC: 4.34 10*6/uL (ref 4.20–5.80)
RDW: 12.8 % (ref 11.0–15.0)
TOTAL LYMPHOCYTE: 20.5 %
WBC mixed population: 549 cells/uL (ref 200–950)
WBC: 4.5 10*3/uL (ref 3.8–10.8)

## 2017-05-19 LAB — LIPID PANEL
CHOL/HDL RATIO: 3 (calc) (ref <5.0)
Cholesterol: 207 mg/dL — ABNORMAL HIGH (ref <200)
HDL: 68 mg/dL (ref >40)
LDL CHOLESTEROL (CALC): 118 mg/dL — AB
Non-HDL Cholesterol (Calc): 139 mg/dL (calc) — ABNORMAL HIGH (ref <130)
TRIGLYCERIDES: 103 mg/dL (ref <150)

## 2017-05-19 LAB — BASIC METABOLIC PANEL WITH GFR
BUN / CREAT RATIO: 18 (calc) (ref 6–22)
BUN: 27 mg/dL — AB (ref 7–25)
CALCIUM: 9.4 mg/dL (ref 8.6–10.3)
CO2: 26 mmol/L (ref 20–32)
CREATININE: 1.48 mg/dL — AB (ref 0.70–1.11)
Chloride: 102 mmol/L (ref 98–110)
GFR, EST AFRICAN AMERICAN: 49 mL/min/{1.73_m2} — AB (ref >=60)
GFR, Est Non African American: 42 mL/min/{1.73_m2} — ABNORMAL LOW (ref >=60)
GLUCOSE: 95 mg/dL (ref 65–99)
Potassium: 4.9 mmol/L (ref 3.5–5.3)
Sodium: 136 mmol/L (ref 135–146)

## 2017-05-19 LAB — MAGNESIUM: Magnesium: 2.2 mg/dL (ref 1.5–2.5)

## 2017-05-19 LAB — TSH: TSH: 2.65 mIU/L (ref 0.40–4.50)

## 2017-05-19 MED ORDER — DICYCLOMINE HCL 20 MG PO TABS: ORAL_TABLET | ORAL | 3 refills | Status: DC

## 2017-05-19 MED ORDER — BISOPROLOL FUMARATE 5 MG PO TABS: ORAL_TABLET | ORAL | 1 refills | Status: DC

## 2017-05-19 MED ORDER — AZITHROMYCIN 250 MG PO TABS: ORAL_TABLET | ORAL | 1 refills | Status: AC

## 2017-05-19 MED ORDER — FINASTERIDE 5 MG PO TABS: 5.0000 mg | ORAL_TABLET | Freq: Every day | ORAL | 2 refills | Status: DC

## 2017-05-19 MED ORDER — NEOMYCIN-POLYMYXIN-HC 3.5-10000-1 OT SUSP: 4.0000 [drp] | Freq: Four times a day (QID) | OTIC | 1 refills | Status: DC

## 2017-05-19 MED ORDER — OSELTAMIVIR PHOSPHATE 75 MG PO CAPS: 75.0000 mg | ORAL_CAPSULE | Freq: Every day | ORAL | 0 refills | Status: AC

## 2017-05-19 MED ORDER — TADALAFIL 10 MG PO TABS: ORAL_TABLET | ORAL | 3 refills | Status: DC

## 2017-05-19 MED ORDER — DOXAZOSIN MESYLATE 8 MG PO TABS: ORAL_TABLET | ORAL | 1 refills | Status: DC

## 2017-05-19 NOTE — Patient Instructions (Addendum)
Get on probiotic x 1 month since you were on an antibiotic for such a long time This may help with stomach issues   Can take BP med 1/2 pill in the afternoon  Benefiber or Citracel is good for constipation/diarrhea/irritable bowel syndrome, it helps with weight loss and can help lower your bad cholesterol. Please do 1 TBSP in the morning in water, coffee, or tea. It can take up to a month before you can see a difference with your bowel movements. It is cheapest from [employer], [employer], [employer].   Can take the tablet AS needed for IBS.   Aspirin can reduce risk of Colon Cancer by 20 %, Skin Cancer by 26 % , Melanoma by 46% and Pancreatic cancer by 60%- since you are low risk you can stop the aspirin if you want.   Influenza is a virus that does not respond to antibiotics. We can give you medications to treat the symptoms.   Tamiflu can be used as a prevention, once a day for 10 days if someone that you have close contact with in your house has been diagnosed with the flu. Please read the potential side effects below, this medication is not without risk and this medication can be expensive.   -allergic reactions like skin rash, itching or hives, swelling of the face, lips, or tongue -anxiety, confusion, unusual behavior -breathing problems -hallucination, loss of contact with reality -redness, blistering, peeling or loosening of the skin, including inside the mouth -seizures Side effects that usually do not require medical attention (report to your doctor or health care professional if they continue or are bothersome): -cough -diarrhea -dizziness -headache -nausea, vomiting -stomach pain   10 Tips on Belching, Bloating, and Flatulence 1. Belching is caused by swallowed air from:  Eating or drinking too fast  Poorly fitting dentures; not chewing food completely  Carbonated beverages  Chewing gum or sucking on hard candies  Excessive swallowing due to nervous tension or postnasal drip   Forced belching to relieve abdominal discomfort 2. To prevent excessive belching, avoid:  Carbonated beverages  Chewing gum  Hard candies  Simethicone/GasX may be helpful  3. Abdominal bloating and discomfort may be due to intestinal sensitivity or symptoms of irritable bowel syndrome. To relieve symptoms, avoid:  Broccoli  Baked beans  Cabbage  Carbonated drinks  Cauliflower  Chewing gum  Hard candy 4. Abdominal distention resulting from weak abdominal muscles:  Is better in the morning  Gets worse as the day progresses  Is relieved by lying down 5. To prevent Abdominal distention:  Tighten abdominal muscles by pulling in your stomach several times during the day  Do sit-up exercises if possible  Wear an abdominal support garment if exercise is too difficult 6. Flatulence is gas created through bacterial action in the bowel and passed rectally. Keep in mind that:  10-18 passages per day are normal  Primary gases are harmless and odorless  Noticeable smells are trace gases related to food intake 7. Foods that are likely to form gas include:  Milk, dairy products, and medications that contain lactose--If your body doesn't produce the enzyme (lactase) to break it down.  Certain vegetables--baked beans, cauliflower, broccoli, cabbage  Certain starches--wheat, oats, corn, potatoes. Rice is a good substitute. 8. Identify offending foods. Reduce or eliminate these gas-forming foods from your diet.   .Intermittent fasting is more about strategy than starvation. It's meant to reset your body in different ways, hopefully with fitness and nutrition changes as a result.  Like any big switchover, though, results may vary when it comes down to the individual level. What works for your friends may not work for you, or vice versa. That's why it's helpful to play around with variations on intermittent fasting and healthy habits and find what works best for you.  WHAT IS INTERMITTENT FASTING  AND WHY DO IT?  Intermittent fasting doesn't involve specific foods, but rather, a strict schedule regarding when you eat. Also called "time-restricted eating," the tactic has been praised for its contribution to weight loss, improved body composition, and decreased cravings. Preliminary research also suggests it may be beneficial for glucose tolerance, hormone regulation, better muscle mass and lower body fat.  Part of its appeal is the simplicity of the effort. Unlike some other trends, there's no calculations to intermittent fasting.  You simply eat within a certain block of time, usually a window of 8-10 hours. In the other big block of time - about 14-16 hours, including when you're asleep - you don't eat anything, not even snacks. You can drink water, coffee, tea or any other beverage that doesn't have calories.  For example, if you like having a late dinner, you might skip breakfast and have your first meal at noon and your last meal of the day at 8 p.m., and then not eat until noon again the next day.  IDEAS FOR GETTING STARTED  If you're new to the strategy, it may be helpful to eat within the typical circadian rhythm and keep eating within daylight hours. This can be especially beneficial if you're looking at intermittent fasting for weight-loss goals.  So first try only eating between 12pm to 8pm.  Outside of this time you may have water, black coffee, and hot tea. You may not eat it drink anything that has carbs, sugars, OR artificial sugars like diet soda.   Like any major eating and fitness shift, it can take time to find the perfect fit, so don't be afraid to experiment with different options - including ditching intermittent fasting altogether if it's simply not for you. But if it is, you may be surprised by some of the benefits that come along with the strategy.

## 2017-07-17 DIAGNOSIS — J029 Acute pharyngitis, unspecified: Secondary | ICD-10-CM | POA: Diagnosis not present

## 2017-07-17 DIAGNOSIS — R509 Fever, unspecified: Secondary | ICD-10-CM | POA: Diagnosis not present

## 2017-07-17 DIAGNOSIS — R05 Cough: Secondary | ICD-10-CM | POA: Diagnosis not present

## 2017-07-17 DIAGNOSIS — J209 Acute bronchitis, unspecified: Secondary | ICD-10-CM | POA: Diagnosis not present

## 2017-11-01 NOTE — Progress Notes (Signed)
This very nice 85 y.o. widowed Swede man who  presents for 6 month follow up with HTN, HLD, Pre-Diabetes and Vitamin D Deficiency. Patient also has Gleason 6 prostate followed by active surveillance (Dr Gerald Stabs Lovena Neighbours). Patient also has IBS-D and usu takes hyoscyamine  Or Dicyclomine 1 to 2 x/day.      Patient is treated for HTN (2001) & BP has been controlled at home. Today's BP is at goal at 144/64.  Negative Cardiolite in 2011. Patient has had no complaints of any cardiac type chest pain, palpitations, dyspnea / orthopnea / PND, dizziness, claudication, or dependent edema.     Hyperlipidemia is not controlled with diet & meds. Patient denies myalgias or other med SE's. Last Lipids were not at goal: Lab Results  Component Value Date   CHOL 207 (H) 05/19/2017   HDL 68 05/19/2017   LDLCALC 118 (H) 05/19/2017   TRIG 103 05/19/2017   CHOLHDL 3.0 05/19/2017      Also, the patient has history of PreDiabetes  (A1c 6.1%/2014) and has had no symptoms of reactive hypoglycemia, diabetic polys, paresthesias or visual blurring.  Last A1c was Normal & at goal: Lab Results  Component Value Date   HGBA1C 5.1 03/17/2017      Further, the patient also has history of Vitamin D Deficiency ("42"/2009)  and supplements vitamin D without any suspected side-effects. Last vitamin D was still low (goal 70-100): Lab Results  Component Value Date   VD25OH 38 03/17/2017   Current Outpatient Medications on File Prior to Visit  Medication Sig  . aspirin EC 81 MG tablet Take 81 mg by mouth daily.  . bisoprolol (ZEBETA) 5 MG tablet Take 1/2 to 1 tablet daily for BP and Pulse Rate  . Cholecalciferol (VITAMIN D-3) 5000 UNITS TABS Take by mouth.  . dicyclomine (BENTYL) 20 MG tablet Take 1 tablet 3 x/ day before meals for bloating, cramping, or diarrhea  . doxazosin (CARDURA) 8 MG tablet Take 1/2 to 1 tablet at Bedtime for Prostate & BP  . finasteride (PROSCAR) 5 MG tablet Take 1 tablet (5 mg total) by mouth daily.    . hyoscyamine (LEVSIN SL) 0.125 MG SL tablet Take 1 to 2 tablets 4 x / day if needed for abdominal cramping, nausea or Diarrhea  . Krill Oil 1000 MG CAPS Take 1,000 mg by mouth daily.  . LUTEIN PO Take 5 mg by mouth 2 (two) times daily.  Marland Kitchen neomycin-polymyxin-hydrocortisone (CORTISPORIN) 3.5-10000-1 OTIC suspension Place 4 drops into both ears 4 (four) times daily.  . tadalafil (CIALIS) 10 MG tablet 1/2-1 pill daily as directed.   No current facility-administered medications on file prior to visit.    Allergies  Allergen Reactions  . Adhesive [Tape]   . Celebrex [Celecoxib] Other (See Comments)    GI upset  . Lipitor [Atorvastatin]   . Nickel   . Ppd [Tuberculin Purified Protein Derivative] Other (See Comments)    Positive PPD   PMHx:   Past Medical History:  Diagnosis Date  . Allergic rhinitis   . Cancer [facility])    prostate  . DJD (degenerative joint disease)   . Hypertension    Borderline   . Sinus bradycardia   . Vitamin D deficiency    Immunization History  Administered Date(s) Administered  . H1N1 02/17/2007  . Influenza Whole 12/05/2012  . Influenza, High Dose Seasonal PF 11/14/2013, 11/20/2014, 12/16/2015, 10/30/2016  . Influenza-Unspecified 12/05/2012  . Pneumococcal Conjugate-13 12/05/2014  . Pneumococcal Polysaccharide-23  03/08/2008, 05/18/2013  . Tdap 02/17/2008  . Zoster 02/16/2005   Past Surgical History:  Procedure Laterality Date  . APPENDECTOMY    . cervical neck mass   07/08/06   Left Excision left posterior cervical neck mass (2.5 cm). SURGEON:  Earnstine Regal, M.D., Allegra Grana 07/08/06 and 12/05/07  . FLEXIBLE SIGMOIDOSCOPY    . HEMORRHOID SURGERY    . JOINT REPLACEMENT     Left knee 2013  . KNEE SURGERY    . MASTOIDECTOMY Left    FHx:    Reviewed / unchanged  SHx:    Reviewed / unchanged   Systems Review:  Constitutional: Denies fever, chills, wt changes, headaches, insomnia, fatigue, night sweats, change in appetite. Eyes: Denies redness,  blurred vision, diplopia, discharge, itchy, watery eyes.  ENT: Denies discharge, congestion, post nasal drip, epistaxis, sore throat, earache, hearing loss, dental pain, tinnitus, vertigo, sinus pain, snoring.  CV: Denies chest pain, palpitations, irregular heartbeat, syncope, dyspnea, diaphoresis, orthopnea, PND, claudication or edema. Respiratory: denies cough, dyspnea, DOE, pleurisy, hoarseness, laryngitis, wheezing.  Gastrointestinal: Denies dysphagia, odynophagia, heartburn, reflux, water brash, abdominal pain or cramps, nausea, vomiting, bloating, diarrhea, constipation, hematemesis, melena, hematochezia  or hemorrhoids. Genitourinary: Denies dysuria, frequency, urgency, nocturia, hesitancy, discharge, hematuria or flank pain. Musculoskeletal: Denies arthralgias, myalgias, stiffness, jt. swelling, pain, limping or strain/sprain.  Skin: Denies pruritus, rash, hives, warts, acne, eczema or change in skin lesion(s). Neuro: No weakness, tremor, incoordination, spasms, paresthesia or pain. Psychiatric: Denies confusion, memory loss or sensory loss. Endo: Denies change in weight, skin or hair change.  Heme/Lymph: No excessive bleeding, bruising or enlarged lymph nodes.  Physical Exam  BP (!) 144/64   Pulse (!) 56   Temp (!) 97.1 F (36.2 C)   Resp 16   Ht 6' 2.5" (1.892 m)   Wt 201 lb 9.6 oz (91.4 kg)   BMI 25.54 kg/m   Appears  well nourished, well groomed  and in no distress.  Eyes: PERRLA, EOMs, conjunctiva no swelling or erythema. Sinuses: No frontal/maxillary tenderness ENT/Mouth: EAC's clear, TM's nl w/o erythema, bulging. Nares clear w/o erythema, swelling, exudates. Oropharynx clear without erythema or exudates. Oral hygiene is good. Tongue normal, non obstructing. Hearing intact.  Neck: Supple. Thyroid not palpable. Car 2+/2+ without bruits, nodes or JVD. Chest: Respirations nl with BS clear & equal w/o rales, rhonchi, wheezing or stridor.  Cor: Heart sounds normal w/ regular  rate and rhythm without sig. murmurs, gallops, clicks or rubs. Peripheral pulses normal and equal  without edema.  Abdomen: Soft & bowel sounds normal. Non-tender w/o guarding, rebound, hernias, masses or organomegaly.  Lymphatics: Unremarkable.  Musculoskeletal: Full ROM all peripheral extremities, joint stability, 5/5 strength and normal gait.  Skin: Warm, dry without exposed rashes, lesions or ecchymosis apparent.  Neuro: Cranial nerves intact, reflexes equal bilaterally. Sensory-motor testing grossly intact. Tendon reflexes grossly intact.  Pysch: Alert & oriented x 3.  Insight and judgement nl & appropriate. No ideations.  Assessment and Plan:  1. Labile hypertension  - Continue medication, monitor blood pressure at home.  - Continue DASH diet.  Reminder to go to the ER if any CP,  SOB, nausea, dizziness, severe HA, changes vision/speech.  - CBC with Differential/Platelet - COMPLETE METABOLIC PANEL WITH GFR - Magnesium - TSH  2. Hyperlipidemia, mixed  - Continue diet/meds, exercise,& lifestyle modifications.  - Continue monitor periodic cholesterol/liver & renal functions   - TSH  3. Abnormal glucose  - Continue diet, exercise, lifestyle modifications.  - Monitor appropriate  labs.  - Hemoglobin A1c - Insulin, random  4. Vitamin D deficiency  - Continue supplementation.  - VITAMIN D 25 Hydroxyl  5. Prediabetes  - Hemoglobin A1c - Insulin, random  6. Irritable bowel syndrome   7. Medication management  - CBC with Differential/Platelet - COMPLETE METABOLIC PANEL WITH GFR - Magnesium - TSH - Hemoglobin A1c - Insulin, random - VITAMIN D 25 Hydroxyl        Discussed  regular exercise, BP monitoring, weight control to achieve/maintain BMI less than 25 and discussed med and SE's. Recommended labs to assess and monitor clinical status with further disposition pending results of labs. Over 30 minutes of exam, counseling, chart review was performed.

## 2017-11-01 NOTE — Patient Instructions (Signed)

## 2017-11-02 ENCOUNTER — Encounter: Payer: Self-pay | Admitting: Internal Medicine

## 2017-11-02 ENCOUNTER — Ambulatory Visit (INDEPENDENT_AMBULATORY_CARE_PROVIDER_SITE_OTHER): Payer: Medicare Other | Admitting: Internal Medicine

## 2017-11-02 VITALS — BP 144/64 | HR 56 | Temp 97.1°F | Resp 16 | Ht 74.5 in | Wt 201.6 lb

## 2017-11-02 DIAGNOSIS — R0989 Other specified symptoms and signs involving the circulatory and respiratory systems: Secondary | ICD-10-CM | POA: Diagnosis not present

## 2017-11-02 DIAGNOSIS — R7309 Other abnormal glucose: Secondary | ICD-10-CM

## 2017-11-02 DIAGNOSIS — R7303 Prediabetes: Secondary | ICD-10-CM | POA: Diagnosis not present

## 2017-11-02 DIAGNOSIS — Z79899 Other long term (current) drug therapy: Secondary | ICD-10-CM

## 2017-11-02 DIAGNOSIS — K589 Irritable bowel syndrome without diarrhea: Secondary | ICD-10-CM | POA: Diagnosis not present

## 2017-11-02 DIAGNOSIS — E782 Mixed hyperlipidemia: Secondary | ICD-10-CM

## 2017-11-02 DIAGNOSIS — E559 Vitamin D deficiency, unspecified: Secondary | ICD-10-CM

## 2017-11-03 LAB — COMPLETE METABOLIC PANEL WITH GFR
AG Ratio: 1.8 (calc) (ref 1.0–2.5)
ALKALINE PHOSPHATASE (APISO): 66 U/L (ref 40–115)
ALT: 20 U/L (ref 9–46)
AST: 19 U/L (ref 10–35)
Albumin: 4.6 g/dL (ref 3.6–5.1)
BILIRUBIN TOTAL: 0.7 mg/dL (ref 0.2–1.2)
BUN/Creatinine Ratio: 20 (calc) (ref 6–22)
BUN: 31 mg/dL — AB (ref 7–25)
CHLORIDE: 103 mmol/L (ref 98–110)
CO2: 31 mmol/L (ref 20–32)
Calcium: 9.8 mg/dL (ref 8.6–10.3)
Creat: 1.54 mg/dL — ABNORMAL HIGH (ref 0.70–1.11)
GFR, Est African American: 46 mL/min/{1.73_m2} — ABNORMAL LOW (ref >=60)
GFR, Est Non African American: 40 mL/min/{1.73_m2} — ABNORMAL LOW (ref >=60)
GLUCOSE: 95 mg/dL (ref 65–99)
Globulin: 2.5 g/dL (calc) (ref 1.9–3.7)
POTASSIUM: 5.2 mmol/L (ref 3.5–5.3)
Sodium: 138 mmol/L (ref 135–146)
Total Protein: 7.1 g/dL (ref 6.1–8.1)

## 2017-11-03 LAB — CBC WITH DIFFERENTIAL/PLATELET
BASOS ABS: 31 {cells}/uL (ref 0–200)
BASOS PCT: 0.7 %
EOS ABS: 180 {cells}/uL (ref 15–500)
Eosinophils Relative: 4.1 %
HCT: 41.3 % (ref 38.5–50.0)
Hemoglobin: 13.9 g/dL (ref 13.2–17.1)
Lymphs Abs: 1113 cells/uL (ref 850–3900)
MCH: 30.7 pg (ref 27.0–33.0)
MCHC: 33.7 g/dL (ref 32.0–36.0)
MCV: 91.2 fL (ref 80.0–100.0)
MONOS PCT: 12.9 %
MPV: 10.4 fL (ref 7.5–12.5)
NEUTROS PCT: 57 %
Neutro Abs: 2508 cells/uL (ref 1500–7800)
PLATELETS: 206 10*3/uL (ref 140–400)
RBC: 4.53 10*6/uL (ref 4.20–5.80)
RDW: 13.2 % (ref 11.0–15.0)
TOTAL LYMPHOCYTE: 25.3 %
WBC mixed population: 568 cells/uL (ref 200–950)
WBC: 4.4 10*3/uL (ref 3.8–10.8)

## 2017-11-03 LAB — VITAMIN D 25 HYDROXY (VIT D DEFICIENCY, FRACTURES): Vit D, 25-Hydroxy: 50 ng/mL (ref 30–100)

## 2017-11-03 LAB — INSULIN, RANDOM: Insulin: 1.6 u[IU]/mL — ABNORMAL LOW (ref 2.0–19.6)

## 2017-11-03 LAB — HEMOGLOBIN A1C
Hgb A1c MFr Bld: 5.2 % of total Hgb (ref <5.7)
MEAN PLASMA GLUCOSE: 103 (calc)
eAG (mmol/L): 5.7 (calc)

## 2017-11-03 LAB — MAGNESIUM: MAGNESIUM: 2.2 mg/dL (ref 1.5–2.5)

## 2017-11-03 LAB — TSH: TSH: 2.41 mIU/L (ref 0.40–4.50)

## 2017-11-08 DIAGNOSIS — C61 Malignant neoplasm of prostate: Secondary | ICD-10-CM | POA: Diagnosis not present

## 2017-11-08 DIAGNOSIS — L570 Actinic keratosis: Secondary | ICD-10-CM | POA: Diagnosis not present

## 2017-11-08 DIAGNOSIS — L812 Freckles: Secondary | ICD-10-CM | POA: Diagnosis not present

## 2017-11-08 DIAGNOSIS — L821 Other seborrheic keratosis: Secondary | ICD-10-CM | POA: Diagnosis not present

## 2017-11-08 DIAGNOSIS — L820 Inflamed seborrheic keratosis: Secondary | ICD-10-CM | POA: Diagnosis not present

## 2017-12-07 NOTE — Progress Notes (Signed)
Assessment and Plan:  Eugene Friedman was seen today for follow-up.  Diagnoses and all orders for this visit:  Posterior pain of hip, right Non-specific exam, hx of knee arthritis, suggested XR to r/o bony abnormality, patient declines this and medications today, requesting referral to Dr. Rudene Anda office for PT and treatment as recommended.  -     Ambulatory referral to Orthopedics  Further disposition pending results of labs. Discussed med's effects and SE's.   Over 15 minutes of exam, counseling, chart review, and critical decision making was performed.   Future Appointments  Date Time Provider [place]  12/17/2017 10:15 AM Garald Balding, MD [facility] None  02/02/2018  8:45 AM Liane Comber, NP [facility] None  05/04/2018 11:00 AM [name], MD [facility] None    ------------------------------------------------------------------------------------------------------------------   HPI BP 126/82   Pulse 63   Temp (!) 97.2 F (36.2 C)   Ht 6' 2.5" (1.892 m)   Wt 203 lb 12.8 oz (92.4 kg)   SpO2 97%   BMI 25.82 kg/m   84 y.o.male presents for 4-5 days of right posterior hip pain in the AM with radiation down the back of the thigh in the AM that resolves within a few hours; describes pain as "heavy" "like a muscle cramp." He reports at the worst the pain is 8-9/10, pain is worse with weight bearing and external rotation of hip. He has not tried any pain medication for this condition thus far.   He wanted to see PT, requesting referral to PT associated with Dr. Durward Fortes, but was told he needs a referral. He prefers not to use medications and declines prescription today. Discussed XR today which he declines.   He is s/p L partial replacement approx 7 years at [facility].   Hx of prostate cancer Patient is followed closely by Dr Radene Journey 2007  Dr Gerald Stabs Lovena Neighbours for Gleason 6 Ca of Prostate on Proscar and by Active Surveillance.   Past Medical History:  Diagnosis Date   . Allergic rhinitis   . Cancer [facility])    prostate  . DJD (degenerative joint disease)   . Hypertension    Borderline   . Sinus bradycardia   . Vitamin D deficiency      Allergies  Allergen Reactions  . Adhesive [Tape]   . Celebrex [Celecoxib] Other (See Comments)    GI upset  . Lipitor [Atorvastatin]   . Nickel   . Ppd [Tuberculin Purified Protein Derivative] Other (See Comments)    Positive PPD    Current Outpatient Medications on File Prior to Visit  Medication Sig  . aspirin EC 81 MG tablet Take 81 mg by mouth daily.  . bisoprolol (ZEBETA) 5 MG tablet Take 1/2 to 1 tablet daily for BP and Pulse Rate (Patient taking differently: as needed. Take 1/2 to 1 tablet daily for BP and Pulse Rate)  . Cholecalciferol (VITAMIN D-3) 5000 UNITS TABS Take by mouth.  . doxazosin (CARDURA) 8 MG tablet Take 1/2 to 1 tablet at Bedtime for Prostate & BP  . finasteride (PROSCAR) 5 MG tablet Take 1 tablet (5 mg total) by mouth daily.  Javier Docker Oil 1000 MG CAPS Take 1,000 mg by mouth daily.  . LUTEIN PO Take 5 mg by mouth 2 (two) times daily.  . tadalafil (CIALIS) 10 MG tablet 1/2-1 pill daily as directed.   No current facility-administered medications on file prior to visit.     ROS: all negative except above.   Physical Exam:  BP 126/82  Pulse 63   Temp (!) 97.2 F (36.2 C)   Ht 6' 2.5" (1.892 m)   Wt 203 lb 12.8 oz (92.4 kg)   SpO2 97%   BMI 25.82 kg/m   General Appearance: Well nourished, in no apparent distress. Eyes: PERRL,conjunctiva no swelling or erythema ENT/Mouth: Somewhat HOH Neck: Supple.  Respiratory: Respiratory effort normal, BS equal bilaterally without rales, rhonchi, wheezing or stridor.  Cardio: RRR with no MRGs. Brisk peripheral pulses without edema.  Abdomen: Soft, + BS.  Non tender, no guarding, rebound, palpable hernias, masses. Lymphatics: Non tender without lymphadenopathy.  Musculoskeletal: Full ROM, 5/5 strength, slow/normal gait, no notable crepitus  of lumbar spine, SI joint tenderness, bilateral hips, knees. No palpable/obvious deformity, effusion. Negative straight leg raise. No muscle spasms at time of exam.   Skin: Warm, dry without rashes, lesions, ecchymosis.  Neuro: Cranial nerves intact. Normal muscle tone, no cerebellar symptoms. Sensation intact. LE reflexes diminished throughout.  Psych: Awake and oriented X 3, normal affect, Insight and Judgment fair.    [name], NP 10:51 AM [facility]

## 2017-12-08 ENCOUNTER — Other Ambulatory Visit: Payer: Self-pay | Admitting: Adult Health

## 2017-12-08 ENCOUNTER — Ambulatory Visit (INDEPENDENT_AMBULATORY_CARE_PROVIDER_SITE_OTHER): Payer: Medicare Other | Admitting: Adult Health

## 2017-12-08 ENCOUNTER — Encounter: Payer: Self-pay | Admitting: Adult Health

## 2017-12-08 VITALS — BP 126/82 | HR 63 | Temp 97.2°F | Ht 74.5 in | Wt 203.8 lb

## 2017-12-08 DIAGNOSIS — K582 Mixed irritable bowel syndrome: Secondary | ICD-10-CM | POA: Diagnosis not present

## 2017-12-08 DIAGNOSIS — M25551 Pain in right hip: Secondary | ICD-10-CM

## 2017-12-08 DIAGNOSIS — K589 Irritable bowel syndrome without diarrhea: Secondary | ICD-10-CM | POA: Diagnosis not present

## 2017-12-08 MED ORDER — DICYCLOMINE HCL 20 MG PO TABS: ORAL_TABLET | ORAL | 3 refills | Status: DC

## 2017-12-08 MED ORDER — HYOSCYAMINE SULFATE 0.125 MG SL SUBL: SUBLINGUAL_TABLET | SUBLINGUAL | 1 refills | Status: DC

## 2017-12-08 MED ORDER — NEOMYCIN-POLYMYXIN-HC 3.5-10000-1 OT SUSP: 4.0000 [drp] | Freq: Four times a day (QID) | OTIC | 1 refills | Status: DC

## 2017-12-17 ENCOUNTER — Ambulatory Visit (INDEPENDENT_AMBULATORY_CARE_PROVIDER_SITE_OTHER): Payer: Medicare Other | Admitting: Orthopaedic Surgery

## 2017-12-17 ENCOUNTER — Ambulatory Visit (INDEPENDENT_AMBULATORY_CARE_PROVIDER_SITE_OTHER): Payer: Medicare Other

## 2017-12-17 ENCOUNTER — Encounter (INDEPENDENT_AMBULATORY_CARE_PROVIDER_SITE_OTHER): Payer: Self-pay | Admitting: Orthopaedic Surgery

## 2017-12-17 VITALS — BP 136/81 | HR 88 | Ht 75.0 in | Wt 203.0 lb

## 2017-12-17 DIAGNOSIS — M25551 Pain in right hip: Secondary | ICD-10-CM

## 2017-12-17 DIAGNOSIS — G8929 Other chronic pain: Secondary | ICD-10-CM | POA: Diagnosis not present

## 2017-12-17 DIAGNOSIS — M545 Low back pain: Secondary | ICD-10-CM | POA: Diagnosis not present

## 2017-12-17 NOTE — Addendum Note (Signed)
Addended by: Deeann Dowse R on: 12/17/2017 03:58 PM   Modules accepted: Orders

## 2017-12-17 NOTE — Progress Notes (Signed)
Office Visit Note   Patient: Eugene Friedman           Date of Birth: [birth year]           MRN: [id number] Visit Date: 12/17/2017              Requested by: Eugene Friedman, [address] [place], [place] [address] PCP: Eugene Pinto, MD   Assessment & Plan: Visit Diagnoses:  1. Pain in right hip     Plan: Pain right hip could be a combination of factors including the osteoarthritis of the right hip and possibly claudication referred from his lumbar spine.  I think it showed obtaining an MRI of the lumbar spine.  Follow-Up Instructions: Return after MRI L-S spine.   Orders:  Orders Placed This Encounter  Procedures  . XR HIP UNILAT W OR W/O PELVIS 2-3 VIEWS RIGHT   No orders of the defined types were placed in this encounter.     Procedures: No procedures performed   Clinical Data: No additional findings.   Subjective: Chief Complaint  Patient presents with  . New Patient (Initial Visit)    R HIP PAIN FOR 10 DAYS NO INJURY. HAD PAIN 3 YRS AGO BUT WENT AWAY NO PRIOR INJECTION OR SURGERY  Eugene Friedman is 84 years old visited the office for evaluation of right lower extremity pain.  He has considerable discomfort in the morning when he first wakes up with discomfort into the right lower extremity.  He has not had any numbness or tingling.  He also has a little bit of a limp referable to "the hip area".  He denies any history of injury or trauma.  As the day progresses he actually feels better but might have some trouble the longer he stands of the further he walks with some achiness tightness in his right leg  HPI  Review of Systems  Constitutional: Negative for fatigue and fever.  HENT: Negative for ear pain.   Eyes: Negative for pain.  Respiratory: Negative for cough and shortness of breath.   Cardiovascular: Positive for leg swelling.  Gastrointestinal: Negative for constipation and diarrhea.  Genitourinary: Negative for difficulty  urinating.  Musculoskeletal: Negative for back pain and neck pain.  Skin: Negative for rash.  Allergic/Immunologic: Negative for food allergies.  Neurological: Positive for weakness. Negative for numbness.  Hematological: Does not bruise/bleed easily.  Psychiatric/Behavioral: Positive for sleep disturbance.     Objective: Vital Signs: BP 136/81 (BP Location: Right Arm, Patient Position: Sitting, Cuff Size: Normal)   Pulse 88   Ht 6\' 3"  (1.905 m)   Wt 203 lb (92.1 kg)   BMI 25.37 kg/m   Physical Exam  Constitutional: He is oriented to person, place, and time. He appears well-developed and well-nourished.  HENT:  Mouth/Throat: Oropharynx is clear and moist.  Eyes: Pupils are equal, round, and reactive to light. EOM are normal.  Pulmonary/Chest: Effort normal.  Neurological: He is alert and oriented to person, place, and time.  Skin: Skin is warm and dry.  Psychiatric: He has a normal mood and affect. His behavior is normal.    Ortho Exam awake alert and oriented x3.  Comfortable sitting.  Slight limp when he first gets up from a walking positions that seems to improve as he ambulates.  No pain with internal or external rotation of his right or left hip.  Straight leg raise was negative.  No percussible tenderness lumbar spine.  No distal edema.  Some mild  venous stasis changes distally in the leg.  +1 pulses.  Good capillary refill to his toes.  Motor exam appears to be intact  Specialty Comments:  No specialty comments available.  Imaging: Xr Hip Unilat W Or W/o Pelvis 2-3 Views Right  Result Date: 12/17/2017 AP the pelvis and lateral of the right hip are obtained.  There is significant degenerative change in the right hip compared to the left.  There is subchondral cysts and narrowing of the hip joint associated with osteophytes of the periphery of the femoral head.  There is no evidence of avascular necrosis.    PMFS History: Patient Active Problem List   Diagnosis Date  Noted  . Anemia 12/22/2016  . Prostate cancer ([place]) 12/16/2015  . Medication management 11/14/2013  . Vitamin D deficiency 02/12/2013  . History of elevated glucose 02/12/2013  . Elevated prostate specific antigen (PSA) 02/12/2013  . Labile hypertension 02/12/2013  . Hyperlipidemia   . Sinus bradycardia    Past Medical History:  Diagnosis Date  . Allergic rhinitis   . Cancer [facility])    prostate  . DJD (degenerative joint disease)   . Hypertension    Borderline   . Sinus bradycardia   . Vitamin D deficiency     Family History  Problem Relation Age of Onset  . Stroke Mother   . Diabetes Mother   . Heart attack Father 106  . Hypertension Father   . Diabetes Maternal Aunt   . Diabetes Paternal Aunt   . Cancer Maternal Grandmother     Past Surgical History:  Procedure Laterality Date  . APPENDECTOMY    . cervical neck mass   07/08/06   Left Excision left posterior cervical neck mass (2.5 cm). SURGEON:  Earnstine Regal, M.D., Allegra Grana 07/08/06 and 12/05/07  . FLEXIBLE SIGMOIDOSCOPY    . HEMORRHOID SURGERY    . JOINT REPLACEMENT     Left knee 2013  . KNEE SURGERY    . MASTOIDECTOMY Left    Social History   Occupational History  . Occupation: RETIRED    Employer: [employer]    Comment: PRESIDENT  Tobacco Use  . Smoking status: Never Smoker  . Smokeless tobacco: Never Used  Substance and Sexual Activity  . Alcohol use: Yes    Alcohol/week: 10.0 standard drinks    Types: 10 Glasses of wine per week  . Drug use: No  . Sexual activity: Not on file

## 2018-01-05 ENCOUNTER — Ambulatory Visit
Admission: RE | Admit: 2018-01-05 | Discharge: 2018-01-05 | Disposition: A | Discharge status: Home / Self Care | Payer: Medicare Other | Source: Ambulatory Visit | Attending: Orthopaedic Surgery | Admitting: Orthopaedic Surgery

## 2018-01-05 DIAGNOSIS — M48061 Spinal stenosis, lumbar region without neurogenic claudication: Secondary | ICD-10-CM | POA: Diagnosis not present

## 2018-01-05 DIAGNOSIS — G8929 Other chronic pain: Secondary | ICD-10-CM

## 2018-01-05 DIAGNOSIS — M545 Low back pain: Principal | ICD-10-CM

## 2018-01-10 ENCOUNTER — Encounter (INDEPENDENT_AMBULATORY_CARE_PROVIDER_SITE_OTHER): Payer: Self-pay | Admitting: Orthopaedic Surgery

## 2018-01-10 ENCOUNTER — Ambulatory Visit (INDEPENDENT_AMBULATORY_CARE_PROVIDER_SITE_OTHER): Payer: Medicare Other | Admitting: Orthopaedic Surgery

## 2018-01-10 VITALS — BP 126/83 | HR 97

## 2018-01-10 DIAGNOSIS — M542 Cervicalgia: Secondary | ICD-10-CM

## 2018-01-10 DIAGNOSIS — G8929 Other chronic pain: Secondary | ICD-10-CM

## 2018-01-10 DIAGNOSIS — M545 Low back pain, unspecified: Secondary | ICD-10-CM

## 2018-01-10 NOTE — Progress Notes (Signed)
Office Visit Note   Patient: Eugene Friedman           Date of Birth: 1928-06-26           MRN: [id number] Visit Date: 01/10/2018              Requested by: Eugene Friedman, [place] Friedman, Eugene Friedman PCP: Eugene Pinto, MD   Assessment & Plan: Visit Diagnoses:  1. Chronic midline low back pain, unspecified whether sciatica present   2. Low back pain, unspecified back pain laterality, unspecified chronicity, unspecified whether sciatica present     Plan: MRI scan demonstrates spinal cord edema in the area visualized on the MRI scan of the lumbar spine.  I discussed this with the radiologist who suggested that we obtain an MRI scan of the thoracic and cervical spine with and without contrast with the possibility that there may be pathology more proximally in addition to the levels of stenosis .  They thought that would be more prudent to perform prior to an epidural steroid injection.  He did have significant stenosis at L4-5 L3-4 and L2-3. We called Eugene Friedman and discussed the above.  We will schedule those studies prior to the epidural steroid injection Follow-Up Instructions: Return in about 1 month (around 02/09/2018).   Orders:  Orders Placed This Encounter  Procedures  . Ambulatory referral to Physical Medicine Rehab   No orders of the defined types were placed in this encounter.     Procedures: No procedures performed   Clinical Data: No additional findings.   Subjective: Chief Complaint  Patient presents with  . Lower Back - Pain, Follow-up    Patient is here to review MRI Report -L-spine  Eugene Friedman was seen earlier in the month for evaluation of right hip low back and bilateral lower extremity discomfort.  Consistent with spinal claudication.  His MRI scan demonstrates significant stenosis at L2-3 L3-4 and L4-5.  He does have weakness in both of his lower extremities when he is up and walking.  Sometimes he feels like he does not have a  lot of control of his legs.  He also had evidence of spinal cord edema.  I just discussed this MRI scan with the radiologist who felt that he should have an MRI of his cervical and thoracic spine to be sure that was not a lesion above the lumbar spine that could be contributing to his pain to any injection. His biggest problem is weakness of his legs.  He does have some arthritis in his right hip which will cause him to limp but is certainly tolerable.  HPI  Review of Systems   Objective: Vital Signs: BP 126/83   Pulse 97   Physical Exam  Constitutional: He is oriented to person, place, and time. He appears well-developed and well-nourished.  HENT:  Mouth/Throat: Oropharynx is clear and moist.  Eyes: Pupils are equal, round, and reactive to light. EOM are normal.  Pulmonary/Chest: Effort normal.  Neurological: He is alert and oriented to person, place, and time.  Skin: Skin is warm and dry.  Psychiatric: He has a normal mood and affect. His behavior is normal.    Ortho Exam awake alert and oriented x3.  Comfortable sitting.  Walks with a slight limp referable to his right hip that was previously diagnosed with arthritis.  He has atrophy of both of his legs.  Motor exam appears to be intact.  Straight leg raise negative.  No percussible tenderness  of lumbar spine  Specialty Comments:  No specialty comments available.  Imaging: No results found.   PMFS History: Patient Active Problem List   Diagnosis Date Noted  . Low back pain 01/10/2018  . Anemia 12/22/2016  . Prostate cancer ([place]) 12/16/2015  . Medication management 11/14/2013  . Vitamin D deficiency 02/12/2013  . History of elevated glucose 02/12/2013  . Elevated prostate specific antigen (PSA) 02/12/2013  . Labile hypertension 02/12/2013  . Hyperlipidemia   . Sinus bradycardia    Past Medical History:  Diagnosis Date  . Allergic rhinitis   . Cancer Eugene Friedman)    prostate  . DJD (degenerative joint disease)   .  Hypertension    Borderline   . Sinus bradycardia   . Vitamin D deficiency     Family History  Problem Relation Age of Onset  . Stroke Mother   . Diabetes Mother   . Heart attack Father 35  . Hypertension Father   . Diabetes Maternal Aunt   . Diabetes Paternal Aunt   . Cancer Maternal Grandmother     Past Surgical History:  Procedure Laterality Date  . APPENDECTOMY    . cervical neck mass   07/08/06   Left Excision left posterior cervical neck mass (2.5 cm). SURGEON:  Earnstine Regal, M.D., Allegra Grana 07/08/06 and 12/05/07  . FLEXIBLE SIGMOIDOSCOPY    . HEMORRHOID SURGERY    . JOINT REPLACEMENT     Left knee 2013  . KNEE SURGERY    . MASTOIDECTOMY Left    Social History   Occupational History  . Occupation: RETIRED    Employer: [employer]    Comment: PRESIDENT  Tobacco Use  . Smoking status: Never Smoker  . Smokeless tobacco: Never Used  Substance and Sexual Activity  . Alcohol use: Yes    Alcohol/week: 10.0 standard drinks    Types: 10 Glasses of wine per week  . Drug use: No  . Sexual activity: Not on file     Eugene Balding, MD   Note - This record has been created using [employer].  Chart creation errors have been sought, but may not always  have been located. Such creation errors do not reflect on  the standard of medical care.

## 2018-01-19 DIAGNOSIS — H353122 Nonexudative age-related macular degeneration, left eye, intermediate dry stage: Secondary | ICD-10-CM | POA: Diagnosis not present

## 2018-01-19 DIAGNOSIS — H524 Presbyopia: Secondary | ICD-10-CM | POA: Diagnosis not present

## 2018-01-19 DIAGNOSIS — H532 Diplopia: Secondary | ICD-10-CM | POA: Diagnosis not present

## 2018-01-19 DIAGNOSIS — H40013 Open angle with borderline findings, low risk, bilateral: Secondary | ICD-10-CM | POA: Diagnosis not present

## 2018-01-22 ENCOUNTER — Ambulatory Visit
Admission: RE | Admit: 2018-01-22 | Discharge: 2018-01-22 | Disposition: A | Discharge status: Home / Self Care | Payer: Medicare Other | Source: Ambulatory Visit | Attending: Orthopaedic Surgery | Admitting: Orthopaedic Surgery

## 2018-01-22 DIAGNOSIS — M4802 Spinal stenosis, cervical region: Secondary | ICD-10-CM | POA: Diagnosis not present

## 2018-01-22 DIAGNOSIS — M545 Low back pain: Principal | ICD-10-CM

## 2018-01-22 DIAGNOSIS — M542 Cervicalgia: Secondary | ICD-10-CM

## 2018-01-22 DIAGNOSIS — M5124 Other intervertebral disc displacement, thoracic region: Secondary | ICD-10-CM | POA: Diagnosis not present

## 2018-01-22 DIAGNOSIS — G8929 Other chronic pain: Secondary | ICD-10-CM

## 2018-01-22 DIAGNOSIS — M5021 Other cervical disc displacement,  high cervical region: Secondary | ICD-10-CM | POA: Diagnosis not present

## 2018-01-22 MED ORDER — GADOBENATE DIMEGLUMINE 529 MG/ML IV SOLN
10.0000 mL | Freq: Once | INTRAVENOUS | Status: AC | PRN
  Administered 2018-01-22: 10 mL via INTRAVENOUS

## 2018-01-24 ENCOUNTER — Other Ambulatory Visit (INDEPENDENT_AMBULATORY_CARE_PROVIDER_SITE_OTHER): Payer: Self-pay | Admitting: *Deleted

## 2018-01-24 ENCOUNTER — Telehealth (INDEPENDENT_AMBULATORY_CARE_PROVIDER_SITE_OTHER): Payer: Self-pay | Admitting: Orthopaedic Surgery

## 2018-01-24 ENCOUNTER — Telehealth: Payer: Self-pay | Admitting: *Deleted

## 2018-01-24 ENCOUNTER — Encounter (INDEPENDENT_AMBULATORY_CARE_PROVIDER_SITE_OTHER): Payer: Self-pay | Admitting: Orthopaedic Surgery

## 2018-01-24 ENCOUNTER — Ambulatory Visit (INDEPENDENT_AMBULATORY_CARE_PROVIDER_SITE_OTHER): Payer: Medicare Other | Admitting: Orthopaedic Surgery

## 2018-01-24 VITALS — BP 113/70 | HR 76 | Resp 16 | Ht 75.0 in | Wt 203.0 lb

## 2018-01-24 DIAGNOSIS — M545 Low back pain, unspecified: Secondary | ICD-10-CM

## 2018-01-24 DIAGNOSIS — G9519 Other vascular myelopathies: Secondary | ICD-10-CM

## 2018-01-24 NOTE — Telephone Encounter (Signed)
error 

## 2018-01-24 NOTE — Progress Notes (Signed)
Office Visit Note   Patient: Eugene Friedman           Date of Birth: December 18, 1928           MRN: [id number] Visit Date: 01/24/2018              Requested by: [name], MD [address] [place], [place] [address] PCP: [name], MD   Assessment & Plan: Visit Diagnoses:  1. Low back pain, unspecified back pain laterality, unspecified chronicity, unspecified whether sciatica present     Plan: MRI scan of lumbosacral spine revealed at least 3 areas of significant central stenosis.  There was also considerable cord edema.  The radiologist suggested we obtain an MRI scan of the cervical and thoracic spine with and without contrast.  This was performed demonstrating a serpiginous cord from about T7 distally.  This could be consistent with an AV fistula.  I discussed this with Dr. Louanne Skye who suggested I talk to 1 of the neurologist.  I did over the phone and they thought that there is certainly a possibility that all of the above could be consistent with a myelopathy and suggested that we make an appointment with 1 of the neurologist to evaluate that.  We have called we will try to get him in as soon as possible.  It is whether that is significant i.e. the findings at T7 or if it is the spinal stenosis creating neurogenic claudication  Follow-Up Instructions: Return in about 1 month (around 02/24/2018).   Orders:  No orders of the defined types were placed in this encounter.  No orders of the defined types were placed in this encounter.     Procedures: No procedures performed   Clinical Data: No additional findings.   Subjective: Chief Complaint  Patient presents with  . Neck - Pain  . Middle Back - Pain  . Neck Pain    MRI REVIEW  . Back Pain    MRI REVIEW  Eugene Friedman returns to the office for reevaluation of the problems having with his back and his lower extremities.  He notes that early in the morning he feels quite weak and that seems to improve  after breakfast.  However, when he is up and about and active we will have more back and leg pain with aching and feeling of weakness.  I did obtain an MRI scan demonstrating central stenosis at 3 of the 5 lumbar levels.  The radiologist was concerned about swelling of the spinal cord proximally and suggested we obtain MRI scan with and without contrast of the cervical and thoracic spine.  Cervical spine reveal some arthritis but otherwise negative.  This thoracic spine MRI scan demonstrated an area at T7 that could be consistent with an AV fistula.  I did check with the neurologist on-call at the hospital (Dr Kerney Elbe) who suggested we make an appointment for Eugene Friedman to see you a neurologist in the office with the possibility he could have a myelopathy related to the findings on the MRI scan.  HPI  Review of Systems  Constitutional: Positive for fatigue.  HENT: Positive for trouble swallowing.   Eyes: Negative for pain.  Respiratory: Negative for shortness of breath.   Cardiovascular: Positive for leg swelling.  Gastrointestinal: Positive for constipation and diarrhea.  Endocrine: Negative for cold intolerance.  Genitourinary: Negative for difficulty urinating.  Musculoskeletal: Positive for back pain, gait problem, neck pain and neck stiffness.  Skin: Positive for rash.  Allergic/Immunologic: Positive for food allergies.  Neurological: Positive for weakness.  Hematological: Does not bruise/bleed easily.  Psychiatric/Behavioral: Negative for sleep disturbance.     Objective: Vital Signs: BP 113/70 (BP Location: Left Arm, Patient Position: Sitting, Cuff Size: Normal)   Pulse 76   Resp 16   Ht 6\' 3"  (1.905 m)   Wt 203 lb (92.1 kg)   BMI 25.37 kg/m   Physical Exam  Constitutional: He is oriented to person, place, and time. He appears well-developed and well-nourished.  HENT:  Mouth/Throat: Oropharynx is clear and moist.  Eyes: Pupils are equal, round, and reactive to light.  EOM are normal.  Pulmonary/Chest: Effort normal.  Neurological: He is alert and oriented to person, place, and time.  Skin: Skin is warm and dry.  Psychiatric: He has a normal mood and affect. His behavior is normal.    Ortho Exam oriented x3.  Walks without ambulatory aid.  Exam is no different from that which was performed several weeks ago.  Straight leg raise is negative.  +1 pulses.  Does walk with a mild limp related to his right hip.  Does have some limitation of motion with evidence of degenerative change by plain film.  Specialty Comments:  No specialty comments available.  Imaging: No results found.   PMFS History: Patient Active Problem List   Diagnosis Date Noted  . Low back pain 01/10/2018  . Anemia 12/22/2016  . Prostate cancer ([place]) 12/16/2015  . Medication management 11/14/2013  . Vitamin D deficiency 02/12/2013  . History of elevated glucose 02/12/2013  . Elevated prostate specific antigen (PSA) 02/12/2013  . Labile hypertension 02/12/2013  . Hyperlipidemia   . Sinus bradycardia    Past Medical History:  Diagnosis Date  . Allergic rhinitis   . Cancer [facility])    prostate  . DJD (degenerative joint disease)   . Hypertension    Borderline   . Sinus bradycardia   . Vitamin D deficiency     Family History  Problem Relation Age of Onset  . Stroke Mother   . Diabetes Mother   . Heart attack Father 5  . Hypertension Father   . Diabetes Maternal Aunt   . Diabetes Paternal Aunt   . Cancer Maternal Grandmother     Past Surgical History:  Procedure Laterality Date  . APPENDECTOMY    . cervical neck mass   07/08/06   Left Excision left posterior cervical neck mass (2.5 cm). SURGEON:  Earnstine Regal, M.D., Allegra Grana 07/08/06 and 12/05/07  . FLEXIBLE SIGMOIDOSCOPY    . HEMORRHOID SURGERY    . JOINT REPLACEMENT     Left knee 2013  . KNEE SURGERY    . MASTOIDECTOMY Left    Social History   Occupational History  . Occupation: RETIRED    Employer: [employer]    Comment: PRESIDENT  Tobacco Use  . Smoking status: Never Smoker  . Smokeless tobacco: Never Used  Substance and Sexual Activity  . Alcohol use: Yes    Alcohol/week: 10.0 standard drinks    Types: 10 Glasses of wine per week  . Drug use: No  . Sexual activity: Not on file

## 2018-01-24 NOTE — Telephone Encounter (Signed)
thankst

## 2018-01-24 NOTE — Telephone Encounter (Signed)
FYI

## 2018-01-24 NOTE — Telephone Encounter (Signed)
Patient remembered an MRI he had years ago that they found a benign growth attached to his digestive track.

## 2018-01-27 ENCOUNTER — Ambulatory Visit (INDEPENDENT_AMBULATORY_CARE_PROVIDER_SITE_OTHER): Payer: Medicare Other | Admitting: Neurology

## 2018-01-27 ENCOUNTER — Encounter: Payer: Self-pay | Admitting: Neurology

## 2018-01-27 VITALS — BP 134/80 | HR 75 | Ht 75.0 in | Wt 203.0 lb

## 2018-01-27 DIAGNOSIS — M545 Low back pain, unspecified: Secondary | ICD-10-CM

## 2018-01-27 NOTE — Progress Notes (Signed)
Reason for visit: Spinal dural AVM  Referring physician: [name] is a 84 y.o. male  History of present illness:  Eugene Friedman is an 84 year old left-handed white male originally from [place] who is sent to this office for an evaluation of abnormal MRI findings from the lumbar and thoracic spine.  The patient indicates that 10 years ago he had onset of right thigh discomfort that gradually dissipated, he has gone a number of years without any pain or discomfort whatsoever.  Six weeks ago, he had onset of similar discomfort that goes from the right hip into the right thigh to the knee, he does not believe that there is any pain below the knee.  He reports that the pain is worse when he first gets up in the morning but as he moves about during the day, the pain dissipates and goes away.  The patient is able to continue walking half an hour daily as he has done over the last 2 years.  He does have some fatigue in the legs but this is no different from what it has been over the last 2 years.  The patient denies any weakness of the extremities, he denies issues controlling the bowels or the bladder.  He denies any severe gait problems or falls.  He has occasional numbness in the hands but he denies numbness in the legs.  He does have some low back pain.  MRI of the lumbar spine shows severe L4-5 spinal stenosis, with some moderate to severe stenosis at the L3-4 level as well.  MRI of the thoracic spine shows serpiginous structures that likely represent a dural AVM.  The patient is sent to this office for further evaluation regarding the above findings.  Past Medical History:  Diagnosis Date  . Allergic rhinitis   . Cancer [facility])    prostate  . DJD (degenerative joint disease)   . Hypertension    Borderline   . Sinus bradycardia   . Vitamin D deficiency     Past Surgical History:  Procedure Laterality Date  . APPENDECTOMY    . cervical neck mass   07/08/06   Left Excision  left posterior cervical neck mass (2.5 cm). SURGEON:  Earnstine Regal, M.D., Allegra Grana 07/08/06 and 12/05/07  . FLEXIBLE SIGMOIDOSCOPY    . HEMORRHOID SURGERY    . JOINT REPLACEMENT     Left knee 2013  . KNEE SURGERY    . MASTOIDECTOMY Left     Family History  Problem Relation Age of Onset  . Stroke Mother   . Diabetes Mother   . Heart attack Father 73  . Hypertension Father   . Diabetes Maternal Aunt   . Diabetes Paternal Aunt   . Cancer Maternal Grandmother     Social history:  reports that he has never smoked. He has never used smokeless tobacco. He reports current alcohol use of about 10.0 standard drinks of alcohol per week. He reports that he does not use drugs.  Medications:  Prior to Admission medications   Medication Sig Start Date End Date Taking? Authorizing Provider  aspirin EC 81 MG tablet Take 81 mg by mouth daily.   Yes [provider]  Cholecalciferol (VITAMIN D-3) 5000 UNITS TABS Take by mouth.   Yes [provider]  dicyclomine (BENTYL) 20 MG tablet Take 1 tablet 3 x/ day before meals for bloating, cramping, or diarrhea Patient taking differently: as needed. Take 1 tablet 3 x/ day before  meals for bloating, cramping, or diarrhea 12/08/17  Yes Liane Comber, NP  doxazosin (CARDURA) 8 MG tablet Take 1/2 to 1 tablet at Bedtime for Prostate & BP 05/19/17  Yes Vicie Mutters, PA-C  finasteride (PROSCAR) 5 MG tablet Take 1 tablet (5 mg total) by mouth daily. 05/19/17  Yes Vicie Mutters, PA-C  hyoscyamine (LEVSIN SL) 0.125 MG SL tablet DISSOLVE 1 TO 2 TABLETS UNDER THE TONGUE FOUR TIMES DAILY AS NEEDED FOR ABDOMINAL CRAMPS OR NAUSEA OR DIARRHEA 12/08/17  Yes Liane Comber, NP  Krill Oil 1000 MG CAPS Take 1,000 mg by mouth daily.   Yes [provider]  LUTEIN PO Take 5 mg by mouth 2 (two) times daily.   Yes [provider]  neomycin-polymyxin-hydrocortisone (CORTISPORIN) 3.5-10000-1 OTIC suspension Place 4 drops into both ears 4 (four) times  daily. Patient taking differently: Place 4 drops into both ears 4 (four) times daily as needed.  12/08/17  Yes Liane Comber, NP  tadalafil (CIALIS) 10 MG tablet 1/2-1 pill daily as directed. 05/19/17  Yes Vicie Mutters, PA-C  bisoprolol (ZEBETA) 5 MG tablet Take 1/2 to 1 tablet daily for BP and Pulse Rate Patient taking differently: as needed. Take 1/2 to 1 tablet daily for BP and Pulse Rate 05/19/17 12/17/17  Vicie Mutters, PA-C      Allergies  Allergen Reactions  . Adhesive [Tape]   . Blueberry Flavor   . Celebrex [Celecoxib] Other (See Comments)    GI upset  . Lipitor [Atorvastatin]   . Nickel   . Ppd [Tuberculin Purified Protein Derivative] Other (See Comments)    Positive PPD    ROS:  Out of a complete 14 system review of symptoms, the patient complains only of the following symptoms, and all other reviewed systems are negative.  Fatigue Swelling in the legs Hearing loss Diarrhea, constipation Easy bleeding Aching muscles  Blood pressure 134/80, pulse 75, height 6\' 3"  (1.905 m), weight 203 lb (92.1 kg).  Physical Exam  General: The patient is alert and cooperative at the time of the examination.  Eyes: Pupils are equal, round, and reactive to light. Discs are flat bilaterally.  Neck: The neck is supple, no carotid bruits are noted.  Respiratory: The respiratory examination is clear.  Cardiovascular: The cardiovascular examination reveals a regular rate and rhythm, no obvious murmurs or rubs are noted.  Skin: Extremities are without significant edema.  Neurologic Exam  Mental status: The patient is alert and oriented x 3 at the time of the examination. The patient has apparent normal recent and remote memory, with an apparently normal attention span and concentration ability.  Cranial nerves: Facial symmetry is present. There is good sensation of the face to pinprick and soft touch bilaterally. The strength of the facial muscles and the muscles to head turning  and shoulder shrug are normal bilaterally. Speech is well enunciated, no aphasia or dysarthria is noted. Extraocular movements are full. Visual fields are full. The tongue is midline, and the patient has symmetric elevation of the soft palate. No obvious hearing deficits are noted.  Motor: The motor testing reveals 5 over 5 strength of all 4 extremities. Good symmetric motor tone is noted throughout.  Sensory: Sensory testing is intact to pinprick, soft touch, vibration sensation, and position sense on all 4 extremities, with exception of some impairment of position sense in both feet. No evidence of extinction is noted.  Coordination: Cerebellar testing reveals good finger-nose-finger and heel-to-shin bilaterally.  Gait and station: Gait is normal. Tandem gait  is slightly unsteady.  Romberg is negative. No drift is seen.  The patient is able to walk on the heels and the toes bilaterally.  Reflexes: Deep tendon reflexes are symmetric, but are depressed bilaterally. Toes are downgoing bilaterally.   MRI cervical and thoracic 01/22/18:  IMPRESSION: MRI CERVICAL SPINE IMPRESSION:  1. Normal MRI appearance of the cervical spinal cord. 2. Degenerative disc osteophyte at C5-6 with resultant mild spinal stenosis. No other significant canal stenosis within the cervical spine. 3. Multifactorial degenerative changes with resultant multilevel foraminal narrowing as above. Notable findings include severe right with moderate left C4 foraminal stenosis, with moderate to severe bilateral C5 and C6 foraminal narrowing.  MRI THORACIC SPINE IMPRESSION:  1. Abnormal cord signal intensity extending from T7 inferiorly through the conus with associated multiple serpiginous vascular flow voids along the dorsal surface of the cord. Constellation of findings most consistent with a spinal dural AVF. 2. Disc bulging with facet hypertrophy at T10-11 and T11-12 with resultant moderate left foraminal narrowing  at T10-11, with more moderate to severe left foraminal narrowing at T11-12. 3. Additional fairly mild degenerative changes for patient age as detailed above. No other significant stenosis within the thoracic Spine.  * MRI scan images were reviewed online. I agree with the written report.   MRI lumbar 01/05/18:  IMPRESSION: 1. Spinal cord edema. Recommend MRI of the cervical and thoracic spine with and without contrast. 2. Grade 1 L5-S1 anterolisthesis without spondylolysis or acute osseous process. 3. Severe canal stenosis L4-5, moderate to severe at L3-4 and moderate L2-3. 4. Neural foraminal narrowing L1-2 through L5-S1: Severe on the LEFT at L5-S1.     Assessment/Plan:  1.  Probable spinal dural AVM  2.  Lumbosacral spinal stenosis  3.  Right leg discomfort  The patient is having some right leg discomfort that could be consistent with a facet joint arthritis issue or possibly a radiculopathy.  The patient is not having any weakness or significant sensory changes in the lower extremities suggestive of a spinal cord injury.  The patient has advanced age, even though a fairly large spinal dural AVM is apparent on MRI, I am not clear that this is resulting in any symptoms of spinal cord ischemia.  I believe that the best option for this patient is conservative management, and following the AVM over time.  The patient does develop symptoms that are consistent with a myelopathy, proceeding with angiography and possible ablation may be considered at that time but these procedures and treatment modalities do have a risk of spinal cord ischemia.  His current symptoms are likely from the lumbar spine, not from the AVM.  Jill Alexanders MD 01/27/2018 1:54 PM  [facility] [address] [place], [place] [address]  Phone [phone] Fax [phone]

## 2018-02-02 ENCOUNTER — Ambulatory Visit: Payer: Self-pay | Admitting: Adult Health

## 2018-02-03 NOTE — Progress Notes (Signed)
FOLLOW UP  Assessment and Plan:   Hypertension Currently well controlled with current medications based on home logs; patient may split the beta blocker and take BID if concerned of elevated evening BP readings Monitor blood pressure at home; patient to call if consistently greater than 130/80 Continue DASH diet.   Reminder to go to the ER if any CP, SOB, nausea, dizziness, severe HA, changes vision/speech, left arm numbness and tingling and jaw pain.  Cholesterol Currently still above target  Discussed low cholesterol diet and exercise.  Check lipid panel.   History of elevated glucose Continue diet and exercise.  Perform daily foot/skin check, notify office of any concerning changes.  Check A1C  Vitamin D Def/ osteoporosis prevention Continue supplementation Check vitamin D level  BMI 25 Continue to recommend diet heavy in fruits and veggies and low in animal meats, cheeses, and dairy products, appropriate calorie intake Discuss exercise recommendations routinely Continue to monitor weight at each visit  Spinal stenosis/possible dural AVF Encouraged follow up with Dr. Durward Fortes as recommended  Continue diet and meds as discussed. Further disposition pending results of labs. Discussed med's effects and SE's.   Over 30 minutes of exam, counseling, chart review, and critical decision making was performed.   Future Appointments  Date Time Provider [place]  05/04/2018 11:00 AM [name], MD [facility] None    ----------------------------------------------------------------------------------------------------------------------  HPI 84 y.o. male  presents for 3 month follow up on hypertension, cholesterol, history of elevated glucose and vitamin D deficiency. Patient also has Gleason 6 prostate followed by active surveillance (Dr Gerald Stabs Lovena Neighbours).   He is leaving for [place] in a few weeks and requesting refill for medications and abx should he develop a URI as  he commonly does after travel.   He has been c/o R posterior high aching daily in AM and was referred to Dr. Durward Fortes Per his most recent note on 01/24/2018:   MRI scan of lumbosacral spine revealed at least 3 areas of significant central stenosis.  There was also considerable cord edema.  The radiologist suggested we obtain an MRI scan of the cervical and thoracic spine with and without contrast.  This was performed demonstrating a serpiginous cord from about T7 distally.  This could be consistent with an AV fistula.  I discussed this with Dr. Louanne Skye who suggested I talk to 1 of the neurologist.  I did over the phone and they thought that there is certainly a possibility that all of the above could be consistent with a myelopathy and suggested that we make an appointment with 1 of the neurologist to evaluate that.  We have called we will try to get him in as soon as possible.  It is whether that is significant i.e. the findings at T7 or if it is the spinal stenosis creating neurogenic claudication  Per pt surgery was discussed but has been declined due to very high risk of complication. He feels his pain is manageable at this time.   BMI is Body mass index is 25.75 kg/m., he has been working on diet and exercise. Wt Readings from Last 3 Encounters:  02/04/18 206 lb (93.4 kg)  01/27/18 203 lb (92.1 kg)  01/24/18 203 lb (92.1 kg)   He has recently been seen frequently for labile hypertension with several medication changes;  His blood pressure has been controlled at home (120-130s over 70s on daily log), today their BP is BP: 120/78  He does workout. He denies chest pain, shortness of breath, dizziness.  He had negative cardiolite in 2011   He is not on cholesterol medication (currently on fish oil supplement). His cholesterol is not at goal. The cholesterol last visit was:   Lab Results  Component Value Date   CHOL 207 (H) 05/19/2017   HDL 68 05/19/2017   LDLCALC 118 (H) 05/19/2017   TRIG  103 05/19/2017   CHOLHDL 3.0 05/19/2017    He has been working on diet and exercise for glucose management, and denies increased appetite, nausea, paresthesia of the feet, polydipsia and polyuria. Last A1C in the office was:  Lab Results  Component Value Date   HGBA1C 5.2 11/02/2017   Patient is on Vitamin D supplement and at goal Lab Results  Component Value Date   VD25OH 50 11/02/2017        Current Medications:  Current Outpatient Medications on File Prior to Visit  Medication Sig  . aspirin EC 81 MG tablet Take 81 mg by mouth daily.  . bisoprolol (ZEBETA) 5 MG tablet Take 1/2 to 1 tablet daily for BP and Pulse Rate (Patient taking differently: as needed. Take 1/2 to 1 tablet daily for BP and Pulse Rate)  . Cholecalciferol (VITAMIN D-3) 5000 UNITS TABS Take by mouth.  . dicyclomine (BENTYL) 20 MG tablet Take 1 tablet 3 x/ day before meals for bloating, cramping, or diarrhea (Patient taking differently: as needed. Take 1 tablet 3 x/ day before meals for bloating, cramping, or diarrhea)  . doxazosin (CARDURA) 8 MG tablet Take 1/2 to 1 tablet at Bedtime for Prostate & BP  . finasteride (PROSCAR) 5 MG tablet Take 1 tablet (5 mg total) by mouth daily.  . hyoscyamine (LEVSIN SL) 0.125 MG SL tablet DISSOLVE 1 TO 2 TABLETS UNDER THE TONGUE FOUR TIMES DAILY AS NEEDED FOR ABDOMINAL CRAMPS OR NAUSEA OR DIARRHEA  . Krill Oil 1000 MG CAPS Take 1,000 mg by mouth daily.  . LUTEIN PO Take 5 mg by mouth 2 (two) times daily.  Marland Kitchen neomycin-polymyxin-hydrocortisone (CORTISPORIN) 3.5-10000-1 OTIC suspension Place 4 drops into both ears 4 (four) times daily. (Patient taking differently: Place 4 drops into both ears 4 (four) times daily as needed. )  . tadalafil (CIALIS) 10 MG tablet 1/2-1 pill daily as directed.   No current facility-administered medications on file prior to visit.      Allergies:  Allergies  Allergen Reactions  . Adhesive [Tape]   . Blueberry Flavor   . Celebrex [Celecoxib] Other  (See Comments)    GI upset  . Lipitor [Atorvastatin]   . Nickel   . Ppd [Tuberculin Purified Protein Derivative] Other (See Comments)    Positive PPD     Medical History:  Past Medical History:  Diagnosis Date  . Allergic rhinitis   . Cancer [facility])    prostate  . DJD (degenerative joint disease)   . Hypertension    Borderline   . Sinus bradycardia   . Vitamin D deficiency    Family history- Reviewed and unchanged Social history- Reviewed and unchanged   Review of Systems:  Review of Systems  Constitutional: Negative for malaise/fatigue and weight loss.  HENT: Negative for hearing loss and tinnitus.   Eyes: Negative for blurred vision and double vision.  Respiratory: Negative for cough, shortness of breath and wheezing.   Cardiovascular: Negative for chest pain, palpitations, orthopnea, claudication and leg swelling.  Gastrointestinal: Negative for abdominal pain, blood in stool, constipation, diarrhea, heartburn, melena, nausea and vomiting.  Genitourinary: Negative.   Musculoskeletal: Negative for falls, joint  pain and myalgias.  Skin: Negative for rash.  Neurological: Negative for dizziness, tingling, sensory change, weakness and headaches.  Endo/Heme/Allergies: Negative for polydipsia.  Psychiatric/Behavioral: Negative.   All other systems reviewed and are negative.   Physical Exam: BP 120/78   Pulse (!) 55   Temp 97.7 F (36.5 C)   Ht 6\' 3"  (1.905 m)   Wt 206 lb (93.4 kg)   SpO2 98%   BMI 25.75 kg/m  Wt Readings from Last 3 Encounters:  02/04/18 206 lb (93.4 kg)  01/27/18 203 lb (92.1 kg)  01/24/18 203 lb (92.1 kg)   General Appearance: Well nourished, in no apparent distress. Eyes: PERRLA, EOMs, conjunctiva no swelling or erythema Sinuses: No Frontal/maxillary tenderness ENT/Mouth: Bilateral external auditory canals inflamed without discharge, TMs without erythema, bulging. No erythema, swelling, or exudate on post pharynx.  Tonsils not swollen or  erythematous. Hearing normal.  Neck: Supple, thyroid normal.  Respiratory: Respiratory effort normal, BS equal bilaterally without rales, rhonchi, wheezing or stridor.  Cardio: RRR with no MRGs. Brisk peripheral pulses without edema.  Abdomen: Soft, + BS.  Non tender, no guarding, rebound, hernias, masses. Lymphatics: Non tender without lymphadenopathy.  Musculoskeletal: Full ROM, 5/5 strength, Normal gait Skin: Warm, dry without rashes, lesions, ecchymosis.  Neuro: Cranial nerves intact. No cerebellar symptoms.  Psych: Awake and oriented X 3, normal affect, Insight and Judgment appropriate.    [name], NP 8:58 AM [facility]

## 2018-02-04 ENCOUNTER — Encounter: Payer: Self-pay | Admitting: Adult Health

## 2018-02-04 ENCOUNTER — Ambulatory Visit (INDEPENDENT_AMBULATORY_CARE_PROVIDER_SITE_OTHER): Payer: Medicare Other | Admitting: Adult Health

## 2018-02-04 VITALS — BP 120/78 | HR 55 | Temp 97.7°F | Ht 75.0 in | Wt 206.0 lb

## 2018-02-04 DIAGNOSIS — Z79899 Other long term (current) drug therapy: Secondary | ICD-10-CM

## 2018-02-04 DIAGNOSIS — K589 Irritable bowel syndrome without diarrhea: Secondary | ICD-10-CM | POA: Diagnosis not present

## 2018-02-04 DIAGNOSIS — R0989 Other specified symptoms and signs involving the circulatory and respiratory systems: Secondary | ICD-10-CM | POA: Diagnosis not present

## 2018-02-04 DIAGNOSIS — I671 Cerebral aneurysm, nonruptured: Secondary | ICD-10-CM | POA: Diagnosis not present

## 2018-02-04 DIAGNOSIS — M4800 Spinal stenosis, site unspecified: Secondary | ICD-10-CM

## 2018-02-04 DIAGNOSIS — E559 Vitamin D deficiency, unspecified: Secondary | ICD-10-CM

## 2018-02-04 DIAGNOSIS — E782 Mixed hyperlipidemia: Secondary | ICD-10-CM | POA: Diagnosis not present

## 2018-02-04 DIAGNOSIS — M48061 Spinal stenosis, lumbar region without neurogenic claudication: Secondary | ICD-10-CM | POA: Insufficient documentation

## 2018-02-04 DIAGNOSIS — Z6825 Body mass index (BMI) 25.0-25.9, adult: Secondary | ICD-10-CM | POA: Diagnosis not present

## 2018-02-04 DIAGNOSIS — Z23 Encounter for immunization: Secondary | ICD-10-CM

## 2018-02-04 DIAGNOSIS — Z8639 Personal history of other endocrine, nutritional and metabolic disease: Secondary | ICD-10-CM | POA: Diagnosis not present

## 2018-02-04 DIAGNOSIS — K582 Mixed irritable bowel syndrome: Secondary | ICD-10-CM | POA: Diagnosis not present

## 2018-02-04 LAB — CBC WITH DIFFERENTIAL/PLATELET
Absolute Monocytes: 462 cells/uL (ref 200–950)
Basophils Absolute: 29 cells/uL (ref 0–200)
Basophils Relative: 0.7 %
Eosinophils Absolute: 160 cells/uL (ref 15–500)
Eosinophils Relative: 3.8 %
HCT: 41.2 % (ref 38.5–50.0)
HEMOGLOBIN: 13.7 g/dL (ref 13.2–17.1)
Lymphs Abs: 945 cells/uL (ref 850–3900)
MCH: 30.6 pg (ref 27.0–33.0)
MCHC: 33.3 g/dL (ref 32.0–36.0)
MCV: 92 fL (ref 80.0–100.0)
MONOS PCT: 11 %
MPV: 10 fL (ref 7.5–12.5)
NEUTROS ABS: 2604 {cells}/uL (ref 1500–7800)
Neutrophils Relative %: 62 %
Platelets: 197 10*3/uL (ref 140–400)
RBC: 4.48 10*6/uL (ref 4.20–5.80)
RDW: 12.9 % (ref 11.0–15.0)
Total Lymphocyte: 22.5 %
WBC: 4.2 10*3/uL (ref 3.8–10.8)

## 2018-02-04 LAB — COMPLETE METABOLIC PANEL WITH GFR
AG Ratio: 1.7 (calc) (ref 1.0–2.5)
ALBUMIN MSPROF: 4.2 g/dL (ref 3.6–5.1)
ALKALINE PHOSPHATASE (APISO): 68 U/L (ref 40–115)
ALT: 14 U/L (ref 9–46)
AST: 17 U/L (ref 10–35)
BUN/Creatinine Ratio: 16 (calc) (ref 6–22)
BUN: 25 mg/dL (ref 7–25)
CO2: 25 mmol/L (ref 20–32)
Calcium: 9.2 mg/dL (ref 8.6–10.3)
Chloride: 105 mmol/L (ref 98–110)
Creat: 1.52 mg/dL — ABNORMAL HIGH (ref 0.70–1.11)
GFR, Est African American: 47 mL/min/{1.73_m2} — ABNORMAL LOW (ref >=60)
GFR, Est Non African American: 41 mL/min/{1.73_m2} — ABNORMAL LOW (ref >=60)
Globulin: 2.5 g/dL (calc) (ref 1.9–3.7)
Glucose, Bld: 111 mg/dL — ABNORMAL HIGH (ref 65–99)
Potassium: 4.6 mmol/L (ref 3.5–5.3)
Sodium: 139 mmol/L (ref 135–146)
Total Bilirubin: 0.7 mg/dL (ref 0.2–1.2)
Total Protein: 6.7 g/dL (ref 6.1–8.1)

## 2018-02-04 LAB — LIPID PANEL
Cholesterol: 213 mg/dL — ABNORMAL HIGH (ref <200)
HDL: 68 mg/dL (ref >40)
LDL Cholesterol (Calc): 122 mg/dL (calc) — ABNORMAL HIGH
Non-HDL Cholesterol (Calc): 145 mg/dL (calc) — ABNORMAL HIGH (ref <130)
Total CHOL/HDL Ratio: 3.1 (calc) (ref <5.0)
Triglycerides: 119 mg/dL (ref <150)

## 2018-02-04 LAB — TSH: TSH: 2.95 mIU/L (ref 0.40–4.50)

## 2018-02-04 MED ORDER — FINASTERIDE 5 MG PO TABS: 5.0000 mg | ORAL_TABLET | Freq: Every day | ORAL | 2 refills | Status: DC

## 2018-02-04 MED ORDER — HYOSCYAMINE SULFATE 0.125 MG SL SUBL: SUBLINGUAL_TABLET | SUBLINGUAL | 1 refills | Status: DC

## 2018-02-04 MED ORDER — AZITHROMYCIN 250 MG PO TABS: ORAL_TABLET | ORAL | 1 refills | Status: AC

## 2018-02-04 MED ORDER — DICYCLOMINE HCL 20 MG PO TABS: ORAL_TABLET | ORAL | 3 refills | Status: DC

## 2018-02-04 MED ORDER — PREDNISONE 20 MG PO TABS: ORAL_TABLET | ORAL | 0 refills | Status: DC

## 2018-02-04 NOTE — Patient Instructions (Addendum)
Goals    . Blood Pressure < 140/80        A spinal dural arteriovenous fistula (SDAVF) is an abnormal connection between arteries and veins in the dura, the outer lining of the spinal cord. Arteries and veins are the two types of major blood vessels in the body. Arteries carry oxygen-rich blood to the body's tissues, and veins bring oxygen-depleted blood back to the heart and lungs. Arteries are usually under high pressure, since the blood inside these vessels has recently been pumped forcefully from the heart. Veins are usually under lower pressure. Normally, arteries and veins are connected by tiny vessels called capillaries. Capillaries handle the transition from higher to lower pressure, help supply nutrients from the blood to surrounding tissues, and help move waste products from the tissues to the blood. But in an SDAVF, the capillaries are missing. Arteries and veins are directly connected to each other. The direct connection between artery and vein is called a fistula. This abnormal connection leads to a number of problems. For example, without capillaries, high-pressure arterial blood rushes directly into veins. Veins are not equipped to handle this influx. They can swell and bloat, and blood flow through them can become congested. This leads to swelling, which places harmful pressure on the spinal cord. In addition, without capillaries to handle nutrient exchange, the tissue around the SDAVF can have difficulty receiving nutrients like oxygen and glucose. Any of these problems with blood flow, pressure, and nutrient delivery can cause injury to the tissues around the SDAVF. Tissue death in these areas can cause neurological dysfunction. For information about abnormal tangles of blood vessels inside the spinal cord, see our page on spinal arteriovenous malformations (spinal AVMs). Symptoms Symptoms of SDAVF are generally nonspecific, meaning they are similar to symptoms of many other problems  that affect the spinal cord. These symptoms may include back pain, numbness, weakness, or "pins and needles" in the legs, clumsiness, difficulty walking or climbing stairs, impairment in bladder or bowel function, and sexual dysfunction. Symptoms may develop slowly and steadily, or they may progress and then stay the same for a while before progressing again. Many patients have symptoms for a year or more before the SDAVF is diagnosed. Causes and Risk Factors The cause of SDAVF is not yet known. SDAVFs are more common in men than in women, and most common in older adults. SDAVFs usually occur in the mid- to lower spine (the thoracic and lumbar spine). Tests and Diagnosis SDAVFs are usually identified by a MR (magnetic resonance) imaging scan. This procedure uses large magnets, radiofrequencies, and a computer to produce detailed images of the spinal cord. However, even an MR image does not reveal the location of the SDAVF itself, just effects on the spinal cord-like swelling and enlarged blood vessels-that imply a SDAVF may be present. Once a diagnosis of SDAVF has been suggested by MR imaging, an angiogram will make it possible to identify the exact location of the fistula itself. In an angiogram, a radiologist injects a dye into the blood vessels. X-ray or MRI scans will then be taken to show the specifics of the blood flow. Because of the complexity of SDAVF and of the problems it may cause, several injections and scans may be necessary. Treatments Most patients do quite well with treatment. Left untreated, however, patients may experience progressive spinal cord dysfunction and ultimately paralysis. Therefore, in the overwhelming majority of cases, treatment is recommended. SDAVFs are often treated with a procedure called endovascular embolization. This technique  does not require surgery and involves passing a tiny catheter, or tube, into the vessels feeding into the fistula. Once in place, the  catheter releases a glue-like material or tiny particles that can seal off the fistula. However, for reasons that have to do with blood flow and vessel anatomy, this procedure may not be effective in every case. In some cases, surgery may be necessary. Using a surgical microscope and extremely fine instruments, neurosurgeons can microsurgically remove the malformation and restore the normal blood flow to the spinal cord. Spinal dural arteriovenous malformations are complex problems. They should be addressed at major medical centers by professionals-like those at [facility] are experienced in their treatment.   Information obtained from:   [email or web address]

## 2018-02-14 ENCOUNTER — Telehealth (INDEPENDENT_AMBULATORY_CARE_PROVIDER_SITE_OTHER): Payer: Self-pay | Admitting: *Deleted

## 2018-02-14 NOTE — Telephone Encounter (Signed)
Received call from [facility] needing to get clarification on an order that was placed back in November 2019, states she is not sure if ordering provider wanted to do Myelogram of the TSP or CSP. Pt has already had the MRI done, we have come to conclusion this was not needed since there was nothing in notes has to needing to have done. Angelita Ingles cancelled the order with me agreeing.

## 2018-03-24 ENCOUNTER — Encounter (INDEPENDENT_AMBULATORY_CARE_PROVIDER_SITE_OTHER): Payer: Self-pay | Admitting: Orthopaedic Surgery

## 2018-03-24 ENCOUNTER — Encounter: Payer: Self-pay | Admitting: Physician Assistant

## 2018-03-24 ENCOUNTER — Ambulatory Visit (INDEPENDENT_AMBULATORY_CARE_PROVIDER_SITE_OTHER): Payer: Medicare Other | Admitting: Orthopaedic Surgery

## 2018-03-24 VITALS — BP 137/82 | HR 80 | Ht 74.5 in | Wt 207.0 lb

## 2018-03-24 DIAGNOSIS — M545 Low back pain, unspecified: Secondary | ICD-10-CM

## 2018-03-24 DIAGNOSIS — M25551 Pain in right hip: Secondary | ICD-10-CM | POA: Diagnosis not present

## 2018-03-24 NOTE — Progress Notes (Deleted)
Office Visit Note   Patient: Eugene Friedman           Date of Birth: 11-Jan-1929           MRN: [id number] Visit Date: 03/24/2018              Requested by: [name], [place],  [address] PCP: [name], MD   Assessment & Plan: Visit Diagnoses: No diagnosis found.  Plan: ***  Follow-Up Instructions: No follow-ups on file.   Orders:  No orders of the defined types were placed in this encounter.  No orders of the defined types were placed in this encounter.     Procedures: No procedures performed   Clinical Data: No additional findings.   Subjective: Chief Complaint  Patient presents with  . Spine - Follow-up  Patient presents today for a 58month follow up for his back pain. He had MRIs on his C-Spine and T-Spine on 01/22/18. He has no complaint today and said that he is feeling well.    HPI  Review of Systems  Constitutional: Negative for fatigue.  HENT: Negative for ear pain.   Eyes: Negative for pain.  Respiratory: Negative for shortness of breath.   Cardiovascular: Positive for leg swelling.  Gastrointestinal: Negative for constipation and diarrhea.  Endocrine: Negative for cold intolerance and heat intolerance.  Genitourinary: Negative for difficulty urinating.  Musculoskeletal: Negative for joint swelling.  Skin: Negative for rash.  Allergic/Immunologic: Negative for food allergies.  Neurological: Negative for speech difficulty.  Hematological: Does not bruise/bleed easily.  Psychiatric/Behavioral: Negative for sleep disturbance.     Objective: Vital Signs: There were no vitals taken for this visit.  Physical Exam  Ortho Exam  Specialty Comments:  No specialty comments available.  Imaging: No results found.   PMFS History: Patient Active Problem List   Diagnosis Date Noted  . Spinal stenosis 02/04/2018  . Low back pain 01/10/2018  . Prostate cancer ([place]) 12/16/2015  . BMI  25.0-25.9,adult 11/20/2014  . Medication management 11/14/2013  . Vitamin D deficiency 02/12/2013  . History of elevated glucose 02/12/2013  . Elevated prostate specific antigen (PSA) 02/12/2013  . Labile hypertension 02/12/2013  . Hyperlipidemia   . Sinus bradycardia    Past Medical History:  Diagnosis Date  . Allergic rhinitis   . Cancer [facility])    prostate  . DJD (degenerative joint disease)   . Hypertension    Borderline   . Sinus bradycardia   . Vitamin D deficiency     Family History  Problem Relation Age of Onset  . Stroke Mother   . Diabetes Mother   . Heart attack Father 61  . Hypertension Father   . Diabetes Maternal Aunt   . Diabetes Paternal Aunt   . Cancer Maternal Grandmother     Past Surgical History:  Procedure Laterality Date  . APPENDECTOMY    . cervical neck mass   07/08/06   Left Excision left posterior cervical neck mass (2.5 cm). SURGEON:  Earnstine Regal, M.D., Allegra Grana 07/08/06 and 12/05/07  . FLEXIBLE SIGMOIDOSCOPY    . HEMORRHOID SURGERY    . JOINT REPLACEMENT     Left knee 2013  . KNEE SURGERY    . MASTOIDECTOMY Left    Social History   Occupational History  . Occupation: RETIRED    Employer: [employer]    Comment: PRESIDENT  Tobacco Use  . Smoking status: Never Smoker  . Smokeless tobacco: Never  Used  Substance and Sexual Activity  . Alcohol use: Yes    Alcohol/week: 10.0 standard drinks    Types: 10 Glasses of wine per week  . Drug use: No  . Sexual activity: Not on file

## 2018-03-24 NOTE — Progress Notes (Signed)
Office Visit Note   Patient: Eugene Friedman           Date of Birth: September 07, 1928           MRN: [id number] Visit Date: 03/24/2018              Requested by: [name], [place], [place] [address] PCP: [name], MD   Assessment & Plan: Visit Diagnoses:  1. Low back pain, unspecified back pain laterality, unspecified chronicity, unspecified whether sciatica present   2. Pain of right hip joint     Plan: Discussion regarding MRI scan findings of cervical thoracic and lumbar spine.  There was a concern about an AV malformation in the area of T7 directed inferiorly.  He did see Dr. Natasha Bence neurology who felt that his symptoms were not related to the AV malformation but thought it would be worth having it monitored.  He thought that the problem is more likely related to the arthritis in the lower lumbar spine and his spinal stenosis.  Those symptoms included difficulty with pain and feeling of heaviness in his back buttock and both legs with prolonged standing or ambulation.  That actually has abated recently and is feeling better.  There is also evidence of significant osteoarthritis of his right hip that causes him to limp but overall he seems to do doing very well at this point.  I just had a little review of all of the above will help him decide what he wants to do over time.  That would include on a continual conservative treatment basis injecting his back with an epidural steroid injection or his right hip.  He also has had what may be some early neuropathy both of his feet with a burning sensation but not to the point where he wants to have it treated.  We also talked about physical therapy and I think it would be worthwhile to do that he does have some issues with balance and they can work on that as well as he is aware that he does not need to be falling.  We will plan to see him back as needed.  He will keep in touch with the neurologist  and his primary care physician  Follow-Up Instructions: Return if symptoms worsen or fail to improve.   Orders:  Orders Placed This Encounter  Procedures  . Ambulatory referral to Physical Therapy   No orders of the defined types were placed in this encounter.     Procedures: No procedures performed   Clinical Data: No additional findings.   Subjective: Chief Complaint  Patient presents with  . Spine - Follow-up  Last evaluated in December 2019.  He spent about a month in [place] notes that he is actually feeling a little better in terms of his back buttock and lower extremities problem that he was having previously.  He also has been seen by Dr. Jannifer Franklin for evaluation of the AVN of the thoracic spine noted by MRI scan.  After Jannifer Franklin did not think he had a myelopathy related to that but it was worth monitoring over time.  Presently Mr. Kovar is relatively comfortable and does not appear to be compromising his activities of daily living  HPI  Review of Systems   Objective: Vital Signs: BP 137/82   Pulse 80   Ht 6' 2.5" (1.892 m)   Wt 207 lb (93.9 kg)   BMI 26.22 kg/m   Physical Exam Constitutional:  Appearance: He is well-developed.  Eyes:     Pupils: Pupils are equal, round, and reactive to light.  Pulmonary:     Effort: Pulmonary effort is normal.  Skin:    General: Skin is warm and dry.  Neurological:     Mental Status: He is alert and oriented to person, place, and time.  Psychiatric:        Behavior: Behavior normal.     Ortho Exam awake alert and oriented x3.  Comfortable sitting straight leg raise is negative.  Does have some mild limitation of motion of his right hip but really not much pain.  Prior films demonstrated significant osteoarthritis.  Reflexes were depressed but symmetrical.  Appeared to have excellent strength of both lower extremities.  No percussible tenderness of his lumbar spine.  Recently had a more detailed  neurologic evaluation by Dr. Jannifer Franklin  Specialty Comments:  No specialty comments available.  Imaging: No results found.   PMFS History: Patient Active Problem List   Diagnosis Date Noted  . Spinal stenosis 02/04/2018  . Low back pain 01/10/2018  . Prostate cancer ([place]) 12/16/2015  . BMI 25.0-25.9,adult 11/20/2014  . Medication management 11/14/2013  . Vitamin D deficiency 02/12/2013  . History of elevated glucose 02/12/2013  . Elevated prostate specific antigen (PSA) 02/12/2013  . Labile hypertension 02/12/2013  . Hyperlipidemia   . Sinus bradycardia    Past Medical History:  Diagnosis Date  . Allergic rhinitis   . Cancer [facility])    prostate  . DJD (degenerative joint disease)   . Hypertension    Borderline   . Sinus bradycardia   . Vitamin D deficiency     Family History  Problem Relation Age of Onset  . Stroke Mother   . Diabetes Mother   . Heart attack Father 16  . Hypertension Father   . Diabetes Maternal Aunt   . Diabetes Paternal Aunt   . Cancer Maternal Grandmother     Past Surgical History:  Procedure Laterality Date  . APPENDECTOMY    . cervical neck mass   07/08/06   Left Excision left posterior cervical neck mass (2.5 cm). SURGEON:  Earnstine Regal, M.D., Allegra Grana 07/08/06 and 12/05/07  . FLEXIBLE SIGMOIDOSCOPY    . HEMORRHOID SURGERY    . JOINT REPLACEMENT     Left knee 2013  . KNEE SURGERY    . MASTOIDECTOMY Left    Social History   Occupational History  . Occupation: RETIRED    Employer: [employer]    Comment: PRESIDENT  Tobacco Use  . Smoking status: Never Smoker  . Smokeless tobacco: Never Used  Substance and Sexual Activity  . Alcohol use: Yes    Alcohol/week: 10.0 standard drinks    Types: 10 Glasses of wine per week  . Drug use: No  . Sexual activity: Not on file     Garald Balding, MD   Note - This record has been created using [employer].  Chart creation errors have been sought, but may not always  have been  located. Such creation errors do not reflect on  the standard of medical care.

## 2018-03-25 DIAGNOSIS — C61 Malignant neoplasm of prostate: Secondary | ICD-10-CM | POA: Diagnosis not present

## 2018-03-29 DIAGNOSIS — N400 Enlarged prostate without lower urinary tract symptoms: Secondary | ICD-10-CM | POA: Diagnosis not present

## 2018-03-29 DIAGNOSIS — C61 Malignant neoplasm of prostate: Secondary | ICD-10-CM | POA: Diagnosis not present

## 2018-03-29 DIAGNOSIS — N5201 Erectile dysfunction due to arterial insufficiency: Secondary | ICD-10-CM | POA: Diagnosis not present

## 2018-04-04 ENCOUNTER — Encounter: Payer: Self-pay | Admitting: Internal Medicine

## 2018-04-07 ENCOUNTER — Telehealth (INDEPENDENT_AMBULATORY_CARE_PROVIDER_SITE_OTHER): Payer: Self-pay | Admitting: Orthopaedic Surgery

## 2018-04-07 NOTE — Telephone Encounter (Signed)
Patient left a voicemail checking on the status of his referral to physical therapy at [facility] and what information needs to be sent.

## 2018-04-07 NOTE — Telephone Encounter (Signed)
Spoke with patient. Told him that his appointment for therapy is scheduled for Feb 26th.

## 2018-04-13 DIAGNOSIS — M25551 Pain in right hip: Secondary | ICD-10-CM | POA: Diagnosis not present

## 2018-04-19 DIAGNOSIS — L570 Actinic keratosis: Secondary | ICD-10-CM | POA: Diagnosis not present

## 2018-04-19 DIAGNOSIS — Z85828 Personal history of other malignant neoplasm of skin: Secondary | ICD-10-CM | POA: Diagnosis not present

## 2018-04-19 DIAGNOSIS — L821 Other seborrheic keratosis: Secondary | ICD-10-CM | POA: Diagnosis not present

## 2018-04-20 DIAGNOSIS — M25551 Pain in right hip: Secondary | ICD-10-CM | POA: Diagnosis not present

## 2018-05-04 ENCOUNTER — Encounter: Payer: Self-pay | Admitting: Internal Medicine

## 2018-05-10 ENCOUNTER — Encounter: Payer: Self-pay | Admitting: Internal Medicine

## 2018-06-06 NOTE — Progress Notes (Signed)
MEDICARE ANNUAL WELLNESS VISIT AND FOLLOW UP  THIS ENCOUNTER IS A VIRTUAL/TELEPHONE VISIT DUE TO COVID-19 - PATIENT WAS NOT SEEN IN THE OFFICE.  PATIENT HAS CONSENTED TO VIRTUAL VISIT / TELEMEDICINE VISIT  This provider placed a call to Eugene Friedman using telephone, his appointment was changed to a virtual office visit to reduce the risk of exposure to the COVID-19 virus and to help Eugene Friedman remain healthy and safe. The virtual visit will also provide continuity of care. He verbalizes understanding.   Assessment:    Essential hypertension - continue medications, DASH diet, exercise and monitor at home. Call if greater than 130/80.   Sinus bradycardia Monitor closely  Prostate cancer [facility]) Follows with urology  Mixed hyperlipidemia -continue medications, check lipids, decrease fatty foods, increase activity.   Prediabetes Discussed general issues about diabetes pathophysiology and management., Educational material distributed., Suggested low cholesterol diet., Encouraged aerobic exercise., Discussed foot care., Reminded to get yearly retinal exam.  Vitamin D deficiency  Elevated prostate specific antigen (PSA) Continue to monitor  Medication management  BMI 25.67,  adult  Encounter for Medicare annual wellness exam 1 year  Spinal stenosis Keep moving, keep PT  Over 30 minutes of exam, counseling, chart review, and critical decision making was performed  Future Appointments  Date Time Provider [place]  07/07/2018 10:00 AM [name], MD [facility] None     Plan:   During the course of the visit the patient was educated and counseled about appropriate screening and preventive services including:    Pneumococcal vaccine   Influenza vaccine  Prevnar 13  Td vaccine  Screening electrocardiogram  Colorectal cancer screening  Diabetes screening  Glaucoma screening  Nutrition counseling    Subjective:  Eugene Friedman is a 85  y.o. Eugene Friedman male who presents for Medicare Annual Wellness Visit and 3 month follow up for HTN, hyperlipidemia, prediabetes, and vitamin D Def.   He would like to see his GF in [place], if there is a vaccine or study he is interested.   He has been doing PT twice a day that is helping significantly with his walking and balance. Had MRI back, thoracic and cervical and states that his exercise is doing better.   His blood pressure has been controlled at home at home are under 120/60's, today their BP is BP: 130/86  BP Readings from Last 3 Encounters:  06/08/18 130/86  03/24/18 137/82  02/04/18 120/78    He does workout, doing PT 2 x a day.  He denies chest pain, shortness of breath, dizziness.  He is on cholesterol medication and denies myalgias. His cholesterol is not at goal. The cholesterol last visit was:   Lab Results  Component Value Date   CHOL 213 (H) 02/04/2018   HDL 68 02/04/2018   LDLCALC 122 (H) 02/04/2018   TRIG 119 02/04/2018   CHOLHDL 3.1 02/04/2018    He has been working on diet and exercise for prediabetes, and denies paresthesia of the feet, polydipsia, polyuria and visual disturbances. Last A1C in the office was:  Lab Results  Component Value Date   HGBA1C 5.2 11/02/2017   Last GFR Lab Results  Component Value Date   GFRNONAA 41 (L) 02/04/2018   Patient is on Vitamin D supplement.   Lab Results  Component Value Date   VD25OH 50 11/02/2017     BMI is Body mass index is 25.71 kg/m., he is working on diet and exercise. Wt Readings from Last 3 Encounters:  06/08/18 203 lb (92.1 kg)  03/24/18 207 lb (93.9 kg)  02/04/18 206 lb (93.4 kg)    Medication Review: Current Outpatient Medications on File Prior to Visit  Medication Sig  . aspirin EC 81 MG tablet Take 81 mg by mouth daily.  . Cholecalciferol (VITAMIN D-3) 5000 UNITS TABS Take by mouth.  . dicyclomine (BENTYL) 20 MG tablet Take 1 tablet 3 x/ day before meals for bloating, cramping, or diarrhea  .  doxazosin (CARDURA) 8 MG tablet Take 1/2 to 1 tablet at Bedtime for Prostate & BP  . finasteride (PROSCAR) 5 MG tablet Take 1 tablet (5 mg total) by mouth daily.  . hyoscyamine (LEVSIN SL) 0.125 MG SL tablet DISSOLVE 1 TO 2 TABLETS UNDER THE TONGUE FOUR TIMES DAILY AS NEEDED FOR ABDOMINAL CRAMPS OR NAUSEA OR DIARRHEA  . Krill Oil 1000 MG CAPS Take 1,000 mg by mouth daily.  . LUTEIN PO Take 5 mg by mouth 2 (two) times daily.  Marland Kitchen neomycin-polymyxin-hydrocortisone (CORTISPORIN) 3.5-10000-1 OTIC suspension Place 4 drops into both ears 4 (four) times daily. (Patient taking differently: Place 4 drops into both ears 4 (four) times daily as needed. )  . tadalafil (CIALIS) 10 MG tablet 1/2-1 pill daily as directed.  . bisoprolol (ZEBETA) 5 MG tablet Take 1/2 to 1 tablet daily for BP and Pulse Rate (Patient taking differently: as needed. Take 1/2 to 1 tablet daily for BP and Pulse Rate)   No current facility-administered medications on file prior to visit.     Allergies: Allergies  Allergen Reactions  . Adhesive [Tape]   . Blueberry Flavor   . Celebrex [Celecoxib] Other (See Comments)    GI upset  . Lipitor [Atorvastatin]   . Nickel   . Ppd [Tuberculin Purified Protein Derivative] Other (See Comments)    Positive PPD    Current Problems (verified) has Hyperlipidemia; Sinus bradycardia; Vitamin D deficiency; History of elevated glucose; Elevated prostate specific antigen (PSA); Labile hypertension; Medication management; Prostate cancer ([place]); and Spinal stenosis on their problem list.  Screening Tests Immunization History  Administered Date(s) Administered  . H1N1 02/17/2007  . Influenza Whole 12/05/2012  . Influenza, High Dose Seasonal PF 11/14/2013, 11/20/2014, 12/16/2015, 10/30/2016, 02/04/2018  . Influenza-Unspecified 12/05/2012  . Pneumococcal Conjugate-13 12/05/2014  . Pneumococcal Polysaccharide-23 03/08/2008, 05/18/2013  . Tdap 02/17/2008  . Zoster 02/16/2005   Preventative  care: Last colonoscopy: more than 10 years ago, will not get another due to age  Prior vaccinations: TD or Tdap: 2010 Influenza: 2019 Pneumococcal: 2015 Prevnar13: 2016 Shingles/Zostavax: 2007  Names of Other Physician/Practitioners you currently use: 1. [facility] here for primary care 2. [place], eye doctor, last visit 10/2017 3. Mariane Masters, dentist, last visit 2019 Patient Care Team: [name], MD as PCP - General (Internal Medicine) Calvert Cantor, MD as Consulting Physician (Ophthalmology) Nahser, Wonda Cheng, MD as Consulting Physician (Cardiology) Carolan Clines, MD (Inactive) as Consulting Physician (Urology) Laurence Spates, MD as Consulting Physician (Gastroenterology) Armandina Gemma, MD as Consulting Physician (General Surgery) Garald Balding, MD as Consulting Physician (Orthopedic Surgery)  Surgical: He  has a past surgical history that includes cervical neck mass  (07/08/06); Appendectomy; Mastoidectomy (Left); Flexible sigmoidoscopy; Knee surgery; Hemorrhoid surgery; and Joint replacement. Family His family history includes Cancer in his maternal grandmother; Diabetes in his maternal aunt, mother, and paternal aunt; Heart attack (age of onset: 47) in his father; Hypertension in his father; Stroke in his mother. Social history  He reports that he has never smoked.  He has never used smokeless tobacco. He reports current alcohol use of about 10.0 standard drinks of alcohol per week. He reports that he does not use drugs.  MEDICARE WELLNESS OBJECTIVES: Physical activity: Current Exercise Habits: The patient does not participate in regular exercise at present, Exercise limited by: orthopedic condition(s) Cardiac risk factors: Cardiac Risk Factors include: advanced age (>32men, >31 women);dyslipidemia;hypertension;male gender;sedentary lifestyle Depression/mood screen:   Depression screen [facility] 2/9 06/08/2018  Decreased Interest 0   Down, Depressed, Hopeless 0  PHQ - 2 Score 0    ADLs:  In your present state of health, do you have any difficulty performing the following activities: 06/08/2018 11/02/2017  Hearing? N N  Vision? N N  Difficulty concentrating or making decisions? N N  Walking or climbing stairs? N N  Dressing or bathing? N N  Doing errands, shopping? N N  Some recent data might be hidden     Cognitive Testing  Alert? Yes  Normal Appearance?Yes  Oriented to person? Yes  Place? Yes   Time? Yes  Recall of three objects?  Yes  Can perform simple calculations? Yes  Displays appropriate judgment?Yes  Can read the correct time from a watch face?Yes  EOL planning: Does Patient Have a Medical Advance Directive?: Yes Type of Advance Directive: Healthcare Power of Attorney, Living will Copy of [place] in Chart?: No - copy requested   Objective:   Today's Vitals   06/08/18 0944  BP: 130/86  Weight: 203 lb (92.1 kg)  Height: 6' 2.5" (1.892 m)   Body mass index is 25.71 kg/m.  General appearance: alert, no distress, WD/WN, male HEENT: normocephalic, sclerae anicteric, TMs pearly, nares patent, no discharge or erythema, pharynx normal Oral cavity: MMM, no lesions Neck: supple, no lymphadenopathy, no thyromegaly, no masses Heart: RRR, normal S1, S2, no murmurs Lungs: CTA bilaterally, no wheezes, rhonchi, or rales Abdomen: +bs, soft, non tender, non distended, no masses, no hepatomegaly, no splenomegaly Musculoskeletal: nontender, no swelling, no obvious deformity Extremities: no edema, no cyanosis, no clubbing Pulses: 2+ symmetric, upper and lower extremities, normal cap refill Neurological: alert, oriented x 3, CN2-12 intact, strength normal upper extremities and lower extremities, sensation normal throughout, DTRs 2+ throughout, no cerebellar signs, gait normal Psychiatric: normal affect, behavior normal, pleasant   Medicare Attestation I have personally reviewed: The  patient's medical and social history Their use of alcohol, tobacco or illicit drugs Their current medications and supplements The patient's functional ability including ADLs,fall risks, home safety risks, cognitive, and hearing and visual impairment Diet and physical activities Evidence for depression or mood disorders  The patient's weight, height, BMI, and visual acuity have been recorded in the chart.  I have made referrals, counseling, and provided education to the patient based on review of the above and I have provided the patient with a written personalized care plan for preventive services.     Vicie Mutters, PA-C   06/08/2018

## 2018-06-08 ENCOUNTER — Other Ambulatory Visit: Payer: Self-pay

## 2018-06-08 ENCOUNTER — Encounter: Payer: Self-pay | Admitting: Physician Assistant

## 2018-06-08 ENCOUNTER — Telehealth: Payer: Medicare Other | Admitting: Physician Assistant

## 2018-06-08 VITALS — BP 130/86 | Ht 74.5 in | Wt 203.0 lb

## 2018-06-08 DIAGNOSIS — I10 Essential (primary) hypertension: Secondary | ICD-10-CM

## 2018-06-08 DIAGNOSIS — Z8639 Personal history of other endocrine, nutritional and metabolic disease: Secondary | ICD-10-CM

## 2018-06-08 DIAGNOSIS — E782 Mixed hyperlipidemia: Secondary | ICD-10-CM | POA: Diagnosis not present

## 2018-06-08 DIAGNOSIS — R6889 Other general symptoms and signs: Secondary | ICD-10-CM | POA: Diagnosis not present

## 2018-06-08 DIAGNOSIS — C61 Malignant neoplasm of prostate: Secondary | ICD-10-CM

## 2018-06-08 DIAGNOSIS — E559 Vitamin D deficiency, unspecified: Secondary | ICD-10-CM

## 2018-06-08 DIAGNOSIS — R0989 Other specified symptoms and signs involving the circulatory and respiratory systems: Secondary | ICD-10-CM

## 2018-06-08 DIAGNOSIS — Z0000 Encounter for general adult medical examination without abnormal findings: Secondary | ICD-10-CM

## 2018-06-08 DIAGNOSIS — Z79899 Other long term (current) drug therapy: Secondary | ICD-10-CM

## 2018-06-08 DIAGNOSIS — R001 Bradycardia, unspecified: Secondary | ICD-10-CM

## 2018-06-08 DIAGNOSIS — Z0001 Encounter for general adult medical examination with abnormal findings: Secondary | ICD-10-CM | POA: Diagnosis not present

## 2018-06-08 DIAGNOSIS — M4800 Spinal stenosis, site unspecified: Secondary | ICD-10-CM

## 2018-07-07 ENCOUNTER — Encounter: Payer: Self-pay | Admitting: Internal Medicine

## 2018-07-08 DIAGNOSIS — M25551 Pain in right hip: Secondary | ICD-10-CM | POA: Diagnosis not present

## 2018-07-12 DIAGNOSIS — M48062 Spinal stenosis, lumbar region with neurogenic claudication: Secondary | ICD-10-CM | POA: Diagnosis not present

## 2018-07-12 DIAGNOSIS — I671 Cerebral aneurysm, nonruptured: Secondary | ICD-10-CM | POA: Diagnosis not present

## 2018-08-18 ENCOUNTER — Encounter: Payer: Self-pay | Admitting: Adult Health

## 2018-08-18 ENCOUNTER — Ambulatory Visit (INDEPENDENT_AMBULATORY_CARE_PROVIDER_SITE_OTHER): Payer: Medicare Other | Admitting: Adult Health

## 2018-08-18 ENCOUNTER — Other Ambulatory Visit: Payer: Self-pay

## 2018-08-18 VITALS — BP 142/84 | HR 84 | Temp 97.3°F | Wt 200.0 lb

## 2018-08-18 DIAGNOSIS — M79662 Pain in left lower leg: Secondary | ICD-10-CM

## 2018-08-18 DIAGNOSIS — M25472 Effusion, left ankle: Secondary | ICD-10-CM | POA: Diagnosis not present

## 2018-08-18 NOTE — Patient Instructions (Addendum)
Deep Vein Thrombosis  Deep vein thrombosis (DVT) is a condition in which a blood clot forms in a deep vein, such as a lower leg, thigh, or arm vein. A clot is blood that has thickened into a gel or solid. This condition is dangerous. It can lead to serious and even life-threatening complications if the clot travels to the lungs and causes a blockage (pulmonary embolism). It can also damage veins in the leg. This can result in leg pain, swelling, discoloration, and sores (post-thrombotic syndrome). What are the causes? This condition may be caused by:  A slowdown of blood flow.  Damage to a vein.  A condition that causes blood to clot more easily, such as an inherited clotting disorder. What increases the risk? The following factors may make you more likely to develop this condition:  Being overweight.  Being older, especially over age 74.  Sitting or lying down for more than four hours.  Being in the hospital.  Lack of physical activity (sedentary lifestyle).  Pregnancy, being in childbirth, or having recently given birth.  Taking medicines that contain estrogen, such as medicines to prevent pregnancy.  Smoking.  A history of any of the following: ? Blood clots or a blood clotting disease. ? Peripheral vascular disease. ? Inflammatory bowel disease. ? Cancer. ? Heart disease. ? Genetic conditions that affect how your blood clots, such as Factor V Leiden mutation. ? Neurological diseases that affect your legs (leg paresis). ? A recent injury, such as a car accident. ? Major or lengthy surgery. ? A central line placed inside a large vein. What are the signs or symptoms? Symptoms of this condition include:  Swelling, pain, or tenderness in an arm or leg.  Warmth, redness, or discoloration in an arm or leg. If the clot is in your leg, symptoms may be more noticeable or worse when you stand or walk. Some people may not develop any symptoms. How is this diagnosed? This  condition is diagnosed with:  A medical history and physical exam.  Tests, such as: ? Blood tests. These are done to check how well your blood clots. ? Ultrasound. This is done to check for clots. ? Venogram. For this test, contrast dye is injected into a vein and X-rays are taken to check for any clots. How is this treated? Treatment for this condition depends on:  The cause of your DVT.  Your risk for bleeding or developing more clots.  Any other medical conditions that you have. Treatment may include:  Taking a blood thinner (anticoagulant). This type of medicine prevents clots from forming. It may be taken by mouth, injected under the skin, or injected through an IV (catheter).  Injecting clot-dissolving medicines into the affected vein (catheter-directed thrombolysis).  Having surgery. Surgery may be done to: ? Remove the clot. ? Place a filter in a large vein to catch blood clots before they reach the lungs. Some treatments may be continued for up to six months. Follow these instructions at home: If you are taking blood thinners:  Take the medicine exactly as told by your health care provider. Some blood thinners need to be taken at the same time every day. Do not skip a dose.  Talk with your health care provider before you take any medicines that contain aspirin or NSAIDs. These medicines increase your risk for dangerous bleeding.  Ask your health care provider about foods and drugs that could change the way the medicine works (may interact). Avoid those things if  your health care provider tells you to do so.  Blood thinners can cause easy bruising and may make it difficult to stop bleeding. Because of this: ? Be very careful when using knives, scissors, or other sharp objects. ? Use an electric razor instead of a blade. ? Avoid activities that could cause injury or bruising, and follow instructions about how to prevent falls.  Wear a medical alert bracelet or carry a  card that lists what medicines you take. General instructions  Take over-the-counter and prescription medicines only as told by your health care provider.  Return to your normal activities as told by your health care provider. Ask your health care provider what activities are safe for you.  Wear compression stockings if recommended by your health care provider.  Keep all follow-up visits as told by your health care provider. This is important. How is this prevented? To lower your risk of developing this condition again:  For 30 or more minutes every day, do an activity that: ? Involves moving your arms and legs. ? Increases your heart rate.  When traveling for longer than four hours: ? Exercise your arms and legs every hour. ? Drink plenty of water. ? Avoid drinking alcohol.  Avoid sitting or lying for a long time without moving your legs.  If you have surgery or you are hospitalized, ask about ways to prevent blood clots. These may include taking frequent walks or using anticoagulants.  Stay at a healthy weight.  If you are a woman who is older than age 37, avoid unnecessary use of medicines that contain estrogen, such as some birth control pills.  Do not use any products that contain nicotine or tobacco, such as cigarettes and e-cigarettes. This is especially important if you take estrogen medicines. If you need help quitting, ask your health care provider. Contact a health care provider if:  You miss a dose of your blood thinner.  Your menstrual period is heavier than usual.  You have unusual bruising. Get help right away if:  You have: ? New or increased pain, swelling, or redness in an arm or leg. ? Numbness or tingling in an arm or leg. ? Shortness of breath. ? Chest pain. ? A rapid or irregular heartbeat. ? A severe headache or confusion. ? A cut that will not stop bleeding.  There is blood in your vomit, stool, or urine.  You have a serious fall or accident,  or you hit your head.  You feel light-headed or dizzy.  You cough up blood. These symptoms may represent a serious problem that is an emergency. Do not wait to see if the symptoms will go away. Get medical help right away. Call your local emergency services (911 in the [place] Do not drive yourself to the hospital. Summary  Deep vein thrombosis (DVT) is a condition in which a blood clot forms in a deep vein, such as a lower leg, thigh, or arm vein.  Symptoms can include swelling, warmth, pain, and redness in your leg or arm.  This condition may be treated with a blood thinner (anticoagulant medicine), medicine that is injected to dissolve blood clots,compression stockings, or surgery.  If you are prescribed blood thinners, take them exactly as told. This information is not intended to replace advice given to you by your health care provider. Make sure you discuss any questions you have with your health care provider. Document Released: 02/02/2005 Document Revised: 01/15/2017 Document Reviewed: 07/03/2016 Elsevier Patient Education  2020 [employer].  Rivaroxaban oral tablets What is this medicine? RIVAROXABAN (ri va ROX a ban) is an anticoagulant (blood thinner). It is used to treat blood clots in the lungs or in the veins. It is also used to prevent blood clots in the lungs or in the veins. It is also used to lower the chance of stroke in people with a medical condition called atrial fibrillation. This medicine may be used for other purposes; ask your health care provider or pharmacist if you have questions. COMMON BRAND NAME(S): Xarelto, Xarelto Starter Pack What should I tell my health care provider before I take this medicine? They need to know if you have any of these conditions:  antiphospholipid antibody syndrome  artificial heart valve  bleeding disorders  bleeding in the brain  blood in your stools (black or tarry stools) or if you have blood in your vomit   history of blood clots  history of stomach bleeding  kidney disease  liver disease  low blood counts, like low white cell, platelet, or red cell counts  recent or planned spinal or epidural procedure  take medicines that treat or prevent blood clots  an unusual or allergic reaction to rivaroxaban, other medicines, foods, dyes, or preservatives  pregnant or trying to get pregnant  breast-feeding How should I use this medicine? Take this medicine by mouth with a glass of water. Follow the directions on the prescription label. Take your medicine at regular intervals. Do not take it more often than directed. Do not stop taking except on your doctor's advice. Stopping this medicine may increase your risk of a blood clot. Be sure to refill your prescription before you run out of medicine. If you are taking this medicine after hip or knee replacement surgery, take it with or without food. If you are taking this medicine for atrial fibrillation, take it with your evening meal. If you are taking this medicine to treat blood clots, take it with food at the same time each day. If you are unable to swallow your tablet, you may crush the tablet and mix it in applesauce. Then, immediately eat the applesauce. You should eat more food right after you eat the applesauce containing the crushed tablet. Talk to your pediatrician regarding the use of this medicine in children. Special care may be needed. Overdosage: If you think you have taken too much of this medicine contact a poison control center or emergency room at once. NOTE: This medicine is only for you. Do not share this medicine with others. What if I miss a dose? If you take your medicine once a day and miss a dose, take the missed dose as soon as you remember. If it is almost time for your next dose, take only that dose. Do not take double or extra doses. If you take your medicine twice a day and miss a dose, take the missed dose immediately. In  this instance, 2 tablets may be taken at the same time. The next day you should take 1 tablet twice a day as directed. What may interact with this medicine? Do not take this medicine with any of the following medications:  defibrotide This medicine may also interact with the following medications:  aspirin and aspirin-like medicines  certain antibiotics like erythromycin, azithromycin, and clarithromycin  certain medicines for fungal infections like ketoconazole and itraconazole  certain medicines for irregular heart beat like amiodarone, quinidine, dronedarone  certain medicines for seizures like carbamazepine, phenytoin  certain medicines that treat or prevent blood  clots like warfarin, enoxaparin, and dalteparin  conivaptan  felodipine  indinavir  lopinavir; ritonavir  NSAIDS, medicines for pain and inflammation, like ibuprofen or naproxen  ranolazine  rifampin  ritonavir  SNRIs, medicines for depression, like desvenlafaxine, duloxetine, levomilnacipran, venlafaxine  SSRIs, medicines for depression, like citalopram, escitalopram, fluoxetine, fluvoxamine, paroxetine, sertraline  St. John's wort  verapamil This list may not describe all possible interactions. Give your health care provider a list of all the medicines, herbs, non-prescription drugs, or dietary supplements you use. Also tell them if you smoke, drink alcohol, or use illegal drugs. Some items may interact with your medicine. What should I watch for while using this medicine? Visit your healthcare professional for regular checks on your progress. You may need blood work done while you are taking this medicine. Your condition will be monitored carefully while you are receiving this medicine. It is important not to miss any appointments. Avoid sports and activities that might cause injury while you are using this medicine. Severe falls or injuries can cause unseen bleeding. Be careful when using sharp tools or  knives. Consider using an Copy. Take special care brushing or flossing your teeth. Report any injuries, bruising, or red spots on the skin to your healthcare professional. If you are going to need surgery or other procedure, tell your healthcare professional that you are taking this medicine. Wear a medical ID bracelet or chain. Carry a card that describes your disease and details of your medicine and dosage times. What side effects may I notice from receiving this medicine? Side effects that you should report to your doctor or health care professional as soon as possible:  allergic reactions like skin rash, itching or hives, swelling of the face, lips, or tongue  back pain  redness, blistering, peeling or loosening of the skin, including inside the mouth  signs and symptoms of bleeding such as bloody or black, tarry stools; red or dark-brown urine; spitting up blood or brown material that looks like coffee grounds; red spots on the skin; unusual bruising or bleeding from the eye, gums, or nose  signs and symptoms of a blood clot such as chest pain; shortness of breath; pain, swelling, or warmth in the leg  signs and symptoms of a stroke such as changes in vision; confusion; trouble speaking or understanding; severe headaches; sudden numbness or weakness of the face, arm or leg; trouble walking; dizziness; loss of coordination Side effects that usually do not require medical attention (report to your doctor or health care professional if they continue or are bothersome):  dizziness  muscle pain This list may not describe all possible side effects. Call your doctor for medical advice about side effects. You may report side effects to FDA at 1-800-FDA-1088. Where should I keep my medicine? Keep out of the reach of children. Store at room temperature between 15 and 30 degrees C (59 and 86 degrees F). Throw away any unused medicine after the expiration date. NOTE: This sheet is a  summary. It may not cover all possible information. If you have questions about this medicine, talk to your doctor, pharmacist, or health care provider.  2020 Elsevier/Gold Standard (2018-05-02 09:45:59)

## 2018-08-18 NOTE — Progress Notes (Signed)
Assessment and Plan:  Eugene Friedman was seen today for leg pain.  Diagnoses and all orders for this visit:  Pain of left calf/ Left ankle swelling Concerning for DVT vs musculoskeletal etiology;  Will coordinate [place] to r/o, discussed xarelto should this be positive, reviewed risks and benefits of medication, including bleeding concerns; stop ASA if initiating No hx of clots; does have prostate CA under active surveillance and increased periods of immobility  Ortho exam unremarkable; could try antiinflammatory should [place] study be negative; ortho referral if persistent and unexplained He will follow up with neurology regarding ongoing spinal stenosis concerns Patient and daughter in agreement with plan of care and departed after verification of [place] scheduled at 9:00 AM tomorrow  -     VAS [place] LOWER EXTREMITY VENOUS (DVT); Future  Further disposition pending results of labs. Discussed med's effects and SE's.   Over 15 minutes of exam, counseling, chart review, and critical decision making was performed.   Future Appointments  Date Time Provider [place]  10/26/2018  2:00 PM Eugene Pinto, MD [facility] None  06/14/2019 10:00 AM Eugene Mutters, PA-C [facility] None    ------------------------------------------------------------------------------------------------------------------   HPI BP (!) 142/84   Pulse 84   Temp (!) 97.3 F (36.3 C)   Wt 200 lb (90.7 kg)   SpO2 98%   BMI 25.33 kg/m   Eugene Friedman with htn, sinus bradycardia, prostate cancer under active surgeillance by urology presents accompanied by his daughter for evaluation of L calf pain that he woke up with approx 10 days ago, reports was quite severely, cramp like, deep, localized in medial calf without radiation, 5/10, started noting ankle and foot swelling a few days later that has been persistent. He has not taken anything for the pain except tried topical voltaren yesteday, reports this has not been helpful. Denies  personal or family history of clots. He is on bASA. Non-smoker. No recent travel or unusual events. He admits he is sitting for extended periods of time due to back pain/spinal stenosis for which he is following with neurology.     Past Medical History:  Diagnosis Date  . Allergic rhinitis   . Cancer [facility])    prostate  . DJD (degenerative joint disease)   . Hypertension    Borderline   . Sinus bradycardia   . Vitamin D deficiency      Allergies  Allergen Reactions  . Adhesive [Tape]   . Blueberry Flavor   . Celebrex [Celecoxib] Other (See Comments)    GI upset  . Lipitor [Atorvastatin]   . Nickel   . Ppd [Tuberculin Purified Protein Derivative] Other (See Comments)    Positive PPD    Current Outpatient Medications on File Prior to Visit  Medication Sig  . aspirin EC 81 MG tablet Take 81 mg by mouth daily.  . bisoprolol (ZEBETA) 5 MG tablet Take 1/2 to 1 tablet daily for BP and Pulse Rate (Patient taking differently: as needed. Take 1/2 to 1 tablet daily for BP and Pulse Rate)  . Cholecalciferol (VITAMIN D-3) 5000 UNITS TABS Take by mouth.  . dicyclomine (BENTYL) 20 MG tablet Take 1 tablet 3 x/ day before meals for bloating, cramping, or diarrhea  . doxazosin (CARDURA) 8 MG tablet Take 1/2 to 1 tablet at Bedtime for Prostate & BP  . finasteride (PROSCAR) 5 MG tablet Take 1 tablet (5 mg total) by mouth daily.  . hyoscyamine (LEVSIN SL) 0.125 MG SL tablet DISSOLVE 1 TO 2 TABLETS UNDER THE TONGUE  FOUR TIMES DAILY AS NEEDED FOR ABDOMINAL CRAMPS OR NAUSEA OR DIARRHEA  . Krill Oil 1000 MG CAPS Take 1,000 mg by mouth daily.  . LUTEIN PO Take 5 mg by mouth 2 (two) times daily.  Marland Kitchen neomycin-polymyxin-hydrocortisone (CORTISPORIN) 3.5-10000-1 OTIC suspension Place 4 drops into both ears 4 (four) times daily. (Patient taking differently: Place 4 drops into both ears 4 (four) times daily as needed. )  . tadalafil (CIALIS) 10 MG tablet 1/2-1 pill daily as directed.   No current  facility-administered medications on file prior to visit.     ROS: Review of Systems  Constitutional: Negative for chills, diaphoresis, fever, malaise/fatigue and weight loss.  Eyes: Negative for blurred vision and double vision.  Respiratory: Negative for cough, shortness of breath and wheezing.   Cardiovascular: Positive for leg swelling (left ankle). Negative for chest pain, palpitations, orthopnea, claudication and PND.  Gastrointestinal: Negative.   Musculoskeletal: Positive for back pain. Negative for falls and joint pain.       Left calf pain, acute, persistent  Skin: Negative for rash.  Neurological: Negative for dizziness, sensory change and headaches.    Physical Exam:  BP (!) 142/84   Pulse 84   Temp (!) 97.3 F (36.3 C)   Wt 200 lb (90.7 kg)   SpO2 98%   BMI 25.33 kg/m   General Appearance: Well nourished elder, in no apparent distress. Eyes: PERRLA, conjunctiva no swelling or erythema ENT/Mouth: No erythema, swelling, or exudate on post pharynx.  Tonsils not swollen or erythematous. Hearing normal.   Neck: Supple Respiratory: Respiratory effort normal, BS equal bilaterally without rales, rhonchi, wheezing or stridor.  Cardio: RRR with no MRGs. Symmetrical peripheral pulses with edema, 2+ localized to left ankle Abdomen: Soft, + BS.  Non tender, no guarding, rebound, hernias, masses. Lymphatics: Non tender without lymphadenopathy.  Musculoskeletal: Full ROM, Symmetrical strength, normal gait. Point tenderness to left medial calf, circumference symmetrical bilaterally, negative Homan's Skin: Warm, dry without rashes, lesions, ecchymosis.  Neuro: Normal muscle tone, no cerebellar symptoms.  Psych: Awake and oriented X 3, normal affect, Insight and Judgment appropriate.     Eugene Ribas, NP 11:51 AM [facility]

## 2018-08-19 ENCOUNTER — Other Ambulatory Visit: Payer: Self-pay | Admitting: Internal Medicine

## 2018-08-19 ENCOUNTER — Ambulatory Visit (HOSPITAL_COMMUNITY)
Admission: RE | Admit: 2018-08-19 | Discharge: 2018-08-19 | Disposition: A | Discharge status: Home / Self Care | Payer: Medicare Other | Source: Ambulatory Visit | Attending: Adult Health | Admitting: Adult Health

## 2018-08-19 DIAGNOSIS — M25472 Effusion, left ankle: Secondary | ICD-10-CM

## 2018-08-19 DIAGNOSIS — M79662 Pain in left lower leg: Secondary | ICD-10-CM | POA: Diagnosis not present

## 2018-08-19 MED ORDER — MELOXICAM 15 MG PO TABS: ORAL_TABLET | ORAL | 1 refills | Status: DC

## 2018-08-19 NOTE — Progress Notes (Signed)
Left lower ext venous  has been completed. Refer to [facility] under chart review to view preliminary results. Results given to [facility], NP.    08/19/2018  9:44 AM Elenor Quinones, Bonnye Fava

## 2018-09-16 ENCOUNTER — Encounter (INDEPENDENT_AMBULATORY_CARE_PROVIDER_SITE_OTHER): Payer: Self-pay | Admitting: Orthopaedic Surgery

## 2018-09-23 ENCOUNTER — Telehealth: Payer: Self-pay | Admitting: Orthopaedic Surgery

## 2018-09-23 NOTE — Telephone Encounter (Signed)
Disc of previous MRIs and x-rays of right hip have been placed into the mail.  [facility] [address] [place] [address]

## 2018-09-23 NOTE — Telephone Encounter (Signed)
Thanks for sending all that info

## 2018-10-04 ENCOUNTER — Encounter: Payer: Self-pay | Admitting: Orthopaedic Surgery

## 2018-10-04 ENCOUNTER — Ambulatory Visit (INDEPENDENT_AMBULATORY_CARE_PROVIDER_SITE_OTHER): Payer: Medicare Other | Admitting: Orthopaedic Surgery

## 2018-10-04 ENCOUNTER — Other Ambulatory Visit: Payer: Self-pay

## 2018-10-04 VITALS — BP 146/84 | HR 93 | Ht 74.5 in | Wt 200.0 lb

## 2018-10-04 DIAGNOSIS — G8929 Other chronic pain: Secondary | ICD-10-CM | POA: Diagnosis not present

## 2018-10-04 DIAGNOSIS — M5441 Lumbago with sciatica, right side: Secondary | ICD-10-CM | POA: Diagnosis not present

## 2018-10-04 DIAGNOSIS — M5442 Lumbago with sciatica, left side: Secondary | ICD-10-CM | POA: Diagnosis not present

## 2018-10-04 NOTE — Addendum Note (Signed)
Addended by: Lendon Collar on: 10/04/2018 04:56 PM   Modules accepted: Orders

## 2018-10-04 NOTE — Progress Notes (Signed)
Office Visit Note   Patient: Eugene Friedman           Date of Birth: 11/15/1928           MRN: [id number] Visit Date: 10/04/2018              Requested by: [name], [address] [place],  [place] [id number] PCP: [name], MD   Assessment & Plan: Visit Diagnoses:  1. Chronic bilateral low back pain with bilateral sciatica     Plan: Eugene Friedman has had a chronic problem with his lumbar, thoracic and cervical spine.  He has had an MRI scan of each area demonstrating an AV malformation in the lower thoracic spine and areas of significant stenosis in the lower lumbar spine.  He has been seen by the local neurologist and the neurosurgeon.  He is still searching for answers.  He and his daughter, Eugene Friedman, found and neurosurgeon at [facility] [place], to whom they would like to have a referral.  We have organized all of his data, MRI scan reports and discs of his x-rays and MRI scans for him to take and we will call and see if we can make the appointment.  He continues to have some weakness of his legs and feels a little bit unsteady.  I believe he has been diagnosed with a peripheral neuropathy in addition to the issues he is having with his back.  He like to remain as active as he possibly can.  He is not had an epidural steroid injection  Follow-Up Instructions: Return if symptoms worsen or fail to improve.   Orders:  No orders of the defined types were placed in this encounter.  No orders of the defined types were placed in this encounter.     Procedures: No procedures performed   Clinical Data: No additional findings.   Subjective: Chief Complaint  Patient presents with  . Lower Back - Pain  Patient presents today for a 6 month follow up on his back pain. He states that he has gotten a couple different solutions and is confused. He has been to [facility] and was given a physical therapy routine to do at home. He has questions regarding his  MRI findings on his lower back.  MRI scan was performed in November 2019 demonstrating severe canal stenosis at L4-5, moderate to severe at L3-4 and moderate at L2-3.  In addition he had spinal cord edema from T7 inferiorly through the conus consistent with a spinal dural AVF.  He has been evaluated by both the neurologist and the neurosurgeon but would like a referral to [facility]. In addition he has of significant arthritis of his right hip but with therapy and exercises does not feel like that is a compromise  HPI  Review of Systems   Objective: Vital Signs: BP (!) 146/84   Pulse 93   Ht 6' 2.5" (1.892 m)   Wt 200 lb (90.7 kg)   BMI 25.33 kg/m   Physical Exam Constitutional:      Appearance: He is well-developed.  Eyes:     Pupils: Pupils are equal, round, and reactive to light.  Pulmonary:     Effort: Pulmonary effort is normal.  Skin:    General: Skin is warm and dry.  Neurological:     Mental Status: He is alert and oriented to person, place, and time.  Psychiatric:        Behavior: Behavior normal.  Ortho Exam has history of arthritis of his right hip and has an antalgic gait.  Does have some altered sensibility in both of his feet consistent with his prior diagnosis of peripheral neuropathy.  +1 pulses.  No edema.  No percussible back pain.  Does have limited range of motion with pain of his right hip compared to his left consistent with the arthritis  Specialty Comments:  No specialty comments available.  Imaging: No results found.   PMFS History: Patient Active Problem List   Diagnosis Date Noted  . Spinal stenosis 02/04/2018  . Prostate cancer ([place]) 12/16/2015  . Medication management 11/14/2013  . Vitamin D deficiency 02/12/2013  . History of elevated glucose 02/12/2013  . Elevated prostate specific antigen (PSA) 02/12/2013  . Labile hypertension 02/12/2013  . Hyperlipidemia   . Sinus bradycardia    Past Medical History:  Diagnosis Date  . Allergic  rhinitis   . Cancer [facility])    prostate  . DJD (degenerative joint disease)   . Hypertension    Borderline   . Sinus bradycardia   . Vitamin D deficiency     Family History  Problem Relation Age of Onset  . Stroke Mother   . Diabetes Mother   . Heart attack Father 14  . Hypertension Father   . Diabetes Maternal Aunt   . Diabetes Paternal Aunt   . Cancer Maternal Grandmother     Past Surgical History:  Procedure Laterality Date  . APPENDECTOMY    . cervical neck mass   07/08/06   Left Excision left posterior cervical neck mass (2.5 cm). SURGEON:  Earnstine Regal, M.D., Allegra Grana 07/08/06 and 12/05/07  . FLEXIBLE SIGMOIDOSCOPY    . HEMORRHOID SURGERY    . JOINT REPLACEMENT     Left knee 2013  . KNEE SURGERY    . MASTOIDECTOMY Left    Social History   Occupational History  . Occupation: RETIRED    Employer: [employer]    Comment: PRESIDENT  Tobacco Use  . Smoking status: Never Smoker  . Smokeless tobacco: Never Used  Substance and Sexual Activity  . Alcohol use: Yes    Alcohol/week: 10.0 standard drinks    Types: 10 Glasses of wine per week  . Drug use: No  . Sexual activity: Not on file

## 2018-10-10 ENCOUNTER — Other Ambulatory Visit: Payer: Self-pay | Admitting: Adult Health

## 2018-10-10 DIAGNOSIS — K582 Mixed irritable bowel syndrome: Secondary | ICD-10-CM

## 2018-10-18 DIAGNOSIS — I4891 Unspecified atrial fibrillation: Secondary | ICD-10-CM

## 2018-10-18 HISTORY — DX: Unspecified atrial fibrillation: I48.91

## 2018-10-25 ENCOUNTER — Encounter: Payer: Self-pay | Admitting: Orthopaedic Surgery

## 2018-10-26 ENCOUNTER — Encounter: Payer: Self-pay | Admitting: Internal Medicine

## 2018-10-26 ENCOUNTER — Ambulatory Visit (INDEPENDENT_AMBULATORY_CARE_PROVIDER_SITE_OTHER): Payer: Medicare Other | Admitting: Internal Medicine

## 2018-10-26 ENCOUNTER — Other Ambulatory Visit: Payer: Self-pay

## 2018-10-26 VITALS — BP 162/84 | HR 76 | Temp 97.2°F | Resp 16 | Ht 74.0 in | Wt 202.2 lb

## 2018-10-26 DIAGNOSIS — Z8249 Family history of ischemic heart disease and other diseases of the circulatory system: Secondary | ICD-10-CM

## 2018-10-26 DIAGNOSIS — I10 Essential (primary) hypertension: Secondary | ICD-10-CM | POA: Diagnosis not present

## 2018-10-26 DIAGNOSIS — R7309 Other abnormal glucose: Secondary | ICD-10-CM

## 2018-10-26 DIAGNOSIS — I4891 Unspecified atrial fibrillation: Secondary | ICD-10-CM

## 2018-10-26 DIAGNOSIS — C61 Malignant neoplasm of prostate: Secondary | ICD-10-CM | POA: Diagnosis not present

## 2018-10-26 DIAGNOSIS — Z136 Encounter for screening for cardiovascular disorders: Secondary | ICD-10-CM

## 2018-10-26 DIAGNOSIS — Z79899 Other long term (current) drug therapy: Secondary | ICD-10-CM

## 2018-10-26 DIAGNOSIS — E782 Mixed hyperlipidemia: Secondary | ICD-10-CM | POA: Diagnosis not present

## 2018-10-26 DIAGNOSIS — R972 Elevated prostate specific antigen [PSA]: Secondary | ICD-10-CM | POA: Diagnosis not present

## 2018-10-26 DIAGNOSIS — Z1211 Encounter for screening for malignant neoplasm of colon: Secondary | ICD-10-CM

## 2018-10-26 DIAGNOSIS — R7303 Prediabetes: Secondary | ICD-10-CM

## 2018-10-26 DIAGNOSIS — E559 Vitamin D deficiency, unspecified: Secondary | ICD-10-CM

## 2018-10-26 MED ORDER — RIVAROXABAN 20 MG PO TABS: ORAL_TABLET | ORAL | 3 refills | Status: DC

## 2018-10-26 MED ORDER — DILTIAZEM HCL ER COATED BEADS 240 MG PO CP24: ORAL_CAPSULE | ORAL | 3 refills | Status: DC

## 2018-10-26 NOTE — Progress Notes (Addendum)
Comprehensive Evaluation & Examination     This very nice 85 y.o. [place] - a patient since 19781 -  presents for a  comprehensive evaluation and management of multiple medical co-morbidities.  Patient has been followed for HTN, HLD, Prediabetes and Vitamin D Deficiency. Patient has hx/o Gleason 6 Prostate Ca followed by active surveillance by Dr Gerald Stabs Lovena Neighbours. Other problems include hx/o IBS-D.      Patient has been referred by local Orthopedist, Dr Mamie Nick. Durward Fortes , to Neurosurgeon, Dr Rona Ravens, at [facility] for 2sd opinion tomorrow for evaluation re: management of  "a chronic problem with his lumbar, thoracic and cervical spine.  He  had  MRI scans of the total spine demonstrating a lower thoracic spine AVM and lumbar spinal stenosis."     HTN predates since 2001. Patient's BP has been controlled at home. He's had negative Cardiolite in 2011 & a negative ETT in May 2017.  Today's BP is elevated at 164/84 and rechecked at 153/88. Today's EKG found NEW rate controled Afib and in hindsight, patient does admit recent increased DOE, but denies any CP or palpitations.  Patient is a CHADsVASc = 3 (HTN(1) & age(2)), so he's started on Xarelto 20 mg.      Patient's hyperlipidemia is controlled with diet and medications deferred for age. Last lipids were not at goal: Lab Results  Component Value Date   CHOL 213 (H) 02/04/2018   HDL 68 02/04/2018   LDLCALC 122 (H) 02/04/2018   TRIG 119 02/04/2018   CHOLHDL 3.1 02/04/2018      Patient has hx/o prediabetes (A1c 6.1% / 2014)  and patient denies reactive hypoglycemic symptoms, visual blurring, diabetic polys or paresthesias. Last A1c was Normal & at goal: Lab Results  Component Value Date   HGBA1C 5.2 11/02/2017       Finally, patient has history of Vitamin D Deficiency("42" / 2009)  and last vitamin D was still low (goal 70-100): Lab Results  Component Value Date   VD25OH 50 11/02/2017   Current Outpatient Medications on File Prior to Visit  Medication Sig  .  aspirin EC 81 MG tablet Take 81 mg by mouth daily.  . Cholecalciferol (VITAMIN D-3) 5000 UNITS TABS Take by mouth.  . dicyclomine (BENTYL) 20 MG tablet Take 1 tablet 3 x /day before Meals for Nausea, Bloating , Cramping or Diarrhea  . doxazosin (CARDURA) 8 MG tablet Take 1/2 to 1 tablet at Bedtime for Prostate & BP  . finasteride (PROSCAR) 5 MG tablet Take 1 tablet (5 mg total) by mouth daily.  . hyoscyamine (LEVSIN SL) 0.125 MG SL tablet DISSOLVE 1 TO 2 TABLETS UNDER THE TONGUE FOUR TIMES DAILY AS NEEDED FOR ABDOMINAL CRAMPS OR NAUSEA OR DIARRHEA  . Krill Oil 1000 MG CAPS Take 1,000 mg by mouth daily.  . LUTEIN PO Take 5 mg by mouth 2 (two) times daily.  . meloxicam (MOBIC) 15 MG tablet Take 1/2 to 1 tablet Daily with food for Pain & Inflammation  . neomycin-polymyxin-hydrocortisone (CORTISPORIN) 3.5-10000-1 OTIC suspension Place 4 drops into both ears 4 (four) times daily. (Patient taking differently: Place 4 drops into both ears 4 (four) times daily as needed. )  . tadalafil (CIALIS) 10 MG tablet 1/2-1 pill daily as directed.  . bisoprolol (ZEBETA) 5 MG tablet Take 1/2 to 1 tablet daily for BP and Pulse Rate (Patient taking differently: as needed. Take 1/2 to 1 tablet daily for BP and Pulse Rate)   No current facility-administered medications on file prior  to visit.    Allergies  Allergen Reactions  . Adhesive [Tape]   . Blueberry Flavor   . Celebrex [Celecoxib] Other (See Comments)    GI upset  . Lipitor [Atorvastatin]   . Nickel   . Ppd [Tuberculin Purified Protein Derivative] Other (See Comments)    Positive PPD   Past Medical History:  Diagnosis Date  . Allergic rhinitis   . Cancer [facility])    prostate  . DJD (degenerative joint disease)   . Hypertension    Borderline   . Sinus bradycardia   . Vitamin D deficiency    Health Maintenance  Topic Date Due  . TETANUS/TDAP  02/16/2018  . INFLUENZA VACCINE  09/17/2018  . PNA vac Low Risk Adult  Completed   Immunization History   Administered Date(s) Administered  . H1N1 02/17/2007  . Influenza Whole 12/05/2012  . Influenza, High Dose Seasonal PF 11/14/2013, 11/20/2014, 12/16/2015, 10/30/2016, 02/04/2018  . Influenza-Unspecified 12/05/2012  . Pneumococcal Conjugate-13 12/05/2014  . Pneumococcal Polysaccharide-23 03/08/2008, 05/18/2013  . Tdap 02/17/2008  . Zoster 02/16/2005   Past Surgical History:  Procedure Laterality Date  . APPENDECTOMY    . cervical neck mass   07/08/06   Left Excision left posterior cervical neck mass (2.5 cm). SURGEON:  Earnstine Regal, M.D., Allegra Grana 07/08/06 and 12/05/07  . FLEXIBLE SIGMOIDOSCOPY    . HEMORRHOID SURGERY    . JOINT REPLACEMENT     Left knee 2013  . KNEE SURGERY    . MASTOIDECTOMY Left    Family History  Problem Relation Age of Onset  . Stroke Mother   . Diabetes Mother   . Heart attack Father 28  . Hypertension Father   . Diabetes Maternal Aunt   . Diabetes Paternal Aunt   . Cancer Maternal Grandmother    Social History   Socioeconomic History  . Marital status: Widowed  . Number of children: 2 daughters & 2 GC  . Highest education level: Engineer  Occupational History  . Occupation: RETIRED PRESIDENT / Licensed conveyancer: [employer]  Tobacco Use  . Smoking status: Never Smoker  . Smokeless tobacco: Never Used  Substance and Sexual Activity  . Alcohol use: Yes    Alcohol/week: 10.0 standard drinks    Types: 10 Glasses of wine per week  . Drug use: No  . Sexual activity: Not on file  Social History Narrative   Left handed   Caffeine 1.5 cups daily    Lives at home alone Wife passed.     ROS Constitutional: Denies fever, chills, weight loss/gain, headaches, insomnia,  night sweats or change in appetite. Does c/o fatigue. Eyes: Denies redness, blurred vision, diplopia, discharge, itchy or watery eyes.  ENT: Denies discharge, congestion, post nasal drip, epistaxis, sore throat, earache, hearing loss, dental pain, Tinnitus, Vertigo, Sinus pain or  snoring.  Cardio: Denies chest pain, palpitations, irregular heartbeat, syncope, dyspnea, diaphoresis, orthopnea, PND, claudication or edema Respiratory: denies cough, dyspnea, DOE, pleurisy, hoarseness, laryngitis or wheezing.  Gastrointestinal: Denies dysphagia, heartburn, reflux, water brash, pain, cramps, nausea, vomiting, bloating, diarrhea, constipation, hematemesis, melena, hematochezia, jaundice or hemorrhoids Genitourinary: Denies dysuria, frequency, urgency, nocturia, hesitancy, discharge, hematuria or flank pain Musculoskeletal: Denies arthralgia, myalgia, stiffness, Jt. Swelling, pain, limp or strain/sprain. Denies Falls. Skin: Denies puritis, rash, hives, warts, acne, eczema or change in skin lesion Neuro: No weakness, tremor, incoordination, spasms, paresthesia or pain Psychiatric: Denies confusion, memory loss or sensory loss. Denies Depression. Endocrine: Denies change in weight, skin,  hair change, nocturia, and paresthesia, diabetic polys, visual blurring or hyper / hypo glycemic episodes.  Heme/Lymph: No excessive bleeding, bruising or enlarged lymph nodes.  Physical Exam  BP (!) 162/84   Pulse 76   Temp (!) 97.2 F (36.2 C)   Resp 16   Ht 6\' 2"  (1.88 m)   Wt 202 lb 3.2 oz (91.7 kg)   BMI 25.96 kg/m   General Appearance: Well nourished and well groomed and in no apparent distress.  Eyes: PERRLA, EOMs, conjunctiva no swelling or erythema, normal fundi and vessels. Sinuses: No frontal/maxillary tenderness ENT/Mouth: EACs patent / TMs  nl. Nares clear without erythema, swelling, mucoid exudates. Oral hygiene is good. No erythema, swelling, or exudate. Tongue normal, non-obstructing. Tonsils not swollen or erythematous. Hearing normal.  Neck: Supple, thyroid not palpable. No bruits, nodes or JVD. Respiratory: Respiratory effort normal.  BS equal and clear bilateral without rales, rhonci, wheezing or stridor. Cardio: Heart sounds are soft with irregular rate and rhythm and  no murmurs, rubs or gallops. Peripheral pulses are normal and equal bilaterally without edema. No aortic or femoral bruits. Chest: symmetric with normal excursions and percussion.  Abdomen: Soft, with Nl bowel sounds. Nontender, no guarding, rebound, hernias, masses, or organomegaly.  Lymphatics: Non tender without lymphadenopathy.  Musculoskeletal: Full ROM all peripheral extremities, joint stability, 5/5 strength, and normal gait. Skin: Warm and dry without rashes, lesions, cyanosis, clubbing or  ecchymosis.  Neuro: Cranial nerves intact, reflexes equal bilaterally. Normal muscle tone, no cerebellar symptoms. Sensation intact.  Pysch: Alert and oriented X 3 with normal affect, insight and judgment appropriate.   Assessment and Plan  1. Essential hypertension  - EKG 12-Lead - [place], RETROPERITNL ABD,  LTD - TSH  2. Atrial fibrillation, New  ([place])  - rivaroxaban (XARELTO) 20 MG TABS tablet; Take 1 tablet daily to Prevent blood Clots And Strokes  Dispense: 90 tablet; Refill: 3  - advised continue LD ASA 81 mg for now til sees cardiology consultant.  - for elevated  BP & new Afib , start ->  - diltiazem (CARTIA XT) 240 MG 24 hr capsule; Take 1 capsule Daily for BP & Heart Rhythm  Dispense: 90 capsule; Refill: 3  - Ambulatory referral to Cardiology  3. Hyperlipidemia, mixed  - EKG 12-Lead - [place], RETROPERITNL ABD,  LTD - TSH  4. Abnormal glucose  - EKG 12-Lead - [place], RETROPERITNL ABD,  LTD - Hemoglobin A1c - Insulin, random  5. Vitamin D deficiency  - VITAMIN D 25 Hydroxyl  6. Prediabetes  - EKG 12-Lead - [place], RETROPERITNL ABD,  LTD - Hemoglobin A1c - Insulin, random  7. Prostate cancer ([place])  - PSA  8. Screening for colorectal cancer  - POC Hemoccult Bld/Stl  9. Elevated prostate specific antigen (PSA)  - PSA  10. Screening for ischemic heart disease  - EKG 12-Lead - Lipid panel  11. FHx: heart disease  - EKG 12-Lead - [place], RETROPERITNL ABD,  LTD  12.  Screening for AAA (aortic abdominal aneurysm)  - [place], RETROPERITNL ABD,  LTD  13. Medication management  - Urinalysis, Routine w reflex microscopic - Microalbumin / creatinine urine ratio - CBC with Differential/Platelet - COMPLETE METABOLIC PANEL WITH GFR - Magnesium - Lipid panel - TSH - Hemoglobin A1c - Insulin, random - VITAMIN D 25 Hydroxyl          Long discussion with patient re: new Afib, implications & treatment and cautions with NOAC's He has a brother with hx/o Afib.  Patient was counseled in prudent diet, weight control to achieve/maintain BMI less than 25, BP monitoring, regular exercise and medications as discussed.  Discussed med effects and SE's. Routine screening labs and tests as requested with regular follow-up as recommended. Over 50 minutes of exam, counseling, chart review and high complex critical decision making was performed   Kirtland Bouchard, MD

## 2018-10-26 NOTE — Patient Instructions (Addendum)
Atrial Fibrillation Atrial fibrillation is a type of irregular or rapid heartbeat (arrhythmia). In atrial fibrillation, the top part of the heart (atria) quivers in a chaotic pattern. This makes the heart unable to pump blood normally. Having atrial fibrillation can increase your risk for other health problems, such as:  Blood can pool in the atria and form clots. If a clot travels to the brain, it can cause a stroke.  The heart muscle may weaken from the irregular blood flow. This can cause heart failure. Atrial fibrillation may start suddenly and stop on its own, or it may become a long-lasting problem. What are the causes? This condition is caused by some heart-related conditions or procedures, including:  High blood pressure. This is the most common cause.  Heart failure.  Heart valve conditions.  Inflammation of the sac that surrounds the heart (pericarditis).  Heart surgery.  Coronary artery disease.  Certain heart rhythm disorders, such as Wolf-Parkinson-White syndrome. Other causes include:  Pneumonia.  Obstructive sleep apnea.  Lung cancer.  Thyroid problems, especially if the thyroid is overactive (hyperthyroidism).  Excessive alcohol or drug use. Sometimes, the cause of this condition is not known. What increases the risk? This condition is more likely to develop in:  Older people.  People who smoke.  People who have diabetes mellitus.  People who are overweight (obese).  Athletes who exercise vigorously.  People who have a family history. What are the signs or symptoms? Symptoms of this condition include:  A feeling that your heart is beating rapidly or irregularly.  A feeling of discomfort or pain in your chest.  Shortness of breath.  Sudden light-headedness or weakness.  Getting tired easily during exercise. In some cases, there are no symptoms. How is this diagnosed? Your health care provider may be able to detect atrial fibrillation  when taking your pulse. If detected, this condition may be diagnosed with:  Electrocardiogram (ECG).  Ambulatory cardiac monitor. This device records your heartbeats for 24 hours or more.  Transthoracic echocardiogram (TTE) to evaluate how blood flows through your heart.  Transesophageal echocardiogram (TEE) to view more detailed images of your heart.  A stress test.  Imaging tests, such as a CT scan or chest X-ray.  Blood tests. How is this treated? This condition may be treated with:  Medicines to slow down the heart rate or bring the heart's rhythm back to normal.  Medicines to prevent blood clots from forming.  Electrical cardioversion. This delivers a low-energy shock to the heart to reset its rhythm.  Ablation. This procedure destroys the part of the heart tissue that sends abnormal signals.  Left atrial appendage occlusion/excision. This seals off a common place in the atria where blood clots can form (left atrial appendage). The goal of treatment is to prevent blood clots from forming and to keep your heart beating at a normal rate and rhythm. Treatment depends on underlying medical conditions and how you feel when you are experiencing fibrillation. Follow these instructions at home: Medicines  Take over-the counter and prescription medicines only as told by your health care provider.  If your health care provider prescribed a blood-thinning medicine (anticoagulant), take it exactly as told. Taking too much blood-thinning medicine can cause bleeding. Taking too little can enable a blood clot to form and travel to the brain, causing a stroke. Lifestyle      Do not use any products that contain nicotine or tobacco, such as cigarettes and e-cigarettes. If you need help quitting, ask your  health care provider.  Do not drink beverages that contain caffeine, such as coffee, soda, and tea.  Follow diet instructions as told by your health care provider.  Exercise  regularly as told by your health care provider.  Do not drink alcohol. General instructions  If you have obstructive sleep apnea, manage your condition as told by your health care provider.  Maintain a healthy weight. Do not use diet pills unless your health care provider approves. Diet pills may make heart problems worse.  Keep all follow-up visits as told by your health care provider. This is important. Contact a health care provider if you:  Notice a change in the rate, rhythm, or strength of your heartbeat.  Are taking an anticoagulant and you notice increased bruising.  Tire more easily when you exercise or exert yourself.  Have a sudden change in weight. Get help right away if you have:   Chest pain, abdominal pain, sweating, or weakness.  Difficulty breathing.  Blood in your vomit, stool (feces), or urine.  Any symptoms of a stroke. "BE FAST" is an easy way to remember the main warning signs of a stroke: ? B - Balance. Signs are dizziness, sudden trouble walking, or loss of balance. ? E - Eyes. Signs are trouble seeing or a sudden change in vision. ? F - Face. Signs are sudden weakness or numbness of the face, or the face or eyelid drooping on one side. ? A - Arms. Signs are weakness or numbness in an arm. This happens suddenly and usually on one side of the body. ? S - Speech. Signs are sudden trouble speaking, slurred speech, or trouble understanding what people say. ? T - Time. Time to call emergency services. Write down what time symptoms started.  Other signs of a stroke, such as: ? A sudden, severe headache with no known cause. ? Nausea or vomiting. ? Seizure. These symptoms may represent a serious problem that is an emergency. Do not wait to see if the symptoms will go away. Get medical help right away. Call your local emergency services (911 in the [place] Do not drive yourself to the hospital. Summary  Atrial fibrillation is a type of irregular or rapid  heartbeat (arrhythmia).  Symptoms include a feeling that your heart is beating fast or irregularly. In some cases, you may not have symptoms.  The condition is treated with medicines to slow down the heart rate or bring the heart's rhythm back to normal. You may also need blood-thinning medicines to prevent blood clots.  Get help right away if you have symptoms or signs of a stroke.  +++++++++++++++++++++++++++++++++++++++++++++++  Bleeding Precautions When on Anticoagulant Therapy  Anticoagulant therapy, also called blood thinner therapy, is medicine that helps to prevent and treat blood clots. The medicine works by stopping blood clots from forming or growing. Blood clots that form in your blood vessels can be dangerous. They can break loose and travel to the heart, lungs, or brain. This increases the risk of a heart attack, stroke, or blocked lung artery (pulmonary embolism). Anticoagulants also increase the risk of bleeding. Try to protect yourself from cuts and other injuries that can cause bleeding. It is important to take anticoagulants exactly as told by your health care provider.  Why do I need to be on anticoagulant therapy? You may need this medicine if you are at risk of developing a blood clot. Conditions that increase your risk of a blood clot include:    Having a high risk  of stroke or heart attack.  Having atrial fibrillation (AF).   What are the common anticoagulant medicines? There are several types of anticoagulant medicines. The most common types are:   Medicines that you take by mouth (oral medicines), such as: ?  ? Novel oral anticoagulants (NOACs), such as: ? Direct thrombin inhibitors (dabigatran). ? Factor Xa inhibitors  Xarelto =  rivaroxaba ?  These anticoagulants work in different ways to prevent blood clots. They also have different risks and side effects.  What do I need to remember while on anticoagulant therapy?  Taking anticoagulants   Take  your medicine at the same time every day. If you forget to take your medicine, take it as soon as you remember. Do not double your dosage of medicine if you miss a whole day. Take your normal dose and call your health care provider.  Do not stop taking your medicine unless your health care provider approves. Stopping the medicine can increase your risk of developing a blood clot.   Taking other medicines - Stop MELOXICAM   Take over-the-counter and prescriptions medicines only as told by your health care provider.  Do not take over-the-counter NSAIDs, including aspirin and ibuprofen, while you are on anticoagulant therapy. These medicines increase your risk of dangerous bleeding.   Get approval from your health care provider before you start taking any new medicines, vitamins, or herbal products. Some of these could interfere with your therapy.   General instructions  Keep all follow-up visits as told by your health care provider. This is important.  If you are pregnant or trying to get pregnant, talk with a health care provider about anticoagulants. Some of these medicines are not safe to take during pregnancy.  Tell all health care providers, including your dentist, that you are on anticoagulant therapy. It is especially important to tell providers before you have any surgery, medical procedures, or dental work done.   What precautions should I take?   Be very careful when using knives, scissors, or other sharp objects.  Use an electric razor instead of a blade.  Do not use toothpicks.  Use a soft-bristled toothbrush. Brush your teeth gently.  Always wear shoes outdoors and wear slippers indoors.  Be careful when cutting your fingernails and toenails.  Place bath mats in the bathroom. If possible, install handrails as well.  Wear gloves while you do yard work.  Wear your seat belt.  Prevent falls by removing loose rugs and extension cords from areas where you walk. Use  a cane or walker if you need it.    Avoid constipation by: ? Drinking enough fluid to keep your urine clear or pale yellow. ? Eating foods that are high in fiber, such as fresh fruits and vegetables, whole grains, and beans. ? Limiting foods that are high in fat and processed sugars, such as fried and sweet foods.  Do not play contact sports or participate in other activities that have a high risk for injury. What other precautions are important if on warfarin therapy? If you are taking a type of anticoagulant called warfarin, make sure you:  Work with a diet and nutrition specialist (dietitian) to make an eating plan. Do not make any sudden changes to your diet after you have started your eating plan.  Do not drink alcohol. It can interfere with your medicine and increase your risk of an injury that causes bleeding.  Get regular blood tests as told by your health care provider.   What  are some questions to ask my health care provider?  Why do I need anticoagulant therapy?  What is the best anticoagulant therapy for my condition?  How long will I need anticoagulant therapy?  What are the side effects of anticoagulant therapy?  When should I take my medicine? What should I do if I forget to take it?  Will I need to have regular blood tests?  Do I need to change my diet? Are there foods or drinks that I should avoid?  What activities are safe for me? What should I do if I want to get pregnant?  Contact a health care provider if:  You miss a dose of medicine: ? And you are not sure what to do. ? For more than one day.  You have: ? Menstrual bleeding that is heavier than normal. ? Bloody or brown urine. ? Easy bruising. ? Black and tarry stool or bright red stool. ? Side effects from your medicine.  You feel weak or dizzy.   Get help right away if:  You have bleeding that will not stop within 20 minutes from: ? The nose. ? The gums. ? A cut on the skin.  You  have a severe headache or stomachache.  You vomit or cough up blood.  You fall or hit your head. Summary  Anticoagulant therapy, also called blood thinner therapy, is medicine that helps to prevent and treat blood clots.  Anticoagulants work in different ways to prevent blood clots. They also have different risks and side effects.  Talk with your health care provider about any precautions that you should take while on anticoagulant therapy.  +++++++++++++++++++++++++++++++++++++  Vit D  & Vit C 1,000 mg   are recommended to help protect  against the Covid-19 and other Corona viruses.    Also it's recommended  to take  Zinc 50 mg  to help  protect against the Covid-19   and best place to get  is also on [employer]  and don't pay more than 6-8 cents /pill !  ================================ Coronavirus (COVID-19) Are you at risk?  Are you at risk for the Coronavirus (COVID-19)?  To be considered HIGH RISK for Coronavirus (COVID-19), you have to meet the following criteria:  . Traveled to [place], [place], [place], [place] or [place]; or in the [place] to [place], [place], [place]  . or [place]; and have fever, cough, and shortness of breath within the last 2 weeks of travel OR . Been in close contact with a person diagnosed with COVID-19 within the last 2 weeks and have  . fever, cough,and shortness of breath .  . IF YOU DO NOT MEET THESE CRITERIA, YOU ARE CONSIDERED LOW RISK FOR COVID-19.  What to do if you are HIGH RISK for COVID-19?  Marland Kitchen If you are having a medical emergency, call 911. . Seek medical care right away. Before you go to a doctor's office, urgent care or emergency department, .  call ahead and tell them about your recent travel, contact with someone diagnosed with COVID-19  .  and your symptoms.  . You should receive instructions from your physician's office regarding next steps of care.  . When you arrive at healthcare provider, tell the  healthcare staff immediately you have returned from  . visiting [place], [place], [place], [place] or [place]; or traveled in the [place] to [place], [place],  . [place] or [place] in the last two weeks or you have been  in close contact with a person diagnosed with  . COVID-19 in the last 2 weeks.   . Tell the health care staff about your symptoms: fever, cough and shortness of breath. . After you have been seen by a medical provider, you will be either: o Tested for (COVID-19) and discharged home on quarantine except to seek medical care if  o symptoms worsen, and asked to  - Stay home and avoid contact with others until you get your results (4-5 days)  - Avoid travel on public transportation if possible (such as bus, train, or airplane) or o Sent to the Emergency Department by EMS for evaluation, COVID-19 testing  and  o possible admission depending on your condition and test results.  What to do if you are LOW RISK for COVID-19?  Reduce your risk of any infection by using the same precautions used for avoiding the common cold or flu:  Marland Kitchen Wash your hands often with soap and warm water for at least 20 seconds.  If soap and water are not readily available,  . use an alcohol-based hand sanitizer with at least 60% alcohol.  . If coughing or sneezing, cover your mouth and nose by coughing or sneezing into the elbow areas of your shirt or coat, .  into a tissue or into your sleeve (not your hands). . Avoid shaking hands with others and consider head nods or verbal greetings only. . Avoid touching your eyes, nose, or mouth with unwashed hands.  . Avoid close contact with people who are sick. . Avoid places or events with large numbers of people in one location, like concerts or sporting events. . Carefully consider travel plans you have or are making. . If you are planning any travel outside or inside the [place], visit the CDC's Travelers' Health webpage for the latest health  notices. . If you have some symptoms but not all symptoms, continue to monitor at home and seek medical attention  . if your symptoms worsen. . If you are having a medical emergency, call 911. >>>>>>>>>>>>>>>>>>>>>>>>>>>>>>>>>>>>>>>>>>>>>>>>>>>>>>> We Do NOT Approve of  [facility] [place] Soliciting Our Patients  To Do Home Visits  & We Do NOT Approve of [employer] > > > > > > > > > > > > > > > > > > > > > > > > > > > > > > > > > > >  > > > >   Preventive Care for Adults  A healthy lifestyle and preventive care can promote health and wellness. Preventive health guidelines for men include the following key practices:  A routine yearly physical is a good way to check with your health care provider about your health and preventative screening. It is a chance to share any concerns and updates on your health and to receive a thorough exam.  Visit your dentist for a routine exam and preventative care every 6 months. Brush your teeth twice a day and floss once a day. Good oral hygiene prevents tooth decay and gum disease.  The frequency of eye exams is based on your age, health, family medical history, use of contact lenses, and other factors. Follow your health care provider's recommendations for frequency of eye exams.  Eat a healthy diet. Foods such as vegetables, fruits, whole grains, low-fat dairy products, and lean protein foods contain the nutrients you need without too many calories. Decrease your intake of foods high in solid fats, added sugars, and salt. Eat the right amount  of calories for you. Get information about a proper diet from your health care provider, if necessary.  Regular physical exercise is one of the most important things you can do for your health. Most adults should get at least 150 minutes of moderate-intensity exercise (any activity that increases your heart rate and causes you to sweat) each week. In addition, most adults need  muscle-strengthening exercises on 2 or more days a week.  Maintain a healthy weight. The body mass index (BMI) is a screening tool to identify possible weight problems. It provides an estimate of body fat based on height and weight. Your health care provider can find your BMI and can help you achieve or maintain a healthy weight. For adults 20 years and older:  A BMI below 18.5 is considered underweight.  A BMI of 18.5 to 24.9 is normal.  A BMI of 25 to 29.9 is considered overweight.  A BMI of 30 and above is considered obese.  Maintain normal blood lipids and cholesterol levels by exercising and minimizing your intake of saturated fat. Eat a balanced diet with plenty of fruit and vegetables. Blood tests for lipids and cholesterol should begin at age 22 and be repeated every 5 years. If your lipid or cholesterol levels are high, you are over 50, or you are at high risk for heart disease, you may need your cholesterol levels checked more frequently. Ongoing high lipid and cholesterol levels should be treated with medicines if diet and exercise are not working.  If you smoke, find out from your health care provider how to quit. If you do not use tobacco, do not start.  Lung cancer screening is recommended for adults aged 46-80 years who are at high risk for developing lung cancer because of a history of smoking. A yearly low-dose CT scan of the lungs is recommended for people who have at least a 30-pack-year history of smoking and are a current smoker or have quit within the past 15 years. A pack year of smoking is smoking an average of 1 pack of cigarettes a day for 1 year (for example: 1 pack a day for 30 years or 2 packs a day for 15 years). Yearly screening should continue until the smoker has stopped smoking for at least 15 years. Yearly screening should be stopped for people who develop a health problem that would prevent them from having lung cancer treatment.  If you choose to drink alcohol,  do not have more than 2 drinks per day. One drink is considered to be 12 ounces (355 mL) of beer, 5 ounces (148 mL) of wine, or 1.5 ounces (44 mL) of liquor.  Avoid use of street drugs. Do not share needles with anyone. Ask for help if you need support or instructions about stopping the use of drugs.  High blood pressure causes heart disease and increases the risk of stroke. Your blood pressure should be checked at least every 1-2 years. Ongoing high blood pressure should be treated with medicines, if weight loss and exercise are not effective.  If you are 56-36 years old, ask your health care provider if you should take aspirin to prevent heart disease.  Diabetes screening involves taking a blood sample to check your fasting blood sugar level. Testing should be considered at a younger age or be carried out more frequently if you are overweight and have at least 1 risk factor for diabetes.  Colorectal cancer can be detected and often prevented. Most routine colorectal cancer screening  begins at the age of 74 and continues through age 74. However, your health care provider may recommend screening at an earlier age if you have risk factors for colon cancer. On a yearly basis, your health care provider may provide home test kits to check for hidden blood in the stool. Use of a small camera at the end of a tube to directly examine the colon (sigmoidoscopy or colonoscopy) can detect the earliest forms of colorectal cancer. Talk to your health care provider about this at age 17, when routine screening begins. Direct exam of the colon should be repeated every 5-10 years through age 38, unless early forms of precancerous polyps or small growths are found.  Hepatitis C blood testing is recommended for all people born from [birth year] through 1963 and any individual with known risks for hepatitis C.  Screening for abdominal aortic aneurysm (AAA)  by ultrasound is recommended for people who have history of high blood  pressure or who are current or former smokers.  Healthy men should  receive prostate-specific antigen (PSA) blood tests as part of routine cancer screening. Talk with your health care provider about prostate cancer screening.  Testicular cancer screening is  recommended for adult males. Screening includes self-exam, a health care provider exam, and other screening tests. Consult with your health care provider about any symptoms you have or any concerns you have about testicular cancer.  Use sunscreen. Apply sunscreen liberally and repeatedly throughout the day. You should seek shade when your shadow is shorter than you. Protect yourself by wearing long sleeves, pants, a wide-brimmed hat, and sunglasses year round, whenever you are outdoors.  Once a month, do a whole-body skin exam, using a mirror to look at the skin on your back. Tell your health care provider about new moles, moles that have irregular borders, moles that are larger than a pencil eraser, or moles that have changed in shape or color.  Stay current with required vaccines (immunizations).  Influenza vaccine. All adults should be immunized every year.  Tetanus, diphtheria, and acellular pertussis (Td, Tdap) vaccine. An adult who has not previously received Tdap or who does not know his vaccine status should receive 1 dose of Tdap. This initial dose should be followed by tetanus and diphtheria toxoids (Td) booster doses every 10 years. Adults with an unknown or incomplete history of completing a 3-dose immunization series with Td-containing vaccines should begin or complete a primary immunization series including a Tdap dose. Adults should receive a Td booster every 10 years.  Zoster vaccine. One dose is recommended for adults aged 45 years or older unless certain conditions are present.    PREVNAR - Pneumococcal 13-valent conjugate (PCV13) vaccine. When indicated, a person who is uncertain of his immunization history and has no  record of immunization should receive the PCV13 vaccine. An adult aged 35 years or older who has certain medical conditions and has not been previously immunized should receive 1 dose of PCV13 vaccine. This PCV13 should be followed with a dose of pneumococcal polysaccharide (PPSV23) vaccine. The PPSV23 vaccine dose should be obtained 1 or more year(s)after the dose of PCV13 vaccine. An adult aged 71 years or older who has certain medical conditions and previously received 1 or more doses of PPSV23 vaccine should receive 1 dose of PCV13. The PCV13 vaccine dose should be obtained 1 or more years after the last PPSV23 vaccine dose.    PNEUMOVAX - Pneumococcal polysaccharide (PPSV23) vaccine. When PCV13 is also indicated, PCV13 should be  obtained first. All adults aged 40 years and older should be immunized. An adult younger than age 30 years who has certain medical conditions should be immunized. Any person who resides in a nursing home or long-term care facility should be immunized. An adult smoker should be immunized. People with an immunocompromised condition and certain other conditions should receive both PCV13 and PPSV23 vaccines. People with human immunodeficiency virus (HIV) infection should be immunized as soon as possible after diagnosis. Immunization during chemotherapy or radiation therapy should be avoided. Routine use of PPSV23 vaccine is not recommended for American Indians, [place] Natives, or people younger than 65 years unless there are medical conditions that require PPSV23 vaccine. When indicated, people who have unknown immunization and have no record of immunization should receive PPSV23 vaccine. One-time revaccination 5 years after the first dose of PPSV23 is recommended for people aged 19-64 years who have chronic kidney failure, nephrotic syndrome, asplenia, or immunocompromised conditions. People who received 1-2 doses of PPSV23 before age 40 years should receive another dose of PPSV23  vaccine at age 49 years or later if at least 5 years have passed since the previous dose. Doses of PPSV23 are not needed for people immunized with PPSV23 at or after age 76 years.    Hepatitis A vaccine. Adults who wish to be protected from this disease, have certain high-risk conditions, work with hepatitis A-infected animals, work in hepatitis A research labs, or travel to or work in countries with a high rate of hepatitis A should be immunized. Adults who were previously unvaccinated and who anticipate close contact with an international adoptee during the first 60 days after arrival in the [place] from a country with a high rate of hepatitis A should be immunized.    Hepatitis B vaccine. Adults should be immunized if they wish to be protected from this disease, have certain high-risk conditions, may be exposed to blood or other infectious body fluids, are household contacts or sex partners of hepatitis B positive people, are clients or workers in certain care facilities, or travel to or work in countries with a high rate of hepatitis B.   Preventive Service / Frequency   Ages 43 and over  Blood pressure check.  Lipid and cholesterol check.  Lung cancer screening. / Every year if you are aged 24-80 years and have a 30-pack-year history of smoking and currently smoke or have quit within the past 15 years. Yearly screening is stopped once you have quit smoking for at least 15 years or develop a health problem that would prevent you from having lung cancer treatment.  Fecal occult blood test (FOBT) of stool. You may not have to do this test if you get a colonoscopy every 10 years.  Flexible sigmoidoscopy** or colonoscopy.** / Every 5 years for a flexible sigmoidoscopy or every 10 years for a colonoscopy beginning at age 104 and continuing until age 70.  Hepatitis C blood test.** / For all people born from [birth year] through 1963 and any individual with known risks for hepatitis  C.  Abdominal aortic aneurysm (AAA) screening./ Screening current or former smokers or have Hypertension.  Skin self-exam. / Monthly.  Influenza vaccine. / Every year.  Tetanus, diphtheria, and acellular pertussis (Tdap/Td) vaccine.** / 1 dose of Td every 10 years.   Zoster vaccine.** / 1 dose for adults aged 59 years or older.         Pneumococcal 13-valent conjugate (PCV13) vaccine.    Pneumococcal polysaccharide (PPSV23) vaccine.  Hepatitis A vaccine.** / Consult your health care provider.  Hepatitis B vaccine.** / Consult your health care provider. Screening for abdominal aortic aneurysm (AAA)  by ultrasound is recommended for people who have history of high blood pressure or who are current or former smokers. ++++++++++ Recommend Adult Low Dose Aspirin or  coated  Aspirin 81 mg daily  To reduce risk of Colon Cancer 40 %,  Skin Cancer 26 % ,  Malignant Melanoma 46%  and  Pancreatic cancer 60% ++++++++++++++++++++++ Vitamin D goal  is between 70-100.  Please make sure that you are taking your Vitamin D as directed.  It is very important as a natural anti-inflammatory  helping hair, skin, and nails, as well as reducing stroke and heart attack risk.  It helps your bones and helps with mood. It also decreases numerous cancer risks so please take it as directed.  Low Vit D is associated with a 200-300% higher risk for CANCER  and 200-300% higher risk for HEART   ATTACK  &  STROKE.   .....................................Marland Kitchen It is also associated with higher death rate at younger ages,  autoimmune diseases like Rheumatoid arthritis, Lupus, Multiple Sclerosis.    Also many other serious conditions, like depression, Alzheimer's Dementia, infertility, muscle aches, fatigue, fibromyalgia - just to name a few. ++++++++++++++++++++++ Recommend the book "The END of DIETING" by Dr Excell Seltzer  & the book "The END of DIABETES " by Dr Excell Seltzer At [email or web address] - get book &  Audio CD's    Being diabetic has a  300% increased risk for heart attack, stroke, cancer, and alzheimer- type vascular dementia. It is very important that you work harder with diet by avoiding all foods that are white. Avoid white rice (brown & wild rice is OK), white potatoes (sweetpotatoes in moderation is OK), White bread or wheat bread or anything made out of white flour like bagels, donuts, rolls, buns, biscuits, cakes, pastries, cookies, pizza crust, and pasta (made from white flour & egg whites) - vegetarian pasta or spinach or wheat pasta is OK. Multigrain breads like Arnold's or [employer], or multigrain sandwich thins or flatbreads.  Diet, exercise and weight loss can reverse and cure diabetes in the early stages.  Diet, exercise and weight loss is very important in the control and prevention of complications of diabetes which affects every system in your body, ie. Brain - dementia/stroke, eyes - glaucoma/blindness, heart - heart attack/heart failure, kidneys - dialysis, stomach - gastric paralysis, intestines - malabsorption, nerves - severe painful neuritis, circulation - gangrene & loss of a leg(s), and finally cancer and Alzheimers.    I recommend avoid fried & greasy foods,  sweets/candy, white rice (brown or wild rice or Quinoa is OK), white potatoes (sweet potatoes are OK) - anything made from white flour - bagels, doughnuts, rolls, buns, biscuits,white and wheat breads, pizza crust and traditional pasta made of white flour & egg white(vegetarian pasta or spinach or wheat pasta is OK).  Multi-grain bread is OK - like multi-grain flat bread or sandwich thins. Avoid alcohol in excess. Exercise is also important.    Eat all the vegetables you want - avoid meat, especially red meat and dairy - especially cheese.  Cheese is the most concentrated form of trans-fats which is the worst thing to clog up our arteries. Veggie cheese is OK which can be found in the fresh produce section at  [employer] or [employer] or [employer]  ++++++++++++++++++++++ DASH Eating Plan  DASH stands for "  Dietary Approaches to Stop Hypertension."   The DASH eating plan is a healthy eating plan that has been shown to reduce high blood pressure (hypertension). Additional health benefits may include reducing the risk of type 2 diabetes mellitus, heart disease, and stroke. The DASH eating plan may also help with weight loss. WHAT DO I NEED TO KNOW ABOUT THE DASH EATING PLAN? For the DASH eating plan, you will follow these general guidelines:  Choose foods with a percent daily value for sodium of less than 5% (as listed on the food label).  Use salt-free seasonings or herbs instead of table salt or sea salt.  Check with your health care provider or pharmacist before using salt substitutes.  Eat lower-sodium products, often labeled as "lower sodium" or "no salt added."  Eat fresh foods.  Eat more vegetables, fruits, and low-fat dairy products.  Choose whole grains. Look for the word "whole" as the first word in the ingredient list.  Choose fish   Limit sweets, desserts, sugars, and sugary drinks.  Choose heart-healthy fats.  Eat veggie cheese   Eat more home-cooked food and less restaurant, buffet, and fast food.  Limit fried foods.  Cook foods using methods other than frying.  Limit canned vegetables. If you do use them, rinse them well to decrease the sodium.  When eating at a restaurant, ask that your food be prepared with less salt, or no salt if possible.                      WHAT FOODS CAN I EAT? Read Dr Fara Olden Fuhrman's books on The End of Dieting & The End of Diabetes  Grains Whole grain or whole wheat bread. Brown rice. Whole grain or whole wheat pasta. Quinoa, bulgur, and whole grain cereals. Low-sodium cereals. Corn or whole wheat flour tortillas. Whole grain cornbread. Whole grain crackers. Low-sodium crackers.  Vegetables Fresh or frozen vegetables (raw, steamed,  roasted, or grilled). Low-sodium or reduced-sodium tomato and vegetable juices. Low-sodium or reduced-sodium tomato sauce and paste. Low-sodium or reduced-sodium canned vegetables.   Fruits All fresh, canned (in natural juice), or frozen fruits.  Protein Products  All fish and seafood.  Dried beans, peas, or lentils. Unsalted nuts and seeds. Unsalted canned beans.  Dairy Low-fat dairy products, such as skim or 1% milk, 2% or reduced-fat cheeses, low-fat ricotta or cottage cheese, or plain low-fat yogurt. Low-sodium or reduced-sodium cheeses.  Fats and Oils Tub margarines without trans fats. Light or reduced-fat mayonnaise and salad dressings (reduced sodium). Avocado. Safflower, olive, or canola oils. Natural peanut or almond butter.  Other Unsalted popcorn and pretzels. The items listed above may not be a complete list of recommended foods or beverages. Contact your dietitian for more options.  ++++++++++++++++++++  WHAT FOODS ARE NOT RECOMMENDED? Grains/ White flour or wheat flour White bread. White pasta. White rice. Refined cornbread. Bagels and croissants. Crackers that contain trans fat.  Vegetables  Creamed or fried vegetables. Vegetables in a . Regular canned vegetables. Regular canned tomato sauce and paste. Regular tomato and vegetable juices.  Fruits Dried fruits. Canned fruit in light or heavy syrup. Fruit juice.  Meat and Other Protein Products Meat in general - RED meat & White meat.  Fatty cuts of meat. Ribs, chicken wings, all processed meats as bacon, sausage, bologna, salami, fatback, hot dogs, bratwurst and packaged luncheon meats.  Dairy Whole or 2% milk, cream, half-and-half, and cream cheese. Whole-fat or sweetened yogurt. Full-fat cheeses or blue cheese. Non-dairy  creamers and whipped toppings. Processed cheese, cheese spreads, or cheese curds.  Condiments Onion and garlic salt, seasoned salt, table salt, and sea salt. Canned and packaged gravies.  Worcestershire sauce. Tartar sauce. Barbecue sauce. Teriyaki sauce. Soy sauce, including reduced sodium. Steak sauce. Fish sauce. Oyster sauce. Cocktail sauce. Horseradish. Ketchup and mustard. Meat flavorings and tenderizers. Bouillon cubes. Hot sauce. Tabasco sauce. Marinades. Taco seasonings. Relishes.  Fats and Oils Butter, stick margarine, lard, shortening and bacon fat. Coconut, palm kernel, or palm oils. Regular salad dressings.  Pickles and olives. Salted popcorn and pretzels.  The items listed above may not be a complete list of foods and beverages to avoid.    Vit D  & Vit C 1,000 mg   are recommended to help protect  against the Covid-19 and other Corona viruses.    Also it's recommended  to take  Zinc 50 mg  to help  protect against the Covid-19   and best place to get  is also on [email or web address]  and don't pay more than 6-8 cents /pill !  ================================ Coronavirus (COVID-19) Are you at risk?  Are you at risk for the Coronavirus (COVID-19)?  To be considered HIGH RISK for Coronavirus (COVID-19), you have to meet the following criteria:  . Traveled to [place], [place], [place], [place] or [place]; or in the [place] to [place], [place], [place]  . or [place]; and have fever, cough, and shortness of breath within the last 2 weeks of travel OR . Been in close contact with a person diagnosed with COVID-19 within the last 2 weeks and have  . fever, cough,and shortness of breath .  . IF YOU DO NOT MEET THESE CRITERIA, YOU ARE CONSIDERED LOW RISK FOR COVID-19.  What to do if you are HIGH RISK for COVID-19?  Marland Kitchen If you are having a medical emergency, call 911. . Seek medical care right away. Before you go to a doctor's office, urgent care or emergency department, .  call ahead and tell them about your recent travel, contact with someone diagnosed with COVID-19  .  and your symptoms.  . You should receive instructions from your physician's office  regarding next steps of care.  . When you arrive at healthcare provider, tell the healthcare staff immediately you have returned from  . visiting [place], [place], [place], [place] or [place]; or traveled in the [place] to [place], [place],  . [place] or [place] in the last two weeks or you have been in close contact with a person diagnosed with  . COVID-19 in the last 2 weeks.   . Tell the health care staff about your symptoms: fever, cough and shortness of breath. . After you have been seen by a medical provider, you will be either: o Tested for (COVID-19) and discharged home on quarantine except to seek medical care if  o symptoms worsen, and asked to  - Stay home and avoid contact with others until you get your results (4-5 days)  - Avoid travel on public transportation if possible (such as bus, train, or airplane) or o Sent to the Emergency Department by EMS for evaluation, COVID-19 testing  and  o possible admission depending on your condition and test results.  What to do if you are LOW RISK for COVID-19?  Reduce your risk of any infection by using the same precautions used for avoiding the common cold or flu:  Marland Kitchen Wash your hands often with soap and warm  water for at least 20 seconds.  If soap and water are not readily available,  . use an alcohol-based hand sanitizer with at least 60% alcohol.  . If coughing or sneezing, cover your mouth and nose by coughing or sneezing into the elbow areas of your shirt or coat, .  into a tissue or into your sleeve (not your hands). . Avoid shaking hands with others and consider head nods or verbal greetings only. . Avoid touching your eyes, nose, or mouth with unwashed hands.  . Avoid close contact with people who are sick. . Avoid places or events with large numbers of people in one location, like concerts or sporting events. . Carefully consider travel plans you have or are making. . If you are planning any travel outside or inside  the [place], visit the CDC's Travelers' Health webpage for the latest health notices. . If you have some symptoms but not all symptoms, continue to monitor at home and seek medical attention  . if your symptoms worsen. . If you are having a medical emergency, call 911. >>>>>>>>>>>>>>>>>>>>>>>>>>>>>>>>>>>>>>>>>>>>>>>>>>>>>>> We Do NOT Approve of  [facility] [place] Soliciting Our Patients  To Do Home Visits  & We Do NOT Approve of [employer] > > > > > > > > > > > > > > > > > > > > > > > > > > > > > > > > > > >  > > > >   Preventive Care for Adults  A healthy lifestyle and preventive care can promote health and wellness. Preventive health guidelines for men include the following key practices:  A routine yearly physical is a good way to check with your health care provider about your health and preventative screening. It is a chance to share any concerns and updates on your health and to receive a thorough exam.  Visit your dentist for a routine exam and preventative care every 6 months. Brush your teeth twice a day and floss once a day. Good oral hygiene prevents tooth decay and gum disease.  The frequency of eye exams is based on your age, health, family medical history, use of contact lenses, and other factors. Follow your health care provider's recommendations for frequency of eye exams.  Eat a healthy diet. Foods such as vegetables, fruits, whole grains, low-fat dairy products, and lean protein foods contain the nutrients you need without too many calories. Decrease your intake of foods high in solid fats, added sugars, and salt. Eat the right amount of calories for you. Get information about a proper diet from your health care provider, if necessary.  Regular physical exercise is one of the most important things you can do for your health. Most adults should get at least 150 minutes of moderate-intensity exercise (any activity that increases your heart rate and causes  you to sweat) each week. In addition, most adults need muscle-strengthening exercises on 2 or more days a week.  Maintain a healthy weight. The body mass index (BMI) is a screening tool to identify possible weight problems. It provides an estimate of body fat based on height and weight. Your health care provider can find your BMI and can help you achieve or maintain a healthy weight. For adults 20 years and older:  A BMI below 18.5 is considered underweight.  A BMI of 18.5 to 24.9 is normal.  A BMI of 25 to 29.9 is considered overweight.  A BMI of 30 and above is considered obese.  Maintain normal blood lipids and cholesterol levels by exercising and minimizing your intake of saturated fat. Eat a balanced diet with plenty of fruit and vegetables. Blood tests for lipids and cholesterol should begin at age 43 and be repeated every 5 years. If your lipid or cholesterol levels are high, you are over 50, or you are at high risk for heart disease, you may need your cholesterol levels checked more frequently. Ongoing high lipid and cholesterol levels should be treated with medicines if diet and exercise are not working.  If you smoke, find out from your health care provider how to quit. If you do not use tobacco, do not start.  Lung cancer screening is recommended for adults aged 18-80 years who are at high risk for developing lung cancer because of a history of smoking. A yearly low-dose CT scan of the lungs is recommended for people who have at least a 30-pack-year history of smoking and are a current smoker or have quit within the past 15 years. A pack year of smoking is smoking an average of 1 pack of cigarettes a day for 1 year (for example: 1 pack a day for 30 years or 2 packs a day for 15 years). Yearly screening should continue until the smoker has stopped smoking for at least 15 years. Yearly screening should be stopped for people who develop a health problem that would prevent them from having  lung cancer treatment.  If you choose to drink alcohol, do not have more than 2 drinks per day. One drink is considered to be 12 ounces (355 mL) of beer, 5 ounces (148 mL) of wine, or 1.5 ounces (44 mL) of liquor.  Avoid use of street drugs. Do not share needles with anyone. Ask for help if you need support or instructions about stopping the use of drugs.  High blood pressure causes heart disease and increases the risk of stroke. Your blood pressure should be checked at least every 1-2 years. Ongoing high blood pressure should be treated with medicines, if weight loss and exercise are not effective.  If you are 76-86 years old, ask your health care provider if you should take aspirin to prevent heart disease.  Diabetes screening involves taking a blood sample to check your fasting blood sugar level. Testing should be considered at a younger age or be carried out more frequently if you are overweight and have at least 1 risk factor for diabetes.  Colorectal cancer can be detected and often prevented. Most routine colorectal cancer screening begins at the age of 32 and continues through age 10. However, your health care provider may recommend screening at an earlier age if you have risk factors for colon cancer. On a yearly basis, your health care provider may provide home test kits to check for hidden blood in the stool. Use of a small camera at the end of a tube to directly examine the colon (sigmoidoscopy or colonoscopy) can detect the earliest forms of colorectal cancer. Talk to your health care provider about this at age 22, when routine screening begins. Direct exam of the colon should be repeated every 5-10 years through age 46, unless early forms of precancerous polyps or small growths are found.  Hepatitis C blood testing is recommended for all people born from [birth year] through 1963 and any individual with known risks for hepatitis C.  Screening for abdominal aortic aneurysm (AAA)  by ultrasound  is recommended for people who have history of high blood pressure or who  are current or former smokers.  Healthy men should  receive prostate-specific antigen (PSA) blood tests as part of routine cancer screening. Talk with your health care provider about prostate cancer screening.  Testicular cancer screening is  recommended for adult males. Screening includes self-exam, a health care provider exam, and other screening tests. Consult with your health care provider about any symptoms you have or any concerns you have about testicular cancer.  Use sunscreen. Apply sunscreen liberally and repeatedly throughout the day. You should seek shade when your shadow is shorter than you. Protect yourself by wearing long sleeves, pants, a wide-brimmed hat, and sunglasses year round, whenever you are outdoors.  Once a month, do a whole-body skin exam, using a mirror to look at the skin on your back. Tell your health care provider about new moles, moles that have irregular borders, moles that are larger than a pencil eraser, or moles that have changed in shape or color.  Stay current with required vaccines (immunizations).  Influenza vaccine. All adults should be immunized every year.  Tetanus, diphtheria, and acellular pertussis (Td, Tdap) vaccine. An adult who has not previously received Tdap or who does not know his vaccine status should receive 1 dose of Tdap. This initial dose should be followed by tetanus and diphtheria toxoids (Td) booster doses every 10 years. Adults with an unknown or incomplete history of completing a 3-dose immunization series with Td-containing vaccines should begin or complete a primary immunization series including a Tdap dose. Adults should receive a Td booster every 10 years.  Zoster vaccine. One dose is recommended for adults aged 18 years or older unless certain conditions are present.    PREVNAR - Pneumococcal 13-valent conjugate (PCV13) vaccine. When indicated, a person who  is uncertain of his immunization history and has no record of immunization should receive the PCV13 vaccine. An adult aged 24 years or older who has certain medical conditions and has not been previously immunized should receive 1 dose of PCV13 vaccine. This PCV13 should be followed with a dose of pneumococcal polysaccharide (PPSV23) vaccine. The PPSV23 vaccine dose should be obtained 1 or more year(s)after the dose of PCV13 vaccine. An adult aged 41 years or older who has certain medical conditions and previously received 1 or more doses of PPSV23 vaccine should receive 1 dose of PCV13. The PCV13 vaccine dose should be obtained 1 or more years after the last PPSV23 vaccine dose.    PNEUMOVAX - Pneumococcal polysaccharide (PPSV23) vaccine. When PCV13 is also indicated, PCV13 should be obtained first. All adults aged 64 years and older should be immunized. An adult younger than age 80 years who has certain medical conditions should be immunized. Any person who resides in a nursing home or long-term care facility should be immunized. An adult smoker should be immunized. People with an immunocompromised condition and certain other conditions should receive both PCV13 and PPSV23 vaccines. People with human immunodeficiency virus (HIV) infection should be immunized as soon as possible after diagnosis. Immunization during chemotherapy or radiation therapy should be avoided. Routine use of PPSV23 vaccine is not recommended for American Indians, [place] Natives, or people younger than 65 years unless there are medical conditions that require PPSV23 vaccine. When indicated, people who have unknown immunization and have no record of immunization should receive PPSV23 vaccine. One-time revaccination 5 years after the first dose of PPSV23 is recommended for people aged 19-64 years who have chronic kidney failure, nephrotic syndrome, asplenia, or immunocompromised conditions. People who received 1-2 doses  of PPSV23 before age  80 years should receive another dose of PPSV23 vaccine at age 92 years or later if at least 5 years have passed since the previous dose. Doses of PPSV23 are not needed for people immunized with PPSV23 at or after age 2 years.    Hepatitis A vaccine. Adults who wish to be protected from this disease, have certain high-risk conditions, work with hepatitis A-infected animals, work in hepatitis A research labs, or travel to or work in countries with a high rate of hepatitis A should be immunized. Adults who were previously unvaccinated and who anticipate close contact with an international adoptee during the first 60 days after arrival in the [place] from a country with a high rate of hepatitis A should be immunized.    Hepatitis B vaccine. Adults should be immunized if they wish to be protected from this disease, have certain high-risk conditions, may be exposed to blood or other infectious body fluids, are household contacts or sex partners of hepatitis B positive people, are clients or workers in certain care facilities, or travel to or work in countries with a high rate of hepatitis B.   Preventive Service / Frequency   Ages 51 and over  Blood pressure check.  Lipid and cholesterol check.  Lung cancer screening. / Every year if you are aged 15-80 years and have a 30-pack-year history of smoking and currently smoke or have quit within the past 15 years. Yearly screening is stopped once you have quit smoking for at least 15 years or develop a health problem that would prevent you from having lung cancer treatment.  Fecal occult blood test (FOBT) of stool. You may not have to do this test if you get a colonoscopy every 10 years.  Flexible sigmoidoscopy** or colonoscopy.** / Every 5 years for a flexible sigmoidoscopy or every 10 years for a colonoscopy beginning at age 54 and continuing until age 37.  Hepatitis C blood test.** / For all people born from [birth year] through 1963 and any  individual with known risks for hepatitis C.  Abdominal aortic aneurysm (AAA) screening./ Screening current or former smokers or have Hypertension.  Skin self-exam. / Monthly.  Influenza vaccine. / Every year.  Tetanus, diphtheria, and acellular pertussis (Tdap/Td) vaccine.** / 1 dose of Td every 10 years.   Zoster vaccine.** / 1 dose for adults aged 23 years or older.         Pneumococcal 13-valent conjugate (PCV13) vaccine.    Pneumococcal polysaccharide (PPSV23) vaccine.     Hepatitis A vaccine.** / Consult your health care provider.  Hepatitis B vaccine.** / Consult your health care provider. Screening for abdominal aortic aneurysm (AAA)  by ultrasound is recommended for people who have history of high blood pressure or who are current or former smokers. ++++++++++ Recommend Adult Low Dose Aspirin or  coated  Aspirin 81 mg daily  To reduce risk of Colon Cancer 40 %,  Skin Cancer 26 % ,  Malignant Melanoma 46%  and  Pancreatic cancer 60% ++++++++++++++++++++++ Vitamin D goal  is between 70-100.  Please make sure that you are taking your Vitamin D as directed.  It is very important as a natural anti-inflammatory  helping hair, skin, and nails, as well as reducing stroke and heart attack risk.  It helps your bones and helps with mood. It also decreases numerous cancer risks so please take it as directed.  Low Vit D is associated with a 200-300% higher risk for CANCER  and 200-300% higher risk for HEART   ATTACK  &  STROKE.   .....................................Marland Kitchen It is also associated with higher death rate at younger ages,  autoimmune diseases like Rheumatoid arthritis, Lupus, Multiple Sclerosis.    Also many other serious conditions, like depression, Alzheimer's Dementia, infertility, muscle aches, fatigue, fibromyalgia - just to name a few. ++++++++++++++++++++++ Recommend the book "The END of DIETING" by Dr Excell Seltzer  & the book "The END of DIABETES " by Dr  Excell Seltzer At [email or web address] - get book & Audio CD's    Being diabetic has a  300% increased risk for heart attack, stroke, cancer, and alzheimer- type vascular dementia. It is very important that you work harder with diet by avoiding all foods that are white. Avoid white rice (brown & wild rice is OK), white potatoes (sweetpotatoes in moderation is OK), White bread or wheat bread or anything made out of white flour like bagels, donuts, rolls, buns, biscuits, cakes, pastries, cookies, pizza crust, and pasta (made from white flour & egg whites) - vegetarian pasta or spinach or wheat pasta is OK. Multigrain breads like Arnold's or Pepperidge Farm, or multigrain sandwich thins or flatbreads.  Diet, exercise and weight loss can reverse and cure diabetes in the early stages.  Diet, exercise and weight loss is very important in the control and prevention of complications of diabetes which affects every system in your body, ie. Brain - dementia/stroke, eyes - glaucoma/blindness, heart - heart attack/heart failure, kidneys - dialysis, stomach - gastric paralysis, intestines - malabsorption, nerves - severe painful neuritis, circulation - gangrene & loss of a leg(s), and finally cancer and Alzheimers.    I recommend avoid fried & greasy foods,  sweets/candy, white rice (brown or wild rice or Quinoa is OK), white potatoes (sweet potatoes are OK) - anything made from white flour - bagels, doughnuts, rolls, buns, biscuits,white and wheat breads, pizza crust and traditional pasta made of white flour & egg white(vegetarian pasta or spinach or wheat pasta is OK).  Multi-grain bread is OK - like multi-grain flat bread or sandwich thins. Avoid alcohol in excess. Exercise is also important.    Eat all the vegetables you want - avoid meat, especially red meat and dairy - especially cheese.  Cheese is the most concentrated form of trans-fats which is the worst thing to clog up our arteries. Veggie cheese is OK which can be found in  the fresh produce section at [employer] or [employer] or [employer]  ++++++++++++++++++++++ DASH Eating Plan  DASH stands for "Dietary Approaches to Stop Hypertension."   The DASH eating plan is a healthy eating plan that has been shown to reduce high blood pressure (hypertension). Additional health benefits may include reducing the risk of type 2 diabetes mellitus, heart disease, and stroke. The DASH eating plan may also help with weight loss. WHAT DO I NEED TO KNOW ABOUT THE DASH EATING PLAN? For the DASH eating plan, you will follow these general guidelines:  Choose foods with a percent daily value for sodium of less than 5% (as listed on the food label).  Use salt-free seasonings or herbs instead of table salt or sea salt.  Check with your health care provider or pharmacist before using salt substitutes.  Eat lower-sodium products, often labeled as "lower sodium" or "no salt added."  Eat fresh foods.  Eat more vegetables, fruits, and low-fat dairy products.  Choose whole grains. Look for the word "whole" as the first word in the ingredient list.  Choose fish   Limit sweets, desserts, sugars, and sugary drinks.  Choose heart-healthy fats.  Eat veggie cheese   Eat more home-cooked food and less restaurant, buffet, and fast food.  Limit fried foods.  Cook foods using methods other than frying.  Limit canned vegetables. If you do use them, rinse them well to decrease the sodium.  When eating at a restaurant, ask that your food be prepared with less salt, or no salt if possible.                      WHAT FOODS CAN I EAT? Read Dr Fara Olden Fuhrman's books on The End of Dieting & The End of Diabetes  Grains Whole grain or whole wheat bread. Brown rice. Whole grain or whole wheat pasta. Quinoa, bulgur, and whole grain cereals. Low-sodium cereals. Corn or whole wheat flour tortillas. Whole grain cornbread. Whole grain crackers. Low-sodium crackers.  Vegetables Fresh or  frozen vegetables (raw, steamed, roasted, or grilled). Low-sodium or reduced-sodium tomato and vegetable juices. Low-sodium or reduced-sodium tomato sauce and paste. Low-sodium or reduced-sodium canned vegetables.   Fruits All fresh, canned (in natural juice), or frozen fruits.  Protein Products  All fish and seafood.  Dried beans, peas, or lentils. Unsalted nuts and seeds. Unsalted canned beans.  Dairy Low-fat dairy products, such as skim or 1% milk, 2% or reduced-fat cheeses, low-fat ricotta or cottage cheese, or plain low-fat yogurt. Low-sodium or reduced-sodium cheeses.  Fats and Oils Tub margarines without trans fats. Light or reduced-fat mayonnaise and salad dressings (reduced sodium). Avocado. Safflower, olive, or canola oils. Natural peanut or almond butter.  Other Unsalted popcorn and pretzels. The items listed above may not be a complete list of recommended foods or beverages. Contact your dietitian for more options.  ++++++++++++++++++++  WHAT FOODS ARE NOT RECOMMENDED? Grains/ White flour or wheat flour White bread. White pasta. White rice. Refined cornbread. Bagels and croissants. Crackers that contain trans fat.  Vegetables  Creamed or fried vegetables. Vegetables in a . Regular canned vegetables. Regular canned tomato sauce and paste. Regular tomato and vegetable juices.  Fruits Dried fruits. Canned fruit in light or heavy syrup. Fruit juice.  Meat and Other Protein Products Meat in general - RED meat & White meat.  Fatty cuts of meat. Ribs, chicken wings, all processed meats as bacon, sausage, bologna, salami, fatback, hot dogs, bratwurst and packaged luncheon meats.  Dairy Whole or 2% milk, cream, half-and-half, and cream cheese. Whole-fat or sweetened yogurt. Full-fat cheeses or blue cheese. Non-dairy creamers and whipped toppings. Processed cheese, cheese spreads, or cheese curds.  Condiments Onion and garlic salt, seasoned salt, table salt, and sea salt.  Canned and packaged gravies. Worcestershire sauce. Tartar sauce. Barbecue sauce. Teriyaki sauce. Soy sauce, including reduced sodium. Steak sauce. Fish sauce. Oyster sauce. Cocktail sauce. Horseradish. Ketchup and mustard. Meat flavorings and tenderizers. Bouillon cubes. Hot sauce. Tabasco sauce. Marinades. Taco seasonings. Relishes.  Fats and Oils Butter, stick margarine, lard, shortening and bacon fat. Coconut, palm kernel, or palm oils. Regular salad dressings.  Pickles and olives. Salted popcorn and pretzels.  The items listed above may not be a complete list of foods and beverages to avoid.

## 2018-10-27 ENCOUNTER — Other Ambulatory Visit: Payer: Self-pay | Admitting: Internal Medicine

## 2018-10-27 DIAGNOSIS — I770 Arteriovenous fistula, acquired: Secondary | ICD-10-CM | POA: Diagnosis not present

## 2018-10-27 LAB — CBC WITH DIFFERENTIAL/PLATELET
Absolute Monocytes: 559 cells/uL (ref 200–950)
Basophils Absolute: 42 cells/uL (ref 0–200)
Basophils Relative: 0.9 %
Eosinophils Absolute: 132 cells/uL (ref 15–500)
Eosinophils Relative: 2.8 %
HCT: 39.8 % (ref 38.5–50.0)
Hemoglobin: 13.5 g/dL (ref 13.2–17.1)
Lymphs Abs: 902 cells/uL (ref 850–3900)
MCH: 32 pg (ref 27.0–33.0)
MCHC: 33.9 g/dL (ref 32.0–36.0)
MCV: 94.3 fL (ref 80.0–100.0)
MPV: 9.8 fL (ref 7.5–12.5)
Monocytes Relative: 11.9 %
Neutro Abs: 3064 cells/uL (ref 1500–7800)
Neutrophils Relative %: 65.2 %
Platelets: 202 10*3/uL (ref 140–400)
RBC: 4.22 10*6/uL (ref 4.20–5.80)
RDW: 12.6 % (ref 11.0–15.0)
Total Lymphocyte: 19.2 %
WBC: 4.7 10*3/uL (ref 3.8–10.8)

## 2018-10-27 LAB — URINALYSIS, ROUTINE W REFLEX MICROSCOPIC
Bilirubin Urine: NEGATIVE
Glucose, UA: NEGATIVE
Hgb urine dipstick: NEGATIVE
Ketones, ur: NEGATIVE
Leukocytes,Ua: NEGATIVE
Nitrite: NEGATIVE
Protein, ur: NEGATIVE
Specific Gravity, Urine: 1.007 (ref 1.001–1.03)
pH: 5.5 (ref 5.0–8.0)

## 2018-10-27 LAB — COMPLETE METABOLIC PANEL WITH GFR
AG Ratio: 1.9 (calc) (ref 1.0–2.5)
ALT: 20 U/L (ref 9–46)
AST: 23 U/L (ref 10–35)
Albumin: 4.4 g/dL (ref 3.6–5.1)
Alkaline phosphatase (APISO): 70 U/L (ref 35–144)
BUN/Creatinine Ratio: 17 (calc) (ref 6–22)
BUN: 22 mg/dL (ref 7–25)
CO2: 27 mmol/L (ref 20–32)
Calcium: 9.1 mg/dL (ref 8.6–10.3)
Chloride: 106 mmol/L (ref 98–110)
Creat: 1.29 mg/dL — ABNORMAL HIGH (ref 0.70–1.11)
GFR, Est African American: 57 mL/min/{1.73_m2} — ABNORMAL LOW (ref >=60)
GFR, Est Non African American: 49 mL/min/{1.73_m2} — ABNORMAL LOW (ref >=60)
Globulin: 2.3 g/dL (calc) (ref 1.9–3.7)
Glucose, Bld: 90 mg/dL (ref 65–99)
Potassium: 4.7 mmol/L (ref 3.5–5.3)
Sodium: 140 mmol/L (ref 135–146)
Total Bilirubin: 0.7 mg/dL (ref 0.2–1.2)
Total Protein: 6.7 g/dL (ref 6.1–8.1)

## 2018-10-27 LAB — TSH: TSH: 1.86 mIU/L (ref 0.40–4.50)

## 2018-10-27 LAB — MICROALBUMIN / CREATININE URINE RATIO
Creatinine, Urine: 33 mg/dL (ref 20–320)
Microalb Creat Ratio: 39 mcg/mg creat — ABNORMAL HIGH (ref <30)
Microalb, Ur: 1.3 mg/dL

## 2018-10-27 LAB — LIPID PANEL
Cholesterol: 203 mg/dL — ABNORMAL HIGH (ref <200)
HDL: 75 mg/dL (ref >=40)
LDL Cholesterol (Calc): 102 mg/dL (calc) — ABNORMAL HIGH
Non-HDL Cholesterol (Calc): 128 mg/dL (calc) (ref <130)
Total CHOL/HDL Ratio: 2.7 (calc) (ref <5.0)
Triglycerides: 154 mg/dL — ABNORMAL HIGH (ref <150)

## 2018-10-27 LAB — HEMOGLOBIN A1C
Hgb A1c MFr Bld: 4.9 % of total Hgb (ref <5.7)
Mean Plasma Glucose: 94 (calc)
eAG (mmol/L): 5.2 (calc)

## 2018-10-27 LAB — PSA: PSA: 3 ng/mL (ref <=4.0)

## 2018-10-27 LAB — VITAMIN D 25 HYDROXY (VIT D DEFICIENCY, FRACTURES): Vit D, 25-Hydroxy: 48 ng/mL (ref 30–100)

## 2018-10-27 LAB — INSULIN, RANDOM: Insulin: 4 u[IU]/mL

## 2018-10-27 LAB — MAGNESIUM: Magnesium: 2.1 mg/dL (ref 1.5–2.5)

## 2018-10-28 DIAGNOSIS — Q2739 Arteriovenous malformation, other site: Secondary | ICD-10-CM | POA: Diagnosis not present

## 2018-10-31 DIAGNOSIS — Z20828 Contact with and (suspected) exposure to other viral communicable diseases: Secondary | ICD-10-CM | POA: Diagnosis not present

## 2018-10-31 DIAGNOSIS — Z01818 Encounter for other preprocedural examination: Secondary | ICD-10-CM | POA: Diagnosis not present

## 2018-10-31 DIAGNOSIS — I671 Cerebral aneurysm, nonruptured: Secondary | ICD-10-CM | POA: Diagnosis not present

## 2018-10-31 DIAGNOSIS — Q2730 Arteriovenous malformation, site unspecified: Secondary | ICD-10-CM | POA: Insufficient documentation

## 2018-11-01 DIAGNOSIS — Z91048 Other nonmedicinal substance allergy status: Secondary | ICD-10-CM | POA: Diagnosis not present

## 2018-11-01 DIAGNOSIS — M48061 Spinal stenosis, lumbar region without neurogenic claudication: Secondary | ICD-10-CM | POA: Diagnosis not present

## 2018-11-01 DIAGNOSIS — Z888 Allergy status to other drugs, medicaments and biological substances status: Secondary | ICD-10-CM | POA: Diagnosis not present

## 2018-11-01 DIAGNOSIS — Z79899 Other long term (current) drug therapy: Secondary | ICD-10-CM | POA: Diagnosis not present

## 2018-11-01 DIAGNOSIS — I770 Arteriovenous fistula, acquired: Secondary | ICD-10-CM | POA: Diagnosis not present

## 2018-11-01 DIAGNOSIS — I4891 Unspecified atrial fibrillation: Secondary | ICD-10-CM | POA: Diagnosis not present

## 2018-11-01 DIAGNOSIS — K582 Mixed irritable bowel syndrome: Secondary | ICD-10-CM | POA: Diagnosis not present

## 2018-11-01 DIAGNOSIS — Z8546 Personal history of malignant neoplasm of prostate: Secondary | ICD-10-CM | POA: Diagnosis not present

## 2018-11-01 DIAGNOSIS — I10 Essential (primary) hypertension: Secondary | ICD-10-CM | POA: Diagnosis not present

## 2018-11-01 DIAGNOSIS — Z9660 Presence of unspecified orthopedic joint implant: Secondary | ICD-10-CM | POA: Diagnosis not present

## 2018-11-01 DIAGNOSIS — Q7649 Other congenital malformations of spine, not associated with scoliosis: Secondary | ICD-10-CM | POA: Diagnosis not present

## 2018-11-02 DIAGNOSIS — I10 Essential (primary) hypertension: Secondary | ICD-10-CM | POA: Diagnosis not present

## 2018-11-02 DIAGNOSIS — I770 Arteriovenous fistula, acquired: Secondary | ICD-10-CM | POA: Diagnosis not present

## 2018-11-02 DIAGNOSIS — M48061 Spinal stenosis, lumbar region without neurogenic claudication: Secondary | ICD-10-CM | POA: Diagnosis not present

## 2018-11-02 DIAGNOSIS — I4891 Unspecified atrial fibrillation: Secondary | ICD-10-CM | POA: Diagnosis not present

## 2018-11-02 DIAGNOSIS — Q7649 Other congenital malformations of spine, not associated with scoliosis: Secondary | ICD-10-CM | POA: Diagnosis not present

## 2018-11-02 DIAGNOSIS — K582 Mixed irritable bowel syndrome: Secondary | ICD-10-CM | POA: Diagnosis not present

## 2018-11-02 HISTORY — PX: OTHER SURGICAL HISTORY: SHX169

## 2018-11-03 ENCOUNTER — Other Ambulatory Visit: Payer: Self-pay | Admitting: Internal Medicine

## 2018-11-03 DIAGNOSIS — I4891 Unspecified atrial fibrillation: Secondary | ICD-10-CM

## 2018-11-03 MED ORDER — DILTIAZEM HCL ER COATED BEADS 120 MG PO CP24: ORAL_CAPSULE | ORAL | 2 refills | Status: DC

## 2018-11-07 ENCOUNTER — Encounter: Payer: Self-pay | Admitting: Cardiology

## 2018-11-07 ENCOUNTER — Ambulatory Visit (INDEPENDENT_AMBULATORY_CARE_PROVIDER_SITE_OTHER): Payer: Medicare Other | Admitting: Cardiology

## 2018-11-07 ENCOUNTER — Other Ambulatory Visit: Payer: Self-pay

## 2018-11-07 VITALS — BP 144/82 | HR 95 | Temp 97.0°F | Ht 76.0 in | Wt 202.6 lb

## 2018-11-07 DIAGNOSIS — R001 Bradycardia, unspecified: Secondary | ICD-10-CM

## 2018-11-07 DIAGNOSIS — I4819 Other persistent atrial fibrillation: Secondary | ICD-10-CM | POA: Diagnosis not present

## 2018-11-07 DIAGNOSIS — R0989 Other specified symptoms and signs involving the circulatory and respiratory systems: Secondary | ICD-10-CM | POA: Diagnosis not present

## 2018-11-07 DIAGNOSIS — I4891 Unspecified atrial fibrillation: Secondary | ICD-10-CM

## 2018-11-07 MED ORDER — DILTIAZEM HCL ER COATED BEADS 120 MG PO CP24: ORAL_CAPSULE | ORAL | 4 refills | Status: DC

## 2018-11-07 MED ORDER — BISOPROLOL FUMARATE 5 MG PO TABS: ORAL_TABLET | ORAL | 1 refills | Status: DC

## 2018-11-07 NOTE — Progress Notes (Signed)
PCP: Eugene Pinto, MD  Clinic Note: Chief Complaint  Patient presents with  . New Patient (Initial Visit)    New diagnosis of A. fib    HPI: Eugene Friedman is a 85 y.o. male who is being seen today for the evaluation of New Dx Afib at the request of Eugene Pinto, MD.  Eugene Friedman was actually seen in May 2017 by Dr. Percival Spanish for evaluation of L Shoulder Pain --> he noted that the pt has had normal ST evaluations in the past. Pt routinely walks (including inclines) & denies any exacerbation of Sx. -- Felt to be athypical, non-anginal pain. --> GXT ordered. -- GXT - walked 7 min.  Occ PAC &PVCs - No ischemic EKG changes (NORMAL)  Recent Dx - Spinal Dural AVM w/ fistula - was noting extreme leg fatigue.(normally active - walks 20-30 min/day) -->referred for AVM embolization 9/16 - d/c from [facility] - on Xarelto, Zebeta & Diltiazem -> should be due to follow-up from this procedure and then the neck step would be determine what to do about the spinal stenosis.  Would like to be at least 3 months before any procedure done.  Recent Hospitalizations: Only for his procedure on 916  Studies Personally Reviewed - (if available, images/films reviewed: From Epic Chart or Care Everywhere)  GXT reviewed above  Translation by his Daughter - [place].  He understands [place], but has certain difficulties expressing himself in [place].  Some words are also hard for him to understand.  It also appears that he is somewhat reluctant to provide information.  Interval History: Eugene Friedman is a very pleasant gentleman who is somewhat confused about the nature of the last several clinical visits that he has been having.  Most of his issues revolved around his spinal stenosis with AVM.  A lot of his physical symptoms with troubles with walking have resolved since the did the initial procedure.  He was told that he has atrial fibrillation there was diagnosed prior to going to do.  He was started on diltiazem  at 240 mg and that was reduced to 120 mg while at [facility].  He was also on bisoprolol but he was not sure what dose to take.  (Discharge instructions are documented full tablet).. He has been on Xarelto since September 14.  Has not no bleeding issues.  As for the A. fib, it he says that maybe if he is active he feels his heart getting up faster than it used to.  Resting heart rate seems a little bit fast but he does not really notice the palpitations.  He has noted some exercise intolerance and some exertional dyspnea, but was talking that up to the fact that he has not been on a walk with his back.  No chest pain or pressure with rest or exertion. No syncope/near syncope or TIA since amaurosis fugax.  No melena, hematochezia, hematuria, or epstaxis.  No easy bruising Not walking up and down the no claudication.  ROS: A comprehensive was performed. Review of Systems  Constitutional: Negative for malaise/fatigue (Not really true body with fatigue more mostly that his legs have been fatigued).  Respiratory: Negative for cough and shortness of breath.   Gastrointestinal: Negative for constipation, heartburn, nausea and vomiting.  Musculoskeletal: Positive for back pain. Negative for falls.  Neurological: Positive for focal weakness (Legs are weak). Negative for dizziness (Not so much dizziness.  More of just poor balance).  Psychiatric/Behavioral: Negative.   All other systems reviewed and  are negative.   I have reviewed and (if needed) personally updated the patient's problem list, medications, allergies, past medical and surgical history, social and family history.   Past Medical History:  Diagnosis Date  . Allergic rhinitis   . Atrial fibrillation by electrocardiogram ([facility]) 10/2018   Was started on calcium channel blocker, beta-blocker and Xarelto  . Cancer [facility])    prostate  . DJD (degenerative joint disease)   . Hypertension    Borderline   . Sinus bradycardia   . Spinal stenosis     Complicated by dural AVM  . Vitamin D deficiency     Past Surgical History:  Procedure Laterality Date  . APPENDECTOMY    . Back Procedure  11/02/2018   [facility] -vascular neuroradiology occlusion of dural AVM  . cervical neck mass   07/08/06   Left Excision left posterior cervical neck mass (2.5 cm). SURGEON:  Earnstine Regal, M.D., Allegra Grana 07/08/06 and 12/05/07  . FLEXIBLE SIGMOIDOSCOPY    . HEMORRHOID SURGERY    . JOINT REPLACEMENT     Left knee 2013  . KNEE SURGERY    . MASTOIDECTOMY Left     Current Meds  Medication Sig  . aspirin EC 81 MG tablet Take 81 mg by mouth daily.  . bisoprolol (ZEBETA) 5 MG tablet Take 1 tablet daily for BP and Pulse Rate  . Cholecalciferol (VITAMIN D-3) 5000 UNITS TABS Take by mouth.  . dicyclomine (BENTYL) 20 MG tablet Take 1 tablet 3 x /day before Meals for Nausea, Bloating , Cramping or Diarrhea  . diltiazem (CARDIZEM CD) 120 MG 24 hr capsule Take 1 capsule Daily for BP & Heart Rhythm  . doxazosin (CARDURA) 8 MG tablet Take 1/2 to 1 tablet at Bedtime for Prostate & BP  . finasteride (PROSCAR) 5 MG tablet Take 1 tablet (5 mg total) by mouth daily.  . hyoscyamine (LEVSIN SL) 0.125 MG SL tablet DISSOLVE 1 TO 2 TABLETS UNDER THE TONGUE FOUR TIMES DAILY AS NEEDED FOR ABDOMINAL CRAMPS OR NAUSEA OR DIARRHEA  . LUTEIN PO Take 5 mg by mouth 2 (two) times daily.  Marland Kitchen neomycin-polymyxin-hydrocortisone (CORTISPORIN) 3.5-10000-1 OTIC suspension Place 4 drops into both ears 4 (four) times daily. (Patient taking differently: Place 4 drops into both ears 4 (four) times daily as needed. )  . [DISCONTINUED] bisoprolol (ZEBETA) 5 MG tablet Take 1/2 to 1 tablet daily for BP and Pulse Rate (Patient taking differently: as needed. Take 1/2 to 1 tablet daily for BP and Pulse Rate)  . [DISCONTINUED] diltiazem (CARDIZEM CD) 120 MG 24 hr capsule Take 1 capsule Daily for BP & Heart Rhythm  . [DISCONTINUED] rivaroxaban (XARELTO) 20 MG TABS tablet Take 1 tablet daily to Prevent blood Clots  And Strokes    Allergies  Allergen Reactions  . Celebrex [Celecoxib] Other (See Comments)    GI upset  . Lipitor [Atorvastatin]   . Ppd [Tuberculin Purified Protein Derivative] Other (See Comments)    Positive PPD  . Adhesive [Tape] Rash    Zinc in the band-aid will cause a rash  . Nickel Rash    Social History   Tobacco Use  . Smoking status: Never Smoker  . Smokeless tobacco: Never Used  Substance Use Topics  . Alcohol use: Yes    Alcohol/week: 10.0 standard drinks    Types: 10 Glasses of wine per week  . Drug use: No   Social History   Social History Narrative   Left handed   Caffeine 1.5 cups  daily    Lives at home alone Wife passed.    Accompanied by his daughter.   Native [place] -> speaks [place] well  Mr. Dunkerley lives in [place], [place]. He is retired. He does not smoke cigarettes or use illicit drugs. He drinks several glasses of wine weekly. He reports that he has never smoked. He has never used smokeless tobacco. He reports current alcohol use of about 3.0 standard drinks of alcohol per week. He reports that he does not use drugs.     family history includes Cancer in his maternal grandmother; Diabetes in his maternal aunt, mother, and paternal aunt; Heart attack (age of onset: 5) in his father; Hypertension in his father; Stroke in his mother.  Wt Readings from Last 3 Encounters:  11/07/18 202 lb 9.6 oz (91.9 kg)  10/26/18 202 lb 3.2 oz (91.7 kg)  10/04/18 200 lb (90.7 kg)    PHYSICAL EXAM BP (!) 144/82   Pulse 95   Temp (!) 97 F (36.1 C)   Ht 6\' 4"  (1.93 m)   Wt 202 lb 9.6 oz (91.9 kg)   SpO2 97%   BMI 24.66 kg/m  Physical Exam  Constitutional: He is oriented to person, place, and time. He appears well-developed and well-nourished. No distress.  Healthy-appearing, pleasant elderly gentleman.  Well-groomed  HENT:  Head: Normocephalic and atraumatic.  Eyes: Pupils are equal, round, and reactive to light. Conjunctivae and EOM are normal.   Neck: Normal range of motion. Neck supple. No hepatojugular reflux and no JVD present. Carotid bruit is not present.  Cardiovascular: Normal rate and normal pulses. An irregularly irregular rhythm present. PMI is not displaced. Exam reveals no gallop and no friction rub.  Murmur (Soft 1-2/6 SEM at RUSB) heard. Borderline tachycardic upon first evaluation, but was not as fast after talking for a while  Pulmonary/Chest: Effort normal and breath sounds normal. No respiratory distress. He has no wheezes. He has no rales.  Abdominal: Soft. Bowel sounds are normal. He exhibits no distension. There is no abdominal tenderness. There is no rebound.  No HSM  Musculoskeletal: Normal range of motion.        General: No edema.  Neurological: He is alert and oriented to person, place, and time. No cranial nerve deficit (Grossly intact).  Skin: Skin is warm and dry.  Psychiatric: He has a normal mood and affect. His behavior is normal. Judgment and thought content normal.  Vitals reviewed.    Adult ECG Report  Rate: 95 ;  Rhythm: atrial fibrillation and Otherwise normal axis, intervals and durations;   Narrative Interpretation: Abnormal because of A. fib   Other studies Reviewed: Additional studies/ records that were reviewed today include:  Recent Labs: Labs from PCP ([facility]) -  10/26/2018: TC 203, TG 154, HDL 75, LDL 102.  A1c 4.9.  Hgb 13.5. Cr 1.29.  K+ 4.7.  TSH 1.6.   ASSESSMENT / PLAN: Problem List Items Addressed This Visit    Persistent atrial fibrillation: CHA2DS2Vasc = 3 (Age & HTN) - Primary (Chronic)    Appears to have been in A. fib since September 14.  Has been on Xarelto since that day and has not missed a dose.  This patients CHA2DS2-VASc Score and unadjusted Ischemic Stroke Rate (% per year) is equal to 3.2 % stroke rate/year from a score of 3  Above score calculated as 1 point each if present [CHF, HTN, DM, Vascular=MI/PAD/Aortic Plaque, Age if 65-74, or Male] Above score  calculated as 2 points each  if present [Age > 75, or Stroke/TIA/TE]   I would like to see if he truly has persistent A. fib or if this is paroxysmal and we are simply catching to A. fib episodes.  The lack of symptoms means it is difficult to tell clinically.  Would also like to give a trial of restoring sinus rhythm.  Would also like to exclude structural normality with a soft murmur on exam.  Plan: Continue Xarelto (would not need to hold until following back procedure at least 3 months out)  Check 2-week event monitor to determine A. fib burden (and to see if he is persistent versus paroxysmal)  Continue combination of bisoprolol 5 mg in p.m. and diltiazem XT 120 mg in a.m. for rate control.  As he is relatively asymptomatic, I think we can avoid antiarrhythmics for now.  Check 2D echocardiogram to exclude structural normality or reduced EF and soft murmur  We will plan cardioversion 3 weeks from now which would be an entire month since initiating Xarelto with diagnosis.  If able to convert, would like to see how long he stays out of it.  At that time we could discuss pros and cons of antiarrhythmic if necessary.  He would need to stay on uninterrupted Xarelto for 1 month after cardioversion which should work since he is now due to have another procedure done on his back for another 3 months.      Relevant Medications   diltiazem (CARDIZEM CD) 120 MG 24 hr capsule   bisoprolol (ZEBETA) 5 MG tablet   Other Relevant Orders   EKG 12-Lead (Completed)   ECHOCARDIOGRAM COMPLETE   CARDIAC EVENT MONITOR   Basic metabolic panel   CBC   Labile hypertension (Chronic)    Had previously been on Tiazac which was bisoprolol-HCTZ along with losartan.  Now that he has been put on diltiazem, losartan was stopped. Blood pressure is borderline but probably okay he has been only taking 2.5 mg bisoprolol.    Plan: Go back to taking 5 mg bisoprolol at bedtime and then take the diltiazem 120 mg in the  morning.      Relevant Medications   diltiazem (CARDIZEM CD) 120 MG 24 hr capsule   bisoprolol (ZEBETA) 5 MG tablet   Other Relevant Orders   Basic metabolic panel   CBC   Sinus bradycardia    He has had a history of sinus bradycardia, pose a problem following cardioversion.  Need to monitor with him being on combination of bisoprolol and diltiazem.  Question if there is some tachybradycardia.      Relevant Medications   diltiazem (CARDIZEM CD) 120 MG 24 hr capsule   bisoprolol (ZEBETA) 5 MG tablet    Other Visit Diagnoses    Atrial fibrillation, unspecified type (HCC)       Relevant Medications   diltiazem (CARDIZEM CD) 120 MG 24 hr capsule   bisoprolol (ZEBETA) 5 MG tablet   Other Relevant Orders   EKG 12-Lead (Completed)   ECHOCARDIOGRAM COMPLETE   CARDIAC EVENT MONITOR   Basic metabolic panel   CBC      I spent a total of 35 minutes with the patient and chart review. >  50% of the time was spent in direct patient consultation.   Current medicines are reviewed at length with the patient today.  (+/- concerns) just wants clarification of dosing   Patient Instructions  Medication Instructions:   -start taking 5 mg  Bisoprolol at bedtime   -- start  taking Diltiazem in the morning  If you need a refill on your cardiac medications before your next appointment, please call your pharmacy.   Lab work: Will need labs  cardioversion Cbc Bmp  If you have labs (blood work) drawn today and your tests are completely normal, you will receive your results only by: Marland Kitchen MyChart Message (if you have MyChart) OR . A paper copy in the mail If you have any lab test that is abnormal or we need to change your treatment, we will call you to review the results.  Testing/Procedures: Will be schedule the week of Oct 5 ,2020 at [facility] has recommended that you have a Cardioversion (DCCV). Electrical Cardioversion uses a jolt of electricity to your heart  either through paddles or wired patches attached to your chest. This is a controlled, usually prescheduled, procedure. Defibrillation is done under light anesthesia in the hospital, and you usually go home the day of the procedure. This is done to get your heart back into a normal rhythm. You are not awake for the procedure. Please see the instruction sheet given to you today.\  [place] has recommended that you wear an event monitor. Event monitors are medical devices that record the heart's electrical activity. Doctors most often [place] these monitors to diagnose arrhythmias. Arrhythmias are problems with the speed or rhythm of the heartbeat. The monitor is a small, portable device. You can wear one while you do your normal daily activities. This is usually used to diagnose what is causing palpitations/syncope (passing out). And  Your physician has requested that you have an echocardiogram. Echocardiography is a painless test that uses sound waves to create images of your heart. It provides your doctor with information about the size and shape of your heart and how well your heart's chambers and valves are working. This procedure takes approximately one hour. There are no restrictions for this procedure.     Follow-Up: At [facility], you and your health needs are our priority.  As part of our continuing mission to provide you with exceptional heart care, we have created designated Provider Care Teams.  These Care Teams include your primary Cardiologist (physician) and Advanced Practice Providers (APPs -  Physician Assistants and Nurse Practitioners) who all work together to provide you with the care you need, when you need it. . You will need a follow up appointment in         2  months.  Please call our office 2 months in advance to schedule this appointment.  You may see Glenetta Hew, MD or one of the following Advanced Practice Providers on your designated Care  Team:   . Rosaria Ferries, PA-C . Jory Sims, DNP, ANP  Any Other Special Instructions Will Be Listed Below (If Applicable).    Studies Ordered:   Orders Placed This Encounter  Procedures  . Basic metabolic panel  . CBC  . CARDIAC EVENT MONITOR  . EKG 12-Lead  . ECHOCARDIOGRAM COMPLETE      Glenetta Hew, M.D., M.S. Interventional Cardiologist   Pager # [phone] Phone # [phone] [address] [place], [place] [address]   Thank you for choosing Heartcare at [facility]!!

## 2018-11-07 NOTE — Patient Instructions (Addendum)
Medication Instructions:   -start taking 5 mg  Bisoprolol at bedtime   -- start taking Diltiazem in the morning  If you need a refill on your cardiac medications before your next appointment, please call your pharmacy.   Lab work: Will need labs  cardioversion Cbc Bmp  If you have labs (blood work) drawn today and your tests are completely normal, you will receive your results only by: Marland Kitchen MyChart Message (if you have MyChart) OR . A paper copy in the mail If you have any lab test that is abnormal or we need to change your treatment, we will call you to review the results.  Testing/Procedures: Will be schedule the week of Oct 5 ,2020 at [place] has recommended that you have a Cardioversion (DCCV). Electrical Cardioversion uses a jolt of electricity to your heart either through paddles or wired patches attached to your chest. This is a controlled, usually prescheduled, procedure. Defibrillation is done under light anesthesia in the hospital, and you usually go home the day of the procedure. This is done to get your heart back into a normal rhythm. You are not awake for the procedure. Please see the instruction sheet given to you today.\  [facility] has recommended that you wear an event monitor. Event monitors are medical devices that record the heart's electrical activity. Doctors most often [place] these monitors to diagnose arrhythmias. Arrhythmias are problems with the speed or rhythm of the heartbeat. The monitor is a small, portable device. You can wear one while you do your normal daily activities. This is usually used to diagnose what is causing palpitations/syncope (passing out). And  Your physician has requested that you have an echocardiogram. Echocardiography is a painless test that uses sound waves to create images of your heart. It provides your doctor with information about the size and shape of your heart and how well your  heart's chambers and valves are working. This procedure takes approximately one hour. There are no restrictions for this procedure.     Follow-Up: At [facility] you and your health needs are our priority.  As part of our continuing mission to provide you with exceptional heart care, we have created designated Provider Care Teams.  These Care Teams include your primary Cardiologist (physician) and Advanced Practice Providers (APPs -  Physician Assistants and Nurse Practitioners) who all work together to provide you with the care you need, when you need it. . You will need a follow up appointment in         2  months.  Please call our office 2 months in advance to schedule this appointment.  You may see Glenetta Hew, MD or one of the following Advanced Practice Providers on your designated Care Team:   . Rosaria Ferries, PA-C . Jory Sims, DNP, ANP  Any Other Special Instructions Will Be Listed Below (If Applicable).

## 2018-11-08 ENCOUNTER — Telehealth: Payer: Self-pay | Admitting: *Deleted

## 2018-11-08 NOTE — Telephone Encounter (Signed)
Left message to call back   will need to schedule cardioversion the week of OCT 12,2020 -DUE TO PATIENT WILL NEED TO HAVE COVID TEST 3 DAYS PRIOR TO  CARDIOVERSION . PATIENT HAS APPOINTMENT THE WEEK OF OCT 5 .  APPOINTMENT ARE OCT 6 AND 7 WITH ANOTHER DOCTORS.  OPTION ARE OCT 13 OR OCT 14

## 2018-11-09 ENCOUNTER — Other Ambulatory Visit: Payer: Self-pay | Admitting: Internal Medicine

## 2018-11-09 DIAGNOSIS — I4891 Unspecified atrial fibrillation: Secondary | ICD-10-CM

## 2018-11-09 MED ORDER — RIVAROXABAN 20 MG PO TABS: ORAL_TABLET | ORAL | 3 refills | Status: DC

## 2018-11-10 ENCOUNTER — Telehealth: Payer: Self-pay | Admitting: *Deleted

## 2018-11-10 ENCOUNTER — Encounter: Payer: Self-pay | Admitting: Cardiology

## 2018-11-10 NOTE — Assessment & Plan Note (Addendum)
Appears to have been in A. fib since September 14.  Has been on Xarelto since that day and has not missed a dose.  This patients CHA2DS2-VASc Score and unadjusted Ischemic Stroke Rate (% per year) is equal to 3.2 % stroke rate/year from a score of 3  Above score calculated as 1 point each if present [CHF, HTN, DM, Vascular=MI/PAD/Aortic Plaque, Age if 65-74, or Male] Above score calculated as 2 points each if present [Age > 75, or Stroke/TIA/TE]   I would like to see if he truly has persistent A. fib or if this is paroxysmal and we are simply catching to A. fib episodes.  The lack of symptoms means it is difficult to tell clinically.  Would also like to give a trial of restoring sinus rhythm.  Would also like to exclude structural normality with a soft murmur on exam.  Plan: Continue Xarelto (would not need to hold until following back procedure at least 3 months out)  Check 2-week event monitor to determine A. fib burden (and to see if he is persistent versus paroxysmal)  Continue combination of bisoprolol 5 mg in p.m. and diltiazem XT 120 mg in a.m. for rate control.  As he is relatively asymptomatic, I think we can avoid antiarrhythmics for now.  Check 2D echocardiogram to exclude structural normality or reduced EF and soft murmur  We will plan cardioversion 3 weeks from now which would be an entire month since initiating Xarelto with diagnosis.  If able to convert, would like to see how long he stays out of it.  At that time we could discuss pros and cons of antiarrhythmic if necessary.  He would need to stay on uninterrupted Xarelto for 1 month after cardioversion which should work since he is now due to have another procedure done on his back for another 3 months.

## 2018-11-10 NOTE — Assessment & Plan Note (Signed)
He has had a history of sinus bradycardia, pose a problem following cardioversion.  Need to monitor with him being on combination of bisoprolol and diltiazem.  Question if there is some tachybradycardia.

## 2018-11-10 NOTE — Assessment & Plan Note (Signed)
Had previously been on Tiazac which was bisoprolol-HCTZ along with losartan.  Now that he has been put on diltiazem, losartan was stopped. Blood pressure is borderline but probably okay he has been only taking 2.5 mg bisoprolol.    Plan: Go back to taking 5 mg bisoprolol at bedtime and then take the diltiazem 120 mg in the morning.

## 2018-11-10 NOTE — Telephone Encounter (Signed)
[employer] to ship a 14 day cardiac event monitor to his home.  Instructions reviewed briefly as they are included in the monitor kit.

## 2018-11-11 ENCOUNTER — Other Ambulatory Visit: Payer: Self-pay

## 2018-11-11 ENCOUNTER — Ambulatory Visit (HOSPITAL_COMMUNITY): Payer: Medicare Other | Attending: Cardiology

## 2018-11-11 ENCOUNTER — Ambulatory Visit: Payer: Medicare Other | Admitting: Cardiology

## 2018-11-11 ENCOUNTER — Telehealth: Payer: Self-pay

## 2018-11-11 DIAGNOSIS — I4891 Unspecified atrial fibrillation: Secondary | ICD-10-CM | POA: Diagnosis not present

## 2018-11-11 DIAGNOSIS — I4819 Other persistent atrial fibrillation: Secondary | ICD-10-CM | POA: Insufficient documentation

## 2018-11-11 NOTE — Telephone Encounter (Signed)
-----   Message from Jenean Lindau, MD sent at 11/11/2018  1:13 PM EDT ----- The results of the study is unremarkable.  Preserved left ventricular systolic function but dilated atria. please inform patient. I will discuss in detail at next appointment. Cc  primary care/referring physician Jenean Lindau, MD 11/11/2018 1:13 PM

## 2018-11-11 NOTE — Telephone Encounter (Signed)
Patient informed of results, copy sent to Dr. Melford Aase per Dr. Docia Furl request.

## 2018-11-14 DIAGNOSIS — N400 Enlarged prostate without lower urinary tract symptoms: Secondary | ICD-10-CM | POA: Diagnosis not present

## 2018-11-14 DIAGNOSIS — C61 Malignant neoplasm of prostate: Secondary | ICD-10-CM | POA: Diagnosis not present

## 2018-11-14 NOTE — Telephone Encounter (Signed)
LEFT MESSAGE TO CALL BACK - TO DISCUSS  CARDIOVERSION  PROCEDURE SET UP -FOR THE WEEK OF OCT 12

## 2018-11-15 ENCOUNTER — Telehealth: Payer: Self-pay | Admitting: *Deleted

## 2018-11-15 NOTE — Telephone Encounter (Signed)
PSA results from 10/26/2018 faxed to Dr Lovena Neighbours at [facility].

## 2018-11-18 DIAGNOSIS — M25551 Pain in right hip: Secondary | ICD-10-CM | POA: Diagnosis not present

## 2018-11-20 ENCOUNTER — Ambulatory Visit (INDEPENDENT_AMBULATORY_CARE_PROVIDER_SITE_OTHER): Payer: Medicare Other

## 2018-11-20 DIAGNOSIS — I4819 Other persistent atrial fibrillation: Secondary | ICD-10-CM

## 2018-11-20 DIAGNOSIS — I4891 Unspecified atrial fibrillation: Secondary | ICD-10-CM

## 2018-11-22 ENCOUNTER — Other Ambulatory Visit: Payer: Self-pay

## 2018-11-22 ENCOUNTER — Ambulatory Visit (INDEPENDENT_AMBULATORY_CARE_PROVIDER_SITE_OTHER): Payer: Medicare Other | Admitting: Internal Medicine

## 2018-11-22 VITALS — BP 126/80 | HR 72 | Temp 97.1°F | Resp 16 | Ht 74.0 in | Wt 201.8 lb

## 2018-11-22 DIAGNOSIS — Z23 Encounter for immunization: Secondary | ICD-10-CM

## 2018-11-22 DIAGNOSIS — I4891 Unspecified atrial fibrillation: Secondary | ICD-10-CM | POA: Diagnosis not present

## 2018-11-22 DIAGNOSIS — I10 Essential (primary) hypertension: Secondary | ICD-10-CM | POA: Diagnosis not present

## 2018-11-22 NOTE — Progress Notes (Signed)
    Subjective:    Patient ID: Eugene Friedman, male    DOB: 06-01-1928, 85 y.o.   MRN: [id number]  HPI    Patient is a nice 85 yo WWM with HTN & HLD recently dx's on Sept 9 with Afib and was started on Xarelto. Then seen by Dr Ellyn Hack 9/21 who recommended continuing Xarelto for 3 months before CV if needed. Wearing a Heart Monitor today. Denies any c/o HA's , dizziness, CP, palpitations, dyspnea or dependent edema.  Medication Sig  . aspirin EC 81 MG Take  daily.  . bisoprolol 5 MG Take 1 tablet daily for BP and Pulse Rate  . VITAMIN D 5000 UNITS Take daily  . dicyclomine 20 MG Take 1 tablet 3 x /day before Meals for Nausea, Bloating , Cramping or Diarrhea  . diltiazem CD 120 MG 24 hr cap Take 1 capsule Daily for BP & Heart Rhythm  . doxazosin  8 MG  Take 1/2 to 1 tablet at Bedtime for Prostate & BP  . finasteride  5 MG  Take 1 tablet  daily.  Marland Kitchen Hyoscyamine SL 0.125 MG SL DISSOLVE 1 TO 2 TAB UNDER  TONGUE FOUR TIMES DAILY AS NEEDED   . LUTEIN PO Take 5 mg  2  times daily.  . CORTISPORIN  OTIC susp Place 4 drops into both ears 4  times daily.  Alveda Reasons 20 MG TABS Take 1 tablet daily to Prevent blood Clots And Strokes   Past Medical History:  Diagnosis Date  . Allergic rhinitis   . Atrial fibrillation by electrocardiogram ([place]) 10/2018   Was started on calcium channel blocker, beta-blocker and Xarelto  . Cancer [facility])    prostate  . DJD (degenerative joint disease)   . Hypertension    Borderline   . Sinus bradycardia   . Spinal stenosis    Complicated by dural AVM  . Vitamin D deficiency    Past Surgical History:  Procedure Laterality Date  . APPENDECTOMY    . Back Procedure  11/02/2018   [facility] -vascular neuroradiology occlusion of dural AVM  . cervical neck mass   07/08/06   Left Excision left posterior cervical neck mass (2.5 cm). SURGEON:  Earnstine Regal, M.D., Allegra Grana 07/08/06 and 12/05/07  . FLEXIBLE SIGMOIDOSCOPY    . HEMORRHOID SURGERY    . JOINT REPLACEMENT     Left knee  2013  . KNEE SURGERY    . MASTOIDECTOMY Left    Review of Systems   10 point systems review negative except as above.    Objective:   Physical Exam  BP 126/80   Pulse 72   Temp (!) 97.1 F (36.2 C)   Resp 16   Ht 6\' 2"  (1.88 m)   Wt 201 lb 12.8 oz (91.5 kg)   BMI 25.91 kg/m   HEENT - WNL. Neck - supple.  Chest - Clear equal BS. Cor - Nl HS. RRR w/o sig MGR. PP 1(+). No edema. MS- FROM w/o deformities.  Gait Nl. Neuro -  Nl w/o focal abnormalities.    Assessment & Plan:   1. Essential hypertension  2. Atrial fibrillation ([place])  3. Need for immunization against influenza  - Flu vaccine HIGH DOSE PF (Fluzone High dose)  - discussed meds & SE's & f/u w/Dr Ellyn Hack for his new Afib.

## 2018-11-23 DIAGNOSIS — Z23 Encounter for immunization: Secondary | ICD-10-CM | POA: Diagnosis not present

## 2018-11-23 DIAGNOSIS — I770 Arteriovenous fistula, acquired: Secondary | ICD-10-CM | POA: Diagnosis not present

## 2018-11-23 DIAGNOSIS — I4891 Unspecified atrial fibrillation: Secondary | ICD-10-CM | POA: Diagnosis not present

## 2018-11-26 ENCOUNTER — Encounter: Payer: Self-pay | Admitting: Internal Medicine

## 2018-11-29 ENCOUNTER — Telehealth: Payer: Self-pay

## 2018-11-29 NOTE — Telephone Encounter (Signed)
Received preventice strip reporting Atrial Flutter with Variable Conduction, pause (3.1) w/PVC's (1 in 1 min) around 2:30 this morning. Spoke to pt who report he remember going to the restroom but denies any symptoms.   Strip reviewed by Dr. Ellyn Hack who recommends to continue with plan.

## 2018-11-30 NOTE — Telephone Encounter (Signed)
SPOKE WITH PATIENT -  INFORM HIM OF CARDIOVERSION FOR OCT 20 , 2020- AND COVID NEED OCT 16 ,2020. PATIENT STATES HE MAY NEED TO CHECK WITH HIS SCHEDULE -  CONTACT WILL  BE MADE TOMORROW TO CONFIRM PROCEDURE TIME AND DATE.

## 2018-12-01 NOTE — Telephone Encounter (Signed)
Spoke to patient . Patient states he has an appointment with dermatologist on Friday Dec 02, 2018 and eye exam on Tuesday 0ct 20, 2020.    Cardioversion reschedule for  Oct 27,2020  At 1 pm and labwork needed prior to  [place] for Dec 09, 2018 at 3:05 pm.   Instruction  Verbal given to patient and sent to [facility]

## 2018-12-02 DIAGNOSIS — L570 Actinic keratosis: Secondary | ICD-10-CM | POA: Diagnosis not present

## 2018-12-02 DIAGNOSIS — D1801 Hemangioma of skin and subcutaneous tissue: Secondary | ICD-10-CM | POA: Diagnosis not present

## 2018-12-02 DIAGNOSIS — L820 Inflamed seborrheic keratosis: Secondary | ICD-10-CM | POA: Diagnosis not present

## 2018-12-02 DIAGNOSIS — L723 Sebaceous cyst: Secondary | ICD-10-CM | POA: Diagnosis not present

## 2018-12-02 DIAGNOSIS — L821 Other seborrheic keratosis: Secondary | ICD-10-CM | POA: Diagnosis not present

## 2018-12-02 DIAGNOSIS — L812 Freckles: Secondary | ICD-10-CM | POA: Diagnosis not present

## 2018-12-02 NOTE — Telephone Encounter (Signed)
Spoke to patient . Instruction sent via mychart . Will contact again on Monday to see if patient has any question.

## 2018-12-05 DIAGNOSIS — I4819 Other persistent atrial fibrillation: Secondary | ICD-10-CM | POA: Diagnosis not present

## 2018-12-05 DIAGNOSIS — I480 Paroxysmal atrial fibrillation: Secondary | ICD-10-CM | POA: Diagnosis not present

## 2018-12-06 DIAGNOSIS — H353122 Nonexudative age-related macular degeneration, left eye, intermediate dry stage: Secondary | ICD-10-CM | POA: Diagnosis not present

## 2018-12-06 DIAGNOSIS — H40013 Open angle with borderline findings, low risk, bilateral: Secondary | ICD-10-CM | POA: Diagnosis not present

## 2018-12-06 DIAGNOSIS — H532 Diplopia: Secondary | ICD-10-CM | POA: Diagnosis not present

## 2018-12-08 ENCOUNTER — Telehealth: Payer: Self-pay | Admitting: Cardiology

## 2018-12-08 NOTE — Telephone Encounter (Signed)
SPOKE WITH  DAUGHTER. SHE STATES PATIENT DOES NOT WANT TO PROCEED WITH CARDIOVERSION FOR 01/13/19   RN INFORMED DAUGHTER WILL CANCEL  COVID TEST AND CARDIOVERSION    WILL INFORM DR [place].  KEEP UPCOMING APPOINTMENT   DR HARDING AWARE. CANCELLATION IS COMPLETE.

## 2018-12-08 NOTE — Telephone Encounter (Signed)
New Message:       Pt's daughter wanted to cancel pt's COVID test for tomorrow and Cardioversion for [phone] please.

## 2018-12-08 NOTE — Telephone Encounter (Signed)
Left message to call back  

## 2018-12-08 NOTE — Telephone Encounter (Signed)
PATIENT CANCELLED  CARDIOVERSION PROCEDURE

## 2018-12-08 NOTE — Telephone Encounter (Signed)
LEFT MESSAGE FOR DAUGHTER TO CALL BACK

## 2018-12-09 ENCOUNTER — Other Ambulatory Visit (HOSPITAL_COMMUNITY): Payer: Medicare Other

## 2018-12-10 ENCOUNTER — Encounter (INDEPENDENT_AMBULATORY_CARE_PROVIDER_SITE_OTHER): Payer: Self-pay

## 2018-12-12 DIAGNOSIS — I480 Paroxysmal atrial fibrillation: Secondary | ICD-10-CM | POA: Diagnosis not present

## 2018-12-12 DIAGNOSIS — I4819 Other persistent atrial fibrillation: Secondary | ICD-10-CM | POA: Diagnosis not present

## 2018-12-12 DIAGNOSIS — Z20828 Contact with and (suspected) exposure to other viral communicable diseases: Secondary | ICD-10-CM | POA: Diagnosis not present

## 2018-12-13 ENCOUNTER — Encounter (HOSPITAL_COMMUNITY): Admission: RE | Payer: Self-pay | Source: Home / Self Care

## 2018-12-13 ENCOUNTER — Ambulatory Visit (HOSPITAL_COMMUNITY): Admission: RE | Admit: 2018-12-13 | Payer: Medicare Other | Source: Home / Self Care | Admitting: Cardiovascular Disease

## 2018-12-13 SURGERY — CARDIOVERSION
Anesthesia: General

## 2018-12-14 DIAGNOSIS — I440 Atrioventricular block, first degree: Secondary | ICD-10-CM | POA: Diagnosis not present

## 2018-12-14 DIAGNOSIS — I4819 Other persistent atrial fibrillation: Secondary | ICD-10-CM | POA: Diagnosis not present

## 2018-12-14 DIAGNOSIS — R001 Bradycardia, unspecified: Secondary | ICD-10-CM | POA: Diagnosis not present

## 2019-01-05 ENCOUNTER — Ambulatory Visit: Payer: Medicare Other | Admitting: Cardiology

## 2019-01-05 NOTE — Progress Notes (Deleted)
Primary Care Provider: [name], MD Cardiologist: No primary care provider on file. Electrophysiologist:   Clinic Note: No chief complaint on file.    HPI:    Eugene Friedman is a 85 y.o. male with a PMH below who presents today for ***. Eugene Friedman is a 85 y.o. male who is being seen today for the evaluation of *** at the request of [name], MD.  Eugene Friedman was last seen on ***  Recent Hospitalizations: ***  Decided against trial of DCCV - however - was notified by [facility] - start Flecainide - & planned DCCV (this was also canceled).  --> He was seen by Dr. Casper Harrison from [facility] cardiology on October 19 after being seen at [facility] neurology for spinal dural AVM.  Had noted exercise intolerance and was found to be in A. fib.  He went to [facility] for second opinion. --> They recommended flecainide and if Holter monitor.  Otherwise consider amiodarone. --> He underwent cardioversion on 12/14/2018  Reviewed  CV studies:    The following studies were reviewed today: (if available, images/films reviewed: From Epic Chart or Care Everywhere) . Event Monitor October 2020:- 100% Afib (Persistent Afib) -rate ranged from 45 down to 30 bpm.  Average 71 bpm.  Multiple pauses range of 3 to 3.6 seconds.  No symptoms noted.  Intermittent PVCs versus aberrantly conducted beats.  There was a 4 beat run of aberrantly conducted beats either VT or for combined PVCs.  Otherwise relatively stable . TTE 11/10/2018: EF 60 and 65%.  Mild LVH.  No wall LA dilation, moderate RA dilation.  Mild MR.  Mild aortic valve sclerosis but no stenosis.  Mild ascending aortic dilation -39 mm.   Interval History:   Eugene Friedman    CV Review of Symptoms (Summary) Cardiovascular ROS: {roscv:310661}  The patient {does/does not:200015} have symptoms concerning for COVID-19 infection (fever, chills, cough, or new shortness of breath).  The patient {ACTION; IS/IS [id number]  practicing social distancing. *** Masking.  *** Groceries/shopping. *** Work   REVIEWED OF SYSTEMS   A comprehensive ROS was performed. ROS   I have reviewed and (if needed) personally updated the patient's problem list, medications, allergies, past medical and surgical history, social and family history.   PAST MEDICAL HISTORY   Past Medical History:  Diagnosis Date  . Allergic rhinitis   . Atrial fibrillation by electrocardiogram ([place]) 10/2018   Was started on calcium channel blocker, beta-blocker and Xarelto  . Cancer [facility])    prostate  . DJD (degenerative joint disease)   . Hypertension    Borderline   . Sinus bradycardia   . Spinal stenosis    Complicated by dural AVM  . Vitamin D deficiency     PAST SURGICAL HISTORY   Past Surgical History:  Procedure Laterality Date  . APPENDECTOMY    . Back Procedure  11/02/2018   [facility] -vascular neuroradiology occlusion of dural AVM  . cervical neck mass   07/08/06   Left Excision left posterior cervical neck mass (2.5 cm). SURGEON:  Eugene Friedman, M.D., Eugene Friedman 07/08/06 and 12/05/07  . FLEXIBLE SIGMOIDOSCOPY    . HEMORRHOID SURGERY    . JOINT REPLACEMENT     Left knee 2013  . KNEE SURGERY    . MASTOIDECTOMY Left     Per report had a negative GXT stress test in 2017.  MEDICATIONS/ALLERGIES   No outpatient medications have been marked as taking for the 01/05/19 encounter (  Appointment) with Eugene Man, MD.    Allergies  Allergen Reactions  . Celebrex [Celecoxib] Other (See Comments)    GI upset  . Lipitor [Atorvastatin]   . Ppd [Tuberculin Purified Protein Derivative] Other (See Comments)    Positive PPD  . Adhesive [Tape] Rash    Zinc in the band-aid will cause a rash  . Nickel Rash     SOCIAL HISTORY/FAMILY HISTORY   Social History   Tobacco Use  . Smoking status: Never Smoker  . Smokeless tobacco: Never Used  Substance Use Topics  . Alcohol use: Yes    Alcohol/week: 10.0 standard drinks    Types:  10 Glasses of wine per week  . Drug use: No   Social History   Social History Narrative   Left handed   Caffeine 1.5 cups daily    Lives at home alone Wife passed.    Accompanied by his daughter.   Native [place] -> speaks English well    Family History family history includes Cancer in his maternal grandmother; Diabetes in his maternal aunt, mother, and paternal aunt; Heart attack (age of onset: 59) in his father; Hypertension in his father; Stroke in his mother.   OBJCTIVE -PE, EKG, labs   Wt Readings from Last 3 Encounters:  11/22/18 201 lb 12.8 oz (91.5 kg)  11/07/18 202 lb 9.6 oz (91.9 kg)  10/26/18 202 lb 3.2 oz (91.7 kg)    Physical Exam: There were no vitals taken for this visit. Physical Exam    Adult ECG Report  Rate: *** ;  Rhythm: {rhythm:17366};   Narrative Interpretation: ***  Recent Labs:  ***  Lab Results  Component Value Date   CHOL 203 (H) 10/26/2018   HDL 75 10/26/2018   LDLCALC 102 (H) 10/26/2018   TRIG 154 (H) 10/26/2018   CHOLHDL 2.7 10/26/2018   Lab Results  Component Value Date   CREATININE 1.29 (H) 10/26/2018   BUN 22 10/26/2018   NA 140 10/26/2018   K 4.7 10/26/2018   CL 106 10/26/2018   CO2 27 10/26/2018    ASSESSMENT/PLAN    Problem List Items Addressed This Visit    None       COVID-19 Education: The signs and symptoms of COVID-19 were discussed with the patient and how to seek care for testing (follow up with PCP or arrange E-visit).   The importance of social distancing was discussed today.  I spent a total of ***minutes with the patient and chart review. >  50% of the time was spent in direct patient consultation.  Additional time spent with chart review (studies, outside notes, etc): *** Total Time: *** min   Current medicines are reviewed at length with the patient today.  (+/- concerns) ***   Patient Instructions / Medication Changes & Studies & Tests Ordered   There are no Patient Instructions on file for  this visit.   Studies Ordered:   No orders of the defined types were placed in this encounter.    Glenetta Hew, M.D., M.S. Interventional Cardiologist   Pager # [phone] Phone # [phone] [address]. [place], [place] [address]   Thank you for choosing [facility] at [facility]!!

## 2019-01-07 ENCOUNTER — Other Ambulatory Visit: Payer: Self-pay | Admitting: Internal Medicine

## 2019-01-18 ENCOUNTER — Other Ambulatory Visit: Payer: Self-pay | Admitting: Adult Health

## 2019-01-18 DIAGNOSIS — R001 Bradycardia, unspecified: Secondary | ICD-10-CM | POA: Diagnosis not present

## 2019-01-18 DIAGNOSIS — I4819 Other persistent atrial fibrillation: Secondary | ICD-10-CM | POA: Diagnosis not present

## 2019-01-18 DIAGNOSIS — K582 Mixed irritable bowel syndrome: Secondary | ICD-10-CM

## 2019-01-22 DIAGNOSIS — Z20828 Contact with and (suspected) exposure to other viral communicable diseases: Secondary | ICD-10-CM | POA: Diagnosis not present

## 2019-01-22 DIAGNOSIS — I4819 Other persistent atrial fibrillation: Secondary | ICD-10-CM | POA: Diagnosis not present

## 2019-01-24 DIAGNOSIS — I440 Atrioventricular block, first degree: Secondary | ICD-10-CM | POA: Diagnosis not present

## 2019-01-24 DIAGNOSIS — R001 Bradycardia, unspecified: Secondary | ICD-10-CM | POA: Diagnosis not present

## 2019-01-24 DIAGNOSIS — R9431 Abnormal electrocardiogram [ECG] [EKG]: Secondary | ICD-10-CM | POA: Diagnosis not present

## 2019-01-24 DIAGNOSIS — I4819 Other persistent atrial fibrillation: Secondary | ICD-10-CM | POA: Diagnosis not present

## 2019-02-14 ENCOUNTER — Ambulatory Visit: Payer: Medicare Other | Admitting: Internal Medicine

## 2019-02-19 ENCOUNTER — Encounter: Payer: Self-pay | Admitting: Internal Medicine

## 2019-02-19 NOTE — Patient Instructions (Signed)

## 2019-02-19 NOTE — Progress Notes (Signed)
History of Present Illness:      This very nice 86 y.o. [place] presents for 3 month follow up with HTN,ASHD/Afib,  HLD, Pre-Diabetes and Vitamin D Deficiency. Patient is followed by active surveillance  for Gleason 6 Prostate Ca by  Dr Gerald Stabs Lovena Neighbours.      Patient has hx/o persistent Afib (1st dx'd 10/26/2018 and started Xarelto) & had CV at [facility] on 10/28 and 12/08. Apparently his Bisoprolol was d/c'd & he was started on Flecainide. He subsequently restarted his Bisoprolol when his BP occasionally was in the mid 140's. He also has noted heart rate down in the low 40's and denies any presyncopal sx's. Today's EKG shows sinus brady at 49 bpm.      Patient is treated for HTN circa 2001 & BP has been controlled at home. Today's BP is at goal -  132/60.  Patient had a negative Cardiolite in 2011 and a negative ETT in 2017. Patient has had no complaints of any cardiac type chest pain, palpitations, dyspnea / orthopnea / PND, dizziness, claudication, or dependent edema.      Hyperlipidemia is not controlled with diet & meds. Patient denies myalgias or other med SE's. Last Lipids were not at goal:  Lab Results  Component Value Date   CHOL 203 (H) 10/26/2018   HDL 75 10/26/2018   LDLCALC 102 (H) 10/26/2018   TRIG 154 (H) 10/26/2018   CHOLHDL 2.7 10/26/2018        Also, the patient has history of  PreDiabetes (A1c 6.1% / 2014)  and has had no symptoms of reactive hypoglycemia, diabetic polys, paresthesias or visual blurring.  Last A1c was Normal & at goal:  Lab Results  Component Value Date   HGBA1C 4.9 10/26/2018        Further, the patient also has history of Vitamin D Deficiency ("42" / 2009)  and supplements vitamin D without any suspected side-effects. Last vitamin D was not at goal (70-100): Lab Results  Component Value Date   VD25OH 48 10/26/2018    Current Outpatient Medications on File Prior to Visit  Medication Sig  . Cholecalciferol (VITAMIN D-3) 5000 UNITS TABS Take by mouth.    . dicyclomine (BENTYL) 20 MG tablet Take 1 tablet 3 x /day before Meals  if needed for Nausea, Cramping, Bloating or Dairrhea  . diltiazem (CARDIZEM CD) 120 MG 24 hr capsule Take 1 capsule Daily for BP & Heart Rhythm  . doxazosin (CARDURA) 8 MG tablet Take 1/2 to 1 tablet at Bedtime for Prostate & BP  . finasteride (PROSCAR) 5 MG tablet Take 1 tablet (5 mg total) by mouth daily.  . flecainide (TAMBOCOR) 100 MG tablet 100 mg daily.  . hyoscyamine (LEVSIN SL) 0.125 MG SL tablet DISSOLVE 1 TO 2 TABLETS UNDER THE TONGUE FOUR TIMES DAILY AS NEEDED FOR ABDOMINAL CRAMPS OR NAUSEA OR DIARRHEA  . LUTEIN PO Take 5 mg by mouth 2 (two) times daily.  Marland Kitchen neomycin-polymyxin-hydrocortisone (CORTISPORIN) 3.5-10000-1 OTIC suspension Place 4 drops into both ears 4 (four) times daily. (Patient taking differently: Place 4 drops into both ears 4 (four) times daily as needed. )  . rivaroxaban (XARELTO) 20 MG TABS tablet Take 1 tablet daily to Prevent blood Clots And Strokes   No current facility-administered medications on file prior to visit.    Allergies  Allergen Reactions  . Celebrex [Celecoxib] Other (See Comments)    GI upset  . Lipitor [Atorvastatin]   . Ppd [Tuberculin Purified Protein  Derivative] Other (See Comments)    Positive PPD  . Adhesive [Tape] Rash    Zinc in the band-aid will cause a rash  . Nickel Rash    PMHx:   Past Medical History:  Diagnosis Date  . Allergic rhinitis   . Atrial fibrillation by electrocardiogram ([place]) 10/2018   Was started on calcium channel blocker, beta-blocker and Xarelto  . Cancer [facility])    prostate  . DJD (degenerative joint disease)   . Hypertension    Borderline   . Sinus bradycardia   . Spinal stenosis    Complicated by dural AVM  . Vitamin D deficiency    Immunization History  Administered Date(s) Administered  . H1N1 02/17/2007  . Influenza Whole 12/05/2012  . Influenza, High Dose Seasonal PF 11/14/2013, 11/20/2014, 12/16/2015, 10/30/2016,  02/04/2018, 11/22/2018  . Influenza-Unspecified 12/05/2012  . Pneumococcal Conjugate-13 12/05/2014  . Pneumococcal Polysaccharide-23 03/08/2008, 05/18/2013  . Tdap 02/17/2008  . Zoster 02/16/2005   Past Surgical History:  Procedure Laterality Date  . APPENDECTOMY    . Back Procedure  11/02/2018   [facility] -vascular neuroradiology occlusion of dural AVM  . cervical neck mass   07/08/06   Left Excision left posterior cervical neck mass (2.5 cm). SURGEON:  Earnstine Regal, M.D., Allegra Grana 07/08/06 and 12/05/07  . FLEXIBLE SIGMOIDOSCOPY    . HEMORRHOID SURGERY    . JOINT REPLACEMENT     Left knee 2013  . KNEE SURGERY    . MASTOIDECTOMY Left     FHx:    Reviewed / unchanged  SHx:    Reviewed / unchanged   Systems Review:  Constitutional: Denies fever, chills, wt changes, headaches, insomnia, fatigue, night sweats, change in appetite. Eyes: Denies redness, blurred vision, diplopia, discharge, itchy, watery eyes.  ENT: Denies discharge, congestion, post nasal drip, epistaxis, sore throat, earache, hearing loss, dental pain, tinnitus, vertigo, sinus pain, snoring.  CV: Denies chest pain, palpitations, irregular heartbeat, syncope, dyspnea, diaphoresis, orthopnea, PND, claudication or edema. Respiratory: denies cough, dyspnea, DOE, pleurisy, hoarseness, laryngitis, wheezing.  Gastrointestinal: Denies dysphagia, odynophagia, heartburn, reflux, water brash, abdominal pain or cramps, nausea, vomiting, bloating, diarrhea, constipation, hematemesis, melena, hematochezia  or hemorrhoids. Genitourinary: Denies dysuria, frequency, urgency, nocturia, hesitancy, discharge, hematuria or flank pain. Musculoskeletal: Denies arthralgias, myalgias, stiffness, jt. swelling, pain, limping or strain/sprain.  Skin: Denies pruritus, rash, hives, warts, acne, eczema or change in skin lesion(s). Neuro: No weakness, tremor, incoordination, spasms, paresthesia or pain. Psychiatric: Denies confusion, memory loss or sensory  loss. Endo: Denies change in weight, skin or hair change.  Heme/Lymph: No excessive bleeding, bruising or enlarged lymph nodes.  Physical Exam  BP 132/60   Pulse (!) 52   Temp (!) 97.1 F (36.2 C)   Resp 16   Ht 6\' 2"  (1.88 m)   Wt 191 lb 3.2 oz (86.7 kg)   BMI 24.55 kg/m   Appears  well nourished, well groomed  and in no distress.  Eyes: PERRLA, EOMs, conjunctiva no swelling or erythema. Sinuses: No frontal/maxillary tenderness ENT/Mouth: EAC's clear, TM's nl w/o erythema, bulging. Nares clear w/o erythema, swelling, exudates. Oropharynx clear without erythema or exudates. Oral hygiene is good. Tongue normal, non obstructing. Hearing intact.  Neck: Supple. Thyroid not palpable. Car 2+/2+ without bruits, nodes or JVD. Chest: Respirations nl with BS clear & equal w/o rales, rhonchi, wheezing or stridor.  Cor: Heart sounds normal w/ regular rate and rhythm without sig. murmurs, gallops, clicks or rubs. Peripheral pulses normal and equal  without edema.  Abdomen: Soft & bowel sounds normal. Non-tender w/o guarding, rebound, hernias, masses or organomegaly.  Lymphatics: Unremarkable.  Musculoskeletal: Full ROM all peripheral extremities, joint stability, 5/5 strength and normal gait.  Skin: Warm, dry without exposed rashes, lesions or ecchymosis apparent.  Neuro: Cranial nerves intact, reflexes equal bilaterally. Sensory-motor testing grossly intact. Tendon reflexes grossly intact.  Pysch: Alert & oriented x 3.  Insight and judgement nl & appropriate. No ideations.  Assessment and Plan:  1. Essential hypertension  - Continue medication, monitor blood pressure at home.  - Continue DASH diet.  Reminder to go to the ER if any CP,  SOB, nausea, dizziness, severe HA, changes vision/speech.  - CBC with Diff - COMPLETE METABOLIC PANEL WITH GFR - Magnesium - TSH - EKG 12-Lead  2. Hyperlipidemia, mixed  - Continue diet/meds, exercise,& lifestyle modifications.  - Continue monitor  periodic cholesterol/liver & renal functions   - Lipid Profile - TSH - EKG 12-Lead  3. Abnormal glucose  - Fructosamine - Insulin, random - EKG 12-Lead  4. Vitamin D deficiency  - Continue diet, exercise  - Lifestyle modifications.  - Monitor appropriate labs. - Continue supplementation.  - Vitamin D (25 hydroxy)  5. Atrial fibrillation (HCC)  - TSH - EKG 12-Lead  6. Screening for ischemic heart disease  - EKG 12-Lead  7. Medication management  - CBC with Diff - COMPLETE METABOLIC PANEL WITH GFR - Magnesium - Lipid Profile - TSH - Fructosamine - Insulin, random - Vitamin D (25 hydroxy)         Discussed  regular exercise, BP monitoring, weight control to achieve/maintain BMI less than 25 and discussed med and SE's. Recommended labs to assess and monitor clinical status with further disposition pending results of labs.  I discussed the assessment and treatment plan with the patient. The patient was provided an opportunity to ask questions and all were answered. The patient agreed with the plan and demonstrated an understanding of the instructions.  I provided over 30 minutes of exam, counseling, chart review and  complex critical decision making.  Kirtland Bouchard, MD

## 2019-02-20 ENCOUNTER — Ambulatory Visit: Payer: Medicare Other | Admitting: Internal Medicine

## 2019-02-20 ENCOUNTER — Other Ambulatory Visit: Payer: Self-pay

## 2019-02-20 ENCOUNTER — Ambulatory Visit (INDEPENDENT_AMBULATORY_CARE_PROVIDER_SITE_OTHER): Payer: Medicare Other | Admitting: Internal Medicine

## 2019-02-20 VITALS — BP 132/60 | HR 52 | Temp 97.1°F | Resp 16 | Ht 74.0 in | Wt 191.2 lb

## 2019-02-20 DIAGNOSIS — E782 Mixed hyperlipidemia: Secondary | ICD-10-CM

## 2019-02-20 DIAGNOSIS — I10 Essential (primary) hypertension: Secondary | ICD-10-CM

## 2019-02-20 DIAGNOSIS — I4891 Unspecified atrial fibrillation: Secondary | ICD-10-CM | POA: Diagnosis not present

## 2019-02-20 DIAGNOSIS — E559 Vitamin D deficiency, unspecified: Secondary | ICD-10-CM

## 2019-02-20 DIAGNOSIS — R7309 Other abnormal glucose: Secondary | ICD-10-CM

## 2019-02-20 DIAGNOSIS — Z79899 Other long term (current) drug therapy: Secondary | ICD-10-CM | POA: Diagnosis not present

## 2019-02-20 DIAGNOSIS — Z136 Encounter for screening for cardiovascular disorders: Secondary | ICD-10-CM | POA: Diagnosis not present

## 2019-02-28 LAB — LIPID PANEL
Cholesterol: 221 mg/dL — ABNORMAL HIGH (ref <200)
HDL: 73 mg/dL (ref >=40)
LDL Cholesterol (Calc): 127 mg/dL (calc) — ABNORMAL HIGH
Non-HDL Cholesterol (Calc): 148 mg/dL (calc) — ABNORMAL HIGH (ref <130)
Total CHOL/HDL Ratio: 3 (calc) (ref <5.0)
Triglycerides: 103 mg/dL (ref <150)

## 2019-02-28 LAB — CBC WITH DIFFERENTIAL/PLATELET
Absolute Monocytes: 528 cells/uL (ref 200–950)
Basophils Absolute: 38 cells/uL (ref 0–200)
Basophils Relative: 0.8 %
Eosinophils Absolute: 139 cells/uL (ref 15–500)
Eosinophils Relative: 2.9 %
HCT: 41.2 % (ref 38.5–50.0)
Hemoglobin: 13.8 g/dL (ref 13.2–17.1)
Lymphs Abs: 1210 cells/uL (ref 850–3900)
MCH: 31.4 pg (ref 27.0–33.0)
MCHC: 33.5 g/dL (ref 32.0–36.0)
MCV: 93.8 fL (ref 80.0–100.0)
MPV: 9.8 fL (ref 7.5–12.5)
Monocytes Relative: 11 %
Neutro Abs: 2885 cells/uL (ref 1500–7800)
Neutrophils Relative %: 60.1 %
Platelets: 193 10*3/uL (ref 140–400)
RBC: 4.39 10*6/uL (ref 4.20–5.80)
RDW: 12.7 % (ref 11.0–15.0)
Total Lymphocyte: 25.2 %
WBC: 4.8 10*3/uL (ref 3.8–10.8)

## 2019-02-28 LAB — MAGNESIUM: Magnesium: 2.3 mg/dL (ref 1.5–2.5)

## 2019-02-28 LAB — COMPLETE METABOLIC PANEL WITH GFR
AG Ratio: 1.9 (calc) (ref 1.0–2.5)
ALT: 19 U/L (ref 9–46)
AST: 18 U/L (ref 10–35)
Albumin: 4.5 g/dL (ref 3.6–5.1)
Alkaline phosphatase (APISO): 69 U/L (ref 35–144)
BUN/Creatinine Ratio: 19 (calc) (ref 6–22)
BUN: 26 mg/dL — ABNORMAL HIGH (ref 7–25)
CO2: 28 mmol/L (ref 20–32)
Calcium: 9.4 mg/dL (ref 8.6–10.3)
Chloride: 105 mmol/L (ref 98–110)
Creat: 1.35 mg/dL — ABNORMAL HIGH (ref 0.70–1.11)
GFR, Est African American: 54 mL/min/{1.73_m2} — ABNORMAL LOW (ref >=60)
GFR, Est Non African American: 47 mL/min/{1.73_m2} — ABNORMAL LOW (ref >=60)
Globulin: 2.4 g/dL (calc) (ref 1.9–3.7)
Glucose, Bld: 99 mg/dL (ref 65–99)
Potassium: 4.6 mmol/L (ref 3.5–5.3)
Sodium: 142 mmol/L (ref 135–146)
Total Bilirubin: 0.8 mg/dL (ref 0.2–1.2)
Total Protein: 6.9 g/dL (ref 6.1–8.1)

## 2019-02-28 LAB — INSULIN, RANDOM: Insulin: 11.8 u[IU]/mL

## 2019-02-28 LAB — VITAMIN D 25 HYDROXY (VIT D DEFICIENCY, FRACTURES): Vit D, 25-Hydroxy: 47 ng/mL (ref 30–100)

## 2019-02-28 LAB — FRUCTOSAMINE: Fructosamine: 281 umol/L (ref 205–285)

## 2019-02-28 LAB — TSH: TSH: 2.48 mIU/L (ref 0.40–4.50)

## 2019-03-09 ENCOUNTER — Ambulatory Visit: Payer: Medicare Other | Attending: Internal Medicine

## 2019-03-09 DIAGNOSIS — Z23 Encounter for immunization: Secondary | ICD-10-CM | POA: Insufficient documentation

## 2019-03-09 NOTE — Progress Notes (Signed)
   Covid-19 Vaccination Clinic  Name:  Eugene Friedman    MRN: [id number] DOB: 28-Aug-1928  03/09/2019  Mr. Eugene Friedman was observed post Covid-19 immunization for 15 minutes without incidence. He was provided with Vaccine Information Sheet and instruction to access the V-Safe system.   Mr. Eugene Friedman was instructed to call 911 with any severe reactions post vaccine: Marland Kitchen Difficulty breathing  . Swelling of your face and throat  . A fast heartbeat  . A bad rash all over your body  . Dizziness and weakness    Immunizations Administered    Name Date Dose VIS Date Route   Pfizer COVID-19 Vaccine 03/09/2019 10:52 AM 0.3 mL 01/27/2019 Intramuscular   Manufacturer: [place]   Lot: [id number]   [place]: [id number]

## 2019-03-30 ENCOUNTER — Ambulatory Visit: Payer: Medicare Other | Attending: Internal Medicine

## 2019-03-30 DIAGNOSIS — Z23 Encounter for immunization: Secondary | ICD-10-CM

## 2019-03-30 NOTE — Progress Notes (Signed)
   Covid-19 Vaccination Clinic  Name:  Eugene Friedman    MRN: [id number] DOB: Feb 20, 1928  03/30/2019  Eugene Friedman was observed post Covid-19 immunization for 15 minutes without incidence. He was provided with Vaccine Information Sheet and instruction to access the V-Safe system.   Eugene Friedman was instructed to call 911 with any severe reactions post vaccine: Marland Kitchen Difficulty breathing  . Swelling of your face and throat  . A fast heartbeat  . A bad rash all over your body  . Dizziness and weakness    Immunizations Administered    Name Date Dose VIS Date Route   Pfizer COVID-19 Vaccine 03/30/2019 11:37 AM 0.3 mL 01/27/2019 Intramuscular   Manufacturer: [place]   Lot: [id number]   [place]: [id number]

## 2019-04-19 DIAGNOSIS — I4819 Other persistent atrial fibrillation: Secondary | ICD-10-CM | POA: Diagnosis not present

## 2019-04-19 DIAGNOSIS — R001 Bradycardia, unspecified: Secondary | ICD-10-CM | POA: Diagnosis not present

## 2019-04-27 ENCOUNTER — Encounter: Payer: Self-pay | Admitting: Internal Medicine

## 2019-04-27 ENCOUNTER — Other Ambulatory Visit: Payer: Self-pay

## 2019-04-27 ENCOUNTER — Ambulatory Visit (INDEPENDENT_AMBULATORY_CARE_PROVIDER_SITE_OTHER): Payer: Medicare Other | Admitting: Internal Medicine

## 2019-04-27 VITALS — BP 118/70 | HR 64 | Temp 97.3°F | Resp 16 | Ht 74.0 in | Wt 191.4 lb

## 2019-04-27 DIAGNOSIS — N401 Enlarged prostate with lower urinary tract symptoms: Secondary | ICD-10-CM | POA: Diagnosis not present

## 2019-04-27 DIAGNOSIS — N138 Other obstructive and reflux uropathy: Secondary | ICD-10-CM

## 2019-04-27 DIAGNOSIS — R7303 Prediabetes: Secondary | ICD-10-CM | POA: Diagnosis not present

## 2019-04-27 DIAGNOSIS — E782 Mixed hyperlipidemia: Secondary | ICD-10-CM | POA: Diagnosis not present

## 2019-04-27 DIAGNOSIS — I10 Essential (primary) hypertension: Secondary | ICD-10-CM

## 2019-04-27 DIAGNOSIS — I4891 Unspecified atrial fibrillation: Secondary | ICD-10-CM

## 2019-04-27 DIAGNOSIS — C61 Malignant neoplasm of prostate: Secondary | ICD-10-CM | POA: Diagnosis not present

## 2019-04-27 DIAGNOSIS — E559 Vitamin D deficiency, unspecified: Secondary | ICD-10-CM

## 2019-04-27 DIAGNOSIS — Z79899 Other long term (current) drug therapy: Secondary | ICD-10-CM | POA: Diagnosis not present

## 2019-04-27 DIAGNOSIS — R7309 Other abnormal glucose: Secondary | ICD-10-CM

## 2019-04-27 MED ORDER — TADALAFIL 5 MG PO TABS: ORAL_TABLET | ORAL | 3 refills | Status: DC

## 2019-04-27 MED ORDER — FLECAINIDE ACETATE 150 MG PO TABS: ORAL_TABLET | ORAL | 3 refills | Status: DC

## 2019-04-27 NOTE — Progress Notes (Signed)
History of Present Illness:      This very nice 86 y.o. [place] presents for 6 month follow up with HTN, ASHD/cAfib,  HLD, Pre-Diabetes and Vitamin D Deficiency.       Patient is treated for HTN (2001) & BP has been controlled at home. Patient had a negative Cardiolite in 2011.  Today's BP is at goal -118/70.  Patient was dx'd with Afib in Sept 2020 and was started on Xarelto.  Patient failed CV x 2 in Oct & Dec, 2020 at [facility]. Patient has had no complaints of any cardiac type chest pain, palpitations, dyspnea / orthopnea / PND, dizziness, claudication, or dependent edema.      Hyperlipidemia is not controlled with diet & meds. Patient denies myalgias or other med SE's. Last Lipids were not at goal:  Lab Results  Component Value Date   CHOL 221 (H) 02/20/2019   HDL 73 02/20/2019   LDLCALC 127 (H) 02/20/2019   TRIG 103 02/20/2019   CHOLHDL 3.0 02/20/2019    Also, the patient has history of  PreDiabetes  (A1c 6.1% / 2014)  and has had no symptoms of reactive hypoglycemia, diabetic polys, paresthesias or visual blurring.  Last A1c was Normal & at goal:  Lab Results  Component Value Date   HGBA1C 4.9 10/26/2018       Further, the patient also has history of Vitamin D Deficiency ("42" / 2009)  and supplements vitamin D without any suspected side-effects. Last vitamin D was still low (goal 70-100):  Lab Results  Component Value Date   VD25OH 47 02/20/2019    Current Outpatient Medications on File Prior to Visit  Medication Sig  . Cholecalciferol (VITAMIN D-3) 5000 UNITS TABS Take by mouth.  . dicyclomine (BENTYL) 20 MG tablet Take 1 tablet 3 x /day before Meals  if needed for Nausea, Cramping, Bloating or Dairrhea  . diltiazem (CARDIZEM CD) 120 MG 24 hr capsule Take 1 capsule Daily for BP & Heart Rhythm  . doxazosin (CARDURA) 8 MG tablet Take 1/2 to 1 tablet at Bedtime for Prostate & BP  . finasteride (PROSCAR) 5 MG tablet Take 1 tablet (5 mg total) by mouth daily.  . flecainide  (TAMBOCOR) 100 MG tablet 100 mg daily.  . hyoscyamine (LEVSIN SL) 0.125 MG SL tablet DISSOLVE 1 TO 2 TABLETS UNDER THE TONGUE FOUR TIMES DAILY AS NEEDED FOR ABDOMINAL CRAMPS OR NAUSEA OR DIARRHEA  . LUTEIN PO Take 5 mg by mouth 2 (two) times daily.  Marland Kitchen neomycin-polymyxin-hydrocortisone (CORTISPORIN) 3.5-10000-1 OTIC suspension Place 4 drops into both ears 4 (four) times daily. (Patient taking differently: Place 4 drops into both ears 4 (four) times daily as needed. )  . rivaroxaban (XARELTO) 20 MG TABS tablet Take 1 tablet daily to Prevent blood Clots And Strokes   No current facility-administered medications on file prior to visit.    Allergies  Allergen Reactions  . Celebrex [Celecoxib] Other (See Comments)    GI upset  . Lipitor [Atorvastatin]   . Ppd [Tuberculin Purified Protein Derivative] Other (See Comments)    Positive PPD  . Adhesive [Tape] Rash    Zinc in the band-aid will cause a rash  . Nickel Rash    PMHx:   Past Medical History:  Diagnosis Date  . Allergic rhinitis   . Atrial fibrillation by electrocardiogram ([place]) 10/2018   Was started on calcium channel blocker, beta-blocker and Xarelto  . Cancer [facility])    prostate  . DJD (  degenerative joint disease)   . Hypertension    Borderline   . Sinus bradycardia   . Spinal stenosis    Complicated by dural AVM  . Vitamin D deficiency     Immunization History  Administered Date(s) Administered  . H1N1 02/17/2007  . Influenza Whole 12/05/2012  . Influenza, High Dose Seasonal PF 11/14/2013, 11/20/2014, 12/16/2015, 10/30/2016, 02/04/2018, 11/22/2018  . Influenza-Unspecified 12/05/2012  . PFIZER SARS-COV-2 Vaccination 03/09/2019, 03/30/2019  . Pneumococcal Conjugate-13 12/05/2014  . Pneumococcal Polysaccharide-23 03/08/2008, 05/18/2013  . Tdap 02/17/2008  . Zoster 02/16/2005    Past Surgical History:  Procedure Laterality Date  . APPENDECTOMY    . Back Procedure  11/02/2018   [facility] -vascular neuroradiology  occlusion of dural AVM  . cervical neck mass   07/08/06   Left Excision left posterior cervical neck mass (2.5 cm). SURGEON:  Earnstine Regal, M.D., Allegra Grana 07/08/06 and 12/05/07  . FLEXIBLE SIGMOIDOSCOPY    . HEMORRHOID SURGERY    . JOINT REPLACEMENT     Left knee 2013  . KNEE SURGERY    . MASTOIDECTOMY Left     FHx:    Reviewed / unchanged  SHx:    Reviewed / unchanged   Systems Review:  Constitutional: Denies fever, chills, wt changes, headaches, insomnia, fatigue, night sweats, change in appetite. Eyes: Denies redness, blurred vision, diplopia, discharge, itchy, watery eyes.  ENT: Denies discharge, congestion, post nasal drip, epistaxis, sore throat, earache, hearing loss, dental pain, tinnitus, vertigo, sinus pain, snoring.  CV: Denies chest pain, palpitations, irregular heartbeat, syncope, dyspnea, diaphoresis, orthopnea, PND, claudication or edema. Respiratory: denies cough, dyspnea, DOE, pleurisy, hoarseness, laryngitis, wheezing.  Gastrointestinal: Denies dysphagia, odynophagia, heartburn, reflux, water brash, abdominal pain or cramps, nausea, vomiting, bloating, diarrhea, constipation, hematemesis, melena, hematochezia  or hemorrhoids. Genitourinary: Denies dysuria, frequency, urgency, nocturia, hesitancy, discharge, hematuria or flank pain. Musculoskeletal: Denies arthralgias, myalgias, stiffness, jt. swelling, pain, limping or strain/sprain.  Skin: Denies pruritus, rash, hives, warts, acne, eczema or change in skin lesion(s). Neuro: No weakness, tremor, incoordination, spasms, paresthesia or pain. Psychiatric: Denies confusion, memory loss or sensory loss. Endo: Denies change in weight, skin or hair change.  Heme/Lymph: No excessive bleeding, bruising or enlarged lymph nodes.  Physical Exam  BP 118/70   Pulse 64   Temp (!) 97.3 F (36.3 C)   Resp 16   Ht 6\' 2"  (1.88 m)   Wt 191 lb 6.4 oz (86.8 kg)   BMI 24.57 kg/m   Appears  well nourished, well groomed  and in no  distress.  Eyes: PERRLA, EOMs, conjunctiva no swelling or erythema. Sinuses: No frontal/maxillary tenderness ENT/Mouth: EAC's clear, TM's nl w/o erythema, bulging. Nares clear w/o erythema, swelling, exudates. Oropharynx clear without erythema or exudates. Oral hygiene is good. Tongue normal, non obstructing. Hearing intact.  Neck: Supple. Thyroid not palpable. Car 2+/2+ without bruits, nodes or JVD. Chest: Respirations nl with BS clear & equal w/o rales, rhonchi, wheezing or stridor.  Cor: Heart sounds normal w/ regular rate and rhythm without sig. murmurs, gallops, clicks or rubs. Peripheral pulses normal and equal  without edema.  Abdomen: Soft & bowel sounds normal. Non-tender w/o guarding, rebound, hernias, masses or organomegaly.  Lymphatics: Unremarkable.  Musculoskeletal: Full ROM all peripheral extremities, joint stability, 5/5 strength and normal gait.  Skin: Warm, dry without exposed rashes, lesions or ecchymosis apparent.  Neuro: Cranial nerves intact, reflexes equal bilaterally. Sensory-motor testing grossly intact. Tendon reflexes grossly intact.  Pysch: Alert & oriented x 3.  Insight and  judgement nl & appropriate. No ideations.  Assessment and Plan:  1. Essential hypertension  - Continue medication, monitor blood pressure at home.  - Continue DASH diet.  Reminder to go to the ER if any CP,  SOB, nausea, dizziness, severe HA, changes vision/speech.  - CBC with Differential/Platelet - COMPLETE METABOLIC PANEL WITH GFR - Magnesium - TSH  2. Hyperlipidemia, mixed  - Continue diet/meds, exercise,& lifestyle modifications.  - Continue monitor periodic cholesterol/liver & renal functions   - Lipid panel - TSH  3. Abnormal glucose  - Continue diet, exercise  - Lifestyle modifications.  - Monitor appropriate labs.  - Hemoglobin A1c - Insulin, random  4. Vitamin D deficiency  - Continue supplementation.  - VITAMIN D 25 Hydroxy  5. Prediabetes  - Hemoglobin  A1c - Insulin, random  6. Atrial fibrillation, permanent (HCC)  - TSH  7. Medication management  - CBC with Differential/Platelet - COMPLETE METABOLIC PANEL WITH GFR - Magnesium - Lipid panel - TSH - Hemoglobin A1c - Insulin, random - VITAMIN D 25 Hydroxyl        Discussed  regular exercise, BP monitoring, weight control to achieve/maintain BMI less than 25 and discussed med and SE's. Recommended labs to assess and monitor clinical status with further disposition pending results of labs.  I discussed the assessment and treatment plan with the patient. The patient was provided an opportunity to ask questions and all were answered. The patient agreed with the plan and demonstrated an understanding of the instructions.  I provided over 30 minutes of exam, counseling, chart review and  complex critical decision making.   Kirtland Bouchard, MD

## 2019-04-27 NOTE — Patient Instructions (Signed)

## 2019-04-28 LAB — COMPLETE METABOLIC PANEL WITH GFR
AG Ratio: 2.2 (calc) (ref 1.0–2.5)
ALT: 19 U/L (ref 9–46)
AST: 20 U/L (ref 10–35)
Albumin: 4.6 g/dL (ref 3.6–5.1)
Alkaline phosphatase (APISO): 62 U/L (ref 35–144)
BUN/Creatinine Ratio: 17 (calc) (ref 6–22)
BUN: 25 mg/dL (ref 7–25)
CO2: 27 mmol/L (ref 20–32)
Calcium: 9.4 mg/dL (ref 8.6–10.3)
Chloride: 104 mmol/L (ref 98–110)
Creat: 1.43 mg/dL — ABNORMAL HIGH (ref 0.70–1.11)
GFR, Est African American: 50 mL/min/{1.73_m2} — ABNORMAL LOW (ref >=60)
GFR, Est Non African American: 43 mL/min/{1.73_m2} — ABNORMAL LOW (ref >=60)
Globulin: 2.1 g/dL (calc) (ref 1.9–3.7)
Glucose, Bld: 101 mg/dL — ABNORMAL HIGH (ref 65–99)
Potassium: 5 mmol/L (ref 3.5–5.3)
Sodium: 138 mmol/L (ref 135–146)
Total Bilirubin: 0.7 mg/dL (ref 0.2–1.2)
Total Protein: 6.7 g/dL (ref 6.1–8.1)

## 2019-04-28 LAB — PSA: PSA: 3.8 ng/mL (ref <=4.0)

## 2019-04-28 LAB — CBC WITH DIFFERENTIAL/PLATELET
Absolute Monocytes: 496 cells/uL (ref 200–950)
Basophils Absolute: 49 cells/uL (ref 0–200)
Basophils Relative: 1.2 %
Eosinophils Absolute: 90 cells/uL (ref 15–500)
Eosinophils Relative: 2.2 %
HCT: 38.8 % (ref 38.5–50.0)
Hemoglobin: 13.1 g/dL — ABNORMAL LOW (ref 13.2–17.1)
Lymphs Abs: 902 cells/uL (ref 850–3900)
MCH: 31.6 pg (ref 27.0–33.0)
MCHC: 33.8 g/dL (ref 32.0–36.0)
MCV: 93.5 fL (ref 80.0–100.0)
MPV: 9.6 fL (ref 7.5–12.5)
Monocytes Relative: 12.1 %
Neutro Abs: 2563 cells/uL (ref 1500–7800)
Neutrophils Relative %: 62.5 %
Platelets: 208 10*3/uL (ref 140–400)
RBC: 4.15 10*6/uL — ABNORMAL LOW (ref 4.20–5.80)
RDW: 13.1 % (ref 11.0–15.0)
Total Lymphocyte: 22 %
WBC: 4.1 10*3/uL (ref 3.8–10.8)

## 2019-04-28 LAB — VITAMIN D 25 HYDROXY (VIT D DEFICIENCY, FRACTURES): Vit D, 25-Hydroxy: 57 ng/mL (ref 30–100)

## 2019-04-28 LAB — INSULIN, RANDOM: Insulin: 5.6 u[IU]/mL

## 2019-04-28 LAB — LIPID PANEL
Cholesterol: 218 mg/dL — ABNORMAL HIGH (ref <200)
HDL: 83 mg/dL (ref >=40)
LDL Cholesterol (Calc): 117 mg/dL (calc) — ABNORMAL HIGH
Non-HDL Cholesterol (Calc): 135 mg/dL (calc) — ABNORMAL HIGH (ref <130)
Total CHOL/HDL Ratio: 2.6 (calc) (ref <5.0)
Triglycerides: 85 mg/dL (ref <150)

## 2019-04-28 LAB — HEMOGLOBIN A1C
Hgb A1c MFr Bld: 5 % of total Hgb (ref <5.7)
Mean Plasma Glucose: 97 (calc)
eAG (mmol/L): 5.4 (calc)

## 2019-04-28 LAB — TSH: TSH: 1.81 mIU/L (ref 0.40–4.50)

## 2019-04-28 LAB — MAGNESIUM: Magnesium: 2.4 mg/dL (ref 1.5–2.5)

## 2019-04-30 ENCOUNTER — Encounter: Payer: Self-pay | Admitting: Internal Medicine

## 2019-05-23 DIAGNOSIS — L570 Actinic keratosis: Secondary | ICD-10-CM | POA: Diagnosis not present

## 2019-05-23 DIAGNOSIS — L821 Other seborrheic keratosis: Secondary | ICD-10-CM | POA: Diagnosis not present

## 2019-05-23 DIAGNOSIS — D1801 Hemangioma of skin and subcutaneous tissue: Secondary | ICD-10-CM | POA: Diagnosis not present

## 2019-05-24 ENCOUNTER — Encounter: Payer: Self-pay | Admitting: Internal Medicine

## 2019-06-09 DIAGNOSIS — I770 Arteriovenous fistula, acquired: Secondary | ICD-10-CM | POA: Diagnosis not present

## 2019-06-09 DIAGNOSIS — I671 Cerebral aneurysm, nonruptured: Secondary | ICD-10-CM | POA: Diagnosis not present

## 2019-06-09 DIAGNOSIS — I4891 Unspecified atrial fibrillation: Secondary | ICD-10-CM | POA: Diagnosis not present

## 2019-06-13 NOTE — Progress Notes (Signed)
MEDICARE ANNUAL WELLNESS VISIT AND FOLLOW UP  Assessment:   Encounter for Medicare annual wellness exam 1 year  Labile hypertension -     CBC with Differential/Platelet -     COMPLETE METABOLIC PANEL WITH GFR -     TSH - continue medications, DASH diet, exercise and monitor at home. Call if greater than 130/80.   Mixed hyperlipidemia -     Lipid panel check lipids decrease fatty foods increase activity.   Prostate cancer ([place]) Continue to monitor  Persistent atrial fibrillation: CHA2DS2Vasc = 3 (Age & HTN) Continue medications and cardio follow up NSR today  History of elevated glucose Monitor  Medication management -     Magnesium  Vitamin D deficiency -     VITAMIN D 25 Hydroxy (Vit-D Deficiency, Fractures)  AVM (arteriovenous malformation) Monitor  Spinal stenosis, unspecified spinal region Continue PT  Sinus bradycardia Monitor  Atrial fibrillation, unspecified type (HCC) -     diltiazem (CARDIZEM CD) 120 MG 24 hr capsule; Take 1 capsule Daily for BP & Heart Rhythm  Need for vaccine for DT (diphtheria-tetanus) -     Td vaccine greater than or equal to 7yo preservative free IM  Other orders -     neomycin-polymyxin-hydrocortisone (CORTISPORIN) 3.5-10000-1 OTIC suspension; Place 4 drops into both ears 4 (four) times daily as needed.  Over 30 minutes of exam, counseling, chart review, and critical decision making was performed  Future Appointments  Date Time Provider [place]  07/19/2019  9:45 AM Vicie Mutters, PA-C [facility] None  11/23/2019  3:00 PM [name], MD [facility] None     Plan:   During the course of the visit the patient was educated and counseled about appropriate screening and preventive services including:    Pneumococcal vaccine   Influenza vaccine  Prevnar 13  Td vaccine  Screening electrocardiogram  Colorectal cancer screening  Diabetes screening  Glaucoma screening  Nutrition counseling     Subjective:  Eugene Friedman is a 86 y.o. [place] male who presents for Medicare Annual Wellness Visit and 3 month follow up for HTN, hyperlipidemia, prediabetes, and vitamin D Def.   He would like to see his GF in [place]. Compelted COVID vaccines 03/2019  He is following with Dr. 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 Continues to do PT and will follow up for surveillance.  He has persistent Afib x sept 2020, on flecainide, s/p failed cardioversion in 11/2018. He is on cardizem 120 mg daily. Following with   Follows with Dr. Lovena Neighbours for history of prostate cancer x 2007, active surveillance.    He has IBS, takes pill occ that helps. No blood in stool, black stool. No nausea, no weight loss.   BMI is Body mass index is 24.52 kg/m., he is working on diet and exercise. Wt Readings from Last 3 Encounters:  06/14/19 191 lb (86.6 kg)  04/27/19 191 lb 6.4 oz (86.8 kg)  02/20/19 191 lb 3.2 oz (86.7 kg)   His blood pressure has been controlled at home at home are under 120/60's, today their BP is BP: 120/68  BP Readings from Last 3 Encounters:  06/14/19 120/68  04/27/19 118/70  02/20/19 132/60    He does workout. He denies chest pain, shortness of breath, dizziness.  He is on cholesterol medication and denies myalgias. His cholesterol is not at goal. The cholesterol last visit was:   Lab Results  Component Value Date  CHOL 218 (H) 04/27/2019   HDL 83 04/27/2019   LDLCALC 117 (H) 04/27/2019   TRIG 85 04/27/2019   CHOLHDL 2.6 04/27/2019    He has been working on diet and exercise for prediabetes, and denies paresthesia of the feet, polydipsia, polyuria and visual disturbances. Last A1C in the office was:  Lab Results  Component Value Date   HGBA1C 5.0 04/27/2019   Last GFR Lab Results  Component Value Date   GFRNONAA 43 (L) 04/27/2019   Patient is on Vitamin D supplement.   Lab Results  Component Value Date   VD25OH 57  04/27/2019      Medication Review:   Current Outpatient Medications (Cardiovascular):  .  diltiazem (CARDIZEM CD) 120 MG 24 hr capsule, Take 1 capsule Daily for BP & Heart Rhythm .  doxazosin (CARDURA) 8 MG tablet, Take 1/2 to 1 tablet at Bedtime for Prostate & BP .  flecainide (TAMBOCOR) 150 MG tablet, Take 1 tablet Daily   for Heart Rhythm .  tadalafil (CIALIS) 5 MG tablet, Take 1/2 to 1 tablet Daily for Prostate    Current Outpatient Medications (Hematological):  .  rivaroxaban (XARELTO) 20 MG TABS tablet, Take 1 tablet daily to Prevent blood Clots And Strokes  Current Outpatient Medications (Other):  Marland Kitchen  Cholecalciferol (VITAMIN D-3) 5000 UNITS TABS, Take by mouth. .  dicyclomine (BENTYL) 20 MG tablet, Take 1 tablet 3 x /day before Meals  if needed for Nausea, Cramping, Bloating or Dairrhea .  finasteride (PROSCAR) 5 MG tablet, Take 1 tablet (5 mg total) by mouth daily. .  hyoscyamine (LEVSIN SL) 0.125 MG SL tablet, DISSOLVE 1 TO 2 TABLETS UNDER THE TONGUE FOUR TIMES DAILY AS NEEDED FOR ABDOMINAL CRAMPS OR NAUSEA OR DIARRHEA .  LUTEIN PO, Take 5 mg by mouth 2 (two) times daily. Marland Kitchen  neomycin-polymyxin-hydrocortisone (CORTISPORIN) 3.5-10000-1 OTIC suspension, Place 4 drops into both ears 4 (four) times daily as needed.  Allergies: Allergies  Allergen Reactions  . Celebrex [Celecoxib] Other (See Comments)    GI upset  . Lipitor [Atorvastatin]   . Ppd [Tuberculin Purified Protein Derivative] Other (See Comments)    Positive PPD  . Adhesive [Tape] Rash    Zinc in the band-aid will cause a rash  . Nickel Rash    Current Problems (verified) has Hyperlipidemia; Sinus bradycardia; Vitamin D deficiency; History of elevated glucose; Elevated prostate specific antigen (PSA); Labile hypertension; Medication management; Prostate cancer ([place]); Spinal stenosis; Persistent atrial fibrillation: CHA2DS2Vasc = 3 (Age & HTN); and AVM (arteriovenous malformation) on their problem  list.  Screening Tests Immunization History  Administered Date(s) Administered  . H1N1 02/17/2007  . Influenza Whole 12/05/2012  . Influenza, High Dose Seasonal PF 11/14/2013, 11/20/2014, 12/16/2015, 10/30/2016, 02/04/2018, 11/22/2018  . Influenza-Unspecified 12/05/2012  . PFIZER SARS-COV-2 Vaccination 03/09/2019, 03/30/2019  . Pneumococcal Conjugate-13 12/05/2014  . Pneumococcal Polysaccharide-23 03/08/2008, 05/18/2013  . Td 06/14/2019  . Tdap 02/17/2008  . Zoster 02/16/2005   Preventative care: Last colonoscopy: more than 10 years ago, will not get another due to age  Prior vaccinations: TD or Tdap: 2010 DUE Influenza: 2020 COVID vaccines completed 03/30/2019 Pneumococcal: 2015 Prevnar13: 2016 Shingles/Zostavax: 2007  Names of Other Physician/Practitioners you currently use: 1. [facility] here for primary care 2. [facility], eye doctor, last visit 10/2017 3. Mariane Masters, dentist, last visit 2019 Patient Care Team: [name], MD as PCP - General (Internal Medicine) Calvert Cantor, MD as Consulting Physician (Ophthalmology) Nahser, Wonda Cheng, MD as Consulting Physician (Cardiology)  Armandina Gemma, MD as Consulting Physician (General Surgery) Garald Balding, MD as Consulting Physician (Orthopedic Surgery)  Surgical: He  has a past surgical history that includes cervical neck mass  (07/08/06); Appendectomy; Mastoidectomy (Left); Flexible sigmoidoscopy; Knee surgery; Hemorrhoid surgery; Joint replacement; and Back Procedure (11/02/2018). Family His family history includes Cancer in his maternal grandmother; Diabetes in his maternal aunt, mother, and paternal aunt; Heart attack (age of onset: 69) in his father; Hypertension in his father; Stroke in his mother. Social history  He reports that he has never smoked. He has never used smokeless tobacco. He reports current alcohol use of about 10.0 standard drinks of alcohol per week. He reports that  he does not use drugs.  MEDICARE WELLNESS OBJECTIVES: Physical activity: Current Exercise Habits: Home exercise routine, Type of exercise: walking;stretching(yard work), Intensity: Mild, Exercise limited by: orthopedic condition(s) Cardiac risk factors: Cardiac Risk Factors include: advanced age (>42men, >42 women);dyslipidemia;hypertension;male gender Depression/mood screen:   Depression screen [facility] 2/9 06/14/2019  Decreased Interest 0  Down, Depressed, Hopeless 0  PHQ - 2 Score 0    ADLs:  In your present state of health, do you have any difficulty performing the following activities: 06/14/2019 04/30/2019  Hearing? Y N  Comment wears hearing aids -  Vision? N N  Difficulty concentrating or making decisions? N N  Walking or climbing stairs? Y N  Comment improved with PT -  Dressing or bathing? N N  Doing errands, shopping? N N  Some recent data might be hidden     Cognitive Testing  Alert? Yes  Normal Appearance?Yes  Oriented to person? Yes  Place? Yes   Time? Yes  Recall of three objects?  Yes  Can perform simple calculations? Yes  Displays appropriate judgment?Yes  Can read the correct time from a watch face?Yes  EOL planning: Does Patient Have a Medical Advance Directive?: Yes Type of Advance Directive: Healthcare Power of Attorney, Living will Does patient want to make changes to medical advance directive?: No - Patient declined Copy of [place] in Chart?: No - copy requested   Objective:   Today's Vitals   06/14/19 1015  BP: 120/68  Pulse: 64  Temp: (!) 97.5 F (36.4 C)  SpO2: 96%  Weight: 191 lb (86.6 kg)  Height: 6\' 2"  (1.88 m)  PainSc: 0-No pain   Body mass index is 24.52 kg/m.  General appearance: alert, no distress, WD/WN, male HEENT: normocephalic, sclerae anicteric, TMs pearly, nares patent, no discharge or erythema, pharynx normal Oral cavity: MMM, no lesions Neck: supple, no lymphadenopathy, no thyromegaly, no masses Heart:  RRR, normal S1, S2, no murmurs Lungs: CTA bilaterally, no wheezes, rhonchi, or rales Abdomen: +bs, soft, non tender, non distended, no masses, no hepatomegaly, no splenomegaly Musculoskeletal: nontender, no swelling, no obvious deformity Extremities: no edema, no cyanosis, no clubbing Pulses: 2+ symmetric, upper and lower extremities, normal cap refill Neurological: alert, oriented x 3, CN2-12 intact, strength normal upper extremities and lower extremities, sensation normal throughout, DTRs 2+ throughout, no cerebellar signs, gait normal Psychiatric: normal affect, behavior normal, pleasant   Medicare Attestation I have personally reviewed: The patient's medical and social history Their use of alcohol, tobacco or illicit drugs Their current medications and supplements The patient's functional ability including ADLs,fall risks, home safety risks, cognitive, and hearing and visual impairment Diet and physical activities Evidence for depression or mood disorders  The patient's weight, height, BMI, and visual acuity have been recorded in the chart.  I have made  referrals, counseling, and provided education to the patient based on review of the above and I have provided the patient with a written personalized care plan for preventive services.     Vicie Mutters, PA-C   06/14/2019

## 2019-06-14 ENCOUNTER — Encounter: Payer: Self-pay | Admitting: Physician Assistant

## 2019-06-14 ENCOUNTER — Other Ambulatory Visit: Payer: Self-pay

## 2019-06-14 ENCOUNTER — Ambulatory Visit (INDEPENDENT_AMBULATORY_CARE_PROVIDER_SITE_OTHER): Payer: Medicare Other | Admitting: Physician Assistant

## 2019-06-14 ENCOUNTER — Other Ambulatory Visit: Payer: Self-pay | Admitting: Internal Medicine

## 2019-06-14 VITALS — BP 120/68 | HR 64 | Temp 97.5°F | Ht 74.0 in | Wt 191.0 lb

## 2019-06-14 DIAGNOSIS — R001 Bradycardia, unspecified: Secondary | ICD-10-CM

## 2019-06-14 DIAGNOSIS — Z0000 Encounter for general adult medical examination without abnormal findings: Secondary | ICD-10-CM

## 2019-06-14 DIAGNOSIS — I4819 Other persistent atrial fibrillation: Secondary | ICD-10-CM

## 2019-06-14 DIAGNOSIS — I4891 Unspecified atrial fibrillation: Secondary | ICD-10-CM

## 2019-06-14 DIAGNOSIS — Z79899 Other long term (current) drug therapy: Secondary | ICD-10-CM

## 2019-06-14 DIAGNOSIS — R6889 Other general symptoms and signs: Secondary | ICD-10-CM

## 2019-06-14 DIAGNOSIS — Q2730 Arteriovenous malformation, site unspecified: Secondary | ICD-10-CM

## 2019-06-14 DIAGNOSIS — R0989 Other specified symptoms and signs involving the circulatory and respiratory systems: Secondary | ICD-10-CM | POA: Diagnosis not present

## 2019-06-14 DIAGNOSIS — C61 Malignant neoplasm of prostate: Secondary | ICD-10-CM

## 2019-06-14 DIAGNOSIS — Z0001 Encounter for general adult medical examination with abnormal findings: Secondary | ICD-10-CM

## 2019-06-14 DIAGNOSIS — Z8639 Personal history of other endocrine, nutritional and metabolic disease: Secondary | ICD-10-CM | POA: Diagnosis not present

## 2019-06-14 DIAGNOSIS — E782 Mixed hyperlipidemia: Secondary | ICD-10-CM | POA: Diagnosis not present

## 2019-06-14 DIAGNOSIS — M25571 Pain in right ankle and joints of right foot: Secondary | ICD-10-CM

## 2019-06-14 DIAGNOSIS — M4800 Spinal stenosis, site unspecified: Secondary | ICD-10-CM | POA: Diagnosis not present

## 2019-06-14 DIAGNOSIS — Z23 Encounter for immunization: Secondary | ICD-10-CM | POA: Diagnosis not present

## 2019-06-14 DIAGNOSIS — E559 Vitamin D deficiency, unspecified: Secondary | ICD-10-CM | POA: Diagnosis not present

## 2019-06-14 DIAGNOSIS — L97319 Non-pressure chronic ulcer of right ankle with unspecified severity: Secondary | ICD-10-CM

## 2019-06-14 MED ORDER — NEOMYCIN-POLYMYXIN-HC 3.5-10000-1 OT SUSP: 4.0000 [drp] | Freq: Four times a day (QID) | OTIC | 4 refills | Status: DC | PRN

## 2019-06-14 MED ORDER — DILTIAZEM HCL ER COATED BEADS 120 MG PO CP24: ORAL_CAPSULE | ORAL | 4 refills | Status: DC

## 2019-06-14 NOTE — Patient Instructions (Signed)
Let us know when your going to [place] and we can send in a 90 day  [place]  Know what a healthy weight is for you (roughly BMI <25) and aim to maintain this  Aim for 7+ servings of fruits and vegetables daily  70-80+ fluid ounces of water or unsweet tea for healthy kidneys  Limit to max 1 drink of alcohol per day; avoid smoking/tobacco  Limit animal fats in diet for cholesterol and heart health - choose grass fed whenever available  Avoid highly processed foods, and foods high in saturated/trans fats  Aim for low stress - take time to unwind and care for your mental health  Aim for 150 min of moderate intensity exercise weekly for heart health, and weights twice weekly for bone health  Aim for 7-9 hours of sleep daily   Diet for Irritable Bowel Syndrome When you have irritable bowel syndrome (IBS), it is very important to eat the foods and follow the eating habits that are best for your condition. IBS may cause various symptoms such as pain in the abdomen, constipation, or diarrhea. Choosing the right foods can help to ease the discomfort from these symptoms. Work with your health care provider and diet and nutrition specialist (dietitian) to find the eating plan that will help to control your symptoms. What are tips for following this plan?      Keep a food diary. This will help you identify foods that cause symptoms. Write down: ? What you eat and when you eat it. ? What symptoms you have. ? When symptoms occur in relation to your meals, such as "pain in abdomen 2 hours after dinner."  Eat your meals slowly and in a relaxed setting.  Aim to eat 5-6 small meals per day. Do not skip meals.  Drink enough fluid to keep your urine pale yellow.  Ask your health care provider if you should take an over-the-counter probiotic to help restore healthy bacteria in your gut (digestive tract). ? Probiotics are foods that contain good bacteria and yeasts.  Your dietitian  may have specific dietary recommendations for you based on your symptoms. He or she may recommend that you: ? Avoid foods that cause symptoms. Talk with your dietitian about other ways to get the same nutrients that are in those problem foods. ? Avoid foods with gluten. Gluten is a protein that is found in rye, wheat, and barley. ? Eat more foods that contain soluble fiber. Examples of foods with high soluble fiber include oats, seeds, and certain fruits and vegetables. Take a fiber supplement if directed by your dietitian. ? Reduce or avoid certain foods called FODMAPs. These are foods that contain carbohydrates that are hard to digest. Ask your doctor which foods contain these carbohydrates. What foods are not recommended? The following are some foods and drinks that may make your symptoms worse:  Fatty foods, such as french fries.  Foods that contain gluten, such as pasta and cereal.  Dairy products, such as milk, cheese, and ice cream.  Chocolate.  Alcohol.  Products with caffeine, such as coffee.  Carbonated drinks, such as soda.  Foods that are high in FODMAPs. These include certain fruits and vegetables.  Products with sweeteners such as honey, high fructose corn syrup, sorbitol, and mannitol. The items listed above may not be a complete list of foods and beverages you should avoid. Contact a dietitian for more information. What foods are good sources of fiber? Your health care provider or dietitian may  recommend that you eat more foods that contain fiber. Fiber can help to reduce constipation and other IBS symptoms. Add foods with fiber to your diet a little at a time so your body can get used to them. Too much fiber at one time might cause gas and swelling of your abdomen. The following are some foods that are good sources of fiber:  Berries, such as raspberries, strawberries, and blueberries.  Tomatoes.  Carrots.  Brown rice.  Oats.  Seeds, such as chia and pumpkin  seeds. The items listed above may not be a complete list of recommended sources of fiber. Contact your dietitian for more options. Where to find more information  International Foundation for Functional Gastrointestinal Disorders: www.iffgd.[employer] of Diabetes and Digestive and Kidney Diseases: [employer] Summary  When you have irritable bowel syndrome (IBS), it is very important to eat the foods and follow the eating habits that are best for your condition.  IBS may cause various symptoms such as pain in the abdomen, constipation, or diarrhea.  Choosing the right foods can help to ease the discomfort that comes from symptoms.  Keep a food diary. This will help you identify foods that cause symptoms.  Your health care provider or diet and nutrition specialist (dietitian) may recommend that you eat more foods that contain fiber. This information is not intended to replace advice given to you by your health care provider. Make sure you discuss any questions you have with your health care provider. Document Revised: 05/25/2018 Document Reviewed: 10/06/2016 Elsevier Patient Education  [place].

## 2019-06-15 LAB — CBC WITH DIFFERENTIAL/PLATELET
Absolute Monocytes: 488 cells/uL (ref 200–950)
Basophils Absolute: 22 cells/uL (ref 0–200)
Basophils Relative: 0.5 %
Eosinophils Absolute: 79 cells/uL (ref 15–500)
Eosinophils Relative: 1.8 %
HCT: 38.7 % (ref 38.5–50.0)
Hemoglobin: 13.1 g/dL — ABNORMAL LOW (ref 13.2–17.1)
Lymphs Abs: 805 cells/uL — ABNORMAL LOW (ref 850–3900)
MCH: 31.7 pg (ref 27.0–33.0)
MCHC: 33.9 g/dL (ref 32.0–36.0)
MCV: 93.7 fL (ref 80.0–100.0)
MPV: 9.6 fL (ref 7.5–12.5)
Monocytes Relative: 11.1 %
Neutro Abs: 3005 cells/uL (ref 1500–7800)
Neutrophils Relative %: 68.3 %
Platelets: 202 10*3/uL (ref 140–400)
RBC: 4.13 10*6/uL — ABNORMAL LOW (ref 4.20–5.80)
RDW: 12.7 % (ref 11.0–15.0)
Total Lymphocyte: 18.3 %
WBC: 4.4 10*3/uL (ref 3.8–10.8)

## 2019-06-15 LAB — COMPLETE METABOLIC PANEL WITH GFR
AG Ratio: 2 (calc) (ref 1.0–2.5)
ALT: 22 U/L (ref 9–46)
AST: 21 U/L (ref 10–35)
Albumin: 4.5 g/dL (ref 3.6–5.1)
Alkaline phosphatase (APISO): 71 U/L (ref 35–144)
BUN/Creatinine Ratio: 17 (calc) (ref 6–22)
BUN: 26 mg/dL — ABNORMAL HIGH (ref 7–25)
CO2: 28 mmol/L (ref 20–32)
Calcium: 9.4 mg/dL (ref 8.6–10.3)
Chloride: 104 mmol/L (ref 98–110)
Creat: 1.52 mg/dL — ABNORMAL HIGH (ref 0.70–1.11)
GFR, Est African American: 47 mL/min/{1.73_m2} — ABNORMAL LOW (ref >=60)
GFR, Est Non African American: 40 mL/min/{1.73_m2} — ABNORMAL LOW (ref >=60)
Globulin: 2.2 g/dL (calc) (ref 1.9–3.7)
Glucose, Bld: 108 mg/dL — ABNORMAL HIGH (ref 65–99)
Potassium: 4.4 mmol/L (ref 3.5–5.3)
Sodium: 139 mmol/L (ref 135–146)
Total Bilirubin: 0.8 mg/dL (ref 0.2–1.2)
Total Protein: 6.7 g/dL (ref 6.1–8.1)

## 2019-06-15 LAB — VITAMIN D 25 HYDROXY (VIT D DEFICIENCY, FRACTURES): Vit D, 25-Hydroxy: 61 ng/mL (ref 30–100)

## 2019-06-15 LAB — LIPID PANEL
Cholesterol: 217 mg/dL — ABNORMAL HIGH (ref <200)
HDL: 77 mg/dL (ref >=40)
LDL Cholesterol (Calc): 115 mg/dL (calc) — ABNORMAL HIGH
Non-HDL Cholesterol (Calc): 140 mg/dL (calc) — ABNORMAL HIGH (ref <130)
Total CHOL/HDL Ratio: 2.8 (calc) (ref <5.0)
Triglycerides: 135 mg/dL (ref <150)

## 2019-06-15 LAB — TSH: TSH: 2.27 mIU/L (ref 0.40–4.50)

## 2019-06-15 LAB — MAGNESIUM: Magnesium: 2.3 mg/dL (ref 1.5–2.5)

## 2019-06-23 DIAGNOSIS — H33321 Round hole, right eye: Secondary | ICD-10-CM | POA: Diagnosis not present

## 2019-06-23 DIAGNOSIS — H40011 Open angle with borderline findings, low risk, right eye: Secondary | ICD-10-CM | POA: Diagnosis not present

## 2019-06-23 DIAGNOSIS — H532 Diplopia: Secondary | ICD-10-CM | POA: Diagnosis not present

## 2019-06-23 DIAGNOSIS — H353122 Nonexudative age-related macular degeneration, left eye, intermediate dry stage: Secondary | ICD-10-CM | POA: Diagnosis not present

## 2019-06-29 ENCOUNTER — Encounter: Payer: Self-pay | Admitting: Internal Medicine

## 2019-07-06 ENCOUNTER — Other Ambulatory Visit: Payer: Self-pay | Admitting: Physician Assistant

## 2019-07-06 DIAGNOSIS — I4891 Unspecified atrial fibrillation: Secondary | ICD-10-CM

## 2019-07-18 NOTE — Progress Notes (Signed)
Assessment and Plan: Eugene Friedman was seen today for follow-up.  Diagnoses and all orders for this visit:  Atrial fibrillation, unspecified type (HCC) -     diltiazem (CARDIZEM CD) 120 MG 24 hr capsule; TAKE ONE CAPSULE BY MOUTH EVERY DAY FOR BLOOD PRESSURE AND HEART RHYTHM. -     rivaroxaban (XARELTO) 20 MG TABS tablet; Take 1 tablet daily to Prevent blood Clots And Strokes -     flecainide (TAMBOCOR) 150 MG tablet; Take 1 tablet Daily   for Heart Rhythm VERY IMPORTANT FOR THE PATIENT TO GET 3 MONTHS SUPPLY FOR HIS TRIP  Other orders -     finasteride (PROSCAR) 5 MG tablet; Take 1 tablet (5 mg total) by mouth daily.  TESTING SU[facility]EST [facility]     HPI 86 y.o.male presents for follow up from last visit. He will be leaving for [place] and will need 90 day supply of medications before he goes.  Here for general check up before he goes away for 3 months, he will be leaving June 16th and will be back in Sept. Needs PCR testing 72 hours before leaving and antigen test 24 hours before leaving.  He is going from [place] to [place] and then into [place].   Meds ordered this encounter  Medications  . diltiazem (CARDIZEM CD) 120 MG 24 hr capsule    Sig: TAKE ONE CAPSULE BY MOUTH EVERY DAY FOR BLOOD PRESSURE AND HEART RHYTHM.    Dispense:  90 capsule    Refill:  1    PATIENT LEAVING FOR 3 MONTH ON June 16TH, NEEDS 90 DAY SUPPLY TO TAKE WITH HIM, PLEASE DO INSURANCE EXCEPTION, CAN SEND PAPERS    Order Specific Question:   Supervising Provider    Answer:   [name] [phone]  . rivaroxaban (XARELTO) 20 MG TABS tablet    Sig: Take 1 tablet daily to Prevent blood Clots And Strokes    Dispense:  90 tablet    Refill:  3    PATIENT LEAVING FOR 3 MONTH ON June 16TH, NEEDS 90 DAY SUPPLY TO TAKE WITH HIM, PLEASE DO INSURANCE EXCEPTION, CAN SEND PAPERS    Order Specific Question:   Supervising Provider    Answer:   [name] [phone]  . finasteride (PROSCAR) 5 MG  tablet    Sig: Take 1 tablet (5 mg total) by mouth daily.    Dispense:  90 tablet    Refill:  2    PATIENT LEAVING FOR 3 MONTH ON June 16TH, NEEDS 90 DAY SUPPLY TO TAKE WITH HIM, PLEASE DO INSURANCE EXCEPTION, CAN SEND PAPERS    Order Specific Question:   Supervising Provider    Answer:   [name] [phone]  . flecainide (TAMBOCOR) 150 MG tablet    Sig: Take 1 tablet Daily   for Heart Rhythm    Dispense:  90 tablet    Refill:  3    PATIENT LEAVING FOR 3 MONTH ON June 16TH, NEEDS 90 DAY SUPPLY TO TAKE WITH HIM, PLEASE DO INSURANCE EXCEPTION, CAN SEND PAPERS    Order Specific Question:   Supervising Provider    Answer:   [name] [phone]      Patient Active Problem List   Diagnosis Date Noted  . Persistent atrial fibrillation: CHA2DS2Vasc = 3 (Age & HTN) 11/07/2018  . AVM (arteriovenous malformation) 10/31/2018  . Spinal stenosis 02/04/2018  . Prostate cancer ([place]) 12/16/2015  . Medication management 11/14/2013  . Vitamin D deficiency 02/12/2013  .  History of elevated glucose 02/12/2013  . Elevated prostate specific antigen (PSA) 02/12/2013  . Labile hypertension 02/12/2013  . Hyperlipidemia   . Sinus bradycardia       Current Outpatient Medications (Cardiovascular):  .  diltiazem (CARDIZEM CD) 120 MG 24 hr capsule, TAKE ONE CAPSULE BY MOUTH EVERY DAY FOR BLOOD PRESSURE AND HEART RHYTHM. Marland Kitchen  doxazosin (CARDURA) 8 MG tablet, Take 1/2 to 1 tablet at Bedtime for Prostate & BP .  flecainide (TAMBOCOR) 150 MG tablet, Take 1 tablet Daily   for Heart Rhythm .  tadalafil (CIALIS) 5 MG tablet, Take 1/2 to 1 tablet Daily for Prostate    Current Outpatient Medications (Hematological):  .  rivaroxaban (XARELTO) 20 MG TABS tablet, Take 1 tablet daily to Prevent blood Clots And Strokes  Current Outpatient Medications (Other):  Marland Kitchen  Cholecalciferol (VITAMIN D-3) 5000 UNITS TABS, Take by mouth. .  dicyclomine (BENTYL) 20 MG tablet, Take 1 tablet 3 x /day before Meals  if needed  for Nausea, Cramping, Bloating or Dairrhea .  finasteride (PROSCAR) 5 MG tablet, Take 1 tablet (5 mg total) by mouth daily. .  hyoscyamine (LEVSIN SL) 0.125 MG SL tablet, DISSOLVE 1 TO 2 TABLETS UNDER THE TONGUE FOUR TIMES DAILY AS NEEDED FOR ABDOMINAL CRAMPS OR NAUSEA OR DIARRHEA .  LUTEIN PO, Take 5 mg by mouth 2 (two) times daily. Marland Kitchen  neomycin-polymyxin-hydrocortisone (CORTISPORIN) 3.5-10000-1 OTIC suspension, Place 4 drops into both ears 4 (four) times daily as needed.  Allergies  Allergen Reactions  . Celebrex [Celecoxib] Other (See Comments)    GI upset  . Lipitor [Atorvastatin]   . Ppd [Tuberculin Purified Protein Derivative] Other (See Comments)    Positive PPD  . Adhesive [Tape] Rash    Zinc in the band-aid will cause a rash  . Nickel Rash    ROS: all negative except above.   Physical Exam: There were no vitals filed for this visit. There were no vitals taken for this visit. General Appearance: Well nourished, in no apparent distress. Eyes: PERRLA, EOMs, conjunctiva no swelling or erythema Sinuses: No Frontal/maxillary tenderness ENT/Mouth: Bilateral external auditory canals inflamed without discharge, TMs without erythema, bulging. No erythema, swelling, or exudate on post pharynx.  Tonsils not swollen or erythematous. Hearing normal.  Neck: Supple, thyroid normal.  Respiratory: Respiratory effort normal, BS equal bilaterally without rales, rhonchi, wheezing or stridor.  Cardio: RRR with no MRGs. Brisk peripheral pulses without edema.  Abdomen: Soft, + BS.  Non tender, no guarding, rebound, hernias, masses. Lymphatics: Non tender without lymphadenopathy.  Musculoskeletal: Full ROM, 5/5 strength, slow antalgic gait Skin: Warm, dry without rashes, lesions, ecchymosis.  Neuro: Cranial nerves intact. No cerebellar symptoms.  Psych: Awake and oriented X 3, normal affect, Insight and Judgment appropriate.   Vicie Mutters, PA-C 10:56 AM [facility]

## 2019-07-19 ENCOUNTER — Ambulatory Visit (INDEPENDENT_AMBULATORY_CARE_PROVIDER_SITE_OTHER): Payer: Medicare Other | Admitting: Physician Assistant

## 2019-07-19 ENCOUNTER — Other Ambulatory Visit: Payer: Self-pay

## 2019-07-19 ENCOUNTER — Encounter: Payer: Self-pay | Admitting: Physician Assistant

## 2019-07-19 DIAGNOSIS — I4891 Unspecified atrial fibrillation: Secondary | ICD-10-CM | POA: Diagnosis not present

## 2019-07-19 MED ORDER — RIVAROXABAN 20 MG PO TABS: ORAL_TABLET | ORAL | 3 refills | Status: DC

## 2019-07-19 MED ORDER — FLECAINIDE ACETATE 150 MG PO TABS: ORAL_TABLET | ORAL | 3 refills | Status: DC

## 2019-07-19 MED ORDER — FINASTERIDE 5 MG PO TABS: 5.0000 mg | ORAL_TABLET | Freq: Every day | ORAL | 2 refills | Status: DC

## 2019-07-19 MED ORDER — DILTIAZEM HCL ER COATED BEADS 120 MG PO CP24: ORAL_CAPSULE | ORAL | 1 refills | Status: DC

## 2019-07-19 NOTE — Patient Instructions (Addendum)
I HAVE SENT IN 3 MONTH REFILLS FOR YOU TO GET WE MAY HAVE TO SEND A LETTER TO YOUR INSURANCE COMPANY AUTHORIZING FOR YOU TO GET YOUR MEDICATIONS EARLY WE WILL LET YOU KNOW IF AND WHEN WE GET THOSE PAPERS.   I WILL CHECK INTO GETTING A 72 HOUR TEST FOR YOU HERE AT OUR OFFICE BEFORE YOUR TRIP  [facility] TO SET UP A RAPID ANTIGEN TEST ON Tuesday OR Wednesday DAY BEFORE YOUR TRIP 24 HOUR RAPID TEST- CHECK TO SEE IF 45 MIN RESULTS

## 2019-07-24 ENCOUNTER — Telehealth: Payer: Self-pay

## 2019-07-24 NOTE — Telephone Encounter (Signed)
-----   Message from Vicie Mutters, [place] sent at 07/24/2019  9:02 AM EDT ----- We will not be able to set up the 72 hour PCR testing at the office like I was hoping but you should be able to [facility] as [facility] are with the antigen testing.  I sent a my chart message but can you call as well? Thanks [employer]

## 2019-07-24 NOTE — Telephone Encounter (Signed)
Patient aware of message 6.7.2021

## 2019-07-31 ENCOUNTER — Other Ambulatory Visit: Payer: Self-pay | Admitting: Internal Medicine

## 2019-07-31 DIAGNOSIS — N138 Other obstructive and reflux uropathy: Secondary | ICD-10-CM

## 2019-07-31 MED ORDER — TADALAFIL 5 MG PO TABS: ORAL_TABLET | ORAL | 3 refills | Status: DC

## 2019-11-07 ENCOUNTER — Ambulatory Visit: Payer: Medicare Other | Attending: Critical Care Medicine

## 2019-11-07 DIAGNOSIS — Z23 Encounter for immunization: Secondary | ICD-10-CM

## 2019-11-07 NOTE — Progress Notes (Signed)
   Covid-19 Vaccination Clinic  Name:  RENDELL THIVIERGE    MRN: [id number] DOB: December 03, 1928  11/07/2019  Mr. Basquez was observed post Covid-19 immunization for 15 minutes without incident. He was provided with Vaccine Information Sheet and instruction to access the V-Safe system.   Mr. Espin was instructed to call 911 with any severe reactions post vaccine: Marland Kitchen Difficulty breathing  . Swelling of face and throat  . A fast heartbeat  . A bad rash all over body  . Dizziness and weakness

## 2019-11-14 DIAGNOSIS — C61 Malignant neoplasm of prostate: Secondary | ICD-10-CM | POA: Diagnosis not present

## 2019-11-20 DIAGNOSIS — R3912 Poor urinary stream: Secondary | ICD-10-CM | POA: Diagnosis not present

## 2019-11-20 DIAGNOSIS — N401 Enlarged prostate with lower urinary tract symptoms: Secondary | ICD-10-CM | POA: Diagnosis not present

## 2019-11-20 DIAGNOSIS — C61 Malignant neoplasm of prostate: Secondary | ICD-10-CM | POA: Diagnosis not present

## 2019-11-22 ENCOUNTER — Encounter: Payer: Self-pay | Admitting: Internal Medicine

## 2019-11-22 NOTE — Progress Notes (Signed)
Annual  Screening/Preventative Visit  & Comprehensive Evaluation & Examination     This very nice 86 y.o.  Eugene (Dane)  presents for a  comprehensive evaluation and management of multiple medical co-morbidities.  Patient has been followed for HTN, HLD, Prediabetes and Vitamin D Deficiency.     Patient has hx/o Gleason 6 Prostate Ca followed by active surveillance by Dr Gerald Stabs Lovena Neighbours.       Patient has hx/o HTN circa 2001. In 2011, he had a negative Cardiolite. In 2017, he had a Negative ETT. In Sept, 2020  He was discovered in  Afib and was started on Xarelto. (CHADsVASc = 3). Patient failed CV x 2 in Oct & Dec, 2020 at [place]. Patient denies any cardiac symptoms as chest pain, palpitations, shortness of breath, dizziness or ankle swelling.      Patient's hyperlipidemia is not controlled with diet and medications. Patient denies myalgias or other medication SE's. Last lipids were not at goal:  Lab Results  Component Value Date   CHOL 217 (H) 06/14/2019   HDL 77 06/14/2019   LDLCALC 115 (H) 06/14/2019   TRIG 135 06/14/2019   CHOLHDL 2.8 06/14/2019       Patient has hx/o prediabetes (A1c 6.1% /2014) and patient denies reactive hypoglycemic symptoms, visual blurring, diabetic polys or paresthesias. Last A1c was Normal & at goal:  Lab Results  Component Value Date   HGBA1C 5.0 04/27/2019        Finally, patient has history of Vitamin D Deficiency ("42" /2009) and last vitamin D was at goal:   Current Outpatient Medications on File Prior to Visit  Medication Sig  . Cholecalciferol (VITAMIN D-3) 5000 UNITS TABS Take by mouth.  . dicyclomine (BENTYL) 20 MG tablet Take 1 tablet 3 x /day before Meals  if needed for Nausea, Cramping, Bloating or Dairrhea  . diltiazem (CARDIZEM CD) 120 MG 24 hr capsule TAKE ONE CAPSULE BY MOUTH EVERY DAY FOR BLOOD PRESSURE AND HEART RHYTHM.  Marland Kitchen doxazosin (CARDURA) 8 MG tablet Take 1/2 to 1 tablet at Bedtime for Prostate & BP  . finasteride (PROSCAR) 5 MG  tablet Take 1 tablet (5 mg total) by mouth daily.  . flecainide (TAMBOCOR) 150 MG tablet Take 1 tablet Daily   for Heart Rhythm  . hyoscyamine (LEVSIN SL) 0.125 MG SL tablet DISSOLVE 1 TO 2 TABLETS UNDER THE TONGUE FOUR TIMES DAILY AS NEEDED FOR ABDOMINAL CRAMPS OR NAUSEA OR DIARRHEA  . LUTEIN PO Take 5 mg by mouth 2 (two) times daily.  Marland Kitchen neomycin-polymyxin-hydrocortisone (CORTISPORIN) 3.5-10000-1 OTIC suspension Place 4 drops into both ears 4 (four) times daily as needed.  . rivaroxaban (XARELTO) 20 MG TABS tablet Take 1 tablet daily to Prevent blood Clots And Strokes  . tadalafil (CIALIS) 5 MG tablet Take 1 tablet Daily for Prostate   No current facility-administered medications on file prior to visit.   Allergies  Allergen Reactions  . Celebrex [Celecoxib] Other (See Comments)    GI upset  . Lipitor [Atorvastatin]   . Ppd [Tuberculin Purified Protein Derivative] Other (See Comments)    Positive PPD  . Adhesive [Tape] Rash    Zinc in the band-aid will cause a rash  . Nickel Rash   Past Medical History:  Diagnosis Date  . Allergic rhinitis   . Atrial fibrillation by electrocardiogram ([place]) 10/2018   Was started on calcium channel blocker, beta-blocker and Xarelto  . Cancer [facility])    prostate  . DJD (degenerative joint disease)   .  Hypertension    Borderline   . Sinus bradycardia   . Spinal stenosis    Complicated by dural AVM  . Vitamin D deficiency    Health Maintenance  Topic Date Due  . INFLUENZA VACCINE  09/17/2019  . TETANUS/TDAP  06/13/2029  . COVID-19 Vaccine  Completed  . PNA vac Low Risk Adult  Completed   Immunization History  Administered Date(s) Administered  . H1N1 02/17/2007  . Influenza Whole 12/05/2012  . Influenza, High Dose Seasonal PF 11/14/2013, 11/20/2014, 12/16/2015, 10/30/2016, 02/04/2018, 11/22/2018  . Influenza-Unspecified 12/05/2012  . PFIZER SARS-COV-2 Vaccination 03/09/2019, 03/30/2019, 11/07/2019  . Pneumococcal Conjugate-13 12/05/2014   . Pneumococcal Polysaccharide-23 03/08/2008, 05/18/2013  . Td 06/14/2019  . Tdap 02/17/2008  . Zoster 02/16/2005   Last Colon -   Past Surgical History:  Procedure Laterality Date  . APPENDECTOMY    . Back Procedure  11/02/2018   [facility] -vascular neuroradiology occlusion of dural AVM  . cervical neck mass   07/08/06   Left Excision left posterior cervical neck mass (2.5 cm). SURGEON:  Earnstine Regal, M.D., Allegra Grana 07/08/06 and 12/05/07  . FLEXIBLE SIGMOIDOSCOPY    . HEMORRHOID SURGERY    . JOINT REPLACEMENT     Left knee 2013  . KNEE SURGERY    . MASTOIDECTOMY Left    Family History  Problem Relation Age of Onset  . Stroke Mother   . Diabetes Mother   . Heart attack Father 20  . Hypertension Father   . Diabetes Maternal Aunt   . Diabetes Paternal Aunt   . Cancer Maternal Grandmother    Social History   Socioeconomic History  . Marital status: Widowed  . Number of children: 2 daughters - 2 grandchildren  Occupational History    Retired Software engineer /CEO of[employer]  Tobacco Use  . Smoking status: Never Smoker  . Smokeless tobacco: Never Used  Vaping Use  . Vaping Use: Never used  Substance and Sexual Activity  . Alcohol use: Yes    Alcohol/week: 10.0 standard drinks    Types: 10 Glasses of wine per week  . Drug use: No  . Sexual activity: Not on file  Social History Narrative   Left handed   Caffeine 1.5 cups daily    Lives at home alone Wife passed.    Accompanied by his daughter.   Native [place] -> speaks [place] well     ROS Constitutional: Denies fever, chills, weight loss/gain, headaches, insomnia,  night sweats or change in appetite. Does c/o fatigue. Eyes: Denies redness, blurred vision, diplopia, discharge, itchy or watery eyes.  ENT: Denies discharge, congestion, post nasal drip, epistaxis, sore throat, earache, hearing loss, dental pain, Tinnitus, Vertigo, Sinus pain or snoring.  Cardio: Denies chest pain, palpitations, irregular  heartbeat, syncope, dyspnea, diaphoresis, orthopnea, PND, claudication or edema Respiratory: denies cough, dyspnea, DOE, pleurisy, hoarseness, laryngitis or wheezing.  Gastrointestinal: Denies dysphagia, heartburn, reflux, water brash, pain, cramps, nausea, vomiting, bloating, diarrhea, constipation, hematemesis, melena, hematochezia, jaundice or hemorrhoids Genitourinary: Denies dysuria, frequency, urgency, nocturia, hesitancy, discharge, hematuria or flank pain Musculoskeletal: Denies arthralgia, myalgia, stiffness, Jt. Swelling, pain, limp or strain/sprain. Denies Falls. Skin: Denies puritis, rash, hives, warts, acne, eczema or change in skin lesion Neuro: No weakness, tremor, incoordination, spasms, paresthesia or pain Psychiatric: Denies confusion, memory loss or sensory loss. Denies Depression. Endocrine: Denies change in weight, skin, hair change, nocturia, and paresthesia, diabetic polys, visual blurring or hyper / hypo glycemic episodes.  Heme/Lymph: No excessive bleeding, bruising or  enlarged lymph nodes.  Physical Exam  There were no vitals taken for this visit.  General Appearance: Well nourished and well groomed and in no apparent distress.  Eyes: PERRLA, EOMs, conjunctiva no swelling or erythema, normal fundi and vessels. Sinuses: No frontal/maxillary tenderness ENT/Mouth: EACs patent / TMs  nl. Nares clear without erythema, swelling, mucoid exudates. Oral hygiene is good. No erythema, swelling, or exudate. Tongue normal, non-obstructing. Tonsils not swollen or erythematous. Hearing normal.  Neck: Supple, thyroid not palpable. No bruits, nodes or JVD. Respiratory: Respiratory effort normal.  BS equal and clear bilateral without rales, rhonci, wheezing or stridor. Cardio: Heart sounds are normal with regular rate and rhythm and no murmurs, rubs or gallops. Peripheral pulses are normal and equal bilaterally without edema. No aortic or femoral bruits. Chest: symmetric with normal  excursions and percussion.  Abdomen: Soft, with Nl bowel sounds. Nontender, no guarding, rebound, hernias, masses, or organomegaly.  Lymphatics: Non tender without lymphadenopathy.  Musculoskeletal: Full ROM all peripheral extremities, joint stability, 5/5 strength, and normal gait. Skin: Warm and dry without rashes, lesions, cyanosis, clubbing or  ecchymosis.  Neuro: Cranial nerves intact, reflexes equal bilaterally. Normal muscle tone, no cerebellar symptoms. Sensation intact.  Pysch: Alert and oriented X 3 with normal affect, insight and judgment appropriate.   Assessment and Plan  1. Essential hypertension  - EKG 12-Lead - [place], RETROPERITNL ABD,  LTD - Urinalysis, Routine w reflex microscopic - Microalbumin / creatinine urine ratio - CBC with Differential/Platelet - COMPLETE METABOLIC PANEL WITH GFR - Magnesium - TSH  2. Hyperlipidemia, mixed  - EKG 12-Lead - [place], RETROPERITNL ABD,  LTD - Lipid panel - TSH  3. Abnormal glucose  - EKG 12-Lead - [place], RETROPERITNL ABD,  LTD - Hemoglobin A1c - Insulin, random  4. Vitamin D deficiency  - VITAMIN D 25 Hydroxy  5. Parox atrial fibrillation   - EKG 12-Lead - TSH  6. Screening for ischemic heart disease  - EKG 12-Lead - [place], RETROPERITNL ABD,  LTD  7. FHx: heart disease  - EKG 12-Lead - [place], RETROPERITNL ABD,  LTD  8. Screening for AAA (aortic abdominal aneurysm)  - [place], RETROPERITNL ABD,  LTD  9. Medication management  - Urinalysis, Routine w reflex microscopic - Microalbumin / creatinine urine ratio - CBC with Differential/Platelet - COMPLETE METABOLIC PANEL WITH GFR - Magnesium - Lipid panel - TSH - Hemoglobin A1c - Insulin, random - VITAMIN D 25 Hydroxy  10. Screening for colorectal cancer  - POC Hemoccult Bld/Stl (3-Cd Home Screen); Future  11. Need for immunization against influenza  - Flu vaccine HIGH DOSE PF (Fluzone High dose)         Patient was counseled in prudent diet, weight control  to achieve/maintain BMI less than 25, BP monitoring, regular exercise and medications as discussed.  Discussed med effects and SE's. Routine screening labs and tests as requested with regular follow-up as recommended. Over 40 minutes of exam, counseling, chart review and high complex critical decision making was performed   Kirtland Bouchard, MD

## 2019-11-22 NOTE — Patient Instructions (Signed)

## 2019-11-23 ENCOUNTER — Ambulatory Visit (INDEPENDENT_AMBULATORY_CARE_PROVIDER_SITE_OTHER): Payer: Medicare Other | Admitting: Internal Medicine

## 2019-11-23 ENCOUNTER — Other Ambulatory Visit: Payer: Self-pay

## 2019-11-23 VITALS — BP 148/72 | HR 64 | Temp 96.7°F | Resp 16 | Ht 73.25 in | Wt 194.4 lb

## 2019-11-23 DIAGNOSIS — R7309 Other abnormal glucose: Secondary | ICD-10-CM | POA: Diagnosis not present

## 2019-11-23 DIAGNOSIS — Z23 Encounter for immunization: Secondary | ICD-10-CM

## 2019-11-23 DIAGNOSIS — Z8249 Family history of ischemic heart disease and other diseases of the circulatory system: Secondary | ICD-10-CM

## 2019-11-23 DIAGNOSIS — E782 Mixed hyperlipidemia: Secondary | ICD-10-CM | POA: Diagnosis not present

## 2019-11-23 DIAGNOSIS — I4819 Other persistent atrial fibrillation: Secondary | ICD-10-CM | POA: Diagnosis not present

## 2019-11-23 DIAGNOSIS — Z136 Encounter for screening for cardiovascular disorders: Secondary | ICD-10-CM | POA: Diagnosis not present

## 2019-11-23 DIAGNOSIS — E559 Vitamin D deficiency, unspecified: Secondary | ICD-10-CM

## 2019-11-23 DIAGNOSIS — Z79899 Other long term (current) drug therapy: Secondary | ICD-10-CM | POA: Diagnosis not present

## 2019-11-23 DIAGNOSIS — I10 Essential (primary) hypertension: Secondary | ICD-10-CM

## 2019-11-23 DIAGNOSIS — Z1212 Encounter for screening for malignant neoplasm of rectum: Secondary | ICD-10-CM

## 2019-11-24 LAB — URINALYSIS, ROUTINE W REFLEX MICROSCOPIC
Bacteria, UA: NONE SEEN /HPF
Bilirubin Urine: NEGATIVE
Glucose, UA: NEGATIVE
Hgb urine dipstick: NEGATIVE
Hyaline Cast: NONE SEEN /LPF
Ketones, ur: NEGATIVE
Leukocytes,Ua: NEGATIVE
Nitrite: NEGATIVE
Protein, ur: NEGATIVE
Specific Gravity, Urine: 1.011 (ref 1.001–1.03)
Squamous Epithelial / HPF: NONE SEEN /HPF (ref <=5)
pH: 7 (ref 5.0–8.0)

## 2019-11-24 LAB — COMPLETE METABOLIC PANEL WITH GFR
AG Ratio: 2 (calc) (ref 1.0–2.5)
ALT: 17 U/L (ref 9–46)
AST: 18 U/L (ref 10–35)
Albumin: 4.5 g/dL (ref 3.6–5.1)
Alkaline phosphatase (APISO): 77 U/L (ref 35–144)
BUN/Creatinine Ratio: 16 (calc) (ref 6–22)
BUN: 25 mg/dL (ref 7–25)
CO2: 27 mmol/L (ref 20–32)
Calcium: 9.3 mg/dL (ref 8.6–10.3)
Chloride: 102 mmol/L (ref 98–110)
Creat: 1.6 mg/dL — ABNORMAL HIGH (ref 0.70–1.11)
GFR, Est African American: 44 mL/min/{1.73_m2} — ABNORMAL LOW (ref >=60)
GFR, Est Non African American: 38 mL/min/{1.73_m2} — ABNORMAL LOW (ref >=60)
Globulin: 2.3 g/dL (calc) (ref 1.9–3.7)
Glucose, Bld: 94 mg/dL (ref 65–99)
Potassium: 4.8 mmol/L (ref 3.5–5.3)
Sodium: 138 mmol/L (ref 135–146)
Total Bilirubin: 0.6 mg/dL (ref 0.2–1.2)
Total Protein: 6.8 g/dL (ref 6.1–8.1)

## 2019-11-24 LAB — HEMOGLOBIN A1C
Hgb A1c MFr Bld: 5 % of total Hgb (ref <5.7)
Mean Plasma Glucose: 97 (calc)
eAG (mmol/L): 5.4 (calc)

## 2019-11-24 LAB — CBC WITH DIFFERENTIAL/PLATELET
Absolute Monocytes: 578 cells/uL (ref 200–950)
Basophils Absolute: 39 cells/uL (ref 0–200)
Basophils Relative: 0.8 %
Eosinophils Absolute: 93 cells/uL (ref 15–500)
Eosinophils Relative: 1.9 %
HCT: 40.4 % (ref 38.5–50.0)
Hemoglobin: 13.2 g/dL (ref 13.2–17.1)
Lymphs Abs: 1083 cells/uL (ref 850–3900)
MCH: 30.6 pg (ref 27.0–33.0)
MCHC: 32.7 g/dL (ref 32.0–36.0)
MCV: 93.7 fL (ref 80.0–100.0)
MPV: 9.6 fL (ref 7.5–12.5)
Monocytes Relative: 11.8 %
Neutro Abs: 3107 cells/uL (ref 1500–7800)
Neutrophils Relative %: 63.4 %
Platelets: 192 10*3/uL (ref 140–400)
RBC: 4.31 10*6/uL (ref 4.20–5.80)
RDW: 12.3 % (ref 11.0–15.0)
Total Lymphocyte: 22.1 %
WBC: 4.9 10*3/uL (ref 3.8–10.8)

## 2019-11-24 LAB — VITAMIN D 25 HYDROXY (VIT D DEFICIENCY, FRACTURES): Vit D, 25-Hydroxy: 66 ng/mL (ref 30–100)

## 2019-11-24 LAB — MICROALBUMIN / CREATININE URINE RATIO
Creatinine, Urine: 85 mg/dL (ref 20–320)
Microalb Creat Ratio: 36 mcg/mg creat — ABNORMAL HIGH (ref <30)
Microalb, Ur: 3.1 mg/dL

## 2019-11-24 LAB — INSULIN, RANDOM: Insulin: 6.5 u[IU]/mL

## 2019-11-24 LAB — LIPID PANEL
Cholesterol: 227 mg/dL — ABNORMAL HIGH (ref <200)
HDL: 95 mg/dL (ref >=40)
LDL Cholesterol (Calc): 111 mg/dL (calc) — ABNORMAL HIGH
Non-HDL Cholesterol (Calc): 132 mg/dL (calc) — ABNORMAL HIGH (ref <130)
Total CHOL/HDL Ratio: 2.4 (calc) (ref <5.0)
Triglycerides: 107 mg/dL (ref <150)

## 2019-11-24 LAB — TSH: TSH: 3.09 mIU/L (ref 0.40–4.50)

## 2019-11-24 LAB — MAGNESIUM: Magnesium: 2.3 mg/dL (ref 1.5–2.5)

## 2019-11-25 NOTE — Progress Notes (Signed)
==========================================================  -   Kidney functions (Creatinine and GFR) are a little decreased (Stage 3b)  - Very important to drink more Water, Fluids   - Recommend drink at least 6 bottles (16 oz) equivalent of Fluids /day  ==========================================================  - Total Chol = 227 - Elevated  - Recommend a stricter low cholesterol diet   - Cholesterol only comes from animal sources  - ie. meat, dairy, egg yolks  - Eat all the vegetables you want.  - Avoid meat, especially red meat - Beef AND Pork .  - Avoid cheese & dairy - milk & ice cream.    - Cheese is the most concentrated form of trans-fats which  is the worst thing to clog up our arteries.   - Veggie cheese is OK which can be found in the fresh  produce section at [employer] or [employer] or [employer] ==========================================================  - A1c - Normal - Great - No Diabetes ! ==========================================================  - Vitamin D = 66 - Excellent ! ==========================================================  - All Else - CBC - Kidneys - Electrolytes - Liver - Magnesium & Thyroid   - all Normal / OK ==========================================================

## 2019-11-28 DIAGNOSIS — L820 Inflamed seborrheic keratosis: Secondary | ICD-10-CM | POA: Diagnosis not present

## 2019-11-28 DIAGNOSIS — L812 Freckles: Secondary | ICD-10-CM | POA: Diagnosis not present

## 2019-11-28 DIAGNOSIS — L821 Other seborrheic keratosis: Secondary | ICD-10-CM | POA: Diagnosis not present

## 2019-11-28 DIAGNOSIS — D1801 Hemangioma of skin and subcutaneous tissue: Secondary | ICD-10-CM | POA: Diagnosis not present

## 2019-11-28 DIAGNOSIS — L570 Actinic keratosis: Secondary | ICD-10-CM | POA: Diagnosis not present

## 2019-11-29 ENCOUNTER — Other Ambulatory Visit: Payer: Self-pay | Admitting: Internal Medicine

## 2019-11-29 MED ORDER — FLECAINIDE ACETATE 100 MG PO TABS: ORAL_TABLET | ORAL | 1 refills | Status: DC

## 2019-12-04 ENCOUNTER — Other Ambulatory Visit: Payer: Self-pay | Admitting: Internal Medicine

## 2019-12-04 DIAGNOSIS — K582 Mixed irritable bowel syndrome: Secondary | ICD-10-CM

## 2019-12-29 DIAGNOSIS — C61 Malignant neoplasm of prostate: Secondary | ICD-10-CM | POA: Diagnosis not present

## 2019-12-29 DIAGNOSIS — R3912 Poor urinary stream: Secondary | ICD-10-CM | POA: Diagnosis not present

## 2019-12-29 DIAGNOSIS — N401 Enlarged prostate with lower urinary tract symptoms: Secondary | ICD-10-CM | POA: Diagnosis not present

## 2020-01-09 DIAGNOSIS — H40011 Open angle with borderline findings, low risk, right eye: Secondary | ICD-10-CM | POA: Diagnosis not present

## 2020-01-09 DIAGNOSIS — H353122 Nonexudative age-related macular degeneration, left eye, intermediate dry stage: Secondary | ICD-10-CM | POA: Diagnosis not present

## 2020-01-09 DIAGNOSIS — H33321 Round hole, right eye: Secondary | ICD-10-CM | POA: Diagnosis not present

## 2020-01-15 DIAGNOSIS — I4819 Other persistent atrial fibrillation: Secondary | ICD-10-CM | POA: Diagnosis not present

## 2020-01-15 DIAGNOSIS — R001 Bradycardia, unspecified: Secondary | ICD-10-CM | POA: Diagnosis not present

## 2020-02-26 NOTE — Progress Notes (Signed)
3 MONTH FOLLOW UP  Assessment:    Labile hypertension -     CBC with Differential/Platelet -     COMPLETE METABOLIC PANEL WITH GFR -     TSH - continue medications, DASH diet, exercise and monitor at home. Call if greater than 130/80.   Mixed hyperlipidemia check lipids; off of med due to mild elevations, age, otherwise low risk hx decrease fatty foods increase activity.  -     Lipid panel  Prostate cancer ([place]) Continue to monitor  Persistent atrial fibrillation: CHA2DS2Vasc = 3 (Age & HTN) Continue medications and cardio follow up NSR today  History of elevated glucose Monitor  Medication management -     Magnesium  Vitamin D deficiency At goal at recent check; continue to recommend supplementation for goal of 60-100 Defer vitamin D level  AVM (arteriovenous malformation) Monitor  Spinal stenosis, unspecified spinal region Continue PT  Sinus bradycardia Monitor  Atrial fibrillation, unspecified type (HCC) Rate controlled; continue xarelto; cardiology following -     diltiazem (CARDIZEM CD) 120 MG 24 hr capsule; Take 1 capsule Daily for BP & Heart Rhythm  CKD IIIb (HCC) Increase fluids, avoid NSAIDS, monitor sugars, will monitor - CMP/GFR  R finger laceration Well approximated by adhesive bandage No appearance of infection; monitor until resolution, follow up PRN Did have tetanus booster last year  Over 30 minutes of exam, counseling, chart review, and critical decision making was performed  Future Appointments  Date Time Provider [place]  05/27/2020  2:30 PM [name], MD [facility] None  12/09/2020  3:00 PM [name], MD [facility] None     Subjective:  Eugene Friedman is a 87 y.o. [place] male who presents for 3 month follow up for HTN, hyperlipidemia, prediabetes, and vitamin D Def.   He fell on boat this past weekend, sliced R 5th digit, applied steri strips, no signs of infection. Had tetanus booster 05/2019.   He is  following with Dr. 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 Did PT, continues with balance exercises at home, uses walking stick outside of the home, continues to follow up for surveillance.  Follows with Dr. Lovena Neighbours for history of prostate cancer x 2007, active surveillance.    BMI is Body mass index is 25.29 kg/m., he is working on diet and exercise. Wt Readings from Last 3 Encounters:  02/27/20 193 lb (87.5 kg)  11/23/19 194 lb 6.4 oz (88.2 kg)  07/19/19 194 lb (88 kg)   In 2011, he had a negative Cardiolite. In 2017, he had a Negative ETT. In Sept, 2020  He was discovered in  Afib and was started on Xarelto. (CHADsVASc = 3). Patient failed CV x 2 in Oct & Dec, 2020 at [place]. Had follow up 12/2019. He is on diltiazem 120 mg daily.   His blood pressure has been controlled at home at home are under 120/60's, today their BP is BP: 128/70   He does workout. He denies chest pain, shortness of breath, dizziness.   He is not on cholesterol medication, was discontinued due to low risk hx of age. His cholesterol is not at goal. The cholesterol last visit was:   Lab Results  Component Value Date   CHOL 227 (H) 11/23/2019   HDL 95 11/23/2019   LDLCALC 111 (H) 11/23/2019   TRIG 107 11/23/2019   CHOLHDL 2.4 11/23/2019    He has been working on diet and exercise  for glucose management, and denies paresthesia of the feet, polydipsia, polyuria and visual disturbances. Last A1C in the office was:  Lab Results  Component Value Date   HGBA1C 5.0 11/23/2019   He repeorts drinks decaf tea and coffee in AM, admits to minimal water intake. He Last GFR Lab Results  Component Value Date   GFRNONAA 38 (L) 11/23/2019   GFRNONAA 40 (L) 06/14/2019   GFRNONAA 43 (L) 04/27/2019   Patient is on Vitamin D supplement.   Lab Results  Component Value Date   VD25OH 66 11/23/2019      Medication Review:   Current Outpatient Medications (Cardiovascular):     diltiazem (CARDIZEM CD) 120 MG 24 hr capsule, TAKE ONE CAPSULE BY MOUTH EVERY DAY FOR BLOOD PRESSURE AND HEART RHYTHM.   flecainide (TAMBOCOR) 100 MG tablet, Take     1 tablet     2 x /day  (every 12 hours )        for Afib   doxazosin (CARDURA) 8 MG tablet, Take 1/2 to 1 tablet at Bedtime for Prostate & BP (Patient not taking: Reported on 02/27/2020)   tadalafil (CIALIS) 5 MG tablet, Take 1 tablet Daily for Prostate (Patient not taking: No sig reported)    Current Outpatient Medications (Hematological):    rivaroxaban (XARELTO) 20 MG TABS tablet, Take 1 tablet daily to Prevent blood Clots And Strokes  Current Outpatient Medications (Other):    Cholecalciferol (VITAMIN D-3) 5000 UNITS TABS, Take by mouth.   dicyclomine (BENTYL) 20 MG tablet, Take     1 tablet      3 x /day       as needed for Nausea - bloating -cramping or diarrhea   finasteride (PROSCAR) 5 MG tablet, Take 1 tablet (5 mg total) by mouth daily.   hyoscyamine (LEVSIN SL) 0.125 MG SL tablet, DISSOLVE 1 TO 2 TABLETS UNDER THE TONGUE FOUR TIMES DAILY AS NEEDED FOR ABDOMINAL CRAMPS OR NAUSEA OR DIARRHEA   LUTEIN PO, Take 5 mg by mouth 2 (two) times daily.   neomycin-polymyxin-hydrocortisone (CORTISPORIN) 3.5-10000-1 OTIC suspension, Place 4 drops into both ears 4 (four) times daily as needed.  Allergies: Allergies  Allergen Reactions   Celebrex [Celecoxib] Other (See Comments)    GI upset   Lipitor [Atorvastatin]    Ppd [Tuberculin Purified Protein Derivative] Other (See Comments)    Positive PPD   Adhesive [Tape] Rash    Zinc in the band-aid will cause a rash   Nickel Rash    Current Problems (verified) has Hyperlipidemia; Sinus bradycardia; Vitamin D deficiency; History of elevated glucose; Elevated prostate specific antigen (PSA); Labile hypertension; Medication management; Prostate cancer ([place]); Spinal stenosis; Persistent atrial fibrillation: CHA2DS2Vasc = 3 (Age & HTN); and AVM (arteriovenous  malformation) on their problem list.   Surgical: He  has a past surgical history that includes cervical neck mass  (07/08/06); Appendectomy; Mastoidectomy (Left); Flexible sigmoidoscopy; Knee surgery; Hemorrhoid surgery; Joint replacement; and Back Procedure (11/02/2018). Family His family history includes Cancer in his maternal grandmother; Diabetes in his maternal aunt, mother, and paternal aunt; Heart attack (age of onset: 61) in his father; Hypertension in his father; Stroke in his mother. Social history  He reports that he has never smoked. He has never used smokeless tobacco. He reports current alcohol use of about 10.0 standard drinks of alcohol per week. He reports that he does not use drugs.  Review of Systems  Constitutional: Negative for malaise/fatigue and weight loss.  HENT: Negative for hearing loss  and tinnitus.   Eyes: Negative for blurred vision and double vision.  Respiratory: Negative for cough, shortness of breath and wheezing.   Cardiovascular: Negative for chest pain, palpitations, orthopnea, claudication and leg swelling.  Gastrointestinal: Negative for abdominal pain, blood in stool, constipation, diarrhea, heartburn, melena, nausea and vomiting.  Genitourinary: Negative.   Musculoskeletal: Negative for joint pain and myalgias.  Skin: Negative for rash.  Neurological: Negative for dizziness, tingling, sensory change, weakness and headaches.  Endo/Heme/Allergies: Negative for polydipsia.  Psychiatric/Behavioral: Negative.   All other systems reviewed and are negative.    Objective:   Today's Vitals   02/27/20 0943  BP: 128/70  Pulse: 67  Temp: (!) 97 F (36.1 C)  SpO2: 98%  Weight: 193 lb (87.5 kg)   Body mass index is 25.29 kg/m.  General appearance: alert, no distress, WD/WN, male HEENT: normocephalic, sclerae anicteric, TMs pearly, nares patent, no discharge or erythema, pharynx normal Oral cavity: MMM, no lesions Neck: supple, no lymphadenopathy,  no thyromegaly, no masses Heart: RRR, normal S1, S2, no murmurs Lungs: CTA bilaterally, no wheezes, rhonchi, or rales Abdomen: +bs, soft, non tender, non distended, no masses, no hepatomegaly, no splenomegaly Musculoskeletal: nontender, no swelling, no obvious deformity Extremities: no edema, no cyanosis, no clubbing Pulses: 2+ symmetric, upper and lower extremities, normal cap refill Neurological: alert, oriented x 3, CN2-12 intact, strength normal upper extremities and lower extremities, sensation normal throughout, DTRs 2+ throughout, no cerebellar signs, gait slow, stead Psychiatric: normal affect, behavior normal, pleasant  Skin: R hand 5th digit with approximated wound with steri strips to pad; no erythema or discharge    [name], NP   02/27/2020

## 2020-02-27 ENCOUNTER — Other Ambulatory Visit: Payer: Self-pay

## 2020-02-27 ENCOUNTER — Ambulatory Visit (INDEPENDENT_AMBULATORY_CARE_PROVIDER_SITE_OTHER): Payer: Medicare Other | Admitting: Adult Health

## 2020-02-27 ENCOUNTER — Encounter: Payer: Self-pay | Admitting: Adult Health

## 2020-02-27 VITALS — BP 128/70 | HR 67 | Temp 97.0°F | Wt 193.0 lb

## 2020-02-27 DIAGNOSIS — C61 Malignant neoplasm of prostate: Secondary | ICD-10-CM

## 2020-02-27 DIAGNOSIS — E559 Vitamin D deficiency, unspecified: Secondary | ICD-10-CM

## 2020-02-27 DIAGNOSIS — R0989 Other specified symptoms and signs involving the circulatory and respiratory systems: Secondary | ICD-10-CM

## 2020-02-27 DIAGNOSIS — E782 Mixed hyperlipidemia: Secondary | ICD-10-CM

## 2020-02-27 DIAGNOSIS — Z79899 Other long term (current) drug therapy: Secondary | ICD-10-CM

## 2020-02-27 DIAGNOSIS — I4819 Other persistent atrial fibrillation: Secondary | ICD-10-CM

## 2020-02-27 DIAGNOSIS — N1832 Chronic kidney disease, stage 3b: Secondary | ICD-10-CM | POA: Insufficient documentation

## 2020-02-27 DIAGNOSIS — Z8639 Personal history of other endocrine, nutritional and metabolic disease: Secondary | ICD-10-CM

## 2020-02-27 DIAGNOSIS — Z6825 Body mass index (BMI) 25.0-25.9, adult: Secondary | ICD-10-CM

## 2020-02-27 LAB — CBC WITH DIFFERENTIAL/PLATELET
Absolute Monocytes: 607 cells/uL (ref 200–950)
Basophils Absolute: 32 cells/uL (ref 0–200)
Basophils Relative: 0.7 %
Eosinophils Absolute: 110 cells/uL (ref 15–500)
Eosinophils Relative: 2.4 %
HCT: 38.6 % (ref 38.5–50.0)
Hemoglobin: 13 g/dL — ABNORMAL LOW (ref 13.2–17.1)
Lymphs Abs: 957 cells/uL (ref 850–3900)
MCH: 31.3 pg (ref 27.0–33.0)
MCHC: 33.7 g/dL (ref 32.0–36.0)
MCV: 93 fL (ref 80.0–100.0)
MPV: 9.5 fL (ref 7.5–12.5)
Monocytes Relative: 13.2 %
Neutro Abs: 2893 cells/uL (ref 1500–7800)
Neutrophils Relative %: 62.9 %
Platelets: 230 10*3/uL (ref 140–400)
RBC: 4.15 10*6/uL — ABNORMAL LOW (ref 4.20–5.80)
RDW: 13.3 % (ref 11.0–15.0)
Total Lymphocyte: 20.8 %
WBC: 4.6 10*3/uL (ref 3.8–10.8)

## 2020-02-27 LAB — COMPLETE METABOLIC PANEL WITH GFR
AG Ratio: 1.8 (calc) (ref 1.0–2.5)
ALT: 24 U/L (ref 9–46)
AST: 23 U/L (ref 10–35)
Albumin: 4.4 g/dL (ref 3.6–5.1)
Alkaline phosphatase (APISO): 77 U/L (ref 35–144)
BUN/Creatinine Ratio: 23 (calc) — ABNORMAL HIGH (ref 6–22)
BUN: 34 mg/dL — ABNORMAL HIGH (ref 7–25)
CO2: 26 mmol/L (ref 20–32)
Calcium: 9.4 mg/dL (ref 8.6–10.3)
Chloride: 104 mmol/L (ref 98–110)
Creat: 1.51 mg/dL — ABNORMAL HIGH (ref 0.70–1.11)
GFR, Est African American: 47 mL/min/{1.73_m2} — ABNORMAL LOW (ref >=60)
GFR, Est Non African American: 40 mL/min/{1.73_m2} — ABNORMAL LOW (ref >=60)
Globulin: 2.4 g/dL (calc) (ref 1.9–3.7)
Glucose, Bld: 94 mg/dL (ref 65–99)
Potassium: 5.1 mmol/L (ref 3.5–5.3)
Sodium: 139 mmol/L (ref 135–146)
Total Bilirubin: 0.8 mg/dL (ref 0.2–1.2)
Total Protein: 6.8 g/dL (ref 6.1–8.1)

## 2020-02-27 LAB — LIPID PANEL
Cholesterol: 218 mg/dL — ABNORMAL HIGH (ref <200)
HDL: 94 mg/dL (ref >=40)
LDL Cholesterol (Calc): 109 mg/dL (calc) — ABNORMAL HIGH
Non-HDL Cholesterol (Calc): 124 mg/dL (calc) (ref <130)
Total CHOL/HDL Ratio: 2.3 (calc) (ref <5.0)
Triglycerides: 68 mg/dL (ref <150)

## 2020-02-27 LAB — TSH: TSH: 2.88 mIU/L (ref 0.40–4.50)

## 2020-02-27 LAB — MAGNESIUM: Magnesium: 2.4 mg/dL (ref 1.5–2.5)

## 2020-02-27 NOTE — Patient Instructions (Signed)
Goals    . Blood Pressure < 140/80    . DIET - INCREASE WATER INTAKE          Chronic Kidney Disease, Adult Chronic kidney disease (CKD) occurs when the kidneys are slowly and permanently damaged over a long period of time. The kidneys are a pair of organs that do many important jobs in the body, including:  Removing waste and extra fluid from the blood to make urine.  Making hormones that maintain the amount of fluid in tissues and blood vessels.  Maintaining the right amount of fluids and chemicals in the body. A small amount of kidney damage may not cause problems, but a large amount of damage may make it hard or impossible for the kidneys to work right. Steps must be taken to slow kidney damage or to stop it from getting worse. If steps are not taken, the kidneys may stop working permanently (end-stage renal disease, or ESRD). Most of the time, CKD does not go away, but it can often be controlled. People who have CKD are usually able to live full lives. What are the causes? The most common causes of this condition are diabetes and high blood pressure (hypertension). Other causes include:  Cardiovascular diseases. These affect the heart and blood vessels.  Kidney diseases. These include: ? Glomerulonephritis, or inflammation of the tiny filters in the kidneys. ? Interstitial nephritis. This is swelling of the small tubes of the kidneys and of the surrounding structures. ? Polycystic kidney disease, in which clusters of fluid-filled sacs form within the kidneys. ? Renal vascular disease. This includes disorders that affect the arteries and veins of the kidneys.  Diseases that affect the body's defense system (immune system).  A problem with urine flow. This may be caused by: ? Kidney stones. ? Cancer. ? An enlarged prostate, in males.  A kidney infection or urinary tract infection (UTI) that keeps coming back.  Vasculitis. This is swelling or inflammation of the blood  vessels. What increases the risk? Your chances of having kidney disease increase with age. The following factors may make you more likely to develop this condition:  A family history of kidney disease or kidney failure. Kidney failure means the kidneys can no longer work right.  Certain genetic diseases.  Taking medicines often that are damaging to the kidneys.  Being around or being in contact with toxic substances.  Obesity.  A history of tobacco use. What are the signs or symptoms? Symptoms of this condition include:  Feeling very tired (lethargic) and having less energy.  Swelling, or edema, of the face, legs, ankles, or feet.  Nausea or vomiting, or loss of appetite.  Confusion or trouble concentrating.  Muscle twitches and cramps, especially in the legs.  Dry, itchy skin.  A metallic taste in the mouth.  Producing less urine, or producing more urine (especially at night).  Shortness of breath.  Trouble sleeping. CKD may also result in not having enough red blood cells or hemoglobin in the blood (anemia) or having weak bones (bone disease). Symptoms develop slowly and may not be obvious until the kidney damage becomes severe. It is possible to have kidney disease for years without having symptoms. How is this diagnosed? This condition may be diagnosed based on:  Blood tests.  Urine tests.  Imaging tests, such as an ultrasound or a CT scan.  A kidney biopsy. This involves removing a sample of kidney tissue to be looked at under a microscope. Results from these tests  will help to determine how serious the CKD is. How is this treated? There is no cure for most cases of this condition, but treatment usually relieves symptoms and prevents or slows the worsening of the disease. Treatment may include:  Diet changes, which may require you to avoid alcohol and foods that are high in salt, potassium, phosphorous, and protein.  Medicines. These may: ? Lower blood  pressure. ? Control blood sugar (glucose). ? Relieve anemia. ? Relieve swelling. ? Protect your bones. ? Improve the balance of salts and minerals in your blood (electrolytes).  Dialysis, which is a type of treatment that removes toxic waste from the body. It may be needed if you have kidney failure.  Managing any other conditions that are causing your CKD or making it worse. Follow these instructions at home: Medicines  Take over-the-counter and prescription medicines only as told by your health care provider. The amount of some medicines that you take may need to be changed.  Do not take any new medicines unless approved by your health care provider. Many medicines can make kidney damage worse.  Do not take any vitamin and mineral supplements unless approved by your health care provider. Many nutritional supplements can make kidney damage worse. Lifestyle  Do not use any products that contain nicotine or tobacco, such as cigarettes, e-cigarettes, and chewing tobacco. If you need help quitting, ask your health care provider.  If you drink alcohol: ? Limit how much you use to:  0-1 drink a day for women who are not pregnant.  0-2 drinks a day for men. ? Know how much alcohol is in your drink. In the [place] one drink equals one 12 oz bottle of beer (355 mL), one 5 oz glass of wine (148 mL), or one 1 oz glass of hard liquor (44 mL).  Maintain a healthy weight. If you need help, ask your health care provider.   General instructions  Follow instructions from your health care provider about eating or drinking restrictions, including any prescribed diet.  Track your blood pressure at home. Report changes in your blood pressure as told.  If you are being treated for diabetes, track your blood glucose levels as told.  Start or continue an exercise plan. Exercise at least 30 minutes a day, 5 days a week.  Keep your immunizations up to date as told.  Keep all follow-up visits. This  is important.   Where to find more information  American Association of Kidney Patients: BombTimer.gl  National Kidney Foundation: www.kidney.[place]: https://mathis.com/  Life Options: www.lifeoptions.org  Kidney [employer]: www.kidneyschool.org Contact a health care provider if:  Your symptoms get worse.  You develop new symptoms. Get help right away if:  You develop symptoms of ESRD. These include: ? Headaches. ? Numbness in your hands or feet. ? Easy bruising. ? Frequent hiccups. ? Chest pain. ? Shortness of breath. ? Lack of menstrual periods, in women.  You have a fever.  You are producing less urine than usual.  You have pain or bleeding when you urinate or when you have a bowel movement. These symptoms may represent a serious problem that is an emergency. Do not wait to see if the symptoms will go away. Get medical help right away. Call your local emergency services (911 in the [place] Do not drive yourself to the hospital. Summary  Chronic kidney disease (CKD) occurs when the kidneys become damaged slowly over a long period of time.  The most common causes  of this condition are diabetes and high blood pressure (hypertension).  There is no cure for most cases of CKD, but treatment usually relieves symptoms and prevents or slows the worsening of the disease. Treatment may include a combination of lifestyle changes, medicines, and dialysis. This information is not intended to replace advice given to you by your health care provider. Make sure you discuss any questions you have with your health care provider. Document Revised: 05/10/2019 Document Reviewed: 05/10/2019 Elsevier Patient Education  [place].

## 2020-04-30 ENCOUNTER — Other Ambulatory Visit: Payer: Self-pay

## 2020-04-30 MED ORDER — FINASTERIDE 5 MG PO TABS: 5.0000 mg | ORAL_TABLET | Freq: Every day | ORAL | 0 refills | Status: DC

## 2020-04-30 NOTE — Telephone Encounter (Signed)
Refill on PROSCAR

## 2020-05-26 ENCOUNTER — Encounter: Payer: Self-pay | Admitting: Internal Medicine

## 2020-05-26 NOTE — Patient Instructions (Signed)

## 2020-05-26 NOTE — Progress Notes (Signed)
Future Appointments  Date Time Provider [place]  05/27/2020  2:30 PM [name], MD [facility] None  12/09/2020  3:00 PM [name], MD [facility] None   History of Present Illness:      This very nice 87 y.o. Friedman presents for 6 month follow up with HTN, HLD, Pre-Diabetes, ASHD /cAfib,  and Vitamin D Deficiency.  Patient has hx/o Gleason 6 Prostate Ca followed by active surveillance by Dr Gerald Stabs Lovena Neighbours. Patient is reporting worsening Nocturia q1h despite being on Finasteride & Tamsulosin.       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 On March 15, he had 2sd vascular procedure for embolization of the AVM spinal cord fistula.        Patient is treated for HTN (2001) .  BP has been controlled at home. Today's BP is at goal - 124/72. Negative Cardiolite in 2011 and Negative ETT in 2017. Diagnosed in Sept 2020 with Afib (CHADsVASc = 3) and he was started on Xarelto.   He failed CV x 2 at [facility] in Oct & Dec 2020. Dr's at [facility] are now advising the his Xarelto dose be tapered from 20 to 15 mg based on age & renal functions.  Patient has had no complaints of any cardiac type chest pain, palpitations, dyspnea / orthopnea / PND, dizziness, claudication, or dependent edema.       Patient is intolerant to Statins and hyperlipidemia is not controlled with diet .  Last Lipids were not at goal:  Lab Results  Component Value Date   CHOL 218 (H) 02/27/2020   HDL 94 02/27/2020   LDLCALC 109 (H) 02/27/2020   TRIG 68 02/27/2020   CHOLHDL 2.3 02/27/2020     Also, the patient has history of PreDiabetes (A1c 6.1% /2014) and has had no symptoms of reactive hypoglycemia, diabetic polys, paresthesias or visual blurring.  Last A1c was Normal & at goal:  Lab Results  Component Value Date   HGBA1C 5.0 11/23/2019            Further, the patient also has history of Vitamin D Deficiency and supplements vitamin D without any suspected side-effects. Last vitamin D was at goal:  Lab Results  Component Value Date   VD25OH 66 11/23/2019    Current Outpatient Medications on File Prior to Visit  Medication Sig  . Cholecalciferol (VITAMIN D-3) 5000 UNITS TABS Take by mouth.  . dicyclomine (BENTYL) 20 MG tablet Take     1 tablet      3 x /day       as needed for Nausea - bloating -cramping or diarrhea  . diltiazem (CARDIZEM CD) 120 MG 24 hr capsule TAKE ONE CAPSULE BY MOUTH EVERY DAY FOR BLOOD PRESSURE AND HEART RHYTHM.  . finasteride (PROSCAR) 5 MG tablet Take 1 tablet (5 mg total) by mouth daily.  . flecainide (TAMBOCOR) 100 MG tablet Take     1 tablet     2 x /day  (every 12 hours )        for Afib  . neomycin-polymyxin-hydrocortisone (CORTISPORIN) 3.5-10000-1 OTIC suspension Place 4 drops into both ears 4 (four) times daily as needed.  . rivaroxaban (XARELTO) 20 MG TABS tablet Take 1 tablet daily to Prevent blood Clots And Strokes  . doxazosin (CARDURA) 8 MG tablet Take 1/2 to 1 tablet at Bedtime for Prostate &  BP (Patient not taking: No sig reported)  . hyoscyamine (LEVSIN SL) 0.125 MG SL tablet DISSOLVE 1 TO 2 TABLETS UNDER THE TONGUE FOUR TIMES DAILY AS NEEDED FOR ABDOMINAL CRAMPS OR NAUSEA OR DIARRHEA (Patient not taking: Reported on 05/27/2020)  . LUTEIN PO Take 5 mg by mouth 2 (two) times daily. (Patient not taking: Reported on 05/27/2020)  . tadalafil (CIALIS) 5 MG tablet Take 1 tablet Daily for Prostate (Patient not taking: No sig reported)   No current facility-administered medications on file prior to visit.    Allergies  Allergen Reactions  . Celebrex [Celecoxib] Other (See Comments)    GI upset  . Lipitor [Atorvastatin]   . Ppd [Tuberculin Purified Protein Derivative] Other (See Comments)    Positive PPD  . Adhesive [Tape] Rash    Zinc in the band-aid will cause a rash  . Nickel Rash     PMHx:   Past Medical History:  Diagnosis Date  . Allergic rhinitis   . Atrial fibrillation by electrocardiogram ([place]) 10/2018   Was started on calcium channel blocker, beta-blocker and Xarelto  . Cancer [facility])    prostate  . DJD (degenerative joint disease)   . Hypertension    Borderline   . Sinus bradycardia   . Spinal stenosis    Complicated by dural AVM  . Vitamin D deficiency     Immunization History  Administered Date(s) Administered  . H1N1 02/17/2007  . Influenza Whole 12/05/2012  . Influenza, High Dose Seasonal PF 11/14/2013, 11/20/2014, 12/16/2015, 10/30/2016, 02/04/2018, 11/22/2018, 11/23/2019  . Influenza-Unspecified 12/05/2012  . PFIZER(Purple Top)SARS-COV-2 Vaccination 03/09/2019, 03/30/2019, 11/07/2019  . Pneumococcal Conjugate-13 12/05/2014  . Pneumococcal Polysaccharide-23 03/08/2008, 05/18/2013  . Td 06/14/2019  . Tdap 02/17/2008  . Zoster 02/16/2005    Past Surgical History:  Procedure Laterality Date  . APPENDECTOMY    . Back Procedure  11/02/2018   [facility] -vascular neuroradiology occlusion of dural AVM  . cervical neck mass   07/08/06   Left Excision left posterior cervical neck mass (2.5 cm). SURGEON:  Earnstine Regal, M.D., Allegra Grana 07/08/06 and 12/05/07  . FLEXIBLE SIGMOIDOSCOPY    . HEMORRHOID SURGERY    . JOINT REPLACEMENT     Left knee 2013  . KNEE SURGERY    . MASTOIDECTOMY Left     FHx:    Reviewed / unchanged  SHx:    Reviewed / unchanged   Systems Review:  Constitutional: Denies fever, chills, wt changes, headaches, insomnia, fatigue, night sweats, change in appetite. Eyes: Denies redness, blurred vision, diplopia, discharge, itchy, watery eyes.  ENT: Denies discharge, congestion, post nasal drip, epistaxis, sore throat, earache, hearing loss, dental pain, tinnitus, vertigo, sinus pain, snoring.  CV: Denies chest pain, palpitations, irregular heartbeat, syncope, dyspnea, diaphoresis, orthopnea, PND, claudication or edema. Respiratory:  denies cough, dyspnea, DOE, pleurisy, hoarseness, laryngitis, wheezing.  Gastrointestinal: Denies dysphagia, odynophagia, heartburn, reflux, water brash, abdominal pain or cramps, nausea, vomiting, bloating, diarrhea, constipation, hematemesis, melena, hematochezia  or hemorrhoids. Genitourinary: Denies dysuria, frequency, urgency, nocturia, hesitancy, discharge, hematuria or flank pain. Musculoskeletal: Denies arthralgias, myalgias, stiffness, jt. swelling, pain, limping or strain/sprain.  Skin: Denies pruritus, rash, hives, warts, acne, eczema or change in skin lesion(s). Neuro: No weakness, tremor, incoordination, spasms, paresthesia or pain. Psychiatric: Denies confusion, memory loss or sensory loss. Endo: Denies change in weight, skin or hair change.  Heme/Lymph: No excessive bleeding, bruising or enlarged lymph nodes.  Physical Exam  BP 124/72   Pulse 64   Temp 97.7 F (36.5 C) (  Temporal)   Resp 16   Ht 6' 1.4" (1.864 m)   Wt 193 lb (87.5 kg)   SpO2 97%   BMI 25.19 kg/m   Appears  well nourished, well groomed  and in no distress.  Eyes: PERRLA, EOMs, conjunctiva no swelling or erythema. Sinuses: No frontal/maxillary tenderness ENT/Mouth: EAC's clear, TM's nl w/o erythema, bulging. Nares clear w/o erythema, swelling, exudates. Oropharynx clear without erythema or exudates. Oral hygiene is good. Tongue normal, non obstructing. Hearing intact.  Neck: Supple. Thyroid not palpable. Car 2+/2+ without bruits, nodes or JVD. Chest: Respirations nl with BS clear & equal w/o rales, rhonchi, wheezing or stridor.  Cor: Heart sounds normal w/ regular rate and rhythm without sig. murmurs, gallops, clicks or rubs. Peripheral pulses normal and equal  without edema.  Abdomen: Soft & bowel sounds normal. Non-tender w/o guarding, rebound, hernias, masses or organomegaly.  Lymphatics: Unremarkable.  Musculoskeletal: Full ROM all peripheral extremities, joint stability, 5/5 strength and sl  broad-based short steppage gait.  Skin: Warm, dry without exposed rashes, lesions or ecchymosis apparent.  Neuro: Cranial nerves intact, reflexes equal bilaterally. Sensory-motor testing grossly intact. Tendon reflexes grossly intact.  Pysch: Alert & oriented x 3.  Insight and judgement nl & appropriate. No ideations.  Assessment and Plan:  1. Essential hypertension  - Continue medication, monitor blood pressure at home.  - Continue DASH diet.  Reminder to go to the ER if any CP,  SOB, nausea, dizziness, severe HA, changes vision/speech.  - CBC with Differential/Platelet - COMPLETE METABOLIC PANEL WITH GFR - Magnesium - TSH  2. Hyperlipidemia, mixed  - Continue diet/meds, exercise,& lifestyle modifications.  - Continue monitor periodic cholesterol/liver & renal functions  - Lipid panel - TSH  3. Abnormal glucose  - Continue diet, exercise  - Lifestyle modifications.  - Monitor appropriate labs  - Hemoglobin A1c - Insulin, random  4. Vitamin D deficiency  - Continue supplementation.  - VITAMIN D 25 Hydroxy  5. AVM (arteriovenous malformation) spine   6. Medication management  - CBC with Differential/Platelet - COMPLETE METABOLIC PANEL WITH GFR - Magnesium - Lipid panel - TSH - Hemoglobin A1c - Insulin, random - VITAMIN D 25 Hydroxy         Discussed  regular exercise, BP monitoring, weight control to achieve/maintain BMI less than 25 and discussed med and SE's. Recommended labs to assess and monitor clinical status with further disposition pending results of labs.  I discussed the assessment and treatment plan with the patient. The patient was provided an opportunity to ask questions and all were answered. The patient agreed with the plan and demonstrated an understanding of the instructions.  I provided over 30 minutes of exam, counseling, chart review and  complex critical decision making.        The patient was advised to call back or seek an in-person  evaluation if the symptoms worsen or if the condition fails to improve as anticipated.   Kirtland Bouchard, MD

## 2020-05-27 ENCOUNTER — Other Ambulatory Visit: Payer: Self-pay

## 2020-05-27 ENCOUNTER — Ambulatory Visit (INDEPENDENT_AMBULATORY_CARE_PROVIDER_SITE_OTHER): Payer: Medicare Other | Admitting: Internal Medicine

## 2020-05-27 VITALS — BP 124/72 | HR 64 | Temp 97.7°F | Resp 16 | Ht 73.4 in | Wt 193.0 lb

## 2020-05-27 DIAGNOSIS — I4891 Unspecified atrial fibrillation: Secondary | ICD-10-CM

## 2020-05-27 DIAGNOSIS — R7309 Other abnormal glucose: Secondary | ICD-10-CM

## 2020-05-27 DIAGNOSIS — N401 Enlarged prostate with lower urinary tract symptoms: Secondary | ICD-10-CM

## 2020-05-27 DIAGNOSIS — E782 Mixed hyperlipidemia: Secondary | ICD-10-CM

## 2020-05-27 DIAGNOSIS — Z79899 Other long term (current) drug therapy: Secondary | ICD-10-CM

## 2020-05-27 DIAGNOSIS — I10 Essential (primary) hypertension: Secondary | ICD-10-CM | POA: Diagnosis not present

## 2020-05-27 DIAGNOSIS — Q288 Other specified congenital malformations of circulatory system: Secondary | ICD-10-CM

## 2020-05-27 DIAGNOSIS — E559 Vitamin D deficiency, unspecified: Secondary | ICD-10-CM | POA: Diagnosis not present

## 2020-05-27 DIAGNOSIS — N138 Other obstructive and reflux uropathy: Secondary | ICD-10-CM

## 2020-05-27 MED ORDER — TAMSULOSIN HCL 0.4 MG PO CAPS: ORAL_CAPSULE | ORAL | 3 refills | Status: DC

## 2020-05-27 MED ORDER — RIVAROXABAN 15 MG PO TABS: ORAL_TABLET | ORAL | 3 refills | Status: DC

## 2020-05-28 LAB — VITAMIN D 25 HYDROXY (VIT D DEFICIENCY, FRACTURES): Vit D, 25-Hydroxy: 77 ng/mL (ref 30–100)

## 2020-05-28 LAB — HEMOGLOBIN A1C
Hgb A1c MFr Bld: 5.1 % of total Hgb (ref <5.7)
Mean Plasma Glucose: 100 mg/dL
eAG (mmol/L): 5.5 mmol/L

## 2020-05-28 LAB — CBC WITH DIFFERENTIAL/PLATELET
Absolute Monocytes: 717 cells/uL (ref 200–950)
Basophils Absolute: 34 cells/uL (ref 0–200)
Basophils Relative: 0.5 %
Eosinophils Absolute: 114 cells/uL (ref 15–500)
Eosinophils Relative: 1.7 %
HCT: 38.1 % — ABNORMAL LOW (ref 38.5–50.0)
Hemoglobin: 12.9 g/dL — ABNORMAL LOW (ref 13.2–17.1)
Lymphs Abs: 925 cells/uL (ref 850–3900)
MCH: 31.2 pg (ref 27.0–33.0)
MCHC: 33.9 g/dL (ref 32.0–36.0)
MCV: 92.3 fL (ref 80.0–100.0)
MPV: 9.2 fL (ref 7.5–12.5)
Monocytes Relative: 10.7 %
Neutro Abs: 4911 cells/uL (ref 1500–7800)
Neutrophils Relative %: 73.3 %
Platelets: 264 10*3/uL (ref 140–400)
RBC: 4.13 10*6/uL — ABNORMAL LOW (ref 4.20–5.80)
RDW: 12.5 % (ref 11.0–15.0)
Total Lymphocyte: 13.8 %
WBC: 6.7 10*3/uL (ref 3.8–10.8)

## 2020-05-28 LAB — LIPID PANEL
Cholesterol: 201 mg/dL — ABNORMAL HIGH (ref <200)
HDL: 83 mg/dL (ref >=40)
LDL Cholesterol (Calc): 96 mg/dL (calc)
Non-HDL Cholesterol (Calc): 118 mg/dL (calc) (ref <130)
Total CHOL/HDL Ratio: 2.4 (calc) (ref <5.0)
Triglycerides: 123 mg/dL (ref <150)

## 2020-05-28 LAB — COMPLETE METABOLIC PANEL WITH GFR
AG Ratio: 1.8 (calc) (ref 1.0–2.5)
ALT: 22 U/L (ref 9–46)
AST: 22 U/L (ref 10–35)
Albumin: 4.2 g/dL (ref 3.6–5.1)
Alkaline phosphatase (APISO): 99 U/L (ref 35–144)
BUN/Creatinine Ratio: 18 (calc) (ref 6–22)
BUN: 29 mg/dL — ABNORMAL HIGH (ref 7–25)
CO2: 27 mmol/L (ref 20–32)
Calcium: 9.3 mg/dL (ref 8.6–10.3)
Chloride: 104 mmol/L (ref 98–110)
Creat: 1.57 mg/dL — ABNORMAL HIGH (ref 0.70–1.11)
GFR, Est African American: 45 mL/min/{1.73_m2} — ABNORMAL LOW (ref >=60)
GFR, Est Non African American: 39 mL/min/{1.73_m2} — ABNORMAL LOW (ref >=60)
Globulin: 2.4 g/dL (calc) (ref 1.9–3.7)
Glucose, Bld: 86 mg/dL (ref 65–99)
Potassium: 4.9 mmol/L (ref 3.5–5.3)
Sodium: 139 mmol/L (ref 135–146)
Total Bilirubin: 0.6 mg/dL (ref 0.2–1.2)
Total Protein: 6.6 g/dL (ref 6.1–8.1)

## 2020-05-28 LAB — MAGNESIUM: Magnesium: 2.1 mg/dL (ref 1.5–2.5)

## 2020-05-28 LAB — TSH: TSH: 2.38 mIU/L (ref 0.40–4.50)

## 2020-05-28 LAB — INSULIN, RANDOM: Insulin: 5.9 u[IU]/mL

## 2020-05-28 NOTE — Progress Notes (Signed)
============================================================ -   Test results slightly outside the reference range are not unusual. If there is anything important, I will review this with you,  otherwise it is considered normal test values.  If you have further questions,  please do not hesitate to contact me at the office or via My Chart.  ============================================================ ============================================================  -  Kidney Functions - Stable in Stage 3b ============================================================ ============================================================  - Total Chol = 201 and LDL 96 - Both Great ============================================================ ============================================================  - A1c -Normal - No Diabetes - Great  ! ============================================================ ============================================================  - Vitamin V = 77 - Excellent  ============================================================ ============================================================  - All Else - CBC -Electrolytes - Liver - Magnesium & Thyroid    - all  Normal / OK ============================================================ ============================================================

## 2020-05-29 ENCOUNTER — Other Ambulatory Visit: Payer: Self-pay | Admitting: Adult Health

## 2020-05-29 ENCOUNTER — Encounter: Payer: Self-pay | Admitting: Internal Medicine

## 2020-05-29 DIAGNOSIS — I4891 Unspecified atrial fibrillation: Secondary | ICD-10-CM

## 2020-06-28 ENCOUNTER — Other Ambulatory Visit: Payer: Self-pay | Admitting: Internal Medicine

## 2020-06-28 ENCOUNTER — Other Ambulatory Visit: Payer: Self-pay

## 2020-06-28 DIAGNOSIS — K582 Mixed irritable bowel syndrome: Secondary | ICD-10-CM

## 2020-06-28 MED ORDER — FINASTERIDE 5 MG PO TABS: ORAL_TABLET | ORAL | 0 refills | Status: DC

## 2020-07-01 ENCOUNTER — Other Ambulatory Visit: Payer: Self-pay | Admitting: Adult Health

## 2020-07-01 DIAGNOSIS — I4891 Unspecified atrial fibrillation: Secondary | ICD-10-CM

## 2020-07-22 ENCOUNTER — Telehealth: Payer: Self-pay | Admitting: Internal Medicine

## 2020-07-22 NOTE — Progress Notes (Signed)
  Chronic Care Management   Outreach Note  07/22/2020 Name: Eugene Friedman MRN: [id number] DOB: [birth year]  Referred by: [name], MD Reason for referral : No chief complaint on file.   An unsuccessful telephone outreach was attempted today. The patient was referred to the pharmacist for assistance with care management and care coordination.   Follow Up Plan:   Tatjana Dellinger Upstream Scheduler

## 2020-11-06 NOTE — Progress Notes (Signed)
   Future Appointments  Date Time Provider [place]  11/07/2020 10:00 AM [name], MD [facility] None  12/09/2020  3:00 PM [name], MD [facility] None    History of Present Illness:    Patient presents w/o new c/o's & is followed for HTN, HLD & pAfib. Generally doing well & has no complaints.   Medications   Current Outpatient Medications (Cardiovascular):    diltiazem (CARDIZEM CD) 120 MG 24 hr capsule, TAKE 1 CAPSULE BY MOUTH EVERY DAY FOR BLOOD PRESSURE AND HEART RHYTHM   flecainide (TAMBOCOR) 100 MG tablet, Take     1 tablet     2 x /day  (every 12 hours )        for Afib   tadalafil (CIALIS) 5 MG tablet, Take 1 tablet Daily for Prostate (Patient not taking: No sig reported)    Current Outpatient Medications (Hematological):    Rivaroxaban (XARELTO) 15 MG TABS tablet, Take  1 tablet  Daily  to Prevent Blood Clots  from Atrial Fibrillation  Current Outpatient Medications (Other):    Cholecalciferol (VITAMIN D-3) 5000 UNITS TABS, Take by mouth.   dicyclomine (BENTYL) 20 MG tablet, Take  1 tablet  3 to 4 x /day  before Meals & Bedtime as needed for Nausea, cramping, Bloating or Diarrhea   finasteride (PROSCAR) 5 MG tablet, Take  1 tablet  Daily  for Prostate   hyoscyamine (LEVSIN SL) 0.125 MG SL tablet, DISSOLVE 1 TO 2 TABLETS UNDER THE TONGUE FOUR TIMES DAILY AS NEEDED FOR ABDOMINAL CRAMPS OR NAUSEA OR DIARRHEA   LUTEIN PO, Take 5 mg by mouth 2 (two) times daily.   neomycin-polymyxin-hydrocortisone (CORTISPORIN) 3.5-10000-1 OTIC suspension, Place 4 drops into both ears 4 (four) times daily as needed.   tamsulosin (FLOMAX) 0.4 MG CAPS capsule, Take 1 tablet at Bedtime for Prostate  Problem list He has Hyperlipidemia; Sinus bradycardia; Vitamin D deficiency; History of elevated glucose; Elevated prostate specific antigen (PSA); Labile hypertension; Medication management; Prostate cancer ([place]); Spinal stenosis; Persistent atrial fibrillation: CHA2DS2Vasc = 3  (Age & HTN); AVM (arteriovenous malformation); and Stage 3b chronic kidney disease (HCC) on their problem list.   Observations/Objective:  BP 118/70   Pulse 72   Temp (!) 97.5 F (36.4 C)   Resp 16   Ht 6\' 1"  (1.854 m)   Wt 199 lb 6.4 oz (90.4 kg)   SpO2 98%   BMI 26.31 kg/m   HEENT - WNL. Neck - supple.  Chest - Clear equal BS. Cor - Nl HS. RRR w/o sig MGR. PP 1(+). No edema. MS- FROM w/o deformities.  Gait Nl. Neuro -  Nl w/o focal abnormalities.   Assessment and Plan:  1. Essential hypertension   2. Hyperlipidemia, mixed   3. Abnormal glucose   4. Vitamin D deficiency   5. Medication management   Follow Up Instructions:        I discussed the assessment and treatment plan with the patient. The patient was provided an opportunity to ask questions and all were answered. The patient agreed with the plan and demonstrated an understanding of the instructions.       The patient was advised to call back or seek an in-person evaluation if the symptoms worsen or if the condition fails to improve as anticipated.    Kirtland Bouchard, MD

## 2020-11-07 ENCOUNTER — Ambulatory Visit (INDEPENDENT_AMBULATORY_CARE_PROVIDER_SITE_OTHER): Payer: Medicare Other | Admitting: Internal Medicine

## 2020-11-07 ENCOUNTER — Other Ambulatory Visit: Payer: Self-pay

## 2020-11-07 VITALS — BP 118/70 | HR 72 | Temp 97.5°F | Resp 16 | Ht 73.0 in | Wt 199.4 lb

## 2020-11-07 DIAGNOSIS — E559 Vitamin D deficiency, unspecified: Secondary | ICD-10-CM

## 2020-11-07 DIAGNOSIS — E782 Mixed hyperlipidemia: Secondary | ICD-10-CM | POA: Diagnosis not present

## 2020-11-07 DIAGNOSIS — I10 Essential (primary) hypertension: Secondary | ICD-10-CM

## 2020-11-07 DIAGNOSIS — R7309 Other abnormal glucose: Secondary | ICD-10-CM | POA: Diagnosis not present

## 2020-11-07 DIAGNOSIS — Z79899 Other long term (current) drug therapy: Secondary | ICD-10-CM

## 2020-11-10 ENCOUNTER — Encounter: Payer: Self-pay | Admitting: Internal Medicine

## 2020-12-06 ENCOUNTER — Other Ambulatory Visit: Payer: Self-pay | Admitting: Internal Medicine

## 2020-12-06 MED ORDER — DEXAMETHASONE 4 MG PO TABS: ORAL_TABLET | ORAL | 1 refills | Status: DC

## 2020-12-08 NOTE — Patient Instructions (Signed)

## 2020-12-08 NOTE — Progress Notes (Signed)
Annual  Screening/Preventative Visit  & Comprehensive Evaluation & Examination  Future Appointments  Date Time Provider [place]  12/09/2020   - CPE   3:00 PM [name], MD [facility] None            This very nice 87 y.o. [facility] presents for a comprehensive evaluation and management of multiple medical co-morbidities.  This patient has been followed  with HTN, HLD, Pre-Diabetes, ASHD /cAfib,  IBS  and Vitamin D Deficiency.  Patient has hx/o Gleason 6 Prostate Ca followed by active surveillance.                                                   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 On March 15, he had 2sd vascular procedure for embolization of the AVM spinal cord fistula.         Patient's HTN predates since  2001. Patient's BP has been controlled at home.  Today's BP: (!) 160/70.  Patient had a Negative Cardiolite in 2011 and Negative ETT in 2017.  Then he was dx'd in Sept 2020 with Afib (Sept 2020) (CHADsVASc = 3) and he was started on Xarelto.   He failed CV x 2 at [facility] in Oct & Dec 2020. Dr's at [facility]  advised  tapering his Xarelto dose from 20 to 15 mg based on age & renal functions.  Patient's local Cardiologist is Dr Ellyn Hack. Patient denies any cardiac symptoms as chest pain, palpitations, shortness of breath, dizziness or ankle swelling.       Patient's hyperlipidemia is  marginally controlled with diet and he is statin tolerant. Patient denies myalgias or other medication SE's. Last lipids were at goal:  Lab Results  Component Value Date   CHOL 201 (H) 05/27/2020   HDL 83 05/27/2020   LDLCALC 96 05/27/2020   TRIG 123 05/27/2020   CHOLHDL 2.4 05/27/2020         Patient has hx/o prediabetes (A1c 6.1% /2014) and patient denies reactive hypoglycemic symptoms, visual blurring, diabetic polys or paresthesias. Last A1c was normal & at  goal:   Lab Results  Component Value Date   HGBA1C 5.1 05/27/2020          Finally, patient has history of Vitamin D Deficiency ("42" /2009) and last vitamin D was at goal:   Lab Results  Component Value Date   VD25OH 77 05/27/2020     Current Outpatient Medications on File Prior to Visit  Medication Sig   Cholecalciferol (VITAMIN D-3) 5000 UNITS TABS Take by mouth.   dexamethasone (DECADRON) 4 MG tablet Take 1 tab 3 x day - 3 days, then 2 x day - 3 days, then 1 tab daily   dicyclomine (BENTYL) 20 MG tablet Take  1 tablet  3 to 4 x /day  before Meals & Bedtime as needed for Nausea, cramping, Bloating or Diarrhea   diltiazem (CARDIZEM CD) 120 MG 24 hr capsule TAKE 1 CAPSULE BY MOUTH EVERY DAY FOR BLOOD PRESSURE AND HEART RHYTHM   finasteride (PROSCAR) 5 MG tablet Take  1 tablet  Daily  for Prostate   flecainide (TAMBOCOR) 100 MG tablet Take     1 tablet     2 x /day  (every 12 hours )  for Afib   hyoscyamine (LEVSIN SL) 0.125 MG SL tablet DISSOLVE 1 TO 2 TABLETS UNDER THE TONGUE FOUR TIMES DAILY AS NEEDED FOR ABDOMINAL CRAMPS OR NAUSEA OR DIARRHEA   LUTEIN PO Take 5 mg by mouth 2 (two) times daily.   neomycin-polymyxin-hydrocortisone (CORTISPORIN) 3.5-10000-1 OTIC suspension Place 4 drops into both ears 4 (four) times daily as needed.   Rivaroxaban (XARELTO) 15 MG TABS tablet Take  1 tablet  Daily  to Prevent Blood Clots  from Atrial Fibrillation   tamsulosin (FLOMAX) 0.4 MG CAPS capsule Take 1 tablet at Bedtime for Prostate   No current facility-administered medications on file prior to visit.     Allergies  Allergen Reactions   Celebrex [Celecoxib] Other (See Comments)    GI upset   Lipitor [Atorvastatin]    Ppd [Tuberculin Purified Protein Derivative] Other (See Comments)    Positive PPD   Adhesive [Tape] Rash    Zinc in the band-aid will cause a rash   Nickel Rash     Past Medical History:  Diagnosis Date   Allergic rhinitis    Atrial fibrillation by  electrocardiogram ([place]) 10/2018   Was started on calcium channel blocker, beta-blocker and Xarelto   Cancer ([place])    prostate   DJD (degenerative joint disease)    Hypertension    Borderline    Sinus bradycardia    Spinal stenosis    Complicated by dural AVM   Vitamin D deficiency      Health Maintenance  Topic Date Due   Zoster Vaccines- Shingrix (1 of 2) Never done   COVID-19 Vaccine (4 - Booster for Pfizer series) 01/02/2020   INFLUENZA VACCINE  09/16/2020   TETANUS/TDAP  06/13/2029   Pneumonia Vaccine 71+ Years old  Completed   HPV VACCINES  Aged Out     Immunization History  Administered Date(s) Administered   H1N1 02/17/2007   Influenza  12/05/2012   Influenza, High Dose  10/30/2016, 02/04/2018, 11/22/2018, 11/23/2019   Influenza 12/05/2012   PFIZER SARS-COV-2 Vacc 03/09/2019, 03/30/2019, 11/07/2019   Pneumococcal -13 12/05/2014   Pneumococcal -23 03/08/2008, 05/18/2013   Td 06/14/2019   Tdap 02/17/2008   Zoster, Live 02/16/2005    Last Colon -   Past Surgical History:  Procedure Laterality Date   APPENDECTOMY     Back Procedure  11/02/2018   [facility] -vascular neuroradiology occlusion of dural AVM   cervical neck mass   07/08/06   Left Excision left posterior cervical neck mass (2.5 cm). 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 Does c/o fatigue. Eyes: Denies redness, blurred vision, diplopia, discharge, itchy or watery eyes.  ENT: Denies discharge, congestion, post nasal drip, epistaxis, sore throat, earache, hearing loss, dental pain, Tinnitus, Vertigo, Sinus pain or snoring.  Cardio: Denies chest pain, palpitations, irregular heartbeat, syncope, dyspnea, diaphoresis, orthopnea, PND, claudication or edema Respiratory: denies cough, dyspnea, DOE, pleurisy, hoarseness, laryngitis or wheezing.  Gastrointestinal: Denies dysphagia, heartburn, reflux, water brash, pain, cramps, nausea, vomiting, bloating, diarrhea, constipation, hematemesis, melena, hematochezia, jaundice or hemorrhoids Genitourinary: Denies dysuria, frequency, discharge, hematuria or flank pain. Has urgency, nocturia x 2-3 & occasional hesitancy. Musculoskeletal: Denies arthralgia, myalgia, stiffness, Jt. Swelling, pain, limp or strain/sprain. Denies Falls. Skin: Denies puritis, rash, hives, warts, acne, eczema or change in skin lesion Neuro: No weakness, tremor, incoordination, spasms, paresthesia or pain Psychiatric: Denies confusion, memory loss or sensory loss. Denies Depression. Endocrine: Denies change in weight, skin, hair change, nocturia, and paresthesia, diabetic polys, visual blurring or hyper / hypo glycemic episodes.  Heme/Lymph: No excessive bleeding, bruising or enlarged lymph nodes.   Physical Exam  BP (!) 160/70   Pulse 79   Temp 97.8 F (36.6 C)   Resp 16   Ht 6\' 1"  (1.854 m)   Wt 201 lb 3.2 oz (91.3 kg)   SpO2 97%   BMI 26.55 kg/m   General Appearance: Well nourished and well groomed and in no apparent distress.  Eyes: PERRLA, EOMs, conjunctiva no swelling or erythema, normal fundi and  vessels. Sinuses: No frontal/maxillary tenderness ENT/Mouth: EACs patent / TMs  nl. Nares clear without erythema, swelling, mucoid exudates. Oral hygiene is good. No erythema, swelling, or exudate. Tongue normal, non-obstructing. Tonsils not swollen or erythematous. Hearing normal.  Neck: Supple, thyroid not palpable. No bruits, nodes or JVD. Respiratory: Respiratory effort normal.  BS equal and clear bilateral without rales, rhonci, wheezing or stridor. Cardio: Heart sounds are normal with regular rate and rhythm and no murmurs, rubs or gallops. Peripheral pulses are normal and equal bilaterally without edema. No aortic or femoral bruits. Chest: symmetric with normal excursions and percussion.  Abdomen: Soft, with Nl bowel sounds. Nontender, no guarding, rebound, hernias, masses, or organomegaly.  Lymphatics: Non tender without lymphadenopathy.  Musculoskeletal: Full ROM all peripheral extremities, joint stability, 5/5 strength, and sl broad based shuffling gait. Skin: Warm and dry without rashes, lesions, cyanosis, clubbing or  ecchymosis.  Neuro: Cranial nerves intact, reflexes equal bilaterally. Normal muscle tone, no cerebellar symptoms. Sensation intact.  Pysch: Alert and oriented X 3 with normal affect, insight and judgment appropriate.   Assessment and Plan  1. Essential hypertension  - EKG 12-Lead - Urinalysis, Routine w reflex microscopic - Microalbumin / creatinine urine ratio - CBC with Differential/Platelet - COMPLETE METABOLIC PANEL WITH GFR - Magnesium - TSH  2. Hyperlipidemia, mixed  - EKG 12-Lead - Lipid panel - TSH  3. Abnormal glucose  - EKG 12-Lead - Hemoglobin A1c - Insulin, random  4. Vitamin D deficiency  - VITAMIN D 25 Hydroxy   5. Chronic atrial fibrillation (HCC)  - EKG 12-Lead - TSH  6. Stage 3b chronic kidney disease (HCC)  - Urinalysis, Routine w reflex microscopic - Microalbumin / creatinine urine ratio - COMPLETE METABOLIC PANEL WITH  GFR  7. History of prostate cancer  - PSA  8. Benign localized prostatic hyperplasia with  lower urinary tract symptoms (LUTS)  - PSA  9. Screening for colorectal cancer  - Cologuard  10. Screening for ischemic heart disease  - EKG 12-Lead  11. FHx: heart disease  - EKG 12-Lead  12. Screening for AAA (aortic abdominal aneurysm)   13. Medication management  - Urinalysis, Routine w reflex microscopic - Microalbumin / creatinine urine ratio - CBC with Differential/Platelet - COMPLETE METABOLIC PANEL WITH GFR - Magnesium - Lipid panel - TSH - Hemoglobin A1c - Insulin, random - VITAMIN D 25 Hydroxy          Patient was counseled in prudent diet, weight control to achieve/maintain BMI less than 25, BP monitoring, regular exercise and medications as discussed.  Discussed med effects and SE's. Routine screening labs and tests as requested with regular follow-up as recommended. Over 40 minutes of exam, counseling, chart review and high complex critical decision making was performed   Kirtland Bouchard, MD

## 2020-12-09 ENCOUNTER — Ambulatory Visit (INDEPENDENT_AMBULATORY_CARE_PROVIDER_SITE_OTHER): Payer: Medicare Other | Admitting: Internal Medicine

## 2020-12-09 ENCOUNTER — Encounter: Payer: Self-pay | Admitting: Internal Medicine

## 2020-12-09 ENCOUNTER — Other Ambulatory Visit: Payer: Self-pay

## 2020-12-09 VITALS — BP 160/70 | HR 79 | Temp 97.8°F | Resp 16 | Ht 73.0 in | Wt 201.2 lb

## 2020-12-09 DIAGNOSIS — Z8249 Family history of ischemic heart disease and other diseases of the circulatory system: Secondary | ICD-10-CM

## 2020-12-09 DIAGNOSIS — I482 Chronic atrial fibrillation, unspecified: Secondary | ICD-10-CM

## 2020-12-09 DIAGNOSIS — E782 Mixed hyperlipidemia: Secondary | ICD-10-CM | POA: Diagnosis not present

## 2020-12-09 DIAGNOSIS — N401 Enlarged prostate with lower urinary tract symptoms: Secondary | ICD-10-CM

## 2020-12-09 DIAGNOSIS — Z136 Encounter for screening for cardiovascular disorders: Secondary | ICD-10-CM | POA: Diagnosis not present

## 2020-12-09 DIAGNOSIS — R7309 Other abnormal glucose: Secondary | ICD-10-CM

## 2020-12-09 DIAGNOSIS — I10 Essential (primary) hypertension: Secondary | ICD-10-CM

## 2020-12-09 DIAGNOSIS — Z23 Encounter for immunization: Secondary | ICD-10-CM | POA: Diagnosis not present

## 2020-12-09 DIAGNOSIS — N1832 Chronic kidney disease, stage 3b: Secondary | ICD-10-CM

## 2020-12-09 DIAGNOSIS — Z79899 Other long term (current) drug therapy: Secondary | ICD-10-CM

## 2020-12-09 DIAGNOSIS — Z8546 Personal history of malignant neoplasm of prostate: Secondary | ICD-10-CM

## 2020-12-09 DIAGNOSIS — E559 Vitamin D deficiency, unspecified: Secondary | ICD-10-CM | POA: Diagnosis not present

## 2020-12-09 DIAGNOSIS — Z1211 Encounter for screening for malignant neoplasm of colon: Secondary | ICD-10-CM

## 2020-12-10 ENCOUNTER — Other Ambulatory Visit: Payer: Self-pay | Admitting: Internal Medicine

## 2020-12-10 DIAGNOSIS — E0590 Thyrotoxicosis, unspecified without thyrotoxic crisis or storm: Secondary | ICD-10-CM

## 2020-12-10 DIAGNOSIS — N3000 Acute cystitis without hematuria: Secondary | ICD-10-CM

## 2020-12-10 LAB — CBC WITH DIFFERENTIAL/PLATELET
Absolute Monocytes: 696 cells/uL (ref 200–950)
Basophils Absolute: 24 cells/uL (ref 0–200)
Basophils Relative: 0.2 %
Eosinophils Absolute: 0 cells/uL — ABNORMAL LOW (ref 15–500)
Eosinophils Relative: 0 %
HCT: 38.9 % (ref 38.5–50.0)
Hemoglobin: 13 g/dL — ABNORMAL LOW (ref 13.2–17.1)
Lymphs Abs: 755 cells/uL — ABNORMAL LOW (ref 850–3900)
MCH: 30.6 pg (ref 27.0–33.0)
MCHC: 33.4 g/dL (ref 32.0–36.0)
MCV: 91.5 fL (ref 80.0–100.0)
MPV: 9.5 fL (ref 7.5–12.5)
Monocytes Relative: 5.9 %
Neutro Abs: 10325 cells/uL — ABNORMAL HIGH (ref 1500–7800)
Neutrophils Relative %: 87.5 %
Platelets: 295 10*3/uL (ref 140–400)
RBC: 4.25 10*6/uL (ref 4.20–5.80)
RDW: 12.7 % (ref 11.0–15.0)
Total Lymphocyte: 6.4 %
WBC: 11.8 10*3/uL — ABNORMAL HIGH (ref 3.8–10.8)

## 2020-12-10 LAB — URINALYSIS, ROUTINE W REFLEX MICROSCOPIC
Bilirubin Urine: NEGATIVE
Glucose, UA: NEGATIVE
Hgb urine dipstick: NEGATIVE
Hyaline Cast: NONE SEEN /LPF
Ketones, ur: NEGATIVE
Nitrite: POSITIVE — AB
RBC / HPF: NONE SEEN /HPF (ref 0–2)
Specific Gravity, Urine: 1.018 (ref 1.001–1.035)
pH: 5.5 (ref 5.0–8.0)

## 2020-12-10 LAB — COMPLETE METABOLIC PANEL WITH GFR
AG Ratio: 1.7 (calc) (ref 1.0–2.5)
ALT: 27 U/L (ref 9–46)
AST: 23 U/L (ref 10–35)
Albumin: 4.4 g/dL (ref 3.6–5.1)
Alkaline phosphatase (APISO): 95 U/L (ref 35–144)
BUN/Creatinine Ratio: 30 (calc) — ABNORMAL HIGH (ref 6–22)
BUN: 41 mg/dL — ABNORMAL HIGH (ref 7–25)
CO2: 19 mmol/L — ABNORMAL LOW (ref 20–32)
Calcium: 9.3 mg/dL (ref 8.6–10.3)
Chloride: 102 mmol/L (ref 98–110)
Creat: 1.38 mg/dL — ABNORMAL HIGH (ref 0.70–1.22)
Globulin: 2.6 g/dL (calc) (ref 1.9–3.7)
Glucose, Bld: 123 mg/dL — ABNORMAL HIGH (ref 65–99)
Potassium: 4.6 mmol/L (ref 3.5–5.3)
Sodium: 138 mmol/L (ref 135–146)
Total Bilirubin: 0.6 mg/dL (ref 0.2–1.2)
Total Protein: 7 g/dL (ref 6.1–8.1)
eGFR: 49 mL/min/{1.73_m2} — ABNORMAL LOW (ref >=60)

## 2020-12-10 LAB — LIPID PANEL
Cholesterol: 227 mg/dL — ABNORMAL HIGH (ref <200)
HDL: 93 mg/dL (ref >=40)
LDL Cholesterol (Calc): 116 mg/dL (calc) — ABNORMAL HIGH
Non-HDL Cholesterol (Calc): 134 mg/dL (calc) — ABNORMAL HIGH (ref <130)
Total CHOL/HDL Ratio: 2.4 (calc) (ref <5.0)
Triglycerides: 85 mg/dL (ref <150)

## 2020-12-10 LAB — MAGNESIUM: Magnesium: 2.4 mg/dL (ref 1.5–2.5)

## 2020-12-10 LAB — HEMOGLOBIN A1C
Hgb A1c MFr Bld: 5.2 % of total Hgb (ref <5.7)
Mean Plasma Glucose: 103 mg/dL
eAG (mmol/L): 5.7 mmol/L

## 2020-12-10 LAB — PSA: PSA: 4.52 ng/mL — ABNORMAL HIGH (ref <=4.00)

## 2020-12-10 LAB — MICROALBUMIN / CREATININE URINE RATIO
Creatinine, Urine: 73 mg/dL (ref 20–320)
Microalb Creat Ratio: 171 mcg/mg creat — ABNORMAL HIGH (ref <30)
Microalb, Ur: 12.5 mg/dL

## 2020-12-10 LAB — INSULIN, RANDOM: Insulin: 22.7 u[IU]/mL — ABNORMAL HIGH

## 2020-12-10 LAB — TSH: TSH: 0.34 mIU/L — ABNORMAL LOW (ref 0.40–4.50)

## 2020-12-10 LAB — MICROSCOPIC MESSAGE

## 2020-12-10 LAB — VITAMIN D 25 HYDROXY (VIT D DEFICIENCY, FRACTURES): Vit D, 25-Hydroxy: 74 ng/mL (ref 30–100)

## 2020-12-10 MED ORDER — OLMESARTAN MEDOXOMIL 40 MG PO TABS: ORAL_TABLET | ORAL | 3 refills | Status: DC

## 2020-12-10 NOTE — Addendum Note (Signed)
Addended by: [name] on: 12/10/2020 04:15 PM   Modules accepted: Orders

## 2020-12-10 NOTE — Progress Notes (Signed)
============================================================ - Test results slightly outside the reference range are not unusual. If there is anything important, I will review this with you,  otherwise it is considered normal test values.  If you have further questions,  please do not hesitate to contact me at the office or via My Chart.  ============================================================ ============================================================  -  U/A is suspicious for UTI - Will ask Lab to do a culture to check for infection ============================================================ ============================================================  - PSA = 4.52 is better than         last PSA 8.82       last April at [place]                          ( will send report to Dr Lovena Neighbours) ============================================================ ============================================================  - Kidney functions  (   BUN, Creatinine  and GFR   ) still look a little dehydrated    Very important to drink adequate amounts of fluids to prevent permanent damage    - Recommend drink at least 6 bottles (16 ounces) of fluids /water /day = 96 Oz ~100 oz  - 100 oz = 3,000 cc or 3 liters / day  - >> That's 1 &1/2 bottles of a 2 liter soda bottle /day !  ============================================================ ============================================================   -  Total  Chol =  227   - Elevated             (  Ideal  or  Goal is less than 180  !  )   - and   -  Bad / Dangerous LDL  Chol = 116     - also Elevated              (  Ideal  or  Goal is less than 70  !  )   - Recommend a stricter low cholesterol diet   - Cholesterol only comes from animal sources  - ie. meat, dairy, egg yolks  - Eat all the vegetables you want.  - Avoid meat, especially red meat - Beef AND Pork .  - Avoid cheese & dairy - milk & ice cream.     - Cheese is the most  concentrated form of trans-fats which  is the worst thing to clog up our arteries.   - Veggie cheese is OK which can be found in the fresh  produce section at [employer] or [employer] or [employer] ============================================================ ============================================================  - A1c - Normal -No Diabetes - Great  ============================================================ ============================================================  - TSH is a little low  which means thyroid hormone may be elevated or too high   - Taking an over the counter Biotin supplement may also cause  incorrect thyroid measurements,                        So please stop any Biotin  supplements if you're taking any  - Suggest you call office to schedule a lab in about 1 month to recheck Thyroid  ============================================================ ============================================================  - Vitamin D= 74   -  Excellent  ============================================================ ============================================================  - All Else - CBC - Kidneys - Electrolytes - Liver - Magnesium & Thyroid    - all  Normal / OK ============================================================ ============================================================

## 2020-12-14 ENCOUNTER — Other Ambulatory Visit: Payer: Self-pay | Admitting: Internal Medicine

## 2020-12-14 LAB — URINE CULTURE
MICRO NUMBER:: 12555284
SPECIMEN QUALITY:: ADEQUATE

## 2020-12-14 MED ORDER — CEPHALEXIN 500 MG PO CAPS: ORAL_CAPSULE | ORAL | 0 refills | Status: DC

## 2020-12-14 NOTE — Progress Notes (Signed)
============================================================ ============================================================  -    Urine culture showed infection,   - So New Rx for generic Keflex sent to [facility]   - to take 4 x /day  with  meals & bedtime for 2 weeks

## 2021-01-21 ENCOUNTER — Other Ambulatory Visit: Payer: Self-pay | Admitting: Adult Health

## 2021-01-21 DIAGNOSIS — I4891 Unspecified atrial fibrillation: Secondary | ICD-10-CM

## 2021-01-23 ENCOUNTER — Encounter: Payer: Self-pay | Admitting: Internal Medicine

## 2021-01-29 ENCOUNTER — Encounter: Payer: Self-pay | Admitting: Internal Medicine

## 2021-02-11 ENCOUNTER — Ambulatory Visit (INDEPENDENT_AMBULATORY_CARE_PROVIDER_SITE_OTHER): Payer: Medicare Other

## 2021-02-11 ENCOUNTER — Other Ambulatory Visit: Payer: Self-pay

## 2021-02-11 DIAGNOSIS — S51819A Laceration without foreign body of unspecified forearm, initial encounter: Secondary | ICD-10-CM

## 2021-02-11 DIAGNOSIS — Z23 Encounter for immunization: Secondary | ICD-10-CM | POA: Diagnosis not present

## 2021-02-11 NOTE — Progress Notes (Signed)
Patient presents for a nurse visit today for Tdap injection for a laceration on his forearm and had a new great granddaughter.

## 2021-03-05 NOTE — Progress Notes (Signed)
Future Appointments  Date Time Provider Department  03/06/2021 11:30 AM [name], MD [facility]  06/11/2021 10:30 AM Magda Bernheim, NP [facility]  12/09/2021 11:00 AM [name], MD [facility]    History of Present Illness:     Patient is a very nice 88 yo WWM with HTN, HLD, Pre-Diabetes, ASHD /cAfib,  IBS  and Vitamin D Deficiency who  has hx/o Gleason 6 - Ca Prostate followed by active surveillance by Dr Gerald Stabs [facility]. In Oct U/C grew Staph epidermidis & as he was symptomatic he was treated empirically with a short course of Keflex and sx's apparently resolved. Patient has been on Tamsulosin & Finasteride with improvement in his LUTS. Patient also relates sx's suggestive of OAB & desires to try Logan Bores that his [place] GF takes for "Keyhole syndrome".  He now is again having sx's of urgency and nocturia preventing sleep.   Medications     diltiazem (CARDIZEM CD) 120 MG 24 hr capsule, TAKE 1 CAPSULE BY MOUTH EVERY DAY FOR BLOOD PRESSURE AND HEART RHYTHM.   flecainide (TAMBOCOR) 100 MG tablet, Take     1 tablet     2 x /day  (every 12 hours )        for Afib   olmesartan (BENICAR) 40 MG tablet, Take  1/2 to 1 tablet  at Night  for BP   Rivaroxaban (XARELTO) 15 MG TABS tablet, Take  1 tablet  Daily  to Prevent Blood Clots  from Atrial Fibrillation   Cholecalciferol (VITAMIN D-3) 5000 UNITS TABS, Take by mouth.   dicyclomine (BENTYL) 20 MG tablet, Take  1 tablet  3 to 4 x /day  before Meals & Bedtime as needed for Nausea, cramping, Bloating or Diarrhea   finasteride (PROSCAR) 5 MG tablet, Take  1 tablet  Daily  for Prostate   hyoscyamine (LEVSIN SL) 0.125 MG SL tablet, DISSOLVE 1 TO 2 TABLETS UNDER THE TONGUE FOUR TIMES DAILY AS NEEDED FOR ABDOMINAL CRAMPS OR NAUSEA OR DIARRHEA   LUTEIN PO, Take 5 mg by mouth 2 (two) times daily.   neomycin-polymyxin-hydrocortisone (CORTISPORIN) 3.5-10000-1 OTIC suspension, Place 4 drops into both ears 4 (four) times daily as  needed.   tamsulosin (FLOMAX) 0.4 MG CAPS capsule, Take 1 tablet at Bedtime for Prostate  Problem list He has Hyperlipidemia; Sinus bradycardia; Vitamin D deficiency; History of elevated glucose; Elevated prostate specific antigen (PSA); Labile hypertension; Medication management; Prostate cancer ([place]); Spinal stenosis; Persistent atrial fibrillation: CHA2DS2Vasc = 3 (Age & HTN); AVM (arteriovenous malformation); and Stage 3b chronic kidney disease (HCC) on their problem list.   Observations/Objective:  BP (!) 150/80    Pulse 78    Temp 97.9 F (36.6 C)    Resp 16    Ht 6\' 1"  (1.854 m)    Wt 207 lb 9.6 oz (94.2 kg)    SpO2 96%    BMI 27.39 kg/m   HEENT - WNL. Neck - supple.  Chest - Clear equal BS. Cor - Nl HS. RRR w/o sig MGR. PP 1(+). No edema. MS- FROM w/o deformities.  Gait Nl. Neuro -  Nl w/o focal abnormalities.   Assessment and Plan:   1. Dysuria  - Urinalysis, Routine w reflex microscopic - Urine Culture  2. Lower urinary tract symptoms (LUTS)  - Urinalysis, Routine w reflex microscopic - Urine Culture  3. Prostate cancer ([place])  - PSA  Follow Up Instructions:      Given 3 week samples of Gemtesa to  try for his LUTS.       I discussed the assessment and treatment plan with the patient. The patient was provided an opportunity to ask questions and all were answered. The patient agreed with the plan and demonstrated an understanding of the instructions.       The patient was advised to call back or seek an in-person evaluation if the symptoms worsen or if the condition fails to improve as anticipated.   Kirtland Bouchard, MD

## 2021-03-06 ENCOUNTER — Encounter: Payer: Self-pay | Admitting: Internal Medicine

## 2021-03-06 ENCOUNTER — Other Ambulatory Visit: Payer: Self-pay

## 2021-03-06 ENCOUNTER — Ambulatory Visit (INDEPENDENT_AMBULATORY_CARE_PROVIDER_SITE_OTHER): Payer: Medicare Other | Admitting: Internal Medicine

## 2021-03-06 VITALS — BP 150/80 | HR 78 | Temp 97.9°F | Resp 16 | Ht 73.0 in | Wt 207.6 lb

## 2021-03-06 DIAGNOSIS — R399 Unspecified symptoms and signs involving the genitourinary system: Secondary | ICD-10-CM | POA: Diagnosis not present

## 2021-03-06 DIAGNOSIS — R300 Dysuria: Secondary | ICD-10-CM

## 2021-03-06 DIAGNOSIS — C61 Malignant neoplasm of prostate: Secondary | ICD-10-CM | POA: Diagnosis not present

## 2021-03-06 MED ORDER — FLECAINIDE ACETATE 50 MG PO TABS: ORAL_TABLET | ORAL | 3 refills | Status: DC

## 2021-03-06 NOTE — Patient Instructions (Signed)

## 2021-03-07 LAB — URINALYSIS, ROUTINE W REFLEX MICROSCOPIC
Bacteria, UA: NONE SEEN /HPF
Bilirubin Urine: NEGATIVE
Glucose, UA: NEGATIVE
Hgb urine dipstick: NEGATIVE
Hyaline Cast: NONE SEEN /LPF
Ketones, ur: NEGATIVE
Leukocytes,Ua: NEGATIVE
Nitrite: NEGATIVE
RBC / HPF: NONE SEEN /HPF (ref 0–2)
Specific Gravity, Urine: 1.014 (ref 1.001–1.035)
Squamous Epithelial / HPF: NONE SEEN /HPF (ref <=5)
WBC, UA: NONE SEEN /HPF (ref 0–5)
pH: 6 (ref 5.0–8.0)

## 2021-03-07 LAB — URINE CULTURE
MICRO NUMBER:: 12892901
SPECIMEN QUALITY:: ADEQUATE

## 2021-03-07 LAB — PSA: PSA: 4.09 ng/mL — ABNORMAL HIGH (ref <=4.00)

## 2021-03-08 NOTE — Progress Notes (Signed)
============================================================ °-   Test results slightly outside the reference range are not unusual. If there is anything important, I will review this with you,  otherwise it is considered normal test values.  If you have further questions,  please do not hesitate to contact me at the office or via My Chart.  ============================================================ ============================================================  -  U/A is OK  - No Blood or WBC  - PSA is Stable - about the same   - Urine culture is Negative - No Antibiotics needed   ============================================================ ============================================================

## 2021-03-30 ENCOUNTER — Other Ambulatory Visit: Payer: Self-pay | Admitting: Internal Medicine

## 2021-03-30 ENCOUNTER — Encounter: Payer: Self-pay | Admitting: Internal Medicine

## 2021-03-30 MED ORDER — GEMTESA 75 MG PO TABS
ORAL_TABLET | ORAL | 3 refills | Status: DC
Start: 2021-03-30 — End: 2022-06-24

## 2021-03-30 MED ORDER — GEMTESA 75 MG PO TABS: ORAL_TABLET | ORAL | 11 refills | Status: DC

## 2021-06-11 ENCOUNTER — Encounter: Payer: Self-pay | Admitting: Nurse Practitioner

## 2021-06-11 ENCOUNTER — Ambulatory Visit (INDEPENDENT_AMBULATORY_CARE_PROVIDER_SITE_OTHER): Payer: Medicare Other | Admitting: Nurse Practitioner

## 2021-06-11 VITALS — BP 140/70 | HR 67 | Temp 97.9°F | Resp 16 | Ht 73.0 in | Wt 201.0 lb

## 2021-06-11 DIAGNOSIS — I4819 Other persistent atrial fibrillation: Secondary | ICD-10-CM

## 2021-06-11 DIAGNOSIS — E559 Vitamin D deficiency, unspecified: Secondary | ICD-10-CM

## 2021-06-11 DIAGNOSIS — E0590 Thyrotoxicosis, unspecified without thyrotoxic crisis or storm: Secondary | ICD-10-CM

## 2021-06-11 DIAGNOSIS — Z0000 Encounter for general adult medical examination without abnormal findings: Secondary | ICD-10-CM | POA: Diagnosis not present

## 2021-06-11 DIAGNOSIS — E782 Mixed hyperlipidemia: Secondary | ICD-10-CM

## 2021-06-11 DIAGNOSIS — Z0001 Encounter for general adult medical examination with abnormal findings: Secondary | ICD-10-CM | POA: Diagnosis not present

## 2021-06-11 DIAGNOSIS — C61 Malignant neoplasm of prostate: Secondary | ICD-10-CM

## 2021-06-11 DIAGNOSIS — R001 Bradycardia, unspecified: Secondary | ICD-10-CM

## 2021-06-11 DIAGNOSIS — N1832 Chronic kidney disease, stage 3b: Secondary | ICD-10-CM

## 2021-06-11 DIAGNOSIS — Z79899 Other long term (current) drug therapy: Secondary | ICD-10-CM

## 2021-06-11 DIAGNOSIS — R6889 Other general symptoms and signs: Secondary | ICD-10-CM

## 2021-06-11 DIAGNOSIS — I10 Essential (primary) hypertension: Secondary | ICD-10-CM

## 2021-06-11 DIAGNOSIS — R0989 Other specified symptoms and signs involving the circulatory and respiratory systems: Secondary | ICD-10-CM

## 2021-06-11 DIAGNOSIS — M4800 Spinal stenosis, site unspecified: Secondary | ICD-10-CM

## 2021-06-11 NOTE — Progress Notes (Signed)
MEDICARE ANNUAL WELLNESS VISIT AND FOLLOW UP ? ?Assessment:  ? ?Encounter for Medicare annual wellness exam ?1 year ? ?Labile hypertension ?-     CBC with Differential/Platelet ?-     COMPLETE METABOLIC PANEL WITH GFR ?-     TSH ?- continue medications, DASH diet, exercise and monitor at home. Call if greater than 130/80.  ? ?Mixed hyperlipidemia ?-     Lipid panel ?check lipids ?decrease fatty foods ?increase activity.  ? ?Prostate cancer ([place]) ?Continue to monitor ? ?Persistent atrial fibrillation: CHA2DS2Vasc = 3 (Age & HTN) ?Continue medications and cardio follow up NSR today ? ?History of elevated glucose ?Monitor ? ?Medication management ?-     Magnesium ? ?Vitamin D deficiency ?-     VITAMIN D 25 Hydroxy (Vit-D Deficiency, Fractures) ? ?AVM (arteriovenous malformation) ?Monitor ? ?Spinal stenosis, unspecified spinal region ?Continue PT ? ?Sinus bradycardia ?Monitor ? ?Atrial fibrillation, unspecified type (HCC) ?-     diltiazem (CARDIZEM CD) 120 MG 24 hr capsule; Take 1 capsule Daily for BP & Heart Rhythm ? ? ? ?Over 30 minutes of exam, counseling, chart review, and critical decision making was performed ? ?Future Appointments  ?Date Time Provider [place]  ?12/09/2021 11:00 AM [name], MD [facility] None  ?06/12/2022 11:00 AM Darrol Jump, NP [facility] None  ? ? ? ?Plan:  ? ?During the course of the visit the patient was educated and counseled about appropriate screening and preventive services including:  ? ?Pneumococcal vaccine  ?Influenza vaccine ?Prevnar 13 ?Td vaccine ?Screening electrocardiogram ?Colorectal cancer screening ?Diabetes screening ?Glaucoma screening ?Nutrition counseling  ? ? ?Subjective:  ?Eugene Friedman is a 88 y.o. [place] male who presents for Medicare Annual Wellness Visit and 3 month follow up for HTN, hyperlipidemia, prediabetes, and vitamin D Def.  ? ?He leaves 07/02/21 to visit his girlfriend in [place].   ? ?He is following with Dr. 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 Continues to do PT and will follow up for surveillance. ? ?He has persistent Afib x sept 2020, on flecainide, s/p failed cardioversion in 11/2018. He is on cardizem 120 mg daily.  ? ?Follows with Dr. Lovena Neighbours for history of prostate cancer x 2007, active surveillance.   ? ?He has IBS.  Denies any recent blood in stool, black stool. No nausea, no weight loss.  ? ?BMI is Body mass index is 26.52 kg/m?., he is working on diet and exercise. ?Wt Readings from Last 3 Encounters:  ?06/11/21 201 lb (91.2 kg)  ?03/06/21 207 lb 9.6 oz (94.2 kg)  ?12/09/20 201 lb 3.2 oz (91.3 kg)  ? ?His blood pressure has been controlled at home at home are under 120/60's, today their BP is BP: 140/70  ?BP Readings from Last 3 Encounters:  ?06/11/21 140/70  ?03/06/21 (!) 150/80  ?12/09/20 (!) 160/70  ? ? He does workout. He denies chest pain, shortness of breath, dizziness. ? He is on cholesterol medication and denies myalgias. His cholesterol is not at goal. The cholesterol last visit was:   ?Lab Results  ?Component Value Date  ? CHOL 246 (H) 06/11/2021  ? HDL 85 06/11/2021  ? LDLCALC 141 (H) 06/11/2021  ? TRIG 92 06/11/2021  ? CHOLHDL 2.9 06/11/2021  ? ? He has been working on diet and exercise for prediabetes, and denies paresthesia of the feet, polydipsia, polyuria and visual disturbances. Last A1C in the office was:  ?Lab Results  ?Component  Value Date  ? HGBA1C 5.2 12/09/2020  ? ?Last GFR ?Lab Results  ?Component Value Date  ? GFRNONAA 39 (L) 05/27/2020  ? ?Patient is on Vitamin D supplement.   ?Lab Results  ?Component Value Date  ? VD25OH 74 12/09/2020  ?   ? ?Medication Review: ? ? ?Current Outpatient Medications (Cardiovascular):  ?  diltiazem (CARDIZEM CD) 120 MG 24 hr capsule, TAKE 1 CAPSULE BY MOUTH EVERY DAY FOR BLOOD PRESSURE AND HEART RHYTHM. ?  flecainide (TAMBOCOR) 50 MG tablet, Take 1 & 1/2  tablet (75 mg)   2 x /day  (every 12 hours )        for Afib ?   olmesartan (BENICAR) 40 MG tablet, Take  1/2 to 1 tablet  at Night  for BP (Patient not taking: Reported on 03/06/2021) ? ? ? ?Current Outpatient Medications (Hematological):  ?  Rivaroxaban (XARELTO) 15 MG TABS tablet, Take  1 tablet  Daily  to Prevent Blood Clots  from Atrial Fibrillation ? ?Current Outpatient Medications (Other):  ?  Cholecalciferol (VITAMIN D-3) 5000 UNITS TABS, Take by mouth. ?  dicyclomine (BENTYL) 20 MG tablet, Take  1 tablet  3 to 4 x /day  before Meals & Bedtime as needed for Nausea, cramping, Bloating or Diarrhea ?  finasteride (PROSCAR) 5 MG tablet, Take  1 tablet  Daily  for Prostate ?  hyoscyamine (LEVSIN SL) 0.125 MG SL tablet, DISSOLVE 1 TO 2 TABLETS UNDER THE TONGUE FOUR TIMES DAILY AS NEEDED FOR ABDOMINAL CRAMPS OR NAUSEA OR DIARRHEA ?  LUTEIN PO, Take 5 mg by mouth 2 (two) times daily. ?  tamsulosin (FLOMAX) 0.4 MG CAPS capsule, Take 1 tablet at Bedtime for Prostate ?  Vibegron (GEMTESA) 75 MG TABS, Take  1 tablet  at Bedtime  for Bladder ?  neomycin-polymyxin-hydrocortisone (CORTISPORIN) 3.5-10000-1 OTIC suspension, Place 4 drops into both ears 4 (four) times daily as needed. (Patient not taking: Reported on 03/06/2021) ? ?Allergies: ?Allergies  ?Allergen Reactions  ? Celebrex [Celecoxib] Other (See Comments)  ?  GI upset  ? Lipitor [Atorvastatin]   ? Ppd [Tuberculin Purified Protein Derivative] Other (See Comments)  ?  Positive PPD  ? Adhesive [Tape] Rash  ?  Zinc in the band-aid will cause a rash  ? Nickel Rash  ? ? ?Current Problems (verified) ?has Hyperlipidemia; Sinus bradycardia; Vitamin D deficiency; History of elevated glucose; Elevated prostate specific antigen (PSA); Labile hypertension; Medication management; Prostate cancer ([place]); Spinal stenosis; Persistent atrial fibrillation: CHA2DS2Vasc = 3 (Age & HTN); AVM (arteriovenous malformation); and Stage 3b chronic kidney disease ([place]) on their problem list. ? ?Screening Tests ?Immunization History  ?Administered Date(s)  Administered  ? H1N1 02/17/2007  ? Influenza Whole 12/05/2012  ? Influenza, High Dose Seasonal PF 11/14/2013, 11/20/2014, 12/16/2015, 10/30/2016, 02/04/2018, 11/22/2018, 11/23/2019, 12/09/2020  ? Influenza-Unspecified 12/05/2012  ? PFIZER(Purple Top)SARS-COV-2 Vaccination 03/09/2019, 03/30/2019, 11/07/2019  ? Pneumococcal Conjugate-13 12/05/2014  ? Pneumococcal Polysaccharide-23 03/08/2008, 05/18/2013  ? Td 06/14/2019  ? Tdap 02/17/2008, 02/11/2021  ? Zoster, Live 02/16/2005  ? ?Preventative care: ?Last colonoscopy: more than 10 years ago, will not get another due to age ? ?Prior vaccinations: ?TD or Tdap: 2010 DUE ?Influenza: 2020 ?COVID vaccines completed 03/30/2019 ?Pneumococcal: 2015 ?FVCBSWH67: 2016 ?Shingles/Zostavax: 2007 ? ?Names of Other Physician/Practitioners you currently use: ?1. [facility] here for primary care ?[place], eye doctor, last visit 10/2017 ?3. Mariane Masters, dentist, last visit 2019 ?Patient Care Team: ?[name], MD as PCP - General (Internal Medicine) ?Bing Plume,  Elenore Rota, MD as Consulting Physician (Ophthalmology) ?Nahser, Wonda Cheng, MD as Consulting Physician (Cardiology) ?Armandina Gemma, MD as Consulting Physician (General Surgery) ?Garald Balding, MD as Consulting Physician (Orthopedic Surgery) ? ?Surgical: ?He  has a past surgical history that includes cervical neck mass  (07/08/06); Appendectomy; Mastoidectomy (Left); Flexible sigmoidoscopy; Knee surgery; Hemorrhoid surgery; Joint replacement; and Back Procedure (11/02/2018). ?Family ?His family history includes Cancer in his maternal grandmother; Diabetes in his maternal aunt, mother, and paternal aunt; Heart attack (age of onset: 87) in his father; Hypertension in his father; Stroke in his mother. ?Social history  ?He reports that he has never smoked. He has never used smokeless tobacco. He reports current alcohol use of about 10.0 standard drinks per week. He reports that he does not use  drugs. ? ?MEDICARE WELLNESS OBJECTIVES: ?Physical activity:   ?Cardiac risk factors:   ?Depression/mood screen:   ? ?  06/11/2021  ? 10:59 AM  ?Depression screen PHQ 2/9  ?Decreased Interest 0  ?Down, Depressed, Hopeless 0

## 2021-06-12 LAB — COMPLETE METABOLIC PANEL WITH GFR
AG Ratio: 1.8 (calc) (ref 1.0–2.5)
ALT: 20 U/L (ref 9–46)
AST: 20 U/L (ref 10–35)
Albumin: 4.3 g/dL (ref 3.6–5.1)
Alkaline phosphatase (APISO): 81 U/L (ref 35–144)
BUN/Creatinine Ratio: 21 (calc) (ref 6–22)
BUN: 31 mg/dL — ABNORMAL HIGH (ref 7–25)
CO2: 26 mmol/L (ref 20–32)
Calcium: 9.5 mg/dL (ref 8.6–10.3)
Chloride: 103 mmol/L (ref 98–110)
Creat: 1.48 mg/dL — ABNORMAL HIGH (ref 0.70–1.22)
Globulin: 2.4 g/dL (calc) (ref 1.9–3.7)
Glucose, Bld: 88 mg/dL (ref 65–99)
Potassium: 4.8 mmol/L (ref 3.5–5.3)
Sodium: 138 mmol/L (ref 135–146)
Total Bilirubin: 0.7 mg/dL (ref 0.2–1.2)
Total Protein: 6.7 g/dL (ref 6.1–8.1)
eGFR: 45 mL/min/{1.73_m2} — ABNORMAL LOW (ref >=60)

## 2021-06-12 LAB — CBC WITH DIFFERENTIAL/PLATELET
Absolute Monocytes: 504 cells/uL (ref 200–950)
Basophils Absolute: 42 cells/uL (ref 0–200)
Basophils Relative: 0.8 %
Eosinophils Absolute: 80 cells/uL (ref 15–500)
Eosinophils Relative: 1.5 %
HCT: 40.9 % (ref 38.5–50.0)
Hemoglobin: 13.9 g/dL (ref 13.2–17.1)
Lymphs Abs: 1034 cells/uL (ref 850–3900)
MCH: 31.1 pg (ref 27.0–33.0)
MCHC: 34 g/dL (ref 32.0–36.0)
MCV: 91.5 fL (ref 80.0–100.0)
MPV: 9.2 fL (ref 7.5–12.5)
Monocytes Relative: 9.5 %
Neutro Abs: 3641 cells/uL (ref 1500–7800)
Neutrophils Relative %: 68.7 %
Platelets: 260 10*3/uL (ref 140–400)
RBC: 4.47 10*6/uL (ref 4.20–5.80)
RDW: 13.1 % (ref 11.0–15.0)
Total Lymphocyte: 19.5 %
WBC: 5.3 10*3/uL (ref 3.8–10.8)

## 2021-06-12 LAB — URINALYSIS, ROUTINE W REFLEX MICROSCOPIC
Bilirubin Urine: NEGATIVE
Glucose, UA: NEGATIVE
Hgb urine dipstick: NEGATIVE
Ketones, ur: NEGATIVE
Leukocytes,Ua: NEGATIVE
Nitrite: NEGATIVE
Protein, ur: NEGATIVE
Specific Gravity, Urine: 1.015 (ref 1.001–1.035)
pH: 6 (ref 5.0–8.0)

## 2021-06-12 LAB — TSH: TSH: 3.51 mIU/L (ref 0.40–4.50)

## 2021-06-12 LAB — LIPID PANEL
Cholesterol: 246 mg/dL — ABNORMAL HIGH (ref <200)
HDL: 85 mg/dL (ref >=40)
LDL Cholesterol (Calc): 141 mg/dL (calc) — ABNORMAL HIGH
Non-HDL Cholesterol (Calc): 161 mg/dL (calc) — ABNORMAL HIGH (ref <130)
Total CHOL/HDL Ratio: 2.9 (calc) (ref <5.0)
Triglycerides: 92 mg/dL (ref <150)

## 2021-06-12 LAB — PSA: PSA: 4.52 ng/mL — ABNORMAL HIGH (ref <=4.00)

## 2021-06-23 ENCOUNTER — Other Ambulatory Visit: Payer: Self-pay | Admitting: Internal Medicine

## 2021-06-23 DIAGNOSIS — I4891 Unspecified atrial fibrillation: Secondary | ICD-10-CM

## 2021-06-25 ENCOUNTER — Other Ambulatory Visit: Payer: Self-pay | Admitting: Internal Medicine

## 2021-06-25 MED ORDER — AZITHROMYCIN 250 MG PO TABS: ORAL_TABLET | ORAL | 1 refills | Status: DC

## 2021-06-25 MED ORDER — AMOXICILLIN 250 MG PO CAPS: ORAL_CAPSULE | ORAL | 0 refills | Status: DC

## 2021-06-25 MED ORDER — SULFAMETHOXAZOLE-TRIMETHOPRIM 800-160 MG PO TABS: ORAL_TABLET | ORAL | 0 refills | Status: DC

## 2021-06-26 ENCOUNTER — Telehealth: Payer: Self-pay | Admitting: Nurse Practitioner

## 2021-06-26 NOTE — Telephone Encounter (Signed)
Pt is wanting to try the low dose statin medication again to help with his Cholesterol  ?

## 2021-06-27 ENCOUNTER — Other Ambulatory Visit: Payer: Self-pay | Admitting: Nurse Practitioner

## 2021-06-27 MED ORDER — ROSUVASTATIN CALCIUM 10 MG PO TABS: 5.0000 mg | ORAL_TABLET | Freq: Every day | ORAL | 0 refills | Status: DC

## 2021-08-31 ENCOUNTER — Other Ambulatory Visit: Payer: Self-pay | Admitting: Adult Health

## 2021-08-31 DIAGNOSIS — I4891 Unspecified atrial fibrillation: Secondary | ICD-10-CM

## 2021-11-05 ENCOUNTER — Other Ambulatory Visit: Payer: Self-pay

## 2021-11-05 MED ORDER — FINASTERIDE 5 MG PO TABS: ORAL_TABLET | ORAL | 0 refills | Status: DC

## 2021-11-14 ENCOUNTER — Other Ambulatory Visit: Payer: Self-pay | Admitting: Internal Medicine

## 2021-11-14 DIAGNOSIS — K582 Mixed irritable bowel syndrome: Secondary | ICD-10-CM

## 2021-11-19 NOTE — Progress Notes (Signed)
Future Appointments  Date Time Provider Department  11/20/2021  4:30 PM [name], MD [facility]  12/09/2021 11:00 AM [name], MD [facility]  06/12/2022 11:00 AM Darrol Jump, NP [facility]    History of Present Illness:      Patient is a very nice 88 yo WWM with HTN, HLD, Pre-Diabetes, ASHD /cAfib,  IBS,  Ca Prostate   and Vitamin D Deficiency who  presents with c/o LBP exacerbated by position, lifting & bending . No sciatica.   Medications     diltiazem (CARDIZEM CD) 120 MG 2, Take  1 capsule  Daily     flecainide (TAMBOCOR) 50 MG tablet, Take 1 & 1/2  tablet (75 mg)   2 x /day  (every 12 hours )        for Afib   rosuvastatin 10 MG tablet, Take 0.5 tablets  daily.   XARELTO 15 MG TABS tablet, TAKE 1 TABLET DAILY    amoxicillin 250 MG capsule, Take  1 capsule  3 x /day  with Meals for Gum or Dental  Infection   VITAMIN D  5000 UNITS TABS, Take daily   dicyclomine 20 MG tablet, TAKE 1 TAB  3 TO 4 TIMES / DAY BEFORE MEALS AND BEDTIME    finasteride  5 MG tablet, Take 1 tablet Daily   hyoscyamine SL) 0.125 MG SL, DISSOLVE 1 TO 2 TABLETS UNDER TONGUE 4x/day AS NEEDED FOR ABDOMINAL CRAMPS OR NAUSEA OR DIARRHEA   LUTEIN  5 mg,  Take  2  times daily.   tamsulosin 0.4 MG CAPS , Take 1 tablet at Bedtime    Vibegron (GEMTESA) 75 MG TABS, Take  1 tablet  at Bedtime  Problem list He has Hyperlipidemia; Sinus bradycardia; Vitamin D deficiency; History of elevated glucose; Elevated prostate specific antigen (PSA); Labile hypertension; Medication management; Prostate cancer ([place]); Spinal stenosis; Persistent atrial fibrillation: CHA2DS2Vasc = 3 (Age & HTN); AVM (arteriovenous malformation); and Stage 3b chronic kidney disease (HCC) on their problem list.   Observations/Objective:  BP 130/70   Pulse 63   Temp 97.6 F (36.4 C)   Resp 16   Ht '6\' 1"'$  (1.854 m)   Wt 198 lb (89.8 kg)   SpO2 94%   BMI 26.12 kg/m   HEENT - WNL. Neck - supple.  Chest - Clear equal  BS. Cor - Nl HS. RRR w/o sig MGR. PP 1(+). No edema. MS- FROM w/o deformities.  (+) tender Rt>Lt para-lumbar spasm.  Gait Nl. Neuro -  Nl w/o focal abnormalities.   Assessment and Plan:   1. Bilateral low back pain without sciatica  - cyclobenzaprine 10 MG tablet;  Take 1/2 to 1 tablet 3 x /day as needed for Muscle Spasm   Dispense: 90 tablet  - dexamethasone 4 MG tablet;  Take 1 tab 3 x day - 3 days, then 2 x day - 3 days, then 1 tab  Daily      Dispense: 20 tablet  - Discussed stretching exercises & back mechanics  2. Need for immunization against influenza  - Flu vaccine HIGH DOSE PF (Fluzone High dose)   Follow Up Instructions:        I discussed the assessment and treatment plan with the patient. The patient was provided an opportunity to ask questions and all were answered. The patient agreed with the plan and demonstrated an understanding of the instructions.       The patient was advised to call back or seek an  in-person evaluation if the symptoms worsen or if the condition fails to improve as anticipated.   Kirtland Bouchard, MD

## 2021-11-20 ENCOUNTER — Ambulatory Visit (INDEPENDENT_AMBULATORY_CARE_PROVIDER_SITE_OTHER): Payer: Medicare Other | Admitting: Internal Medicine

## 2021-11-20 ENCOUNTER — Encounter: Payer: Self-pay | Admitting: Internal Medicine

## 2021-11-20 VITALS — BP 130/70 | HR 63 | Temp 97.6°F | Resp 16 | Ht 73.0 in | Wt 198.0 lb

## 2021-11-20 DIAGNOSIS — M545 Low back pain, unspecified: Secondary | ICD-10-CM

## 2021-11-20 DIAGNOSIS — Z23 Encounter for immunization: Secondary | ICD-10-CM | POA: Diagnosis not present

## 2021-11-20 MED ORDER — DEXAMETHASONE 4 MG PO TABS: ORAL_TABLET | ORAL | 0 refills | Status: DC

## 2021-11-20 MED ORDER — CYCLOBENZAPRINE HCL 10 MG PO TABS: ORAL_TABLET | ORAL | 0 refills | Status: DC

## 2021-11-22 ENCOUNTER — Encounter: Payer: Self-pay | Admitting: Internal Medicine

## 2021-11-22 NOTE — Patient Instructions (Signed)
Low Back Sprain or Strain Rehab Ask your health care provider which exercises are safe for you. Do exercises exactly as told by your health care provider and adjust them as directed. It is normal to feel mild stretching, pulling, tightness, or discomfort as you do these exercises. Stop right away if you feel sudden pain or your pain gets worse. Do not begin these exercises until told by your health care provider. Stretching and range-of-motion exercises These exercises warm up your muscles and joints and improve the movement and flexibility of your back. These exercises also help to relieve pain, numbness, and tingling. Lumbar rotation  Lie on your back on a firm bed or the floor with your knees bent. Straighten your arms out to your sides so each arm forms a 90-degree angle (right angle) with a side of your body. Slowly move (rotate) both of your knees to one side of your body until you feel a stretch in your lower back (lumbar). Try not to let your shoulders lift off the floor. Hold this position for _____30_____ seconds. Tense your abdominal muscles and slowly move your knees back to the starting position. Repeat this exercise on the other side of your body. Repeat _____10_____ times. Complete this exercise _____5-6_____ times a day. Single knee to chest  Lie on your back on a firm bed or the floor with both legs straight. Bend one of your knees. Use your hands to move your knee up toward your chest until you feel a gentle stretch in your lower back and buttock. Hold your leg in this position by holding on to the front of your knee. Keep your other leg as straight as possible. Hold this position for ____30______ seconds. Slowly return to the starting position. Repeat with your other leg. Repeat ____10______ times. Complete this exercise _____5-6_____ times a day. Prone extension on elbows  Lie on your abdomen on a firm bed or the floor (prone position). Prop yourself up on your  elbows. Use your arms to help lift your chest up until you feel a gentle stretch in your abdomen and your lower back. This will place some of your body weight on your elbows. If this is uncomfortable, try stacking pillows under your chest. Your hips should stay down, against the surface that you are lying on. Keep your hip and back muscles relaxed. Hold this position for ___30_______ seconds. Slowly relax your upper body and return to the starting position. Repeat _____10_____ times. Complete this exercise ____5-6______ times a day. Strengthening exercises These exercises build strength and endurance in your back. Endurance is the ability to use your muscles for a long time, even after they get tired. Pelvic tilt This exercise strengthens the muscles that lie deep in the abdomen. Lie on your back on a firm bed or the floor with your legs extended. Bend your knees so they are pointing toward the ceiling and your feet are flat on the floor. Tighten your lower abdominal muscles to press your lower back against the floor. This motion will tilt your pelvis so your tailbone points up toward the ceiling instead of pointing to your feet or the floor. To help with this exercise, you may place a small towel under your lower back and try to push your back into the towel. Hold this position for _____30_____ seconds. Let your muscles relax completely before you repeat this exercise. Repeat ____10_____ times. Complete this exercise _____5-6_____ times a day. Alternating arm and leg raises  Get on your hands  and knees on a firm surface. If you are on a hard floor, you may want to use padding, such as an exercise mat, to cushion your knees. Line up your arms and legs. Your hands should be directly below your shoulders, and your knees should be directly below your hips. Lift your left leg behind you. At the same time, raise your right arm and straighten it in front of you. Do not lift your leg higher than your  hip. Do not lift your arm higher than your shoulder. Keep your abdominal and back muscles tight. Keep your hips facing the ground. Do not arch your back. Keep your balance carefully, and do not hold your breath. Hold this position for ____30______ seconds. Slowly return to the starting position. Repeat with your right leg and your left arm. Repeat ____10______ times. Complete this exercise ______5-6____ times a day. Abdominal set with straight leg raise  Lie on your back on a firm bed or the floor. Bend one of your knees and keep your other leg straight. Tense your abdominal muscles and lift your straight leg up, 4-6 inches (10-15 cm) off the ground. Keep your abdominal muscles tight and hold this position for _____30_____ seconds. Do not hold your breath. Do not arch your back. Keep it flat against the ground. Keep your abdominal muscles tense as you slowly lower your leg back to the starting position. Repeat with your other leg. Repeat ____10______ times. Complete this exercise _____5-6_____ times a day. Single leg lower with bent knees Lie on your back on a firm bed or the floor. Tense your abdominal muscles and lift your feet off the floor, one foot at a time, so your knees and hips are bent in 90-degree angles (right angles). Your knees should be over your hips and your lower legs should be parallel to the floor. Keeping your abdominal muscles tense and your knee bent, slowly lower one of your legs so your toe touches the ground. Lift your leg back up to return to the starting position. Do not hold your breath. Do not let your back arch. Keep your back flat against the ground. Repeat with your other leg. Repeat ___10_______ times. Complete this exercise _______5-6___ times a day. Posture and body mechanics Good posture and healthy body mechanics can help to relieve stress in your body's tissues and joints. Body mechanics refers to the movements and positions of your body while  you do your daily activities. Posture is part of body mechanics. Good posture means: Your spine is in its natural S-curve position (neutral). Your shoulders are pulled back slightly. Your head is not tipped forward (neutral). Follow these guidelines to improve your posture and body mechanics in your everyday activities. Standing  When standing, keep your spine neutral and your feet about hip-width apart. Keep a slight bend in your knees. Your ears, shoulders, and hips should line up. When you do a task in which you stand in one place for a long time, place one foot up on a stable object that is 2-4 inches (5-10 cm) high, such as a footstool. This helps keep your spine neutral. Sitting  When sitting, keep your spine neutral and keep your feet flat on the floor. Use a footrest, if necessary, and keep your thighs parallel to the floor. Avoid rounding your shoulders, and avoid tilting your head forward. When working at a desk or a computer, keep your desk at a height where your hands are slightly lower than your elbows. Slide your  chair under your desk so you are close enough to maintain good posture. When working at a computer, place your monitor at a height where you are looking straight ahead and you do not have to tilt your head forward or downward to look at the screen. Resting When lying down and resting, avoid positions that are most painful for you. If you have pain with activities such as sitting, bending, stooping, or squatting, lie in a position in which your body does not bend very much. For example, avoid curling up on your side with your arms and knees near your chest (fetal position). If you have pain with activities such as standing for a long time or reaching with your arms, lie with your spine in a neutral position and bend your knees slightly. Try the following positions: Lying on your side with a pillow between your knees. Lying on your back with a pillow under your  knees. Lifting  When lifting objects, keep your feet at least shoulder-width apart and tighten your abdominal muscles. Bend your knees and hips and keep your spine neutral. It is important to lift using the strength of your legs, not your back. Do not lock your knees straight out. Always ask for help to lift heavy or awkward objects. This information is not intended to replace advice given to you by your health care provider. Make sure you discuss any questions you have with your health care provider. Document Revised: 04/22/2020 Document Reviewed: 04/22/2020 Elsevier Patient Education  [place].

## 2021-12-08 NOTE — Progress Notes (Signed)
Annual  Screening/Preventative Visit  & Comprehensive Evaluation & Examination  Future Appointments  Date Time Provider Department  12/09/2021                    cpe 11:00 AM [name], MD [facility]  06/12/2022                     wellness 11:00 AM Darrol Jump, NP [facility]  12/15/2022                    cpe 11:00 AM [name], MD [facility]         This very nice 88 y.o. WWM with HTN, HLD, Pre-Diabetes, ASHD /cAfib,  IBS  and Vitamin D Deficiency  presents for a comprehensive evaluation and management of multiple medical co-morbidities.   Patient has hx/o Gleason 6 Prostate Ca followed by Dr Lovena Neighbours (active surveillance) .                                                   Patient has hx/o a lumbar myelo-neuropathy and is currently followed at 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 On March 15, he had 2sd vascular procedure for embolization of the AVM spinal cord fistula.         Patient's HTN predates since  2001. Patient's BP has been controlled at home.  Today's BP is at goal -  140/82.  Patient had a Negative Cardiolite in 2011 and Negative ETT in 2017.  Then he was dx'd in Sept 2020 with Afib (Sept 2020) (CHADsVASc = 3) and he was started on Xarelto.   He failed CV x 2 at [facility] in Oct & Dec 2020. Dr's at [facility]  advised  tapering his Xarelto dose from 20 to 15 mg based on age & renal functions.  Patient has CKD3a near 3b (GFR 45).  Patient's local Cardiologist is Dr Ellyn Hack. Patient denies any cardiac symptoms as chest pain, palpitations, shortness of breath, dizziness or ankle swelling.       Patient's hyperlipidemia is  not controlled with diet and he is statin intolerant. Patient denies myalgias or other medication SE's. Last lipids were not at goal :  Lab Results  Component Value Date   CHOL 246 (H) 06/11/2021   HDL 85 06/11/2021   LDLCALC 141 (H) 06/11/2021   TRIG 92 06/11/2021   CHOLHDL 2.9  06/11/2021         Patient has hx/o prediabetes (A1c 6.1% /2014) and patient denies reactive hypoglycemic symptoms, visual blurring, diabetic polys or paresthesias. Last A1c was normal & at goal :  Lab Results  Component Value Date   HGBA1C 5.2 12/09/2020         Finally, patient has history of Vitamin D Deficiency ("42" /2009) and last vitamin D was at goal :   Lab Results  Component Value Date   VD25OH 74 12/09/2020       Current Outpatient Medications  Medication Instructions   amoxicillin 250 MG capsule Take  1 capsule  3 x /day  with Meals for Gum or Dental  Infection   VITAMIN D   5000 UNITS  Take daily   cyclobenzaprine 10 MG tablet Take 1/2 to 1 tablet 3 x /day as needed    dicyclomine 20 MG  tablet TAKE 1 TABLET 3 TO 4 TIMES PER DAY    diltiazem CD 120 MG  Take  1 capsule  Daily  with Food    finasteride 5 MG tablet Take 1 tablet Daily for Prostate   flecainide 50 MG tablet Take 1 & 1/2  tablet  2 x /day     hyoscyamine  SL 0.125 MG SL tablet DISSOLVE 1 TO 2 TABLETS SL  4 x /day as needed   LUTEIN  5 mg, Oral, 2 times daily   tamsulosin 0.4 MG CAPS capsule Take 1 tablet at Bedtime    Vibegron (GEMTESA) 75 MG TABS Take  1 tablet  at Bedtime  f   XARELTO 15 MG TABS tablet TAKE 1 TABLET  DAILY     Allergies  Allergen Reactions   Celebrex [Celecoxib] Other (See Comments)    GI upset   Lipitor [Atorvastatin]    Ppd [Tuberculin Purified Protein Derivative] Other (See Comments)    Positive PPD   Adhesive [Tape] Rash    Zinc in the band-aid will cause a rash   Nickel Rash     Past Medical History:  Diagnosis Date   Allergic rhinitis    Atrial fibrillation by electrocardiogram ([place]) 10/2018   Was started on calcium channel blocker, beta-blocker and Xarelto   Cancer ([place])    prostate   DJD (degenerative joint disease)    Hypertension    Borderline    Sinus bradycardia    Spinal stenosis    Complicated by dural AVM   Vitamin D deficiency      Health  Maintenance  Topic Date Due   Zoster Vaccines- Shingrix (1 of 2) Never done   COVID-19 Vaccine (4 - Booster for Pfizer series) 01/02/2020   INFLUENZA VACCINE  09/16/2020   TETANUS/TDAP  06/13/2029   Pneumonia Vaccine 23+ Years old  Completed   HPV VACCINES  Aged Out     Immunization History  Administered Date(s) Administered   H1N1 02/17/2007   Influenza  12/05/2012   Influenza, High Dose  10/30/2016, 02/04/2018, 11/22/2018, 11/23/2019   PFIZER SARS-COV-2 Vacc 03/09/2019, 03/30/2019, 11/07/2019   Pneumococcal - 13 12/05/2014   Pneumococcal - 25 03/08/2008, 05/18/2013   Td 06/14/2019   Tdap 02/17/2008   Zoster, Live 02/16/2005    Last Colon - aged out  Past Surgical History:  Procedure Laterality Date   APPENDECTOMY     Back Procedure  11/02/2018   [facility] -vascular neuroradiology occlusion of dural AVM   cervical neck mass   07/08/06   Left Excision left posterior cervical neck mass (2.5 cm). 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 Does c/o fatigue. Eyes: Denies redness, blurred vision, diplopia, discharge, itchy  or watery eyes.  ENT: Denies discharge, congestion, post nasal drip, epistaxis, sore throat, earache, hearing loss, dental pain, Tinnitus, Vertigo, Sinus pain or snoring.  Cardio: Denies chest pain, palpitations, irregular heartbeat, syncope, dyspnea, diaphoresis, orthopnea, PND, claudication or edema Respiratory: denies cough, dyspnea, DOE, pleurisy, hoarseness, laryngitis or wheezing.  Gastrointestinal: Denies dysphagia, heartburn, reflux, water brash, pain, cramps, nausea, vomiting, bloating, diarrhea, constipation, hematemesis, melena, hematochezia, jaundice or hemorrhoids Genitourinary: Denies dysuria, frequency, discharge, hematuria or flank pain. Has urgency, nocturia x 2-3 & occasional hesitancy. Musculoskeletal: Denies arthralgia, myalgia, stiffness, Jt. Swelling, pain, limp or strain/sprain. Denies Falls. Skin: Denies puritis, rash, hives, warts, acne, eczema or change in skin lesion Neuro: No weakness, tremor, incoordination, spasms, paresthesia or pain Psychiatric: Denies confusion, memory loss or sensory loss. Denies Depression. Endocrine: Denies change in weight, skin, hair change, nocturia, and paresthesia, diabetic polys, visual blurring or hyper / hypo glycemic episodes.  Heme/Lymph: No excessive bleeding, bruising or enlarged lymph nodes.   Physical Exam  BP (!) 140/82   Pulse 78   Temp 97.9 F (36.6 C)   Resp 17   Ht '6\' 1"'$  (1.854 m)   Wt 201 lb 9.6 oz (91.4 kg)   SpO2 96%   BMI 26.60 kg/m   General Appearance: Well nourished and well groomed and in no apparent distress.  Eyes: PERRLA, EOMs, conjunctiva no swelling or erythema, normal fundi and vessels. Sinuses: No frontal/maxillary tenderness ENT/Mouth: EACs patent / TMs  nl. Nares clear without erythema, swelling, mucoid exudates. Oral hygiene is good. No erythema, swelling, or exudate. Tongue normal, non-obstructing. Tonsils not swollen or erythematous. Hearing normal.  Neck: Supple, thyroid not palpable. No  bruits, nodes or JVD. Respiratory: Respiratory effort normal.  BS equal and clear bilateral without rales, rhonci, wheezing or stridor. Cardio: Heart sounds are normal with regular rate and rhythm and no murmurs, rubs or gallops. Peripheral pulses are normal and equal bilaterally without edema. No aortic or femoral bruits. Chest: symmetric with normal excursions and percussion.  Abdomen: Soft, with Nl bowel sounds. Nontender, no guarding, rebound, hernias, masses, or organomegaly.  Lymphatics: Non tender without lymphadenopathy.  Musculoskeletal: Full ROM all peripheral extremities, joint stability, 5/5 strength, and sl broad based shuffling gait. Skin: Warm and dry without rashes, lesions, cyanosis, clubbing or  ecchymosis.  Neuro: Cranial nerves intact, reflexes equal bilaterally. Normal muscle tone, no cerebellar symptoms. Sensation intact.  Pysch: Alert and oriented X 3 with normal affect, insight and judgment appropriate.   Assessment and Plan  1. Essential hypertension  - EKG 12-Lead - [place], RETROPERITNL ABD,  LTD - Urinalysis, Routine w reflex microscopic - Microalbumin / creatinine urine ratio - CBC with Differential/Platelet - COMPLETE METABOLIC PANEL WITH GFR - Magnesium - TSH  2. Hyperlipidemia, mixed  - EKG 12-Lead - [place], RETROPERITNL ABD,  LTD - Lipid panel - TSH  3. Abnormal glucose  - EKG 12-Lead - [place], RETROPERITNL ABD,  LTD - Hemoglobin A1c - Insulin, random  4. Vitamin D deficiency  - VITAMIN D 25 Hydroxy  5. Chronic atrial fibrillation (HCC)  - EKG 12-Lead - [place], RETROPERITNL ABD,  LTD - TSH  6. Stage  3a chronic kidney disease (CKD) (HCC)  - EKG 12-Lead - [place], RETROPERITNL ABD,  LTD - Urinalysis, Routine w reflex microscopic - Microalbumin / creatinine urine ratio - PTH, intact and calcium  7. History of prostate cancer  - PSA  8. Screening for prostate cancer  - PSA  9. Screening for colorectal cancer  - POC Hemoccult Bld/Stl   10.  Screening for heart disease  - EKG 12-Lead  11. FHx: heart disease  - EKG 12-Lead - [place], RETROPERITNL ABD,  LTD  12. Screening for AAA (aortic abdominal aneurysm)  - [place], RETROPERITNL ABD,  LTD  13. Medication management  - Urinalysis, Routine w reflex microscopic - Microalbumin / creatinine urine ratio - CBC with Differential/Platelet - COMPLETE METABOLIC PANEL WITH GFR - Magnesium - Lipid panel - TSH - Hemoglobin A1c - Insulin, random - VITAMIN D 25 Hydroxy          Patient was counseled in prudent diet, weight control to achieve/maintain BMI less than 25, BP monitoring, regular exercise and medications as discussed.  Discussed med effects and SE's. Routine screening labs and tests as requested with regular follow-up as recommended. Over 40 minutes of exam, counseling, chart review and high complex critical decision making was performed   Kirtland Bouchard, MD

## 2021-12-08 NOTE — Patient Instructions (Signed)

## 2021-12-09 ENCOUNTER — Ambulatory Visit (INDEPENDENT_AMBULATORY_CARE_PROVIDER_SITE_OTHER): Payer: Medicare Other | Admitting: Internal Medicine

## 2021-12-09 ENCOUNTER — Encounter: Payer: Self-pay | Admitting: Internal Medicine

## 2021-12-09 VITALS — BP 140/82 | HR 78 | Temp 97.9°F | Resp 17 | Ht 73.0 in | Wt 201.6 lb

## 2021-12-09 DIAGNOSIS — I10 Essential (primary) hypertension: Secondary | ICD-10-CM

## 2021-12-09 DIAGNOSIS — E782 Mixed hyperlipidemia: Secondary | ICD-10-CM

## 2021-12-09 DIAGNOSIS — Z8546 Personal history of malignant neoplasm of prostate: Secondary | ICD-10-CM

## 2021-12-09 DIAGNOSIS — R7309 Other abnormal glucose: Secondary | ICD-10-CM

## 2021-12-09 DIAGNOSIS — I4891 Unspecified atrial fibrillation: Secondary | ICD-10-CM

## 2021-12-09 DIAGNOSIS — Z1211 Encounter for screening for malignant neoplasm of colon: Secondary | ICD-10-CM

## 2021-12-09 DIAGNOSIS — Z79899 Other long term (current) drug therapy: Secondary | ICD-10-CM

## 2021-12-09 DIAGNOSIS — Z125 Encounter for screening for malignant neoplasm of prostate: Secondary | ICD-10-CM

## 2021-12-09 DIAGNOSIS — N1831 Chronic kidney disease, stage 3a: Secondary | ICD-10-CM

## 2021-12-09 DIAGNOSIS — I700 Atherosclerosis of aorta: Secondary | ICD-10-CM | POA: Diagnosis not present

## 2021-12-09 DIAGNOSIS — Z136 Encounter for screening for cardiovascular disorders: Secondary | ICD-10-CM

## 2021-12-09 DIAGNOSIS — I482 Chronic atrial fibrillation, unspecified: Secondary | ICD-10-CM

## 2021-12-09 DIAGNOSIS — Z8249 Family history of ischemic heart disease and other diseases of the circulatory system: Secondary | ICD-10-CM

## 2021-12-09 DIAGNOSIS — E559 Vitamin D deficiency, unspecified: Secondary | ICD-10-CM

## 2021-12-09 MED ORDER — DILTIAZEM HCL ER COATED BEADS 120 MG PO CP24: ORAL_CAPSULE | ORAL | 3 refills | Status: DC

## 2021-12-10 ENCOUNTER — Other Ambulatory Visit: Payer: Self-pay | Admitting: Internal Medicine

## 2021-12-10 LAB — MICROALBUMIN / CREATININE URINE RATIO
Creatinine, Urine: 85 mg/dL (ref 20–320)
Microalb Creat Ratio: 52 mcg/mg creat — ABNORMAL HIGH (ref <30)
Microalb, Ur: 4.4 mg/dL

## 2021-12-10 LAB — COMPLETE METABOLIC PANEL WITH GFR
AG Ratio: 1.8 (calc) (ref 1.0–2.5)
ALT: 19 U/L (ref 9–46)
AST: 19 U/L (ref 10–35)
Albumin: 4.4 g/dL (ref 3.6–5.1)
Alkaline phosphatase (APISO): 102 U/L (ref 35–144)
BUN/Creatinine Ratio: 17 (calc) (ref 6–22)
BUN: 26 mg/dL — ABNORMAL HIGH (ref 7–25)
CO2: 24 mmol/L (ref 20–32)
Calcium: 9.2 mg/dL (ref 8.6–10.3)
Chloride: 104 mmol/L (ref 98–110)
Creat: 1.54 mg/dL — ABNORMAL HIGH (ref 0.70–1.22)
Globulin: 2.5 g/dL (calc) (ref 1.9–3.7)
Glucose, Bld: 87 mg/dL (ref 65–99)
Potassium: 5 mmol/L (ref 3.5–5.3)
Sodium: 138 mmol/L (ref 135–146)
Total Bilirubin: 0.5 mg/dL (ref 0.2–1.2)
Total Protein: 6.9 g/dL (ref 6.1–8.1)
eGFR: 42 mL/min/{1.73_m2} — ABNORMAL LOW (ref >=60)

## 2021-12-10 LAB — CBC WITH DIFFERENTIAL/PLATELET
Absolute Monocytes: 475 cells/uL (ref 200–950)
Basophils Absolute: 32 cells/uL (ref 0–200)
Basophils Relative: 0.6 %
Eosinophils Absolute: 130 cells/uL (ref 15–500)
Eosinophils Relative: 2.4 %
HCT: 39.1 % (ref 38.5–50.0)
Hemoglobin: 13.1 g/dL — ABNORMAL LOW (ref 13.2–17.1)
Lymphs Abs: 1301 cells/uL (ref 850–3900)
MCH: 30.6 pg (ref 27.0–33.0)
MCHC: 33.5 g/dL (ref 32.0–36.0)
MCV: 91.4 fL (ref 80.0–100.0)
MPV: 9.2 fL (ref 7.5–12.5)
Monocytes Relative: 8.8 %
Neutro Abs: 3461 cells/uL (ref 1500–7800)
Neutrophils Relative %: 64.1 %
Platelets: 268 10*3/uL (ref 140–400)
RBC: 4.28 10*6/uL (ref 4.20–5.80)
RDW: 13 % (ref 11.0–15.0)
Total Lymphocyte: 24.1 %
WBC: 5.4 10*3/uL (ref 3.8–10.8)

## 2021-12-10 LAB — PTH, INTACT AND CALCIUM
Calcium: 9.2 mg/dL (ref 8.6–10.3)
PTH: 53 pg/mL (ref 16–77)

## 2021-12-10 LAB — HEMOGLOBIN A1C
Hgb A1c MFr Bld: 5.4 % of total Hgb (ref <5.7)
Mean Plasma Glucose: 108 mg/dL
eAG (mmol/L): 6 mmol/L

## 2021-12-10 LAB — TSH: TSH: 3.17 mIU/L (ref 0.40–4.50)

## 2021-12-10 LAB — INSULIN, RANDOM: Insulin: 12.5 u[IU]/mL

## 2021-12-10 LAB — LIPID PANEL
Cholesterol: 230 mg/dL — ABNORMAL HIGH (ref <200)
HDL: 89 mg/dL (ref >=40)
LDL Cholesterol (Calc): 124 mg/dL (calc) — ABNORMAL HIGH
Non-HDL Cholesterol (Calc): 141 mg/dL (calc) — ABNORMAL HIGH (ref <130)
Total CHOL/HDL Ratio: 2.6 (calc) (ref <5.0)
Triglycerides: 77 mg/dL (ref <150)

## 2021-12-10 LAB — URINALYSIS, ROUTINE W REFLEX MICROSCOPIC
Bilirubin Urine: NEGATIVE
Glucose, UA: NEGATIVE
Hgb urine dipstick: NEGATIVE
Ketones, ur: NEGATIVE
Leukocytes,Ua: NEGATIVE
Nitrite: NEGATIVE
Protein, ur: NEGATIVE
Specific Gravity, Urine: 1.013 (ref 1.001–1.035)
pH: 5.5 (ref 5.0–8.0)

## 2021-12-10 LAB — MAGNESIUM: Magnesium: 2.1 mg/dL (ref 1.5–2.5)

## 2021-12-10 LAB — VITAMIN D 25 HYDROXY (VIT D DEFICIENCY, FRACTURES): Vit D, 25-Hydroxy: 60 ng/mL (ref 30–100)

## 2021-12-10 LAB — PSA: PSA: 6.04 ng/mL — ABNORMAL HIGH (ref <=4.00)

## 2021-12-10 MED ORDER — EZETIMIBE 10 MG PO TABS: ORAL_TABLET | ORAL | 3 refills | Status: DC

## 2021-12-10 NOTE — Progress Notes (Signed)
<><><><><><><><><><><><><><><><><><><><><><><><><><><><><><><><><> <><><><><><><><><><><><><><><><><><><><><><><><><><><><><><><><><> - Test results slightly outside the reference range are not unusual. If there is anything important, I will review this with you,  otherwise it is considered normal test values.  If you have further questions,  please do not hesitate to contact me at the office or via My Chart.  <><><><><><><><><><><><><><><><><><><><><><><><><><><><><><><><><> <><><><><><><><><><><><><><><><><><><><><><><><><><><><><><><><><>  -  PSA  = 6.04 still slightly elevated - Please continue follow-up with Dr Gerald Stabs Lovena Neighbours  <><><><><><><><><><><><><><><><><><><><><><><><><><><><><><><><><> <><><><><><><><><><><><><><><><><><><><><><><><><><><><><><><><><>  - Kidney functions stable  <><><><><><><><><><><><><><><><><><><><><><><><><><><><><><><><><>  -  Total  Chol =   230     - Elevated             (  Ideal  or  Goal is less than 180  !  )  & -  Bad / Dangerous LDL  Chol =  124  - also Elevated              (  Ideal  or  Goal is less than 70  !  )            So sent in Rx for a new Medicine  =                                                                     Zetia  (Ezetimibe) for elevated cholesterol                                                                                      ( It is not a Statin )   - Also - Diet is Still very Important  !    - Recommend a STRICTER  low cholesterol diet   - Cholesterol only comes from animal sources                                                                                          - ie. meat, dairy, egg yolks  - Eat all the vegetables you want.  - Avoid Meat, Avoid Meat,  Avoid Meat                                                                        - especially Red Meat - Beef AND Pork .  - Avoid cheese & dairy - milk & ice cream.     - Cheese  is the most concentrated form of trans-fats which is                                                                              the worst thing to clog up our arteries.   - Veggie cheese is OK which can be found in the fresh produce section at                                                                     [employer] or [employer] or [employer] <><><><><><><><><><><><><><><><><><><><><><><><><><><><><><><><><> <><><><><><><><><><><><><><><><><><><><><><><><><><><><><><><><><>  - PTH is a hormone that regulates Calcium balance  & is Normal  <><><><><><><><><><><><><><><><><><><><><><><><><><><><><><><><><>  - A1c  is Normal - No Diabetes  - Great ! <><><><><><><><><><><><><><><><><><><><><><><><><><><><><><><><><>  - Vitamin D = 60 - Great  !     -   Please keep dose same  <><><><><><><><><><><><><><><><><><><><><><><><><><><><><><><><><> <><><><><><><><><><><><><><><><><><><><><><><><><><><><><><><><><>  - All Else - CBC - Kidneys - Electrolytes - Liver - Magnesium & Thyroid    - all  Normal / OK <><><><><><><><><><><><><><><><><><><><><><><><><><><><><><><><><> <><><><><><><><><><><><><><><><><><><><><><><><><><><><><><><><><>

## 2021-12-11 ENCOUNTER — Encounter: Payer: Medicare Other | Admitting: Internal Medicine

## 2021-12-16 ENCOUNTER — Encounter: Payer: Self-pay | Admitting: Internal Medicine

## 2021-12-17 ENCOUNTER — Other Ambulatory Visit: Payer: Self-pay | Admitting: Internal Medicine

## 2021-12-17 MED ORDER — EZETIMIBE 10 MG PO TABS: ORAL_TABLET | ORAL | 3 refills | Status: DC

## 2021-12-31 ENCOUNTER — Other Ambulatory Visit (HOSPITAL_BASED_OUTPATIENT_CLINIC_OR_DEPARTMENT_OTHER): Payer: Self-pay

## 2021-12-31 MED ORDER — COMIRNATY 30 MCG/0.3ML IM SUSY
PREFILLED_SYRINGE | INTRAMUSCULAR | 0 refills | Status: DC
  Filled 2021-12-31: qty 0.3, 1d supply, fill #0

## 2022-01-29 ENCOUNTER — Encounter: Payer: Self-pay | Admitting: Internal Medicine

## 2022-01-29 ENCOUNTER — Other Ambulatory Visit (HOSPITAL_BASED_OUTPATIENT_CLINIC_OR_DEPARTMENT_OTHER): Payer: Self-pay

## 2022-01-29 MED ORDER — AREXVY 120 MCG/0.5ML IM SUSR
INTRAMUSCULAR | 0 refills | Status: DC
  Filled 2022-01-29: qty 0.5, 1d supply, fill #0

## 2022-01-30 ENCOUNTER — Encounter (HOSPITAL_COMMUNITY): Payer: Self-pay | Admitting: Emergency Medicine

## 2022-01-30 ENCOUNTER — Emergency Department (HOSPITAL_COMMUNITY): Payer: Medicare Other

## 2022-01-30 ENCOUNTER — Other Ambulatory Visit: Payer: Self-pay

## 2022-01-30 ENCOUNTER — Emergency Department (HOSPITAL_COMMUNITY)
Admission: EM | Admit: 2022-01-30 | Discharge: 2022-01-30 | Disposition: A | Discharge status: Home / Self Care | Payer: Medicare Other | Attending: Emergency Medicine | Admitting: Emergency Medicine

## 2022-01-30 DIAGNOSIS — Y92009 Unspecified place in unspecified non-institutional (private) residence as the place of occurrence of the external cause: Secondary | ICD-10-CM | POA: Diagnosis not present

## 2022-01-30 DIAGNOSIS — W07XXXA Fall from chair, initial encounter: Secondary | ICD-10-CM | POA: Diagnosis not present

## 2022-01-30 DIAGNOSIS — R55 Syncope and collapse: Secondary | ICD-10-CM | POA: Insufficient documentation

## 2022-01-30 DIAGNOSIS — W19XXXA Unspecified fall, initial encounter: Secondary | ICD-10-CM

## 2022-01-30 DIAGNOSIS — D649 Anemia, unspecified: Secondary | ICD-10-CM | POA: Diagnosis not present

## 2022-01-30 DIAGNOSIS — R944 Abnormal results of kidney function studies: Secondary | ICD-10-CM | POA: Insufficient documentation

## 2022-01-30 DIAGNOSIS — Z7901 Long term (current) use of anticoagulants: Secondary | ICD-10-CM | POA: Insufficient documentation

## 2022-01-30 LAB — CBC WITH DIFFERENTIAL/PLATELET
Abs Immature Granulocytes: 0.02 10*3/uL (ref 0.00–0.07)
Basophils Absolute: 0 10*3/uL (ref 0.0–0.1)
Basophils Relative: 1 %
Eosinophils Absolute: 0.1 10*3/uL (ref 0.0–0.5)
Eosinophils Relative: 1 %
HCT: 37.6 % — ABNORMAL LOW (ref 39.0–52.0)
Hemoglobin: 12.5 g/dL — ABNORMAL LOW (ref 13.0–17.0)
Immature Granulocytes: 0 %
Lymphocytes Relative: 14 %
Lymphs Abs: 0.9 10*3/uL (ref 0.7–4.0)
MCH: 30.7 pg (ref 26.0–34.0)
MCHC: 33.2 g/dL (ref 30.0–36.0)
MCV: 92.4 fL (ref 80.0–100.0)
Monocytes Absolute: 0.6 10*3/uL (ref 0.1–1.0)
Monocytes Relative: 10 %
Neutro Abs: 4.6 10*3/uL (ref 1.7–7.7)
Neutrophils Relative %: 74 %
Platelets: 219 10*3/uL (ref 150–400)
RBC: 4.07 MIL/uL — ABNORMAL LOW (ref 4.22–5.81)
RDW: 14.3 % (ref 11.5–15.5)
WBC: 6.2 10*3/uL (ref 4.0–10.5)
nRBC: 0 % (ref 0.0–0.2)

## 2022-01-30 LAB — COMPREHENSIVE METABOLIC PANEL
ALT: 19 U/L (ref 0–44)
AST: 21 U/L (ref 15–41)
Albumin: 4 g/dL (ref 3.5–5.0)
Alkaline Phosphatase: 81 U/L (ref 38–126)
Anion gap: 9 (ref 5–15)
BUN: 28 mg/dL — ABNORMAL HIGH (ref 8–23)
CO2: 24 mmol/L (ref 22–32)
Calcium: 8.9 mg/dL (ref 8.9–10.3)
Chloride: 100 mmol/L (ref 98–111)
Creatinine, Ser: 1.7 mg/dL — ABNORMAL HIGH (ref 0.61–1.24)
GFR, Estimated: 38 mL/min — ABNORMAL LOW (ref >60)
Glucose, Bld: 103 mg/dL — ABNORMAL HIGH (ref 70–99)
Potassium: 4.9 mmol/L (ref 3.5–5.1)
Sodium: 133 mmol/L — ABNORMAL LOW (ref 135–145)
Total Bilirubin: 0.6 mg/dL (ref 0.3–1.2)
Total Protein: 6.7 g/dL (ref 6.5–8.1)

## 2022-01-30 LAB — URINALYSIS, ROUTINE W REFLEX MICROSCOPIC
Bilirubin Urine: NEGATIVE
Glucose, UA: NEGATIVE mg/dL
Hgb urine dipstick: NEGATIVE
Ketones, ur: NEGATIVE mg/dL
Leukocytes,Ua: NEGATIVE
Nitrite: NEGATIVE
Protein, ur: NEGATIVE mg/dL
Specific Gravity, Urine: 1.005 (ref 1.005–1.030)
pH: 6 (ref 5.0–8.0)

## 2022-01-30 LAB — CBG MONITORING, ED: Glucose-Capillary: 104 mg/dL — ABNORMAL HIGH (ref 70–99)

## 2022-01-30 MED ORDER — LACTATED RINGERS IV BOLUS
500.0000 mL | Freq: Once | INTRAVENOUS | Status: AC
  Administered 2022-01-30: 500 mL via INTRAVENOUS

## 2022-01-30 NOTE — ED Provider Notes (Signed)
[facility] EMERGENCY DEPARTMENT Provider Note   CSN: [id number] Arrival date & time: 01/30/22  1506     History  Chief Complaint  Patient presents with   Fall   Loss of Consciousness    Eugene Friedman is a 88 y.o. male.  HPI The patient is a 88 yo male with history of A-fib, on Xarelto (last dose last night) presenting by EMS as a level 2 trauma activation after a syncopal episode.  Per EMS, the patient, and the patient's daughter who is at bedside.  The patient began feeling lightheaded like he was going to pass out.  He reportedly slumped over in his chair, passing out and then fell from his chair to the floor at which point he hit his head.  The event was witnessed by the patient's daughter.  There was reported 22nd loss of consciousness with return to baseline.  EMS was called and the patient was noted to be bradycardic with a heart rate in the 50s but otherwise hemodynamically stable throughout transport with an appropriate blood sugar level.  On arrival, the patient has no current medical concerns and denies headache, or neck pain.    Home Medications Prior to Admission medications   Medication Sig Start Date End Date Taking? Authorizing Provider  XARELTO 15 MG TABS tablet TAKE 1 TABLET BY MOUTH DAILY FOR BLOOD CLOT PREVENTION OR ATRIAL FIBRILLATION 06/23/21  Yes Cranford, Kenney Houseman, NP  amoxicillin (AMOXIL) 250 MG capsule Take  1 capsule  3 x /day  with Meals for Gum or Dental  Infection 06/25/21   [name], MD  Cholecalciferol (VITAMIN D-3) 5000 UNITS TABS Take by mouth.    [provider]  COVID-19 mRNA vaccine [id number] (COMIRNATY) syringe Inject into the muscle. 12/31/21   Carlyle Basques, MD  cyclobenzaprine (FLEXERIL) 10 MG tablet Take 1/2 to 1 tablet 3 x /day as needed for Muscle Spasm 11/20/21   [name], MD  dicyclomine (BENTYL) 20 MG tablet TAKE 1 TABLET BY MOUTH 3 TO 4 TIMES PER DAY BEFORE MEALS AND AT BEDTIME AS NEEDED FOR NAUSEA  OR CRAMPING OR BLOATING OR DIARRHEA 11/14/21   Darrol Jump, NP  diltiazem (CARDIZEM CD) 120 MG 24 hr capsule Take  1 capsule  Daily  with Food for BP & Heart Rhythm 12/09/21   [name], MD  ezetimibe (ZETIA) 10 MG tablet Take  1 tablet  Daily  for Cholesterol 12/17/21   [name], MD  finasteride (PROSCAR) 5 MG tablet Take 1 tablet Daily for Prostate 11/05/21   Darrol Jump, NP  flecainide (TAMBOCOR) 50 MG tablet Take 1 & 1/2  tablet (75 mg)   2 x /day  (every 12 hours )        for Afib 03/06/21   [name], MD  hyoscyamine (LEVSIN SL) 0.125 MG SL tablet DISSOLVE 1 TO 2 TABLETS UNDER THE TONGUE FOUR TIMES DAILY AS NEEDED FOR ABDOMINAL CRAMPS OR NAUSEA OR DIARRHEA 02/04/18   Liane Comber, NP  LUTEIN PO Take 5 mg by mouth 2 (two) times daily.    [provider]  RSV vaccine recomb adjuvanted (AREXVY) 120 MCG/0.5ML injection Inject into the muscle. 01/29/22   Carlyle Basques, MD  tamsulosin [facility]) 0.4 MG CAPS capsule Take 1 tablet at Bedtime for Prostate 05/27/20   [name], MD  Vibegron [facility]) 75 MG TABS Take  1 tablet  at Bedtime  for Bladder 03/30/21   [name], MD      Allergies  Celebrex [celecoxib], Lipitor [atorvastatin], Ppd [tuberculin purified protein derivative], Adhesive [tape], and Nickel    Review of Systems   Review of Systems  See HPI  Physical Exam Updated Vital Signs BP 137/86   Pulse 62   Temp 98.5 F (36.9 C)   Resp 14   Ht '6\' 1"'$  (1.854 m)   Wt 90.7 kg   SpO2 98%   BMI 26.39 kg/m  Physical Exam Vitals and nursing note reviewed.  Constitutional:      General: He is not in acute distress.    Appearance: He is well-developed.  HENT:     Head: Normocephalic and atraumatic.     Mouth/Throat:     Mouth: Mucous membranes are moist.     Pharynx: Oropharynx is clear.  Eyes:     Conjunctiva/sclera: Conjunctivae normal.  Cardiovascular:     Rate and Rhythm: Normal rate and regular rhythm.     Heart sounds:  No murmur heard. Pulmonary:     Effort: Pulmonary effort is normal. No respiratory distress.     Breath sounds: Normal breath sounds.  Abdominal:     Palpations: Abdomen is soft.     Tenderness: There is no abdominal tenderness.  Musculoskeletal:        General: No swelling.     Cervical back: Neck supple.  Skin:    General: Skin is warm and dry.     Capillary Refill: Capillary refill takes less than 2 seconds.  Neurological:     General: No focal deficit present.     Mental Status: He is alert and oriented to person, place, and time. Mental status is at baseline.     Cranial Nerves: No cranial nerve deficit.     Sensory: No sensory deficit.     Motor: No weakness.     Coordination: Coordination normal.  Psychiatric:        Mood and Affect: Mood normal.     ED Results / Procedures / Treatments   Labs (all labs ordered are listed, but only abnormal results are displayed) Labs Reviewed  CBC WITH DIFFERENTIAL/PLATELET - Abnormal; Notable for the following components:      Result Value   RBC 4.07 (*)    Hemoglobin 12.5 (*)    HCT 37.6 (*)    All other components within normal limits  COMPREHENSIVE METABOLIC PANEL - Abnormal; Notable for the following components:   Sodium 133 (*)    Glucose, Bld 103 (*)    BUN 28 (*)    Creatinine, Ser 1.70 (*)    GFR, Estimated 38 (*)    All other components within normal limits  CBG MONITORING, ED - Abnormal; Notable for the following components:   Glucose-Capillary 104 (*)    All other components within normal limits  URINALYSIS, ROUTINE W REFLEX MICROSCOPIC    EKG EKG Interpretation  Date/Time:  Friday January 30 2022 15:35:19 EST Ventricular Rate:  59 PR Interval:  275 QRS Duration: 103 QT Interval:  428 QTC Calculation: 424 R Axis:   32 Text Interpretation: Sinus rhythm Prolonged PR interval No significant change since last tracing Confirmed by Isla Pence [phone]) on 01/30/2022 3:55:41 PM  Radiology CT Head Wo  Contrast  Result Date: 01/30/2022 CLINICAL DATA:  Head trauma, minor (Age >= 65y); Polytrauma, blunt EXAM: CT HEAD WITHOUT CONTRAST CT CERVICAL SPINE WITHOUT CONTRAST TECHNIQUE: Multidetector CT imaging of the head and cervical spine was performed following the standard protocol without intravenous contrast. Multiplanar CT image reconstructions of the cervical  spine were also generated. RADIATION DOSE REDUCTION: This exam was performed according to the departmental dose-optimization program which includes automated exposure control, adjustment of the mA and/or kV according to patient size and/or use of iterative reconstruction technique. COMPARISON:  None Available. FINDINGS: CT HEAD FINDINGS BRAIN: BRAIN Cerebral ventricle sizes are concordant with the degree of cerebral volume loss. Patchy and confluent areas of decreased attenuation are noted throughout the deep and periventricular white matter of the cerebral hemispheres bilaterally, compatible with chronic microvascular ischemic disease. No evidence of large-territorial acute infarction. No parenchymal hemorrhage. No mass lesion. No extra-axial collection. No mass effect or midline shift. No hydrocephalus. Basilar cisterns are patent. Vascular: No hyperdense vessel. Skull: No acute fracture or focal lesion. Sinuses/Orbits: Paranasal sinuses and mastoid air cells are clear. Bilateral lens replacement. Otherwise the orbits are unremarkable. Other: None. CT CERVICAL SPINE FINDINGS Alignment: Normal. Skull base and vertebrae: Multilevel moderate to severe degenerative changes spine with associated multilevel moderate - severe osseous neural foraminal stenosis. No severe osseous central canal stenosis. No acute fracture. No aggressive appearing focal osseous lesion or focal pathologic process. Soft tissues and spinal canal: No prevertebral fluid or swelling. No visible canal hematoma. Upper chest: Unremarkable. Other: None. IMPRESSION: 1. No acute intracranial  abnormality. 2. No acute displaced fracture or traumatic listhesis of the cervical spine. 3. Multilevel moderate severe degenerative changes spine with associated multilevel moderate severe osseous neural foraminal stenosis. Electronically Signed   By: Iven Finn M.D.   On: 01/30/2022 18:59   CT Cervical Spine Wo Contrast  Result Date: 01/30/2022 CLINICAL DATA:  Head trauma, minor (Age >= 65y); Polytrauma, blunt EXAM: CT HEAD WITHOUT CONTRAST CT CERVICAL SPINE WITHOUT CONTRAST TECHNIQUE: Multidetector CT imaging of the head and cervical spine was performed following the standard protocol without intravenous contrast. Multiplanar CT image reconstructions of the cervical spine were also generated. RADIATION DOSE REDUCTION: This exam was performed according to the departmental dose-optimization program which includes automated exposure control, adjustment of the mA and/or kV according to patient size and/or use of iterative reconstruction technique. COMPARISON:  None Available. FINDINGS: CT HEAD FINDINGS BRAIN: BRAIN Cerebral ventricle sizes are concordant with the degree of cerebral volume loss. Patchy and confluent areas of decreased attenuation are noted throughout the deep and periventricular white matter of the cerebral hemispheres bilaterally, compatible with chronic microvascular ischemic disease. No evidence of large-territorial acute infarction. No parenchymal hemorrhage. No mass lesion. No extra-axial collection. No mass effect or midline shift. No hydrocephalus. Basilar cisterns are patent. Vascular: No hyperdense vessel. Skull: No acute fracture or focal lesion. Sinuses/Orbits: Paranasal sinuses and mastoid air cells are clear. Bilateral lens replacement. Otherwise the orbits are unremarkable. Other: None. CT CERVICAL SPINE FINDINGS Alignment: Normal. Skull base and vertebrae: Multilevel moderate to severe degenerative changes spine with associated multilevel moderate - severe osseous neural  foraminal stenosis. No severe osseous central canal stenosis. No acute fracture. No aggressive appearing focal osseous lesion or focal pathologic process. Soft tissues and spinal canal: No prevertebral fluid or swelling. No visible canal hematoma. Upper chest: Unremarkable. Other: None. IMPRESSION: 1. No acute intracranial abnormality. 2. No acute displaced fracture or traumatic listhesis of the cervical spine. 3. Multilevel moderate severe degenerative changes spine with associated multilevel moderate severe osseous neural foraminal stenosis. Electronically Signed   By: Iven Finn M.D.   On: 01/30/2022 18:59   DG Chest Portable 1 View  Result Date: 01/30/2022 CLINICAL DATA:  Syncope, fall striking head today. EXAM: PORTABLE CHEST 1 VIEW COMPARISON:  Thoracic MRI from 01/22/2018 FINDINGS: Coils in the paracentral position along the middle of the chest likely related to the patient's prior dural AVM occlusion procedure. Atherosclerotic calcification of the aortic arch. Tortuous thoracic aorta. Linear subsegmental atelectasis or scarring along the left hemidiaphragm. The lungs appear otherwise clear. No blunting of the costophrenic angles. Degenerative glenohumeral arthropathy, left greater than right. IMPRESSION: 1. Subsegmental atelectasis or scarring along the left hemidiaphragm. 2. Tortuous thoracic aorta.  Aortic Atherosclerosis (ICD10-I70.0). 3. Degenerative glenohumeral arthropathy, left greater than right. Electronically Signed   By: Van Clines M.D.   On: 01/30/2022 15:53    Procedures Procedures    Medications Ordered in ED Medications  lactated ringers bolus 500 mL (0 mLs Intravenous Stopped 01/30/22 2027)    ED Course/ Medical Decision Making/ A&P                           Medical Decision Making  The patient is a 88 yo male with history of A-fib, on Xarelto (last dose last night) presenting by EMS as a level 2 trauma activation after a syncopal episode.  The differential  diagnosis includes: Arrhythmia, CVA, vasovagal, medication side effect, electrolyte abnormality, dehydration, anemia, infection.  On initial evaluation, the patient was bradycardic in the 50s but normotensive.  He was alert and oriented x 4 with a GCS of 15.  There were no significant findings on the patient's physical exam and he was noted to have no spinal tenderness to palpation.  The patient's diagnostic workup which I independently reviewed and interpreted, included an EKG which showed sinus rhythm with a prolonged PR interval of 275 but no signs of ischemia or changes from prior.  Chest x-ray was ordered which showed subsegmental atelectasis and a tortuous thoracic aorta but no acute cardiopulmonary abnormality or focal consolidation to suggest pneumonia.  His laboratory workup included CBC with white blood cell count of 6.2 and hemoglobin of 12.5; point-of-care glucose of 104; CMP with sodium 133, BUN elevated 28, creatinine of 1.70 up from a baseline of around 1.5; and urinalysis which not show any signs of infection.  The patient also received a CT head which showed no acute intracranial abnormality; and CT C-spine without fracture or dislocation.  While in the emergency department the patient was given a bolus of IV fluids.  The patient was ambulatory at baseline.  He had no additional episodes of syncope or prodromal feelings while in the emergency department.  Based on the patient's history, physical exam, and diagnostic workup dehydration is favored as likely source of the patient's symptoms.  He reports a longstanding history of poor fluid intake and had an elevated BUN.  Alternative causes such as CVA, arrhythmia, electrolyte abnormality were also considered however, there were no abnormal findings on patient's CT head, he had a normal neurological exam, there were no significant electrolyte abnormalities, anemia, or infectious symptoms to suggest alternative causes.  The patient was  discharged with instructions to follow-up with his PCP in 24-48 hours for reevaluation and given strict return precautions to the emergency department.  Amount and/or Complexity of Data Reviewed Independent Historian: caregiver and EMS    Details: Daughter External Data Reviewed: labs and notes. Labs: ordered. Decision-making details documented in ED Course. Radiology: ordered and independent interpretation performed. Decision-making details documented in ED Course. ECG/medicine tests: ordered and independent interpretation performed. Decision-making details documented in ED Course.   Patient's presentation is most consistent with acute presentation with potential threat  to life or bodily function.         Final Clinical Impression(s) / ED Diagnoses Final diagnoses:  Fall, initial encounter    Rx / DC Orders ED Discharge Orders     None         Dani Gobble, MD 01/30/22 2220    Isla Pence, MD 01/31/22 1515

## 2022-01-30 NOTE — ED Notes (Signed)
Unable to get the vitals to show up

## 2022-01-30 NOTE — Discharge Instructions (Signed)
You were evaluated in the emergency department after syncopal event with fall.  There were no significant findings on your diagnostic imaging.  Please follow-up with your primary care doctor in 24-48 hours for reevaluation and return to the emergency department if there is another episode of syncope.  While in the emergency department we discussed dehydration as a possible cause and you are encouraged to drink more water.

## 2022-01-30 NOTE — ED Triage Notes (Signed)
Pt BIB [facility] from home, family report pt standing for extended period of time had a near syncope episode and started to block out. EMS reported family said pt fell and it head, but not sure what he hit his head on. It was reported he has history of orthostatic hypotension. Pt is on Xarelto and the last time taken was last night.

## 2022-01-30 NOTE — ED Notes (Signed)
Patient transported to CT 

## 2022-02-02 NOTE — Progress Notes (Unsigned)
Future Appointments  Date Time Provider Department  02/03/2022 10:30 AM [name], MD [facility]  06/12/2022 11:00 AM Darrol Jump, NP [facility]  12/15/2022 11:00 AM [name], MD [facility]    History of Present Illness:        This is a very nice 88 y.o. WWM with HTN, HLD, Pre-Diabetes, ASHD /cAfib,  IBS  and Vitamin D Deficiency. Patient's HTN predates from 2001 Ns he has hx/o a Negative Cardiolite in 2011 and Negative ETT in 2017.  In Sept 2020,  he was dx's with Afib & started on Xarelto.   He failed CV x 2 at [facility] in Oct & Dec 2020.  More recently he apparently has been having postural presyncopal episodes and had a ""Tele-video " visit yesterday with his [facility]ologist & was advised to contact his PCP.                                                                   Medications     diltiazem CD 120 MG 24 hr capsule, Take  1 capsule  Daily  with Food for BP & Heart Rhythm   ezetimibe  10 MG tablet, Take  1 tablet  Daily      flecainide 50 MG tablet, Take 1 & 1/2  tablet (75 mg)   2 x /day  (every 12 hours )        for Afib    XARELTO 15 MG TABS tablet, TAKE 1 TABLET  DAILY   amoxicillin  250 MG capsule, Take  1 capsule  3 x /day  with Meals for Gum or Dental  Infection   VITAMIN  5000 UNITS TABS   cyclobenzaprine 10 MG tablet, Take 1/2 to 1 tablet 3 x /day as needed for Muscle Spasm   dicyclomine  20 MG tablet, TAKE 1 TABLET  3 TO 4 TIMES PER DAY BEFORE MEALS AND AT BEDTIME AS NEEDED    finasteride (PROSCAR) 5 MG tablet, Take 1 tablet Daily   hyoscyamine (LEVSIN SL) 0.125 MG SL tablet, DISSOLVE 1 TO 2 TABLETS UNDER THE TONGUE FOUR TIMES DAILY    LUTEIN  5 mg , Take 2  times daily.   tamsulosin (FLOMAX) 0.4 MG CAPS capsule, Take 1 tablet at Bedtime    Vibegron (GEMTESA) 75 MG TABS, Take  1 tablet  at Bedtime   Problem list He has Hyperlipidemia; Sinus bradycardia; Vitamin D deficiency; History of elevated glucose; Elevated prostate  specific antigen (PSA); Labile hypertension; Medication management; Prostate cancer ([place]); Spinal stenosis; Persistent atrial fibrillation: CHA2DS2Vasc = 3 (Age & HTN); AVM (arteriovenous malformation); and Stage 3b chronic kidney disease ([place]) on their problem list.   Observations/Objective:  There were no vitals taken for this visit.  HEENT - WNL. Neck - supple.  Chest - Clear equal BS. Cor - Nl HS. RRR w/o sig MGR. PP 1(+). No edema. MS- FROM w/o deformities.  Gait Nl. Neuro -  Nl w/o focal abnormalities.   Assessment and Plan:      Follow Up Instructions:        I discussed the assessment and treatment plan with the patient. The patient was provided an opportunity to ask questions and all were answered. The patient agreed with the plan  and demonstrated an understanding of the instructions.       The patient was advised to call back or seek an in-person evaluation if the symptoms worsen or if the condition fails to improve as anticipated.    Kirtland Bouchard, MD

## 2022-02-02 NOTE — Patient Instructions (Signed)
Near-Syncope                                                                                      Near-syncope is when you suddenly feel like you might pass out or faint, but you do not actually lose consciousness. This may also be referred to as presyncope. During an episode of near-syncope, you may: Feel dizzy, weak, light-headed, or like the room is spinning. Feel nauseous. See spots or see all white or all black in your field of vision. Have cold, clammy skin or feel warm and sweaty. Hear ringing in your ears (tinnitus). This condition is caused by a sudden decrease in blood flow to the brain. This decrease can result from various causes, but most of those causes are not dangerous. However, near-syncope may be a sign of a serious medical problem, so it is important to seek medical care. Follow these instructions at home: Medicines Take over-the-counter and prescription medicines only as told by your health care provider. If you are taking blood pressure or heart medicine, get up slowly and take several minutes to sit and then stand. This can reduce dizziness and decrease the risk of near-syncope. Lifestyle Do not drive, use machinery, or play sports until your health care provider says it is okay. Do not drink alcohol. Do not use any products that contain nicotine or tobacco. These products include cigarettes, chewing tobacco, and vaping devices, such as e-cigarettes. If you need help quitting, ask your health care provider. Avoid hot tubs and saunas. General instructions Pay attention to any changes in your symptoms. Talk with your health care provider about your symptoms. You may need to have testing to understand the cause of your near-syncope. If you start to feel like you might faint, sit or lie down right away. If sitting, put your head down between your legs. If lying down, raise (elevate) your feet above the level of your heart. Breathe deeply and steadily. Wait until all of the  symptoms have passed. Have someone stay with you until you feel stable. Drink enough fluid to keep your urine pale yellow. Avoid prolonged standing. If you must stand for a long time, do movements such as: Moving your legs. Crossing your legs. Flexing and stretching your leg muscles. Squatting. Keep all follow-up visits. This is important. Contact a health care provider if: You continue to have episodes of near fainting. Get help right away if: You faint. You have any of these symptoms that may indicate trouble with your heart: Fast or irregular heartbeats (palpitations). Unusual pain in your chest, abdomen, or back. Shortness of breath. You have a seizure. You have a severe headache. You are confused. You have vision problems. You have severe weakness or trouble walking. You are bleeding from your mouth or rectum, or have black or tarry stool. These symptoms may represent a serious problem that is an emergency. Do not wait to see if your symptoms will go away. Get medical help right away. Call your local emergency services (911 in the [place] Do not drive yourself to the hospital. Summary Near-syncope is when you suddenly feel like you might pass out or faint, but you  do not actually lose consciousness. This condition is caused by a sudden decrease in blood flow to the brain. This decrease can result from various causes, but most of those causes are not dangerous. Near-syncope may be a sign of a serious medical problem, so it is important to seek medical care. If you start to feel like you might faint, sit or lie down right away. If sitting, put your head down between your legs. If lying down, raise (elevate) your feet above the level of your heart. Talk with your health care provider about your symptoms. You may need to have testing to understand the cause of your near-syncope. This information is not intended to replace advice given to you by your health care provider. Make sure you  discuss any questions you have with your health care provider. Document Revised: 06/13/2020 Document Reviewed: 06/13/2020 Elsevier Patient Education  [place].

## 2022-02-03 ENCOUNTER — Ambulatory Visit (INDEPENDENT_AMBULATORY_CARE_PROVIDER_SITE_OTHER): Payer: Medicare Other | Admitting: Internal Medicine

## 2022-02-03 ENCOUNTER — Encounter: Payer: Self-pay | Admitting: Internal Medicine

## 2022-02-03 VITALS — BP 150/70 | HR 115 | Temp 97.9°F | Resp 17 | Ht 73.0 in | Wt 206.0 lb

## 2022-02-03 DIAGNOSIS — I10 Essential (primary) hypertension: Secondary | ICD-10-CM | POA: Diagnosis not present

## 2022-02-03 DIAGNOSIS — R42 Dizziness and giddiness: Secondary | ICD-10-CM | POA: Diagnosis not present

## 2022-02-03 DIAGNOSIS — Z79899 Other long term (current) drug therapy: Secondary | ICD-10-CM

## 2022-02-03 DIAGNOSIS — R55 Syncope and collapse: Secondary | ICD-10-CM

## 2022-02-03 DIAGNOSIS — I482 Chronic atrial fibrillation, unspecified: Secondary | ICD-10-CM

## 2022-02-04 ENCOUNTER — Other Ambulatory Visit: Payer: Self-pay | Admitting: Nurse Practitioner

## 2022-02-04 LAB — CBC WITH DIFFERENTIAL/PLATELET
Absolute Monocytes: 599 cells/uL (ref 200–950)
Basophils Absolute: 40 cells/uL (ref 0–200)
Basophils Relative: 0.7 %
Eosinophils Absolute: 131 cells/uL (ref 15–500)
Eosinophils Relative: 2.3 %
HCT: 38.8 % (ref 38.5–50.0)
Hemoglobin: 13.2 g/dL (ref 13.2–17.1)
Lymphs Abs: 1311 cells/uL (ref 850–3900)
MCH: 31.1 pg (ref 27.0–33.0)
MCHC: 34 g/dL (ref 32.0–36.0)
MCV: 91.3 fL (ref 80.0–100.0)
MPV: 9.4 fL (ref 7.5–12.5)
Monocytes Relative: 10.5 %
Neutro Abs: 3620 cells/uL (ref 1500–7800)
Neutrophils Relative %: 63.5 %
Platelets: 251 10*3/uL (ref 140–400)
RBC: 4.25 10*6/uL (ref 4.20–5.80)
RDW: 13.3 % (ref 11.0–15.0)
Total Lymphocyte: 23 %
WBC: 5.7 10*3/uL (ref 3.8–10.8)

## 2022-02-04 LAB — COMPLETE METABOLIC PANEL WITH GFR
AG Ratio: 1.5 (calc) (ref 1.0–2.5)
ALT: 19 U/L (ref 9–46)
AST: 18 U/L (ref 10–35)
Albumin: 4 g/dL (ref 3.6–5.1)
Alkaline phosphatase (APISO): 89 U/L (ref 35–144)
BUN/Creatinine Ratio: 19 (calc) (ref 6–22)
BUN: 29 mg/dL — ABNORMAL HIGH (ref 7–25)
CO2: 26 mmol/L (ref 20–32)
Calcium: 9.1 mg/dL (ref 8.6–10.3)
Chloride: 104 mmol/L (ref 98–110)
Creat: 1.56 mg/dL — ABNORMAL HIGH (ref 0.70–1.22)
Globulin: 2.6 g/dL (calc) (ref 1.9–3.7)
Glucose, Bld: 96 mg/dL (ref 65–99)
Potassium: 4.8 mmol/L (ref 3.5–5.3)
Sodium: 136 mmol/L (ref 135–146)
Total Bilirubin: 0.5 mg/dL (ref 0.2–1.2)
Total Protein: 6.6 g/dL (ref 6.1–8.1)
eGFR: 42 mL/min/{1.73_m2} — ABNORMAL LOW (ref >=60)

## 2022-02-04 LAB — MAGNESIUM: Magnesium: 2.2 mg/dL (ref 1.5–2.5)

## 2022-02-04 LAB — TSH: TSH: 3.8 mIU/L (ref 0.40–4.50)

## 2022-02-06 ENCOUNTER — Other Ambulatory Visit: Payer: Self-pay | Admitting: Nurse Practitioner

## 2022-03-11 ENCOUNTER — Ambulatory Visit (INDEPENDENT_AMBULATORY_CARE_PROVIDER_SITE_OTHER): Payer: Medicare Other | Admitting: Internal Medicine

## 2022-03-11 ENCOUNTER — Encounter: Payer: Self-pay | Admitting: Internal Medicine

## 2022-03-11 VITALS — BP 138/70 | HR 72 | Temp 97.9°F | Resp 17 | Ht 73.0 in | Wt 198.2 lb

## 2022-03-11 DIAGNOSIS — R42 Dizziness and giddiness: Secondary | ICD-10-CM

## 2022-03-11 DIAGNOSIS — I482 Chronic atrial fibrillation, unspecified: Secondary | ICD-10-CM

## 2022-03-11 DIAGNOSIS — I10 Essential (primary) hypertension: Secondary | ICD-10-CM

## 2022-03-11 DIAGNOSIS — R55 Syncope and collapse: Secondary | ICD-10-CM | POA: Diagnosis not present

## 2022-03-11 NOTE — Progress Notes (Signed)
Future Appointments  Date Time Provider Department  03/11/2022 11:30 AM [name], MD [facility]  06/12/2022                wellness 11:00 AM Darrol Jump, NP [facility]  12/15/2022               cpe 11:00 AM [name], MD [facility]    History of Present Illness:       Patient is a very nice 89 yo WWM with hx/o HTN , ASHD dx'd with Afib (sept 2020) started on Xarelto, failed CV x 2 Oct/Dec 2020 at [facility]. Recently in Dec 2023, he had a syncopal episode , evaluated at thre ER with EKG finding SB with 1st 0 AVB & repeat EKG Dec 19 back in Afib.  Patient had been having N x 5-10 x and was increased to double dose of Tamsulosin qhs which was speculated causative of his syncopal episode. He & daughter were advised to decrease Tamsulosin back to 1 tablet qhs and seek 2sd opinion with a Dealer at [employer]. Also advised to monitor BID postural sitting /Standing BPs.  All labs  - CBC,  Kidney,  Electrolytes,  Liver,  Magnesium & Thyroid  returned WNL.       Over the last month, patient denies any episodes of postural or orthostatic lightheadedness or "dizziness" . Also denies any CP, palpitations, dyspnea/orthopnea/PND or dependent edema. Furyher ,since tapering his tamsulosin from 2 down to 1 tab, his nocturua seems improved     Current Outpatient Medications on File Prior to Visit  Medication Sig   amoxicillin (AMOXIL) 250 MG capsule Take  1 capsule  3 x /day  with Meals for Gum or Dental  Infection   Cholecalciferol (VITAMIN D-3) 5000 UNITS TABS Take by mouth.   COVID-19 mRNA vaccine 2023-2024 (COMIRNATY) syringe Inject into the muscle.   cyclobenzaprine (FLEXERIL) 10 MG tablet Take 1/2 to 1 tablet 3 x /day as needed for Muscle Spasm   dicyclomine (BENTYL) 20 MG tablet TAKE 1 TABLET BY MOUTH 3 TO 4 TIMES PER DAY BEFORE MEALS AND AT BEDTIME AS NEEDED FOR NAUSEA OR CRAMPING OR BLOATING OR DIARRHEA   diltiazem (CARDIZEM CD) 120 MG 24 hr capsule Take  1 capsule  Daily  with Food for  BP & Heart Rhythm   ezetimibe (ZETIA) 10 MG tablet Take  1 tablet  Daily  for Cholesterol   finasteride (PROSCAR) 5 MG tablet TAKE 1 TABLET BY MOUTH DAILY FOR PROSTATE   flecainide (TAMBOCOR) 50 MG tablet Take 1 & 1/2  tablet (75 mg)   2 x /day  (every 12 hours )        for Afib   hyoscyamine (LEVSIN SL) 0.125 MG SL tablet DISSOLVE 1 TO 2 TABLETS UNDER THE TONGUE FOUR TIMES DAILY AS NEEDED FOR ABDOMINAL CRAMPS OR NAUSEA OR DIARRHEA   LUTEIN PO Take 5 mg by mouth 2 (two) times daily.   RSV vaccine recomb adjuvanted (AREXVY) 120 MCG/0.5ML injection Inject into the muscle.   tamsulosin (FLOMAX) 0.4 MG CAPS capsule Take 1 tablet at Bedtime for Prostate   Vibegron (GEMTESA) 75 MG TABS Take  1 tablet  at Bedtime  for Bladder   XARELTO 15 MG TABS tablet TAKE 1 TABLET BY MOUTH DAILY FOR BLOOD CLOT PREVENTION OR ATRIAL FIBRILLATION   No current facility-administered medications on file prior to visit.    Allergies  Allergen Reactions   Celebrex [Celecoxib] Other (See Comments)  GI upset   Lipitor [Atorvastatin]    Ppd [Tuberculin Purified Protein Derivative] Other (See Comments)    Positive PPD   Adhesive [Tape] Rash    Zinc in the band-aid will cause a rash   Nickel Rash     Problem list He has Hyperlipidemia; Sinus bradycardia; Vitamin D deficiency; History of elevated glucose; Elevated prostate specific antigen (PSA); Labile hypertension; Medication management; Prostate cancer ([place]); Spinal stenosis; Persistent atrial fibrillation: CHA2DS2Vasc = 3 (Age & HTN); AVM (arteriovenous malformation); and Stage 3b chronic kidney disease (HCC) on their problem list.   Observations/Objective:  BP 138/70   Pulse 72   Temp 97.9 F (36.6 C)   Resp 17   Ht '6\' 1"'$  (1.854 m)   Wt 198 lb 3.2 oz (89.9 kg)   SpO2 97%   BMI 26.15 kg/m   HEENT - WNL. Neck - supple.  Chest - Clear equal BS. Cor - Nl HS. RRR w/o sig MGR. PP 1(+). No edema. MS- FROM w/o deformities.  Gait Nl. Neuro -  Nl w/o focal  abnormalities.   Assessment and Plan:   1. Essential hypertension   2. Postural dizziness with presyncope   3. Chronic atrial fibrillation (HCC)    Follow Up Instructions:        I discussed the assessment and treatment plan with the patient. The patient was provided an opportunity to ask questions and all were answered. The patient agreed with the plan and demonstrated an understanding of the instructions.       The patient was advised to call back or seek an in-person evaluation if the symptoms worsen or if the condition fails to improve as anticipated.    Kirtland Bouchard, MD

## 2022-05-04 ENCOUNTER — Other Ambulatory Visit: Payer: Self-pay | Admitting: Nurse Practitioner

## 2022-06-03 ENCOUNTER — Other Ambulatory Visit (HOSPITAL_BASED_OUTPATIENT_CLINIC_OR_DEPARTMENT_OTHER): Payer: Self-pay

## 2022-06-03 MED ORDER — COMIRNATY 30 MCG/0.3ML IM SUSY
0.3000 mL | PREFILLED_SYRINGE | INTRAMUSCULAR | 0 refills | Status: DC
  Filled 2022-06-03: qty 0.3, 1d supply, fill #0

## 2022-06-08 ENCOUNTER — Encounter: Payer: Self-pay | Admitting: Nurse Practitioner

## 2022-06-08 ENCOUNTER — Ambulatory Visit (INDEPENDENT_AMBULATORY_CARE_PROVIDER_SITE_OTHER): Payer: Medicare Other | Admitting: Nurse Practitioner

## 2022-06-08 VITALS — BP 158/80 | HR 68 | Temp 97.7°F | Ht 73.0 in | Wt 202.0 lb

## 2022-06-08 DIAGNOSIS — M25521 Pain in right elbow: Secondary | ICD-10-CM | POA: Diagnosis not present

## 2022-06-08 DIAGNOSIS — W19XXXA Unspecified fall, initial encounter: Secondary | ICD-10-CM

## 2022-06-08 DIAGNOSIS — S51011A Laceration without foreign body of right elbow, initial encounter: Secondary | ICD-10-CM

## 2022-06-08 DIAGNOSIS — S80812A Abrasion, left lower leg, initial encounter: Secondary | ICD-10-CM

## 2022-06-08 DIAGNOSIS — S8011XA Contusion of right lower leg, initial encounter: Secondary | ICD-10-CM

## 2022-06-08 NOTE — Progress Notes (Signed)
Assessment and Plan:  Eugene Friedman was seen today for an episodic visit.  Fall, initial encounter Change positions slowly Continue use of straight cane  Right elbow pain FROM Defers x-ray Ice/Rest Continue to monitor  Skin tear of right elbow without complication, initial encounter Dressing change completed Cleansed with NS and peroxide Applied Triple Antibiotic ointment Covered with wet to dry dressing Continue to monitor for increase s/s of infection including redness, swelling, streaking, warmth, N/V.  Left leg bruise with abrasion Area cleansed with NS  Applied Triple Antibiotic ointment Covered with bandage Continue to monitor for increase s/s of infection including redness, swelling, streaking, warmth, N/V.  Keep f/u apt for 06/12/22.  Notify office for further evaluation and treatment, questions or concerns if s/s fail to improve. The risks and benefits of my recommendations, as well as other treatment options were discussed with the patient today. Questions were answered.  Further disposition pending results of labs. Discussed med's effects and SE's.    Over 25 minutes of exam, counseling, chart review, and critical decision making was performed.   Future Appointments  Date Time Provider Department Center  06/12/2022 11:00 AM Adela Glimpse, NP [facility] None  12/15/2022 11:00 AM Lucky Cowboy, MD [facility] None    ------------------------------------------------------------------------------------------------------------------   HPI BP (!) 158/80   Pulse 68   Temp 97.7 F (36.5 C)   Ht  (1.854 m)   Wt 202 lb (91.6 kg)   SpO2 99%   BMI 26.65 kg/m   89 y.o.male presents for evaluation of fall that occurred 4 days ago.  States he scrapped his right elbow across his door frame when turning quickly.  He did hit the right elbow when falling.  He complains of  minimal pain and FROM.  He also notes a fall in the shower x1 week ago while balancing on  one foot when washing.  States his right thigh hit the side of the shower chair.  He noted an abrasion and bruising, however abrasion is mostly well healed.  He presents with both abrasions covered in office today.  Denies any redness, streaking, warmth, N/V, fever, abdominal pain.   Past Medical History:  Diagnosis Date   Allergic rhinitis    Atrial fibrillation by electrocardiogram (HCC) 10/2018   Was started on calcium channel blocker, beta-blocker and Xarelto   Cancer (HCC)    prostate   DJD (degenerative joint disease)    Hypertension    Borderline    Sinus bradycardia    Spinal stenosis    Complicated by dural AVM   Vitamin D deficiency      Allergies  Allergen Reactions   Celebrex [Celecoxib] Other (See Comments)    GI upset   Lipitor [Atorvastatin]    Ppd [Tuberculin Purified Protein Derivative] Other (See Comments)    Positive PPD   Adhesive [Tape] Rash    Zinc in the band-aid will cause a rash   Nickel Rash    Current Outpatient Medications on File Prior to Visit  Medication Sig   amoxicillin (AMOXIL) 250 MG capsule Take  1 capsule  3 x /day  with Meals for Gum or Dental  Infection   Cholecalciferol (VITAMIN D-3) 5000 UNITS TABS Take by mouth.   COVID-19 mRNA vaccine 2023-2024 (COMIRNATY) syringe Inject into the muscle.   COVID-19 mRNA vaccine 2023-2024 (COMIRNATY) syringe Inject 0.3 mLs into the muscle.   cyclobenzaprine (FLEXERIL) 10 MG tablet Take 1/2 to 1 tablet 3 x /day as needed for Muscle Spasm  dicyclomine (BENTYL) 20 MG tablet TAKE 1 TABLET BY MOUTH 3 TO 4 TIMES PER DAY BEFORE MEALS AND AT BEDTIME AS NEEDED FOR NAUSEA OR CRAMPING OR BLOATING OR DIARRHEA   diltiazem (CARDIZEM CD) 120 MG 24 hr capsule Take  1 capsule  Daily  with Food for BP & Heart Rhythm   ezetimibe (ZETIA) 10 MG tablet Take  1 tablet  Daily  for Cholesterol   finasteride (PROSCAR) 5 MG tablet TAKE 1 TABLET BY MOUTH DAILY FOR PROSTATE   flecainide (TAMBOCOR) 50 MG tablet Take 1 & 1/2   tablet (75 mg)   2 x /day  (every 12 hours )        for Afib   hyoscyamine (LEVSIN SL) 0.125 MG SL tablet DISSOLVE 1 TO 2 TABLETS UNDER THE TONGUE FOUR TIMES DAILY AS NEEDED FOR ABDOMINAL CRAMPS OR NAUSEA OR DIARRHEA   LUTEIN PO Take 5 mg by mouth 2 (two) times daily.   RSV vaccine recomb adjuvanted (AREXVY) 120 MCG/0.5ML injection Inject into the muscle.   tamsulosin (FLOMAX) 0.4 MG CAPS capsule Take 1 tablet at Bedtime for Prostate   Vibegron (GEMTESA) 75 MG TABS Take  1 tablet  at Bedtime  for Bladder   XARELTO 15 MG TABS tablet TAKE 1 TABLET BY MOUTH DAILY FOR BLOOD CLOT PREVENTION OR ATRIAL FIBRILLATION   No current facility-administered medications on file prior to visit.    ROS: all negative except what is noted in the HPI.   Physical Exam:  BP (!) 158/80   Pulse 68   Temp 97.7 F (36.5 C)   Ht  (1.854 m)   Wt 202 lb (91.6 kg)   SpO2 99%   BMI 26.65 kg/m   General Appearance: NAD.  Awake, conversant and cooperative. Eyes: PERRLA, EOMs intact.  Sclera white.  Conjunctiva without erythema. Sinuses: No frontal/maxillary tenderness.  No nasal discharge. Nares patent.  ENT/Mouth: Ext aud canals clear.  Bilateral TMs w/DOL and without erythema or bulging. Hearing intact.  Posterior pharynx without swelling or exudate.  Tonsils without swelling or erythema.  Neck: Supple.  No masses, nodules or thyromegaly. Respiratory: Effort is regular with non-labored breathing. Breath sounds are equal bilaterally without rales, rhonchi, wheezing or stridor.  Cardio: RRR with no MRGs. Brisk peripheral pulses without edema.  Abdomen: Active BS in all four quadrants.  Soft and non-tender without guarding, rebound tenderness, hernias or masses. Lymphatics: Non tender without lymphadenopathy.  Musculoskeletal: Full ROM, 5/5 strength, normal ambulation.  No clubbing or cyanosis. Skin: Right elbow with moderate skin tear and superficial abrasion. Approximately 15 cm round area. Erythema and  ecchymosis.  No drainage.  Surrounding area intact and WNL.  Left lateral thigh with approximately 10 cm round flat well healed abrasion with surrounding ecchymosis.  Surrounding skin WNL.    Neuro: CN II-XII grossly normal. Normal muscle tone without cerebellar symptoms and intact sensation.   Psych: AO X 3,  appropriate mood and affect, insight and judgment.     Adela Glimpse, NP 11:48 AM [facility]

## 2022-06-08 NOTE — Patient Instructions (Signed)
Skin Tear A skin tear is a wound in which the top layers of skin peel off. This is a common problem for older people. It can also be a problem for people who take certain medicines for too long. To repair the skin, your doctor may use: Tape. Skin tape (adhesive) strips. A bandage (dressing) may also be placed over the tape or skin tape strips. Follow these instructions at home: Keep your wound clean Clean the wound as told by your doctor. You may be told to keep the wound dry for the first few days. If you are told to clean the wound: Wash the wound with mild soap and water, a wound cleanser, or a salt-water (saline) solution. If you use soap, rinse the wound with water to get all the soap off. Do not rub the wound dry. Pat the wound gently with a clean towel or let it air-dry. Change any bandage as told by your doctor. This includes changing the bandage if it gets wet, gets dirty, or starts to smell bad. To change your bandage: Wash your hands with soap and water for at least 20 seconds before and after you change your bandage. If you cannot use soap and water, use hand sanitizer. Leave tape or skin tape strips alone unless you are told to take them off. You may trim the edges of the tape strips if they curl up. Watch for signs of infection Check your wound every day for signs of infection. Check for: Redness, swelling, or pain. More fluid or blood. Warmth. Pus or a bad smell.  Protect your wound Do not scratch or pick at the wound. Protect the injured area until it has healed. Medicines Take or apply over-the-counter and prescription medicines only as told by your doctor. If you were prescribed an antibiotic medicine, take or apply it as told by your doctor. Do not stop using it even if your condition gets better. General instructions  Keep the bandage dry. Do not take baths, swim, use a hot tub, or do anything that puts your wound underwater. Ask your doctor about taking showers or  sponge baths. Keep all follow-up visits. Contact a doctor if: You have any of these signs of infection in your wound: Redness, swelling, or pain. More fluid or blood. Warmth. Pus or a bad smell. Get help right away if: You have a red streak that goes away from the skin tear. You have a fever and chills, and your symptoms get worse all of a sudden. Summary A skin tear is a wound in which the top layers of skin peel off. To repair the skin, your doctor may use tape or skin tape strips. Change any bandage as told by your doctor. Take or apply over-the-counter and prescription medicines only as told by your doctor. Contact a doctor if you have signs of infection. This information is not intended to replace advice given to you by your health care provider. Make sure you discuss any questions you have with your health care provider. Document Revised: 05/10/2019 Document Reviewed: 05/10/2019 Elsevier Patient Education  2023 [employer]  

## 2022-06-12 ENCOUNTER — Encounter: Payer: Self-pay | Admitting: Nurse Practitioner

## 2022-06-12 ENCOUNTER — Ambulatory Visit (INDEPENDENT_AMBULATORY_CARE_PROVIDER_SITE_OTHER): Payer: Medicare Other | Admitting: Nurse Practitioner

## 2022-06-12 VITALS — BP 158/82 | HR 66 | Temp 97.9°F | Ht 73.0 in | Wt 198.6 lb

## 2022-06-12 DIAGNOSIS — M4800 Spinal stenosis, site unspecified: Secondary | ICD-10-CM

## 2022-06-12 DIAGNOSIS — C61 Malignant neoplasm of prostate: Secondary | ICD-10-CM | POA: Diagnosis not present

## 2022-06-12 DIAGNOSIS — Z79899 Other long term (current) drug therapy: Secondary | ICD-10-CM

## 2022-06-12 DIAGNOSIS — Z0001 Encounter for general adult medical examination with abnormal findings: Secondary | ICD-10-CM | POA: Diagnosis not present

## 2022-06-12 DIAGNOSIS — Q2730 Arteriovenous malformation, site unspecified: Secondary | ICD-10-CM

## 2022-06-12 DIAGNOSIS — I4819 Other persistent atrial fibrillation: Secondary | ICD-10-CM | POA: Diagnosis not present

## 2022-06-12 DIAGNOSIS — Z8639 Personal history of other endocrine, nutritional and metabolic disease: Secondary | ICD-10-CM | POA: Diagnosis not present

## 2022-06-12 DIAGNOSIS — E559 Vitamin D deficiency, unspecified: Secondary | ICD-10-CM

## 2022-06-12 DIAGNOSIS — R0989 Other specified symptoms and signs involving the circulatory and respiratory systems: Secondary | ICD-10-CM

## 2022-06-12 DIAGNOSIS — E782 Mixed hyperlipidemia: Secondary | ICD-10-CM | POA: Diagnosis not present

## 2022-06-12 DIAGNOSIS — I4891 Unspecified atrial fibrillation: Secondary | ICD-10-CM

## 2022-06-12 DIAGNOSIS — R6889 Other general symptoms and signs: Secondary | ICD-10-CM

## 2022-06-12 DIAGNOSIS — R001 Bradycardia, unspecified: Secondary | ICD-10-CM

## 2022-06-12 DIAGNOSIS — Z0000 Encounter for general adult medical examination without abnormal findings: Secondary | ICD-10-CM

## 2022-06-12 NOTE — Progress Notes (Signed)
MEDICARE ANNUAL WELLNESS VISIT AND FOLLOW UP  Assessment:   Encounter for Medicare annual wellness exam Due annually Health maintenance reviewed   Labile hypertension Discussed DASH (Dietary Approaches to Stop Hypertension) DASH diet is lower in sodium than a typical American diet. Cut back on foods that are high in saturated fat, cholesterol, and trans fats. Eat more whole-grain foods, fish, poultry, and nuts Remain active and exercise as tolerated daily.  Monitor BP at home-Call if greater than 130/80.  Check CMP/CBC   Mixed hyperlipidemia Discussed lifestyle modifications. Recommended diet heavy in fruits and veggies, omega 3's. Decrease consumption of animal meats, cheeses, and dairy products. Remain active and exercise as tolerated. Continue to monitor. Check lipids/TSH   Prostate cancer (HCC) Continue to monitor PSA  Persistent atrial fibrillation: CHA2DS2Vasc = 3 (Age & HTN) Continue medications and cardio follow up NSR today  History of elevated glucose Education: Reviewed 'ABCs' of diabetes management  Discussed goals to be met and/or maintained include A1C (<7) Blood pressure (<130/80) Cholesterol (LDL <70) Continue Eye Exam yearly  Continue Dental Exam Q6 mo Discussed dietary recommendations Discussed Physical Activity  Check A1C  Medication management All medications discussed and reviewed in full. All questions and concerns regarding medications addressed.    Vitamin D deficiency Continue supplement for goal of 60-100 Monitor Vitamin D levels  AVM (arteriovenous malformation) Monitor  Spinal stenosis, unspecified spinal region Continue PT  Sinus bradycardia Monitor  Atrial fibrillation, unspecified type (HCC) Continue Diltazem Follows with [facility]  Orders Placed This Encounter  Procedures   CBC with Differential/Platelet   COMPLETE METABOLIC PANEL WITH GFR   Lipid panel   Hemoglobin A1c   VITAMIN D 25 Hydroxy  (Vit-D Deficiency, Fractures)    Notify office for further evaluation and treatment, questions or concerns if any reported s/s fail to improve.   The patient was advised to call back or seek an in-person evaluation if any symptoms worsen or if the condition fails to improve as anticipated.   Further disposition pending results of labs. Discussed med's effects and SE's.    I discussed the assessment and treatment plan with the patient. The patient was provided an opportunity to ask questions and all were answered. The patient agreed with the plan and demonstrated an understanding of the instructions.  Discussed med's effects and SE's. Screening labs and tests as requested with regular follow-up as recommended.  I provided 35 minutes of face-to-face time during this encounter including counseling, chart review, and critical decision making was preformed.  Today's Plan of Care is based on a patient-centered health care approach known as shared decision making - the decisions, tests and treatments allow for patient preferences and values to be balanced with clinical evidence.     Future Appointments  Date Time Provider Department Center  12/15/2022 11:00 AM Lucky Cowboy, MD [facility] None  06/15/2023 11:00 AM Adela Glimpse, NP [facility] None     Plan:   During the course of the visit the patient was educated and counseled about appropriate screening and preventive services including:   Pneumococcal vaccine  Influenza vaccine Prevnar 13 Td vaccine Screening electrocardiogram Colorectal cancer screening Diabetes screening Glaucoma screening Nutrition counseling    Subjective:  Eugene Friedman is a 89 y.o. WW male who presents for Medicare Annual Wellness Visit and 3 month follow up for HTN, hyperlipidemia, prediabetes, and vitamin D Def.   Overall he reports feeling well.    He continues to visit [place] in the summers.  Plans to leave 1st of June.    He is following  with Dr. 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 Continues to do PT and will follow up for surveillance.  He has persistent Afib x sept 2020, on flecainide, s/p failed cardioversion in 11/2018. He is on cardizem 120 mg daily.   Was seen at [facility] 08/13/21.  He was seen for persistent a fib and was in sinus rhythm followin cardioversion in April until yesterday 08/12/21 when he got an alert fro his smart watch for an episode of afib lasting several hours.  He naturally converted back.  He was instructed to continue flecainide 100 mg BID but can take and exgtra 50 mg once for an apisode of afib.  It was discussed that an alternate aniarrhythmic could be possible if he continues to have breakthrough--this was a video conference as he was in [place].  Denies any new or recent symptoms.  Follows with Dr. Liliane Shi for history of prostate cancer x 2007, active surveillance.    He has IBS.  Denies any recent blood in stool, black stool. No nausea, no weight loss.   BMI is Body mass index is 26.2 kg/m., he is working on diet and exercise. Wt Readings from Last 3 Encounters:  06/12/22 198 lb 9.6 oz (90.1 kg)  06/08/22 202 lb (91.6 kg)  03/11/22 198 lb 3.2 oz (89.9 kg)   His blood pressure has been controlled at home at home are under 120/60's, today their BP is BP: (!) 158/82  BP Readings from Last 3 Encounters:  06/12/22 (!) 158/82  06/08/22 (!) 158/80  03/11/22 138/70    He does workout. He denies chest pain, shortness of breath, dizziness.  He is on cholesterol medication and denies myalgias. His cholesterol is not at goal. The cholesterol last visit was:   Lab Results  Component Value Date   CHOL 190 06/12/2022   HDL 93 06/12/2022   LDLCALC 79 06/12/2022   TRIG 95 06/12/2022   CHOLHDL 2.0 06/12/2022    He has been working on diet and exercise for prediabetes, and denies paresthesia of the feet, polydipsia, polyuria  and visual disturbances. Last A1C in the office was:  Lab Results  Component Value Date   HGBA1C 5.4 06/12/2022   Last GFR Lab Results  Component Value Date   GFRNONAA 38 (L) 01/30/2022   Patient is on Vitamin D supplement.   Lab Results  Component Value Date   VD25OH 77 06/12/2022      Medication Review:   Current Outpatient Medications (Cardiovascular):    diltiazem (CARDIZEM CD) 120 MG 24 hr capsule, Take  1 capsule  Daily  with Food for BP & Heart Rhythm   ezetimibe (ZETIA) 10 MG tablet, Take  1 tablet  Daily  for Cholesterol   flecainide (TAMBOCOR) 50 MG tablet, Take 1 & 1/2  tablet (75 mg)   2 x /day  (every 12 hours )        for Afib    Current Outpatient Medications (Hematological):    XARELTO 15 MG TABS tablet, TAKE 1 TABLET BY MOUTH DAILY FOR BLOOD CLOT PREVENTION OR ATRIAL FIBRILLATION  Current Outpatient Medications (Other):    amoxicillin (AMOXIL) 250 MG capsule, Take  1 capsule  3 x /day  with Meals for Gum or Dental  Infection   Cholecalciferol (VITAMIN D-3) 5000 UNITS TABS, Take by mouth.   COVID-19 mRNA  vaccine 2023-2024 (COMIRNATY) syringe, Inject into the muscle.   COVID-19 mRNA vaccine 2023-2024 (COMIRNATY) syringe, Inject 0.3 mLs into the muscle.   cyclobenzaprine (FLEXERIL) 10 MG tablet, Take 1/2 to 1 tablet 3 x /day as needed for Muscle Spasm   dicyclomine (BENTYL) 20 MG tablet, TAKE 1 TABLET BY MOUTH 3 TO 4 TIMES PER DAY BEFORE MEALS AND AT BEDTIME AS NEEDED FOR NAUSEA OR CRAMPING OR BLOATING OR DIARRHEA   finasteride (PROSCAR) 5 MG tablet, TAKE 1 TABLET BY MOUTH DAILY FOR PROSTATE   hyoscyamine (LEVSIN SL) 0.125 MG SL tablet, DISSOLVE 1 TO 2 TABLETS UNDER THE TONGUE FOUR TIMES DAILY AS NEEDED FOR ABDOMINAL CRAMPS OR NAUSEA OR DIARRHEA   LUTEIN PO, Take 5 mg by mouth 2 (two) times daily.   RSV vaccine recomb adjuvanted (AREXVY) 120 MCG/0.5ML injection, Inject into the muscle.   tamsulosin (FLOMAX) 0.4 MG CAPS capsule, Take 1 tablet at Bedtime for  Prostate   Vibegron (GEMTESA) 75 MG TABS, Take  1 tablet  at Bedtime  for Bladder  Allergies: Allergies  Allergen Reactions   Celebrex [Celecoxib] Other (See Comments)    GI upset   Lipitor [Atorvastatin]    Ppd [Tuberculin Purified Protein Derivative] Other (See Comments)    Positive PPD   Adhesive [Tape] Rash    Zinc in the band-aid will cause a rash   Nickel Rash    Current Problems (verified) has Hyperlipidemia; Sinus bradycardia; Vitamin D deficiency; History of elevated glucose; Elevated prostate specific antigen (PSA); Labile hypertension; Medication management; Prostate cancer (HCC); Spinal stenosis; Persistent atrial fibrillation: CHA2DS2Vasc = 3 (Age & HTN); AVM (arteriovenous malformation); and Stage 3b chronic kidney disease (HCC) on their problem list.  Screening Tests Immunization History  Administered Date(s) Administered   COVID-19, mRNA, vaccine(Comirnaty)12 years and older 12/31/2021, 06/03/2022   H1N1 02/17/2007   Influenza Whole 12/05/2012   Influenza, High Dose Seasonal PF 11/14/2013, 11/20/2014, 12/16/2015, 10/30/2016, 02/04/2018, 11/22/2018, 11/23/2019, 12/09/2020, 11/20/2021   Influenza-Unspecified 12/05/2012   PFIZER(Purple Top)SARS-COV-2 Vaccination 03/09/2019, 03/30/2019, 11/07/2019   Pneumococcal Conjugate-13 12/05/2014   Pneumococcal Polysaccharide-23 03/08/2008, 05/18/2013   Respiratory Syncytial Virus Vaccine,Recomb Aduvanted(Arexvy) 01/29/2022   Td 06/14/2019   Tdap 02/17/2008, 02/11/2021   Zoster, Live 02/16/2005   Preventative care: Last colonoscopy: more than 10 years ago, will not get another due to age  Prior vaccinations: TD or Tdap: 2010 DUE Influenza: 11/2021 COVID vaccines completed 03/30/2019 05/2022 Pneumococcal: 2015 Prevnar13: 2016 Shingles/Zostavax: 2007  Names of Other Physician/Practitioners you currently use: 1. [facility] here for primary care 2. [place], eye doctor, last visit  10/2021 3. Rande Lawman, dentist, last visit 2023 Patient Care Team: Lucky Cowboy, MD as PCP - General (Internal Medicine) Nelson Chimes, MD as Consulting Physician (Ophthalmology) Nahser, Deloris Ping, MD as Consulting Physician (Cardiology) Darnell Level, MD as Consulting Physician (General Surgery) Valeria Batman, MD (Inactive) as Consulting Physician (Orthopedic Surgery)  Surgical: He  has a past surgical history that includes cervical neck mass  (07/08/06); Appendectomy; Mastoidectomy (Left); Flexible sigmoidoscopy; Knee surgery; Hemorrhoid surgery; Joint replacement; and Back Procedure (11/02/2018). Family His family history includes Cancer in his maternal grandmother; Diabetes in his maternal aunt, mother, and paternal aunt; Heart attack (age of onset: 69) in his father; Hypertension in his father; Stroke in his mother. Social history  He reports that he has never smoked. He has never used smokeless tobacco. He reports current alcohol use of about 10.0 standard drinks of alcohol per week. He reports that he does not  use drugs.  MEDICARE WELLNESS OBJECTIVES: Physical activity:   Cardiac risk factors:   Depression/mood screen:      12/09/2021   10:26 PM  Depression screen PHQ 2/9  Decreased Interest 0  Down, Depressed, Hopeless 0  PHQ - 2 Score 0    ADLs:     12/09/2021   10:27 PM 11/22/2021   11:03 PM  In your present state of health, do you have any difficulty performing the following activities:  Hearing? 0 0  Vision? 0 0  Difficulty concentrating or making decisions? 0 0  Walking or climbing stairs? 0 0  Dressing or bathing? 0 0  Doing errands, shopping? 0 0     Cognitive Testing  Alert? Yes  Normal Appearance?Yes  Oriented to person? Yes  Place? Yes   Time? Yes  Recall of three objects?  Yes  Can perform simple calculations? Yes  Displays appropriate judgment?Yes  Can read the correct time from a watch face?Yes  EOL planning:     Objective:   Today's Vitals    06/12/22 1126  BP: (!) 158/82  Pulse: 66  Temp: 97.9 F (36.6 C)  SpO2: 99%  Weight: 198 lb 9.6 oz (90.1 kg)  Height: 6\' 1"  (1.854 m)   Body mass index is 26.2 kg/m.  General appearance: alert, no distress, WD/WN, male HEENT: normocephalic, sclerae anicteric, TMs pearly, nares patent, no discharge or erythema, pharynx normal Oral cavity: MMM, no lesions Neck: supple, no lymphadenopathy, no thyromegaly, no masses Heart: RRR, normal S1, S2, no murmurs Lungs: CTA bilaterally, no wheezes, rhonchi, or rales Abdomen: +bs, soft, non tender, non distended, no masses, no hepatomegaly, no splenomegaly Musculoskeletal: nontender, no swelling, no obvious deformity Extremities: no edema, no cyanosis, no clubbing Pulses: 2+ symmetric, upper and lower extremities, normal cap refill Neurological: alert, oriented x 3, CN2-12 intact, strength normal upper extremities and lower extremities, sensation normal throughout, DTRs 2+ throughout, no cerebellar signs, gait normal Psychiatric: normal affect, behavior normal, pleasant   Medicare Attestation I have personally reviewed: The patient's medical and social history Their use of alcohol, tobacco or illicit drugs Their current medications and supplements The patient's functional ability including ADLs,fall risks, home safety risks, cognitive, and hearing and visual impairment Diet and physical activities Evidence for depression or mood disorders  The patient's weight, height, BMI, and visual acuity have been recorded in the chart.  I have made referrals, counseling, and provided education to the patient based on review of the above and I have provided the patient with a written personalized care plan for preventive services.     Adela Glimpse, NP   06/14/2022

## 2022-06-13 LAB — CBC WITH DIFFERENTIAL/PLATELET
Absolute Monocytes: 567 cells/uL (ref 200–950)
Basophils Absolute: 50 cells/uL (ref 0–200)
Basophils Relative: 0.9 %
Eosinophils Absolute: 160 cells/uL (ref 15–500)
Eosinophils Relative: 2.9 %
HCT: 38.2 % — ABNORMAL LOW (ref 38.5–50.0)
Hemoglobin: 12.8 g/dL — ABNORMAL LOW (ref 13.2–17.1)
Lymphs Abs: 1183 cells/uL (ref 850–3900)
MCH: 30.6 pg (ref 27.0–33.0)
MCHC: 33.5 g/dL (ref 32.0–36.0)
MCV: 91.4 fL (ref 80.0–100.0)
MPV: 9.3 fL (ref 7.5–12.5)
Monocytes Relative: 10.3 %
Neutro Abs: 3542 cells/uL (ref 1500–7800)
Neutrophils Relative %: 64.4 %
Platelets: 232 10*3/uL (ref 140–400)
RBC: 4.18 10*6/uL — ABNORMAL LOW (ref 4.20–5.80)
RDW: 13.2 % (ref 11.0–15.0)
Total Lymphocyte: 21.5 %
WBC: 5.5 10*3/uL (ref 3.8–10.8)

## 2022-06-13 LAB — COMPLETE METABOLIC PANEL WITH GFR
AG Ratio: 2.2 (calc) (ref 1.0–2.5)
ALT: 21 U/L (ref 9–46)
AST: 20 U/L (ref 10–35)
Albumin: 4.3 g/dL (ref 3.6–5.1)
Alkaline phosphatase (APISO): 89 U/L (ref 35–144)
BUN/Creatinine Ratio: 21 (calc) (ref 6–22)
BUN: 27 mg/dL — ABNORMAL HIGH (ref 7–25)
CO2: 27 mmol/L (ref 20–32)
Calcium: 9.2 mg/dL (ref 8.6–10.3)
Chloride: 103 mmol/L (ref 98–110)
Creat: 1.29 mg/dL — ABNORMAL HIGH (ref 0.70–1.22)
Globulin: 2 g/dL (calc) (ref 1.9–3.7)
Glucose, Bld: 94 mg/dL (ref 65–99)
Potassium: 4.7 mmol/L (ref 3.5–5.3)
Sodium: 136 mmol/L (ref 135–146)
Total Bilirubin: 0.6 mg/dL (ref 0.2–1.2)
Total Protein: 6.3 g/dL (ref 6.1–8.1)
eGFR: 52 mL/min/{1.73_m2} — ABNORMAL LOW (ref >=60)

## 2022-06-13 LAB — HEMOGLOBIN A1C
Hgb A1c MFr Bld: 5.4 % of total Hgb (ref <5.7)
Mean Plasma Glucose: 108 mg/dL
eAG (mmol/L): 6 mmol/L

## 2022-06-13 LAB — LIPID PANEL
Cholesterol: 190 mg/dL (ref <200)
HDL: 93 mg/dL (ref >=40)
LDL Cholesterol (Calc): 79 mg/dL (calc)
Non-HDL Cholesterol (Calc): 97 mg/dL (calc) (ref <130)
Total CHOL/HDL Ratio: 2 (calc) (ref <5.0)
Triglycerides: 95 mg/dL (ref <150)

## 2022-06-13 LAB — VITAMIN D 25 HYDROXY (VIT D DEFICIENCY, FRACTURES): Vit D, 25-Hydroxy: 77 ng/mL (ref 30–100)

## 2022-06-14 NOTE — Patient Instructions (Signed)

## 2022-06-16 ENCOUNTER — Other Ambulatory Visit: Payer: Self-pay | Admitting: Nurse Practitioner

## 2022-06-24 ENCOUNTER — Other Ambulatory Visit: Payer: Self-pay | Admitting: Internal Medicine

## 2022-06-24 DIAGNOSIS — N138 Other obstructive and reflux uropathy: Secondary | ICD-10-CM

## 2022-06-24 DIAGNOSIS — E782 Mixed hyperlipidemia: Secondary | ICD-10-CM

## 2022-06-24 DIAGNOSIS — N3281 Overactive bladder: Secondary | ICD-10-CM

## 2022-06-24 DIAGNOSIS — K589 Irritable bowel syndrome without diarrhea: Secondary | ICD-10-CM

## 2022-06-24 DIAGNOSIS — I4891 Unspecified atrial fibrillation: Secondary | ICD-10-CM

## 2022-06-24 MED ORDER — GEMTESA 75 MG PO TABS: ORAL_TABLET | ORAL | 3 refills | Status: DC

## 2022-06-24 MED ORDER — FINASTERIDE 5 MG PO TABS: ORAL_TABLET | ORAL | 3 refills | Status: DC

## 2022-06-24 MED ORDER — RIVAROXABAN 15 MG PO TABS: ORAL_TABLET | ORAL | 3 refills | Status: DC

## 2022-06-24 MED ORDER — TAMSULOSIN HCL 0.4 MG PO CAPS: ORAL_CAPSULE | ORAL | 3 refills | Status: DC

## 2022-06-24 MED ORDER — EZETIMIBE 10 MG PO TABS: ORAL_TABLET | ORAL | 3 refills | Status: DC

## 2022-06-24 MED ORDER — DILTIAZEM HCL ER COATED BEADS 120 MG PO CP24: ORAL_CAPSULE | ORAL | 3 refills | Status: DC

## 2022-06-24 MED ORDER — FLECAINIDE ACETATE 50 MG PO TABS: ORAL_TABLET | ORAL | 3 refills | Status: DC

## 2022-06-24 MED ORDER — HYOSCYAMINE SULFATE 0.125 MG SL SUBL: SUBLINGUAL_TABLET | SUBLINGUAL | 1 refills | Status: DC

## 2022-06-26 DIAGNOSIS — I358 Other nonrheumatic aortic valve disorders: Secondary | ICD-10-CM | POA: Insufficient documentation

## 2022-09-03 ENCOUNTER — Encounter: Payer: Self-pay | Admitting: Internal Medicine

## 2022-10-24 ENCOUNTER — Other Ambulatory Visit: Payer: Self-pay | Admitting: Nurse Practitioner

## 2022-10-24 DIAGNOSIS — K582 Mixed irritable bowel syndrome: Secondary | ICD-10-CM

## 2022-10-28 ENCOUNTER — Other Ambulatory Visit (HOSPITAL_BASED_OUTPATIENT_CLINIC_OR_DEPARTMENT_OTHER): Payer: Self-pay

## 2022-10-28 MED ORDER — COMIRNATY 30 MCG/0.3ML IM SUSY
0.3000 mL | PREFILLED_SYRINGE | Freq: Once | INTRAMUSCULAR | 0 refills | Status: AC
  Filled 2022-10-28: qty 0.3, 1d supply, fill #0

## 2022-12-14 NOTE — Progress Notes (Incomplete)
Annual  Screening/Preventative Visit  & Comprehensive Evaluation & Examination \  Future Appointments  Date Time Provider Department  12/09/2021                    cpe 11:00 AM Lucky Cowboy, MD [facility]  06/12/2022                     wellness 11:00 AM Adela Glimpse, NP [facility]  12/15/2022                    cpe 11:00 AM Lucky Cowboy, MD [facility]          This very nice 89 y.o. WWM with HTN, HLD, Pre-Diabetes, ASHD/cAfib,  IBS  and Vitamin D Deficiency  presents for a comprehensive evaluation and management of multiple medical co-morbidities.   Patient has hx/o Gleason 6 Prostate Ca followed by Dr Liliane Shi (active surveillance) .                                                   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 On March 15, he had 2sd vascular procedure for embolization of the AVM spinal cord fistula.         Patient's HTN predates since  2001. Patient's BP has been controlled at home.  Today's BP is at goal -  140/82.  Patient had a Negative Cardiolite in 2011 and Negative ETT in 2017.  Then he was dx'd in Sept 2020 with Afib (Sept 2020) (CHADsVASc = 3) and he was started on Xarelto.   He failed CV x 2 at [facility] in Oct & Dec 2020. Dr's at [facility]  advised  tapering his Xarelto dose from 20 to 15 mg based on age & renal functions.  Patient has CKD3a near 3b (GFR 52).  Patient's local Cardiologist is Dr Herbie [place]. Patient denies any cardiac symptoms as chest pain, palpitations, shortness of breath, dizziness or ankle swelling.       Patient's hyperlipidemia is  not controlled with diet and he is statin intolerant. Patient denies myalgias or other medication SE's. Last lipids were at goal :  Lab Results  Component Value Date   CHOL 190 06/12/2022   HDL 93 06/12/2022   LDLCALC 79 06/12/2022   TRIG 95 06/12/2022   CHOLHDL 2.0 06/12/2022          Patient has hx/o prediabetes (A1c 6.1% /2014) and patient denies reactive hypoglycemic symptoms, visual blurring, diabetic polys or paresthesias. Last A1c was normal & at goal :  Lab Results  Component Value Date   HGBA1C 5.2 12/09/2020         Finally, patient has history of Vitamin D Deficiency ("42" /2009) and last vitamin D was at goal :   Lab Results  Component Value Date   VD25OH 74 12/09/2020       Current Outpatient Medications  Medication Instructions  . amoxicillin 250 MG capsule Take  1 capsule  3 x /day  with Meals for Gum or Dental  Infection  . VITAMIN D   5000 UNITS  Take daily  . cyclobenzaprine 10 MG tablet Take 1/2 to 1 tablet 3 x /day as needed   . dicyclomine 20 MG tablet TAKE  1 TABLET 3 TO 4 TIMES PER DAY   . diltiazem CD 120 MG  Take  1 capsule  Daily  with Food   . finasteride 5 MG tablet Take 1 tablet Daily for Prostate  . flecainide 50 MG tablet Take 1 & 1/2  tablet  2 x /day    . hyoscyamine  SL 0.125 MG SL tablet DISSOLVE 1 TO 2 TABLETS SL  4 x /day as needed  . LUTEIN  5 mg, Oral, 2 times daily  . tamsulosin 0.4 MG CAPS capsule Take 1 tablet at Bedtime   . Vibegron (GEMTESA) 75 MG TABS Take  1 tablet  at Bedtime  f  . XARELTO 15 MG TABS tablet TAKE 1 TABLET  DAILY     Allergies  Allergen Reactions  . Celebrex [Celecoxib] Other (See Comments)    GI upset  . Lipitor [Atorvastatin]   . Ppd [Tuberculin Purified Protein Derivative] Other (See Comments)    Positive PPD  . Adhesive [Tape] Rash    Zinc in the band-aid will cause a rash  . Nickel Rash     Past Medical History:  Diagnosis Date  . Allergic rhinitis   . Atrial fibrillation by electrocardiogram (HCC) 10/2018   Was started on calcium channel blocker, beta-blocker and Xarelto  . Cancer [facility])    prostate  . DJD (degenerative joint disease)   . Hypertension    Borderline   . Sinus bradycardia   . Spinal stenosis    Complicated by dural AVM  . Vitamin D deficiency       Health Maintenance  Topic Date Due  . Zoster Vaccines- Shingrix (1 of 2) Never done  . COVID-19 Vaccine (4 - Booster for Pfizer series) 01/02/2020  . INFLUENZA VACCINE  09/16/2020  . TETANUS/TDAP  06/13/2029  . Pneumonia Vaccine 57+ Years old  Completed  . HPV VACCINES  Aged Out     Immunization History  Administered Date(s) Administered  . H1N1 02/17/2007  . Influenza  12/05/2012  . Influenza, High Dose  10/30/2016, 02/04/2018, 11/22/2018, 11/23/2019  . PFIZER SARS-COV-2 Vacc 03/09/2019, 03/30/2019, 11/07/2019  . Pneumococcal - 13 12/05/2014  . Pneumococcal - 23 03/08/2008, 05/18/2013  . Td 06/14/2019  . Tdap 02/17/2008  . Zoster, Live 02/16/2005    Last Colon - aged out  Past Surgical History:  Procedure Laterality Date  . APPENDECTOMY    . Back Procedure  11/02/2018   [facility] -vascular neuroradiology occlusion of dural AVM  . cervical neck mass   07/08/06   Left Excision left posterior cervical neck mass (2.5 cm). SURGEON:  Velora Heckler, M.D., Carilyn Goodpasture 07/08/06 and 12/05/07  . FLEXIBLE SIGMOIDOSCOPY    . HEMORRHOID SURGERY    . JOINT REPLACEMENT     Left knee 2013  . KNEE SURGERY    . MASTOIDECTOMY Left      Family History  Problem Relation Age of Onset  . Stroke Mother   . Diabetes Mother   . Heart attack Father 26  . Hypertension Father   . Diabetes Maternal Aunt   . Diabetes Paternal Aunt   . Cancer Maternal Grandmother      Social History   Widowed  - has 2 daughters & 2 GrC  . Occupation: RETIRED PRESIDENT / Environmental consultant: [employer]   Tobacco Use  . Smoking status: Never  . Smokeless tobacco: Never  Vaping Use  . Vaping Use: Never used  Substance Use Topics  .  Alcohol use: Yes    Alcohol/week: 10.0 standard drinks    Types: 10 Glasses of wine per week  . Drug use: No      ROS Constitutional: Denies fever, chills, weight loss/gain, headaches, insomnia,  night sweats or change in appetite. Does c/o fatigue. Eyes: Denies  redness, blurred vision, diplopia, discharge, itchy or watery eyes.  ENT: Denies discharge, congestion, post nasal drip, epistaxis, sore throat, earache, hearing loss, dental pain, Tinnitus, Vertigo, Sinus pain or snoring.  Cardio: Denies chest pain, palpitations, irregular heartbeat, syncope, dyspnea, diaphoresis, orthopnea, PND, claudication or edema Respiratory: denies cough, dyspnea, DOE, pleurisy, hoarseness, laryngitis or wheezing.  Gastrointestinal: Denies dysphagia, heartburn, reflux, water brash, pain, cramps, nausea, vomiting, bloating, diarrhea, constipation, hematemesis, melena, hematochezia, jaundice or hemorrhoids Genitourinary: Denies dysuria, frequency, discharge, hematuria or flank pain. Has urgency, nocturia x 2-3 & occasional hesitancy. Musculoskeletal: Denies arthralgia, myalgia, stiffness, Jt. Swelling, pain, limp or strain/sprain. Denies Falls. Skin: Denies puritis, rash, hives, warts, acne, eczema or change in skin lesion Neuro: No weakness, tremor, incoordination, spasms, paresthesia or pain Psychiatric: Denies confusion, memory loss or sensory loss. Denies Depression. Endocrine: Denies change in weight, skin, hair change, nocturia, and paresthesia, diabetic polys, visual blurring or hyper / hypo glycemic episodes.  Heme/Lymph: No excessive bleeding, bruising or enlarged lymph nodes.   Physical Exam  There were no vitals taken for this visit.  General Appearance: Well nourished and well groomed and in no apparent distress.  Eyes: PERRLA, EOMs, conjunctiva no swelling or erythema, normal fundi and vessels. Sinuses: No frontal/maxillary tenderness ENT/Mouth: EACs patent / TMs  nl. Nares clear without erythema, swelling, mucoid exudates. Oral hygiene is good. No erythema, swelling, or exudate. Tongue normal, non-obstructing. Tonsils not swollen or erythematous. Hearing normal.  Neck: Supple, thyroid not palpable. No bruits, nodes or JVD. Respiratory: Respiratory effort  normal.  BS equal and clear bilateral without rales, rhonci, wheezing or stridor. Cardio: Heart sounds are normal with regular rate and rhythm and no murmurs, rubs or gallops. Peripheral pulses are normal and equal bilaterally without edema. No aortic or femoral bruits. Chest: symmetric with normal excursions and percussion.  Abdomen: Soft, with Nl bowel sounds. Nontender, no guarding, rebound, hernias, masses, or organomegaly.  Lymphatics: Non tender without lymphadenopathy.  Musculoskeletal: Full ROM all peripheral extremities, joint stability, 5/5 strength, and sl broad based shuffling gait. Skin: Warm and dry without rashes, lesions, cyanosis, clubbing or  ecchymosis.  Neuro: Cranial nerves intact, reflexes equal bilaterally. Normal muscle tone, no cerebellar symptoms. Sensation intact.  Pysch: Alert and oriented X 3 with normal affect, insight and judgment appropriate.   Assessment and Plan  1. Essential hypertension  - EKG 12-Lead - [place], RETROPERITNL ABD,  LTD - Urinalysis, Routine w reflex microscopic - Microalbumin / creatinine urine ratio - CBC with Differential/Platelet - COMPLETE METABOLIC PANEL WITH GFR - Magnesium - TSH  2. Hyperlipidemia, mixed  - EKG 12-Lead - [place], RETROPERITNL ABD,  LTD - Lipid panel - TSH  3. Abnormal glucose  - EKG 12-Lead - [place], RETROPERITNL ABD,  LTD - Hemoglobin A1c - Insulin, random  4. Vitamin D deficiency  - VITAMIN D 25 Hydroxy  5. Chronic atrial fibrillation (HCC)  - EKG 12-Lead - [place], RETROPERITNL ABD,  LTD - TSH  6. Stage 3a chronic kidney disease (CKD) (HCC)  - EKG 12-Lead - [place], RETROPERITNL ABD,  LTD - Urinalysis, Routine w reflex microscopic - Microalbumin / creatinine urine ratio - PTH, intact and calcium  7. History of prostate cancer  -  PSA  8. Screening for prostate cancer  - PSA  9. Screening for colorectal cancer  - POC Hemoccult Bld/Stl   10. Screening for heart disease  - EKG 12-Lead  11. FHx:  heart disease  - EKG 12-Lead - [place], RETROPERITNL ABD,  LTD  12. Screening for AAA (aortic abdominal aneurysm)  - [place], RETROPERITNL ABD,  LTD  13. Medication management  - Urinalysis, Routine w reflex microscopic - Microalbumin / creatinine urine ratio - CBC with Differential/Platelet - COMPLETE METABOLIC PANEL WITH GFR - Magnesium - Lipid panel - TSH - Hemoglobin A1c - Insulin, random - VITAMIN D 25 Hydroxy          Patient was counseled in prudent diet, weight control to achieve/maintain BMI less than 25, BP monitoring, regular exercise and medications as discussed.  Discussed med effects and SE's. Routine screening labs and tests as requested with regular follow-up as recommended. Over 40 minutes of exam, counseling, chart review and high complex critical decision making was performed   Marinus Maw, MD

## 2022-12-14 NOTE — Progress Notes (Signed)
Annual  Screening/Preventative Visit  & Comprehensive Evaluation & Examination   Future Appointments  Date Time Provider Department  03/17/2023 11:30 AM Adela Glimpse, NP [facility]  06/15/2023 11:00 AM Adela Glimpse, NP [facility]  12/23/2023 11:00 AM Lucky Cowboy, MD [facility]         This very nice 89 y.o. WWM with HTN, HLD, Pre-Diabetes, ASHD/cAfib,  IBS  and Vitamin D Deficiency  presents for a comprehensive evaluation and management of multiple medical co-morbidities.   Patient has hx/o Gleason 6 Prostate Ca followed by Dr Liliane Shi (active surveillance) .                                                   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 On March 15, he had 2sd vascular procedure for embolization of the AVM spinal cord fistula.         Patient's HTN predates since  2001. Patient's BP has been controlled at home.  Today's BP is at goal -  138/80 .  Patient had a Negative Cardiolite in 2011 and Negative ETT in 2017.  Then he was dx'd in Sept 2020 with Afib (Sept 2020) (CHADsVASc = 3) and he was started on Xarelto.   He failed CV x 2 at [facility] in Oct & Dec 2020. Dr's at [facility]  advised  tapering his Xarelto dose from 20 to 15 mg based on age & renal functions.  Patient has CKD3a near 3b (GFR 52).  Patient's local Cardiologist is Dr Herbie [place]. Patient denies any cardiac symptoms as chest pain, palpitations, shortness of breath, dizziness or ankle swelling.       Patient's hyperlipidemia is  not controlled with diet and he is statin intolerant. Patient denies myalgias or other medication SE's. Last lipids were at goal :  Lab Results  Component Value Date   CHOL 190 06/12/2022   HDL 93 06/12/2022   LDLCALC 79 06/12/2022   TRIG 95 06/12/2022   CHOLHDL 2.0 06/12/2022         Patient has hx/o prediabetes (A1c 6.1% /2014) and patient denies reactive  hypoglycemic symptoms, visual blurring, diabetic polys or paresthesias. Last A1c was normal & at goal :  Lab Results  Component Value Date   HGBA1C 5.2 12/09/2020         Finally, patient has history of Vitamin D Deficiency ("42" /2009) and last vitamin D was at goal :   Lab Results  Component Value Date   VD25OH 74 12/09/2020       Current Outpatient Medications  Medication Instructions   amoxicillin 250 MG capsule Take  1 capsule  3 x /day  with Meals for Gum or Dental  Infection   VITAMIN D   5000 UNITS  Take daily   cyclobenzaprine 10 MG tablet Take 1/2 to 1 tablet 3 x /day as needed    dicyclomine 20 MG tablet TAKE 1 TABLET 3 TO 4 TIMES PER DAY    diltiazem CD 120 MG  Take  1 capsule  Daily  with Food    finasteride 5 MG tablet Take 1 tablet Daily for Prostate   flecainide 50 MG tablet Take 1 & 1/2  tablet  2 x /day     hyoscyamine  SL  0.125 MG SL tablet DISSOLVE 1 TO 2 TABLETS SL  4 x /day as needed   LUTEIN  5 mg, Oral, 2 times daily   tamsulosin 0.4 MG CAPS capsule Take 1 tablet at Bedtime    Vibegron (GEMTESA) 75 MG TABS Take  1 tablet  at Bedtime  f   XARELTO 15 MG TABS tablet TAKE 1 TABLET  DAILY     Allergies  Allergen Reactions   Celebrex [Celecoxib] Other (See Comments)    GI upset   Lipitor [Atorvastatin]    Ppd [Tuberculin Purified Protein Derivative] Other (See Comments)    Positive PPD   Adhesive [Tape] Rash    Zinc in the band-aid will cause a rash   Nickel Rash     Past Medical History:  Diagnosis Date   Allergic rhinitis    Atrial fibrillation by electrocardiogram (HCC) 10/2018   Was started on calcium channel blocker, beta-blocker and Xarelto   Cancer (HCC)    prostate   DJD (degenerative joint disease)    Hypertension    Borderline    Sinus bradycardia    Spinal stenosis    Complicated by dural AVM   Vitamin D deficiency      Health Maintenance  Topic Date Due   Zoster Vaccines- Shingrix (1 of 2) Never done   COVID-19 Vaccine (4  - Booster for Pfizer series) 01/02/2020   INFLUENZA VACCINE  09/16/2020   TETANUS/TDAP  06/13/2029   Pneumonia Vaccine 78+ Years old  Completed   HPV VACCINES  Aged Out     Immunization History  Administered Date(s) Administered   H1N1 02/17/2007   Influenza  12/05/2012   Influenza, High Dose  10/30/2016, 02/04/2018, 11/22/2018, 11/23/2019   PFIZER SARS-COV-2 Vacc 03/09/2019, 03/30/2019, 11/07/2019   Pneumococcal - 13 12/05/2014   Pneumococcal - 23 03/08/2008, 05/18/2013   Td 06/14/2019   Tdap 02/17/2008   Zoster, Live 02/16/2005    Last Colon - aged out  Past Surgical History:  Procedure Laterality Date   APPENDECTOMY     Back Procedure  11/02/2018   [facility] -vascular neuroradiology occlusion of dural AVM   cervical neck mass   07/08/06   Left Excision left posterior cervical neck mass (2.5 cm). SURGEON:  Velora Heckler, M.D., FACS 07/08/06 and 12/05/07   FLEXIBLE SIGMOIDOSCOPY     HEMORRHOID SURGERY     JOINT REPLACEMENT     Left knee 2013   KNEE SURGERY     MASTOIDECTOMY Left      Family History  Problem Relation Age of Onset   Stroke Mother    Diabetes Mother    Heart attack Father 67   Hypertension Father    Diabetes Maternal Aunt    Diabetes Paternal Aunt    Cancer Maternal Grandmother      Social History   Widowed  - has 2 daughters & 2 GrC   Occupation: RETIRED PRESIDENT / Environmental consultant: [employer]   Tobacco Use   Smoking status: Never   Smokeless tobacco: Never  Vaping Use   Vaping Use: Never used  Substance Use Topics   Alcohol use: Yes    Alcohol/week: 10.0 standard drinks    Types: 10 Glasses of wine per week   Drug use: No      ROS Constitutional: Denies fever, chills, weight loss/gain, headaches, insomnia,  night sweats or change in appetite. Does c/o fatigue. Eyes: Denies redness, blurred vision, diplopia, discharge, itchy or watery  eyes.  ENT: Denies discharge, congestion, post nasal drip, epistaxis, sore throat,  earache, hearing loss, dental pain, Tinnitus, Vertigo, Sinus pain or snoring.  Cardio: Denies chest pain, palpitations, irregular heartbeat, syncope, dyspnea, diaphoresis, orthopnea, PND, claudication or edema Respiratory: denies cough, dyspnea, DOE, pleurisy, hoarseness, laryngitis or wheezing.  Gastrointestinal: Denies dysphagia, heartburn, reflux, water brash, pain, cramps, nausea, vomiting, bloating, diarrhea, constipation, hematemesis, melena, hematochezia, jaundice or hemorrhoids Genitourinary: Denies dysuria, frequency, discharge, hematuria or flank pain. Has urgency, nocturia x 2-3 & occasional hesitancy. Musculoskeletal: Denies arthralgia, myalgia, stiffness, Jt. Swelling, pain, limp or strain/sprain. Denies Falls. Skin: Denies puritis, rash, hives, warts, acne, eczema or change in skin lesion Neuro: No weakness, tremor, incoordination, spasms, paresthesia or pain Psychiatric: Denies confusion, memory loss or sensory loss. Denies Depression. Endocrine: Denies change in weight, skin, hair change, nocturia, and paresthesia, diabetic polys, visual blurring or hyper / hypo glycemic episodes.  Heme/Lymph: No excessive bleeding, bruising or enlarged lymph nodes.   Physical Exam  BP 138/80   Pulse 76   Temp 98 F (36.7 C)   Resp 16   Ht 6\' 1"  (1.854 m)   Wt 202 lb 3.2 oz (91.7 kg)   SpO2 97%   BMI 26.68 kg/m   General Appearance: Well nourished and well groomed and in no apparent distress.  Eyes: PERRLA, EOMs, conjunctiva no swelling or erythema, normal fundi and vessels. Sinuses: No frontal/maxillary tenderness ENT/Mouth: EACs patent / TMs  nl. Nares clear without erythema, swelling, mucoid exudates. Oral hygiene is good. No erythema, swelling, or exudate. Tongue normal, non-obstructing. Tonsils not swollen or erythematous. Hearing normal.  Neck: Supple, thyroid not palpable. No bruits, nodes or JVD. Respiratory: Respiratory effort normal.  BS equal and clear bilateral without  rales, rhonci, wheezing or stridor. Cardio: Heart sounds are normal with regular rate and rhythm and no murmurs, rubs or gallops. Peripheral pulses are normal and equal bilaterally without edema. No aortic or femoral bruits. Chest: symmetric with normal excursions and percussion.  Abdomen: Soft, with Nl bowel sounds. Nontender, no guarding, rebound, hernias, masses, or organomegaly.  Lymphatics: Non tender without lymphadenopathy.  Musculoskeletal: Full ROM all peripheral extremities, joint stability, 5/5 strength, and sl broad based shuffling gait. Skin: Warm and dry without rashes, lesions, cyanosis, clubbing or  ecchymosis.  Neuro: Cranial nerves intact, reflexes equal bilaterally. Normal muscle tone, no cerebellar symptoms. Sensation intact.  Pysch: Alert and oriented X 3 with normal affect, insight and judgment appropriate.   Assessment and Plan  1. Essential hypertension  - EKG 12-Lead - [place], RETROPERITNL ABD,  LTD - Urinalysis, Routine w reflex microscopic - Microalbumin / creatinine urine ratio - CBC with Differential/Platelet - COMPLETE METABOLIC PANEL WITH GFR - Magnesium - TSH   2. Hyperlipidemia, mixed  - EKG 12-Lead - [place], RETROPERITNL ABD,  LTD - Lipid panel - TSH   3. Abnormal glucose  - EKG 12-Lead - [place], RETROPERITNL ABD,  LTD - Hemoglobin A1c - Insulin, random   4. Vitamin D deficiency  - VITAMIN D 25 Hydroxy   5. Chronic atrial fibrillation (HCC)  - EKG 12-Lead - [place], RETROPERITNL ABD,  LTD - TSH   6. Stage 3a chronic kidney disease (CKD) (HCC)  - EKG 12-Lead - [place], RETROPERITNL ABD,  LTD - Urinalysis, Routine w reflex microscopic - Microalbumin / creatinine urine ratio - PTH, intact and calcium   7. History of prostate cancer  - PSA   8. Screening for prostate cancer  - PSA   9. Screening for colorectal  cancer  - POC Hemoccult Bld/Stl    10. Screening for heart disease  - EKG 12-Lead   11. FHx: heart disease  - EKG  12-Lead - [place], RETROPERITNL ABD,  LTD   12. Screening for AAA (aortic abdominal aneurysm)  - [place], RETROPERITNL ABD,  LTD   13. Medication management  - Urinalysis, Routine w reflex microscopic - Microalbumin / creatinine urine ratio - CBC with Differential/Platelet - COMPLETE METABOLIC PANEL WITH GFR - Magnesium - Lipid panel - TSH - Hemoglobin A1c - Insulin, random - VITAMIN D 25 Hydroxy          Patient was counseled in prudent diet, weight control to achieve/maintain BMI less than 25, BP monitoring, regular exercise and medications as discussed.  Discussed med effects and SE's. Routine screening labs and tests as requested with regular follow-up as recommended. Over 40 minutes of exam, counseling, chart review and high complex critical decision making was performed   Marinus Maw, MD

## 2022-12-14 NOTE — Patient Instructions (Signed)

## 2022-12-15 ENCOUNTER — Ambulatory Visit: Payer: Medicare Other | Admitting: Internal Medicine

## 2022-12-15 ENCOUNTER — Encounter: Payer: Self-pay | Admitting: Internal Medicine

## 2022-12-15 VITALS — BP 138/80 | HR 76 | Temp 98.0°F | Resp 16 | Ht 73.0 in | Wt 202.2 lb

## 2022-12-15 DIAGNOSIS — Z125 Encounter for screening for malignant neoplasm of prostate: Secondary | ICD-10-CM

## 2022-12-15 DIAGNOSIS — E782 Mixed hyperlipidemia: Secondary | ICD-10-CM | POA: Diagnosis not present

## 2022-12-15 DIAGNOSIS — E559 Vitamin D deficiency, unspecified: Secondary | ICD-10-CM

## 2022-12-15 DIAGNOSIS — Z136 Encounter for screening for cardiovascular disorders: Secondary | ICD-10-CM

## 2022-12-15 DIAGNOSIS — Z1211 Encounter for screening for malignant neoplasm of colon: Secondary | ICD-10-CM

## 2022-12-15 DIAGNOSIS — I10 Essential (primary) hypertension: Secondary | ICD-10-CM

## 2022-12-15 DIAGNOSIS — Z8546 Personal history of malignant neoplasm of prostate: Secondary | ICD-10-CM

## 2022-12-15 DIAGNOSIS — R7309 Other abnormal glucose: Secondary | ICD-10-CM

## 2022-12-15 DIAGNOSIS — Z23 Encounter for immunization: Secondary | ICD-10-CM | POA: Diagnosis not present

## 2022-12-15 DIAGNOSIS — N1831 Chronic kidney disease, stage 3a: Secondary | ICD-10-CM

## 2022-12-15 DIAGNOSIS — I482 Chronic atrial fibrillation, unspecified: Secondary | ICD-10-CM

## 2022-12-15 DIAGNOSIS — Z79899 Other long term (current) drug therapy: Secondary | ICD-10-CM

## 2022-12-15 DIAGNOSIS — Z8249 Family history of ischemic heart disease and other diseases of the circulatory system: Secondary | ICD-10-CM | POA: Diagnosis not present

## 2022-12-16 LAB — COMPLETE METABOLIC PANEL WITH GFR
AG Ratio: 1.7 (calc) (ref 1.0–2.5)
ALT: 18 U/L (ref 9–46)
AST: 17 U/L (ref 10–35)
Albumin: 4.3 g/dL (ref 3.6–5.1)
Alkaline phosphatase (APISO): 85 U/L (ref 35–144)
BUN/Creatinine Ratio: 23 (calc) — ABNORMAL HIGH (ref 6–22)
BUN: 34 mg/dL — ABNORMAL HIGH (ref 7–25)
CO2: 27 mmol/L (ref 20–32)
Calcium: 9.5 mg/dL (ref 8.6–10.3)
Chloride: 104 mmol/L (ref 98–110)
Creat: 1.49 mg/dL — ABNORMAL HIGH (ref 0.70–1.22)
Globulin: 2.5 g/dL (ref 1.9–3.7)
Glucose, Bld: 93 mg/dL (ref 65–99)
Potassium: 4.9 mmol/L (ref 3.5–5.3)
Sodium: 139 mmol/L (ref 135–146)
Total Bilirubin: 0.5 mg/dL (ref 0.2–1.2)
Total Protein: 6.8 g/dL (ref 6.1–8.1)
eGFR: 44 mL/min/{1.73_m2} — ABNORMAL LOW (ref >=60)

## 2022-12-16 LAB — CBC WITH DIFFERENTIAL/PLATELET
Absolute Lymphocytes: 1300 {cells}/uL (ref 850–3900)
Absolute Monocytes: 473 {cells}/uL (ref 200–950)
Basophils Absolute: 31 {cells}/uL (ref 0–200)
Basophils Relative: 0.6 %
Eosinophils Absolute: 130 {cells}/uL (ref 15–500)
Eosinophils Relative: 2.5 %
HCT: 39.6 % (ref 38.5–50.0)
Hemoglobin: 13 g/dL — ABNORMAL LOW (ref 13.2–17.1)
MCH: 30.4 pg (ref 27.0–33.0)
MCHC: 32.8 g/dL (ref 32.0–36.0)
MCV: 92.7 fL (ref 80.0–100.0)
MPV: 9.3 fL (ref 7.5–12.5)
Monocytes Relative: 9.1 %
Neutro Abs: 3266 {cells}/uL (ref 1500–7800)
Neutrophils Relative %: 62.8 %
Platelets: 237 10*3/uL (ref 140–400)
RBC: 4.27 10*6/uL (ref 4.20–5.80)
RDW: 12.8 % (ref 11.0–15.0)
Total Lymphocyte: 25 %
WBC: 5.2 10*3/uL (ref 3.8–10.8)

## 2022-12-16 LAB — URINALYSIS, ROUTINE W REFLEX MICROSCOPIC
Bilirubin Urine: NEGATIVE
Glucose, UA: NEGATIVE
Hgb urine dipstick: NEGATIVE
Ketones, ur: NEGATIVE
Leukocytes,Ua: NEGATIVE
Nitrite: NEGATIVE
Protein, ur: NEGATIVE
Specific Gravity, Urine: 1.012 (ref 1.001–1.035)
pH: 6 (ref 5.0–8.0)

## 2022-12-16 LAB — LIPID PANEL
Cholesterol: 203 mg/dL — ABNORMAL HIGH (ref <200)
HDL: 92 mg/dL (ref >=40)
LDL Cholesterol (Calc): 96 mg/dL
Non-HDL Cholesterol (Calc): 111 mg/dL (ref <130)
Total CHOL/HDL Ratio: 2.2 (calc) (ref <5.0)
Triglycerides: 68 mg/dL (ref <150)

## 2022-12-16 LAB — MICROALBUMIN / CREATININE URINE RATIO
Creatinine, Urine: 60 mg/dL (ref 20–320)
Microalb Creat Ratio: 142 mg/g{creat} — ABNORMAL HIGH (ref <30)
Microalb, Ur: 8.5 mg/dL

## 2022-12-16 LAB — PSA: PSA: 4.94 ng/mL — ABNORMAL HIGH (ref <=4.00)

## 2022-12-16 LAB — TSH: TSH: 2.82 m[IU]/L (ref 0.40–4.50)

## 2022-12-16 LAB — MAGNESIUM: Magnesium: 2.1 mg/dL (ref 1.5–2.5)

## 2022-12-16 LAB — HEMOGLOBIN A1C
Hgb A1c MFr Bld: 5.4 %{Hb} (ref <5.7)
Mean Plasma Glucose: 108 mg/dL
eAG (mmol/L): 6 mmol/L

## 2022-12-16 LAB — INSULIN, RANDOM: Insulin: 11.3 u[IU]/mL

## 2022-12-16 LAB — PARATHYROID HORMONE, INTACT (NO CA): PTH: 54 pg/mL (ref 16–77)

## 2022-12-16 NOTE — Progress Notes (Signed)
<>*<>*<>*<>*<>*<>*<>*<>*<>*<>*<>*<>*<>*<>*<>*<>*<>*<>*<>*<>*<>*<>*<>*<>*<> <>*<>*<>*<>*<>*<>*<>*<>*<>*<>*<>*<>*<>*<>*<>*<>*<>*<>*<>*<>*<>*<>*<>*<>*<>  -  Test results slightly outside the reference range are not unusual. If there is anything important, I will review this with you,  otherwise it is considered normal test values.  If you have further questions,  please do not hesitate to contact me at the office or via My Chart.   <>*<>*<>*<>*<>*<>*<>*<>*<>*<>*<>*<>*<>*<>*<>*<>*<>*<>*<>*<>*<>*<>*<>*<>*<> <>*<>*<>*<>*<>*<>*<>*<>*<>*<>*<>*<>*<>*<>*<>*<>*<>*<>*<>*<>*<>*<>*<>*<>*<>  -  PSA  is lower - Great   <>*<>*<>*<>*<>*<>*<>*<>*<>*<>*<>*<>*<>*<>*<>*<>*<>*<>*<>*<>*<>*<>*<>*<>*<> <>*<>*<>*<>*<>*<>*<>*<>*<>*<>*<>*<>*<>*<>*<>*<>*<>*<>*<>*<>*<>*<>*<>*<>*<>  - Kidney functions   look a little dehydrated   -   Very important to drink adequate amounts of fluids to                                                                              prevent permanent damage     - Recommend drink at least 6 bottles (16 ounces) of                                                                      fluids /water /day = 96 Oz ~100 oz  - 100 oz = 3,000 cc or 3 liters / day  - >>                                                 That's 1 &1/2 bottles of a 2 liter soda bottle /day !   <>*<>*<>*<>*<>*<>*<>*<>*<>*<>*<>*<>*<>*<>*<>*<>*<>*<>*<>*<>*<>*<>*<>*<>*<> <>*<>*<>*<>*<>*<>*<>*<>*<>*<>*<>*<>*<>*<>*<>*<>*<>*<>*<>*<>*<>*<>*<>*<>*<>  -  Chol = 203 is OK since have such a high level of the "Good HDL Chol "   - So  Excellent   - Very low risk for Heart Attack  / Stroke  <>*<>*<>*<>*<>*<>*<>*<>*<>*<>*<>*<>*<>*<>*<>*<>*<>*<>*<>*<>*<>*<>*<>*<>*<> <>*<>*<>*<>*<>*<>*<>*<>*<>*<>*<>*<>*<>*<>*<>*<>*<>*<>*<>*<>*<>*<>*<>*<>*<>  -  A1c = 5.4%  Means    No Diabetes  - Great  !    <>*<>*<>*<>*<>*<>*<>*<>*<>*<>*<>*<>*<>*<>*<>*<>*<>*<>*<>*<>*<>*<>*<>*<>*<> <>*<>*<>*<>*<>*<>*<>*<>*<>*<>*<>*<>*<>*<>*<>*<>*<>*<>*<>*<>*<>*<>*<>*<>*<>  -  All Else - CBC - Electrolytes - Liver - Magnesium & Thyroid                                                                                                     - all  Normal / OK  <>*<>*<>*<>*<>*<>*<>*<>*<>*<>*<>*<>*<>*<>*<>*<>*<>*<>*<>*<>*<>*<>*<>*<>*<> <>*<>*<>*<>*<>*<>*<>*<>*<>*<>*<>*<>*<>*<>*<>*<>*<>*<>*<>*<>*<>*<>*<>*<>*<>

## 2022-12-19 ENCOUNTER — Encounter: Payer: Self-pay | Admitting: Internal Medicine

## 2023-01-11 ENCOUNTER — Institutional Professional Consult (permissible substitution) (INDEPENDENT_AMBULATORY_CARE_PROVIDER_SITE_OTHER): Payer: Medicare Other

## 2023-01-12 ENCOUNTER — Telehealth (INDEPENDENT_AMBULATORY_CARE_PROVIDER_SITE_OTHER): Payer: Self-pay | Admitting: Physician Assistant

## 2023-01-12 NOTE — Telephone Encounter (Signed)
Spoke with patient, confirmed location and appt

## 2023-01-13 ENCOUNTER — Institutional Professional Consult (permissible substitution) (INDEPENDENT_AMBULATORY_CARE_PROVIDER_SITE_OTHER): Payer: Medicare Other

## 2023-01-28 ENCOUNTER — Ambulatory Visit (INDEPENDENT_AMBULATORY_CARE_PROVIDER_SITE_OTHER): Payer: Medicare Other | Admitting: Physician Assistant

## 2023-01-28 ENCOUNTER — Encounter (INDEPENDENT_AMBULATORY_CARE_PROVIDER_SITE_OTHER): Payer: Self-pay | Admitting: Physician Assistant

## 2023-01-28 DIAGNOSIS — H6123 Impacted cerumen, bilateral: Secondary | ICD-10-CM

## 2023-01-28 NOTE — Patient Instructions (Signed)
 To help with your buildup of earwax try using sweet oil. This can be purchased over the counter at your local pharmacy. Using a sterilized ear dropper, place a few drops into your ear. Cover the ear with a cotton ball, or warm compress, for 5 to 10 minutes. Rub gently. Wipe out any excess ear wax, and the oil, with a cotton ball or a wet cloth.

## 2023-01-28 NOTE — Progress Notes (Signed)
Dear Dr. Oneta Rack, Here is my assessment for our mutual patient, Eugene Friedman. Thank you for allowing me the opportunity to care for your patient. Please do not hesitate to contact me should you have any other questions. Sincerely, Burna Forts PA-C  Otolaryngology Clinic Note Referring provider: Dr. Oneta Rack HPI:  Eugene Friedman is a 89 y.o. male kindly referred by Dr. Oneta Rack for evaluation of cerumen impaction.   The patient notes that he was referred from his audiologist, he was being seen for adjustment of his hearing aids, they noted abnormality in his right external auditory canal and sent him for further evaluation.  The patient notes that as a child he did have reoccurring ear infections predominantly on the left, he underwent left-sided mastoidectomy, he denies any tubes; no other head or neck surgery noted.  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  He notes ongoing hearing loss from a young age that has been somewhat symmetric but feels that the reoccurring ear infections as a kid has made the left side slightly worse.  He notes that he has had hearing aids for an extensive period of time as an adult he notes that they do help in some situations but finds some difficulty in restaurants or crowded areas.  He denies any history head or neck cancer, he denies any significant head or neck trauma.  He does not recall seeing any ENT specialists as an adult.  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   H&N Surgery: mastoidectomy left  Independent Review of Additional Tests or Records:  none   PMH/Meds/All/SocHx/FamHx/ROS:   Past Medical History:  Diagnosis Date   Allergic rhinitis    Atrial fibrillation by electrocardiogram (HCC) 10/2018   Was started on calcium channel blocker, beta-blocker and Xarelto   Cancer (HCC)    prostate   DJD  (degenerative joint disease)    Hypertension    Borderline    Sinus bradycardia    Spinal stenosis    Complicated by dural AVM   Vitamin D deficiency      Past Surgical History:  Procedure Laterality Date   APPENDECTOMY     Back Procedure  11/02/2018   [facility] -vascular neuroradiology occlusion of dural AVM   cervical neck mass   07/08/06   Left Excision left posterior cervical neck mass (2.5 cm). SURGEON:  Velora Heckler, M.D., FACS 07/08/06 and 12/05/07   FLEXIBLE SIGMOIDOSCOPY     HEMORRHOID SURGERY     JOINT REPLACEMENT     Left knee 2013   KNEE SURGERY     MASTOIDECTOMY Left     Family History  Problem Relation Age of Onset   Stroke Mother    Diabetes Mother    Heart attack Father 70   Hypertension Father    Diabetes Maternal Aunt    Diabetes Paternal Aunt    Cancer Maternal Grandmother      Social Connections: Not on file      Current Outpatient Medications:    amoxicillin (AMOXIL) 250 MG capsule, Take  1 capsule  3 x /day  with Meals for Gum or Dental  Infection, Disp: 42 capsule, Rfl: 0   Cholecalciferol (VITAMIN D-3) 5000 UNITS TABS, Take by mouth., Disp: , Rfl:    COVID-19 mRNA vaccine 2023-2024 (COMIRNATY) syringe, Inject into the muscle., Disp: 0.3 mL,  Rfl: 0   COVID-19 mRNA vaccine 2023-2024 (COMIRNATY) syringe, Inject 0.3 mLs into the muscle., Disp: 0.3 mL, Rfl: 0   cyclobenzaprine (FLEXERIL) 10 MG tablet, Take 1/2 to 1 tablet 3 x /day as needed for Muscle Spasm, Disp: 90 tablet, Rfl: 0   dicyclomine (BENTYL) 20 MG tablet, TAKE 1 TABLET BY MOUTH 3 TO 4 TIMES PER DAY BEFORE MEALS AND AT BEDTIME AS NEEDED FOR NAUSEA OR CRAMPING OR BLOATING OR DIARRHEA, Disp: 364 tablet, Rfl: 1   diltiazem (CARDIZEM CD) 120 MG 24 hr capsule, Take  1 capsule  Daily  with Food for BP & Heart Rhythm, Disp: 90 capsule, Rfl: 3   ezetimibe (ZETIA) 10 MG tablet, Take  1 tablet  Daily  for Cholesterol, Disp: 90 tablet, Rfl: 3   finasteride (PROSCAR) 5 MG tablet, Take  1 tablet  Daily for  Prostate, Disp: 90 tablet, Rfl: 3   flecainide (TAMBOCOR) 50 MG tablet, Take 1 & 1/2  tablet (75 mg)   2 x /day  (every 12 hours )        for Afib, Disp: 270 tablet, Rfl: 3   hyoscyamine (LEVSIN SL) 0.125 MG SL tablet, DISSOLVE 1 TO 2 TABLETS UNDER THE TONGUE FOUR TIMES DAILY AS NEEDED FOR ABDOMINAL CRAMPS OR NAUSEA OR DIARRHEA, Disp: 360 tablet, Rfl: 1   LUTEIN PO, Take 5 mg by mouth 2 (two) times daily., Disp: , Rfl:    Rivaroxaban (XARELTO) 15 MG TABS tablet, Take  1 tablet  Daily  for Atrial Fibrillation & to Prevent Blood Clots, Disp: 90 tablet, Rfl: 3   RSV vaccine recomb adjuvanted (AREXVY) 120 MCG/0.5ML injection, Inject into the muscle., Disp: 0.5 mL, Rfl: 0   tamsulosin (FLOMAX) 0.4 MG CAPS capsule, Take 1 tablet at Bedtime for Prostate, Disp: 90 capsule, Rfl: 3   Vibegron (GEMTESA) 75 MG TABS, Take  1 tablet  at Bedtime  for Bladder, Disp: 90 tablet, Rfl: 3   Physical Exam:   There were no vitals taken for this visit.  Pertinent Findings  CN II-XII intact  Bilateral EAC with impacted cerumen  Anterior rhinoscopy: Septum midline No lesions of oral cavity/oropharynx; dentition  No obviously palpable neck masses/lymphadenopathy/thyromegaly No respiratory distress or stridor  Seprately Identifiable Procedures:  Procedure: Bilateral ear microscopy and cerumen removal using microscope (CPT [phone]) - Mod 50 Pre-procedure diagnosis: Cerumen impaction bilateral external ears Post-procedure diagnosis: same Indication:  cerumen impaction; given patient's otologic complaints and history as well as for improved and comprehensive examination of external ear and tympanic membrane, bilateral otologic examination using microscope was performed and impacted cerumen removed  Procedure: Patient was placed semi-recumbent. Both ear canals were examined using the microscope with findings above. Cerumen removed on left and on right using suction and currette with improvement in EAC examination and  patency. Left: EAC was patent. TM was intact . Middle ear was aerated. Drainage: none Right: EAC was patent. TM was intact . Middle ear was aerated . Drainage: none Patient tolerated the procedure well.      Impression & Plans:  Eugene Friedman is a 89 y.o. male with cerumen impaction  Cerumen impaction-  The patient presented today with bilateral cerumen impaction.  I was able to clear ears bilaterally.  He denied any issues with this.  He was referred from audiology for abnormality in the right ear there was a picture attached to his paperwork which showed cerumen, after cleaning the ear I noted no significant abnormalities of the ear.  The  patient is okay to return to audiology for ongoing evaluation and management of hearing loss.  I am happy to see him back in the office with any further questions or concerns.  I would recommend he follow-up in 6 months for repeat cleaning.  The patient is happy with today's plan had no further questions or concerns.   - f/u 6 months    Thank you for allowing me the opportunity to care for your patient. Please do not hesitate to contact me should you have any other questions.  Sincerely, Burna Forts PA-C [facility] Phone: [phone] Fax: [phone]  01/28/2023, 9:46 AM

## 2023-02-25 NOTE — Progress Notes (Signed)
 Cc: doing ok  HPI: Eugene Friedman f/u for his spinal issues. He is walking about 0.8 miles, he walks with a cane, but walk actually quite well. He needs to stop at bathrooms freuquently, but less frequently if he eats salty food.  A/P spinal stenosis, AVF.  No intervention at this time.   Eugene Friedman will order PT, evaluate and treat, 1 session a month, 5 sessions.  40 min, > 50% counseling and care coordination. F/u prn any time.

## 2023-03-17 ENCOUNTER — Ambulatory Visit (INDEPENDENT_AMBULATORY_CARE_PROVIDER_SITE_OTHER): Payer: Medicare Other | Admitting: Nurse Practitioner

## 2023-03-17 VITALS — BP 130/80 | HR 80 | Temp 97.8°F | Resp 16 | Ht 73.0 in | Wt 200.4 lb

## 2023-03-17 DIAGNOSIS — R6889 Other general symptoms and signs: Secondary | ICD-10-CM

## 2023-03-17 DIAGNOSIS — C61 Malignant neoplasm of prostate: Secondary | ICD-10-CM | POA: Diagnosis not present

## 2023-03-17 DIAGNOSIS — R7309 Other abnormal glucose: Secondary | ICD-10-CM | POA: Diagnosis not present

## 2023-03-17 DIAGNOSIS — M4800 Spinal stenosis, site unspecified: Secondary | ICD-10-CM

## 2023-03-17 DIAGNOSIS — I4819 Other persistent atrial fibrillation: Secondary | ICD-10-CM | POA: Diagnosis not present

## 2023-03-17 DIAGNOSIS — R0989 Other specified symptoms and signs involving the circulatory and respiratory systems: Secondary | ICD-10-CM

## 2023-03-17 DIAGNOSIS — Q2730 Arteriovenous malformation, site unspecified: Secondary | ICD-10-CM

## 2023-03-17 DIAGNOSIS — Z0001 Encounter for general adult medical examination with abnormal findings: Secondary | ICD-10-CM

## 2023-03-17 DIAGNOSIS — Z0000 Encounter for general adult medical examination without abnormal findings: Secondary | ICD-10-CM

## 2023-03-17 DIAGNOSIS — Z79899 Other long term (current) drug therapy: Secondary | ICD-10-CM

## 2023-03-17 DIAGNOSIS — E782 Mixed hyperlipidemia: Secondary | ICD-10-CM

## 2023-03-17 DIAGNOSIS — E559 Vitamin D deficiency, unspecified: Secondary | ICD-10-CM

## 2023-03-17 NOTE — Progress Notes (Signed)
FOLLOW UP  Assessment:   Labile hypertension Discussed DASH (Dietary Approaches to Stop Hypertension) DASH diet is lower in sodium than a typical American diet. Cut back on foods that are high in saturated fat, cholesterol, and trans fats. Eat more whole-grain foods, fish, poultry, and nuts Remain active and exercise as tolerated daily.  Monitor BP at home-Call if greater than 130/80.  Check CMP/CBC   Mixed hyperlipidemia Discussed lifestyle modifications. Recommended diet heavy in fruits and veggies, omega 3's. Decrease consumption of animal meats, cheeses, and dairy products. Remain active and exercise as tolerated. Continue to monitor. Check lipids/TSH   Prostate cancer (HCC) Continue to monitor PSA  Persistent atrial fibrillation: CHA2DS2Vasc = 3 (Age & HTN) Continue medications and cardio follow up NSR today  History of elevated glucose Education: Reviewed 'ABCs' of diabetes management  Discussed goals to be met and/or maintained include A1C (<7) Blood pressure (<130/80) Cholesterol (LDL <70) Continue Eye Exam yearly  Continue Dental Exam Q6 mo Discussed dietary recommendations Discussed Physical Activity  Check A1C  Medication management All medications discussed and reviewed in full. All questions and concerns regarding medications addressed.    Vitamin D deficiency Continue supplement for goal of 60-100 Monitor Vitamin D levels  AVM (arteriovenous malformation) Monitor  Spinal stenosis, unspecified spinal region Continue PT  Atrial fibrillation, unspecified type (HCC) Continue Diltazem Follows with [facility]  Review of lab work stale - obtain NOV.  Notify office for further evaluation and treatment, questions or concerns if any reported s/s fail to improve.   The patient was advised to call back or seek an in-person evaluation if any symptoms worsen or if the condition fails to improve as anticipated.   Further disposition  pending results of labs. Discussed med's effects and SE's.    I discussed the assessment and treatment plan with the patient. The patient was provided an opportunity to ask questions and all were answered. The patient agreed with the plan and demonstrated an understanding of the instructions.  Discussed med's effects and SE's. Screening labs and tests as requested with regular follow-up as recommended.  I provided 35 minutes of face-to-face time during this encounter including counseling, chart review, and critical decision making was preformed.  Today's Plan of Care is based on a patient-centered health care approach known as shared decision making - the decisions, tests and treatments allow for patient preferences and values to be balanced with clinical evidence.     Future Appointments  Date Time Provider Department Center  05/24/2023 11:00 AM Adela Glimpse, NP [facility] None  12/23/2023 11:00 AM Lucky Cowboy, MD [facility] None     Plan:   During the course of the visit the patient was educated and counseled about appropriate screening and preventive services including:   Pneumococcal vaccine  Influenza vaccine Prevnar 13 Td vaccine Screening electrocardiogram Colorectal cancer screening Diabetes screening Glaucoma screening Nutrition counseling    Subjective:  Eugene Friedman is a 89 y.o. [place] male who presents for Medicare Annual Wellness Visit and 3 month follow up for HTN, hyperlipidemia, prediabetes, and vitamin D Def.   Overall he reports feeling well.    He continues to visit [place] in the summers.  Plans to leave 1st of June.    He is following with Dr. 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 Continues to do PT and will follow up for surveillance.  He has persistent Afib x sept 2020, on  flecainide, s/p failed cardioversion in 11/2018. He is on cardizem 120 mg daily.   Was seen at [facility] 08/13/21.  He was seen for persistent a fib and was in sinus rhythm followin cardioversion in April until yesterday 08/12/21 when he got an alert fro his smart watch for an episode of afib lasting several hours.  He naturally converted back.  He was instructed to continue flecainide 100 mg BID but can take and exgtra 50 mg once for an apisode of afib.  It was discussed that an alternate aniarrhythmic could be possible if he continues to have breakthrough--this was a video conference as he was in [place].  Denies any new or recent symptoms.  Follows with Dr. Liliane Shi for history of prostate cancer x 2007, active surveillance.    He has IBS.  Denies any recent blood in stool, black stool. No nausea, no weight loss.   BMI is Body mass index is 26.44 kg/m., he is working on diet and exercise. Wt Readings from Last 3 Encounters:  03/17/23 200 lb 6.4 oz (90.9 kg)  12/15/22 202 lb 3.2 oz (91.7 kg)  06/12/22 198 lb 9.6 oz (90.1 kg)   His blood pressure has been controlled at home at home are under 120/60's, today their BP is BP: 130/80  BP Readings from Last 3 Encounters:  03/17/23 130/80  12/15/22 138/80  06/12/22 (!) 158/82    He does workout. He denies chest pain, shortness of breath, dizziness.  He is on cholesterol medication and denies myalgias. His cholesterol is not at goal. The cholesterol last visit was:   Lab Results  Component Value Date   CHOL 203 (H) 12/15/2022   HDL 92 12/15/2022   LDLCALC 96 12/15/2022   TRIG 68 12/15/2022   CHOLHDL 2.2 12/15/2022    He has been working on diet and exercise for prediabetes, and denies paresthesia of the feet, polydipsia, polyuria and visual disturbances. Last A1C in the office was:  Lab Results  Component Value Date   HGBA1C 5.4 12/15/2022   Last GFR Lab Results  Component Value Date   GFRNONAA 38 (L) 01/30/2022   Patient is on Vitamin D supplement.   Lab Results  Component Value Date   VD25OH 77 06/12/2022      Medication  Review:   Current Outpatient Medications (Cardiovascular):    diltiazem (CARDIZEM CD) 120 MG 24 hr capsule, Take  1 capsule  Daily  with Food for BP & Heart Rhythm   ezetimibe (ZETIA) 10 MG tablet, Take  1 tablet  Daily  for Cholesterol   flecainide (TAMBOCOR) 50 MG tablet, Take 1 & 1/2  tablet (75 mg)   2 x /day  (every 12 hours )        for Afib    Current Outpatient Medications (Hematological):    Rivaroxaban (XARELTO) 15 MG TABS tablet, Take  1 tablet  Daily  for Atrial Fibrillation & to Prevent Blood Clots  Current Outpatient Medications (Other):    Cholecalciferol (VITAMIN D-3) 5000 UNITS TABS, Take by mouth.   COVID-19 mRNA vaccine 2023-2024 (COMIRNATY) syringe, Inject into the muscle.   COVID-19 mRNA vaccine 2023-2024 (COMIRNATY) syringe, Inject 0.3 mLs into the muscle.   cyclobenzaprine (FLEXERIL) 10 MG tablet, Take 1/2 to 1 tablet 3 x /day as needed for Muscle Spasm   dicyclomine (BENTYL) 20 MG tablet, TAKE 1 TABLET BY MOUTH 3 TO 4 TIMES PER DAY BEFORE MEALS AND AT BEDTIME AS NEEDED FOR NAUSEA OR CRAMPING  OR BLOATING OR DIARRHEA   finasteride (PROSCAR) 5 MG tablet, Take  1 tablet  Daily for Prostate   hyoscyamine (LEVSIN SL) 0.125 MG SL tablet, DISSOLVE 1 TO 2 TABLETS UNDER THE TONGUE FOUR TIMES DAILY AS NEEDED FOR ABDOMINAL CRAMPS OR NAUSEA OR DIARRHEA   LUTEIN PO, Take 5 mg by mouth 2 (two) times daily.   RSV vaccine recomb adjuvanted (AREXVY) 120 MCG/0.5ML injection, Inject into the muscle.   tamsulosin (FLOMAX) 0.4 MG CAPS capsule, Take 1 tablet at Bedtime for Prostate   Vibegron (GEMTESA) 75 MG TABS, Take  1 tablet  at Bedtime  for Bladder   amoxicillin (AMOXIL) 250 MG capsule, Take  1 capsule  3 x /day  with Meals for Gum or Dental  Infection  Allergies: Allergies  Allergen Reactions   Celebrex [Celecoxib] Other (See Comments)    GI upset   Lipitor [Atorvastatin]    Ppd [Tuberculin Purified Protein Derivative] Other (See Comments)    Positive PPD   Adhesive [Tape]  Rash    Zinc in the band-aid will cause a rash   Nickel Rash    Current Problems (verified) has Hyperlipidemia; Sinus bradycardia; Vitamin D deficiency; History of elevated glucose; Elevated prostate specific antigen (PSA); Labile hypertension; Medication management; Prostate cancer (HCC); Spinal stenosis; Persistent atrial fibrillation: CHA2DS2Vasc = 3 (Age & HTN); AVM (arteriovenous malformation); and Stage 3b chronic kidney disease (HCC) on their problem list.  Screening Tests Immunization History  Administered Date(s) Administered   H1N1 02/17/2007   Influenza Whole 12/05/2012   Influenza, High Dose Seasonal PF 11/14/2013, 11/20/2014, 12/16/2015, 10/30/2016, 02/04/2018, 11/22/2018, 10/18/2019, 11/23/2019, 12/09/2020, 11/20/2021, 12/15/2022   Influenza-Unspecified 12/05/2012   PFIZER(Purple Top)SARS-COV-2 Vaccination 03/09/2019, 03/30/2019, 11/07/2019   Pfizer(Comirnaty)Fall Seasonal Vaccine 12 years and older 12/31/2021, 06/03/2022, 10/28/2022   Pneumococcal Conjugate-13 12/05/2014   Pneumococcal Polysaccharide-23 03/08/2008, 05/18/2013   Respiratory Syncytial Virus Vaccine,Recomb Aduvanted(Arexvy) 01/29/2022   Td 06/14/2019   Tdap 02/17/2008, 02/11/2021   Zoster, Live 02/16/2005   Preventative care: Last colonoscopy: more than 10 years ago, will not get another due to age  Prior vaccinations: TD or Tdap: 2010 DUE Influenza: 11/2021 COVID vaccines completed 03/30/2019 05/2022 Pneumococcal: 2015 Prevnar13: 2016 Shingles/Zostavax: 2007  Names of Other Physician/Practitioners you currently use: 1. [facility] here for primary care 2. [facility], eye doctor, last visit 10/2022 3. Rande Lawman, dentist, last visit 2024 Patient Care Team: Lucky Cowboy, MD as PCP - General (Internal Medicine) Nelson Chimes, MD as Consulting Physician (Ophthalmology) Nahser, Deloris Ping, MD as Consulting Physician (Cardiology) Darnell Level, MD as Consulting Physician  (General Surgery) Valeria Batman, MD (Inactive) as Consulting Physician (Orthopedic Surgery)  Surgical: He  has a past surgical history that includes cervical neck mass  (07/08/06); Appendectomy; Mastoidectomy (Left); Flexible sigmoidoscopy; Knee surgery; Hemorrhoid surgery; Joint replacement; and Back Procedure (11/02/2018). Family His family history includes Cancer in his maternal grandmother; Diabetes in his maternal aunt, mother, and paternal aunt; Heart attack (age of onset: 77) in his father; Hypertension in his father; Stroke in his mother. Social history  He reports that he has never smoked. He has never used smokeless tobacco. He reports current alcohol use of about 10.0 standard drinks of alcohol per week. He reports that he does not use drugs.  MEDICARE WELLNESS OBJECTIVES: Physical activity:   Cardiac risk factors:   Depression/mood screen:      12/15/2022   12:25 AM  Depression screen PHQ 2/9  Decreased Interest 0  Down, Depressed, Hopeless 0  PHQ -  2 Score 0    ADLs:     12/15/2022   12:26 AM  In your present state of health, do you have any difficulty performing the following activities:  Hearing? 0  Vision? 0  Difficulty concentrating or making decisions? 0  Walking or climbing stairs? 0  Dressing or bathing? 0  Doing errands, shopping? 0     Cognitive Testing  Alert? Yes  Normal Appearance?Yes  Oriented to person? Yes  Place? Yes   Time? Yes  Recall of three objects?  Yes  Can perform simple calculations? Yes  Displays appropriate judgment?Yes  Can read the correct time from a watch face?Yes  EOL planning:     Objective:   Today's Vitals   03/17/23 1147  BP: 130/80  Pulse: 80  Resp: 16  Temp: 97.8 F (36.6 C)  SpO2: 96%  Weight: 200 lb 6.4 oz (90.9 kg)  Height: 6\' 1"  (1.854 m)   Body mass index is 26.44 kg/m.  General appearance: alert, no distress, WD/WN, male HEENT: normocephalic, sclerae anicteric, TMs pearly, nares patent, no  discharge or erythema, pharynx normal Oral cavity: MMM, no lesions Neck: supple, no lymphadenopathy, no thyromegaly, no masses Heart: RRR, normal S1, S2, no murmurs Lungs: CTA bilaterally, no wheezes, rhonchi, or rales Abdomen: +bs, soft, non tender, non distended, no masses, no hepatomegaly, no splenomegaly Musculoskeletal: nontender, no swelling, no obvious deformity Extremities: no edema, no cyanosis, no clubbing Pulses: 2+ symmetric, upper and lower extremities, normal cap refill Neurological: alert, oriented x 3, CN2-12 intact, strength normal upper extremities and lower extremities, sensation normal throughout, DTRs 2+ throughout, no cerebellar signs, gait normal Psychiatric: normal affect, behavior normal, pleasant   Medicare Attestation I have personally reviewed: The patient's medical and social history Their use of alcohol, tobacco or illicit drugs Their current medications and supplements The patient's functional ability including ADLs,fall risks, home safety risks, cognitive, and hearing and visual impairment Diet and physical activities Evidence for depression or mood disorders  The patient's weight, height, BMI, and visual acuity have been recorded in the chart.  I have made referrals, counseling, and provided education to the patient based on review of the above and I have provided the patient with a written personalized care plan for preventive services.     Adela Glimpse, NP   03/21/2023

## 2023-03-21 ENCOUNTER — Encounter: Payer: Self-pay | Admitting: Nurse Practitioner

## 2023-03-21 NOTE — Patient Instructions (Signed)

## 2023-04-13 NOTE — Progress Notes (Deleted)
   Office Visit Note   Patient: Eugene Friedman           Date of Birth: 1928/10/22           MRN: [id number] Visit Date: 04/15/2023              Requested by:  Lucky Cowboy, MD [address] [place],  [place] [address]  PCP: Lucky Cowboy, MD   Assessment & Plan: Visit Diagnoses: No diagnosis found.  Plan: ***  Follow-Up Instructions: No follow-ups on file.   Orders:  No orders of the defined types were placed in this encounter. No orders of the defined types were placed in this encounter.    Procedures: No procedures performed   Clinical Data: No additional findings.   Subjective: No chief complaint on file.  HPI  Review of Systems   Objective: Vital Signs: There were no vitals taken for this visit.  Physical Exam  Ortho Exam  Specialty Comments:  No specialty comments available.  Imaging: No results found.   PMFS History: Patient Active Problem List   Diagnosis Date Noted  . Stage 3b chronic kidney disease (HCC) 02/27/2020  . Persistent atrial fibrillation: CHA2DS2Vasc = 3 (Age & HTN) 11/07/2018  . AVM (arteriovenous malformation) 10/31/2018  . Spinal stenosis 02/04/2018  . Prostate cancer (HCC) 12/16/2015  . Medication management 11/14/2013  . Vitamin D deficiency 02/12/2013  . History of elevated glucose 02/12/2013  . Elevated prostate specific antigen (PSA) 02/12/2013  . Labile hypertension 02/12/2013  . Hyperlipidemia   . Sinus bradycardia    Past Medical History:  Diagnosis Date  . Allergic rhinitis   . Atrial fibrillation by electrocardiogram (HCC) 10/2018   Was started on calcium channel blocker, beta-blocker and Xarelto  . Cancer [facility])    prostate  . DJD (degenerative joint disease)   . Hypertension    Borderline   . Sinus bradycardia   . Spinal stenosis    Complicated by dural AVM  . Vitamin D deficiency     Family History  Problem Relation Age of Onset  . Stroke Mother   . Diabetes Mother   .  Heart attack Father 84  . Hypertension Father   . Diabetes Maternal Aunt   . Diabetes Paternal Aunt   . Cancer Maternal Grandmother     Past Surgical History:  Procedure Laterality Date  . APPENDECTOMY    . Back Procedure  11/02/2018   [facility] -vascular neuroradiology occlusion of dural AVM  . cervical neck mass   07/08/06   Left Excision left posterior cervical neck mass (2.5 cm). SURGEON:  Velora Heckler, M.D., Carilyn Goodpasture 07/08/06 and 12/05/07  . FLEXIBLE SIGMOIDOSCOPY    . HEMORRHOID SURGERY    . JOINT REPLACEMENT     Left knee 2013  . KNEE SURGERY    . MASTOIDECTOMY Left    Social History   Occupational History  . Occupation: RETIRED    Employer: [employer]    Comment: PRESIDENT  Tobacco Use  . Smoking status: Never  . Smokeless tobacco: Never  Vaping Use  . Vaping status: Never Used  Substance and Sexual Activity  . Alcohol use: Yes    Alcohol/week: 10.0 standard drinks of alcohol    Types: 10 Glasses of wine per week  . Drug use: No  . Sexual activity: Not on file

## 2023-04-15 ENCOUNTER — Ambulatory Visit: Payer: Medicare Other | Admitting: Orthopaedic Surgery

## 2023-04-15 ENCOUNTER — Ambulatory Visit (HOSPITAL_COMMUNITY)
Admission: EM | Admit: 2023-04-15 | Discharge: 2023-04-15 | Disposition: A | Discharge status: Home / Self Care | Payer: Medicare Other | Attending: Physician Assistant | Admitting: Physician Assistant

## 2023-04-15 ENCOUNTER — Encounter (HOSPITAL_COMMUNITY): Payer: Self-pay

## 2023-04-15 DIAGNOSIS — Z8679 Personal history of other diseases of the circulatory system: Secondary | ICD-10-CM | POA: Diagnosis not present

## 2023-04-15 NOTE — Discharge Instructions (Signed)
 I am glad that he is feeling well now.  Contact his cardiologist to determine next steps.  If he has recurrent shortness of breath, lightheadedness, dizziness, fatigue, malaise please return for reevaluation as we discussed.  If he has any significant symptoms including passing out, instability, shortness of breath, chest pain, heart racing he should go to the ER.

## 2023-04-15 NOTE — ED Provider Notes (Signed)
 [facility]    CSN: [id number] Arrival date & time: 04/15/23  1057      History   Chief Complaint Chief Complaint  Patient presents with   Shortness of Breath   Nausea   Fatigue   Dizziness    HPI Eugene Friedman is a 89 y.o. male.   Patient presents today companied by his daughter who help provide the majority of history.  Reports that for the past several days he was feeling very weak, short of breath, with heart racing.  He has a history of recurrent atrial fibrillation and was thought to be in A-fib based on his Apple Watch.  He is followed by cardiology at [facility] and they requested that he have basic blood work including CBC, BMP and twelve-lead EKG obtained and they would schedule cardioversion for tomorrow (04/16/2023).  She is requesting that the twelve-lead EKG to be done by our clinic as his primary care is no longer in practice and they do not have someone else who can obtain this.  He has already had blood work obtained at [facility].  He is currently feeling well and denies any chest pain, shortness of breath, lightheadedness, dizziness, palpitations, weakness.    Past Medical History:  Diagnosis Date   Allergic rhinitis    Atrial fibrillation by electrocardiogram (HCC) 10/2018   Was started on calcium channel blocker, beta-blocker and Xarelto   Cancer (HCC)    prostate   DJD (degenerative joint disease)    Hypertension    Borderline    Sinus bradycardia    Spinal stenosis    Complicated by dural AVM   Vitamin D deficiency     Patient Active Problem List   Diagnosis Date Noted   Stage 3b chronic kidney disease (HCC) 02/27/2020   Persistent atrial fibrillation: CHA2DS2Vasc = 3 (Age & HTN) 11/07/2018   AVM (arteriovenous malformation) 10/31/2018   Spinal stenosis 02/04/2018   Prostate cancer (HCC) 12/16/2015   Medication management 11/14/2013   Vitamin D deficiency 02/12/2013   History of elevated glucose 02/12/2013   Elevated prostate specific  antigen (PSA) 02/12/2013   Labile hypertension 02/12/2013   Hyperlipidemia    Sinus bradycardia     Past Surgical History:  Procedure Laterality Date   APPENDECTOMY     Back Procedure  11/02/2018   [facility] -vascular neuroradiology occlusion of dural AVM   cervical neck mass   07/08/06   Left Excision left posterior cervical neck mass (2.5 cm). SURGEON:  Velora Heckler, M.D., FACS 07/08/06 and 12/05/07   FLEXIBLE SIGMOIDOSCOPY     HEMORRHOID SURGERY     JOINT REPLACEMENT     Left knee 2013   KNEE SURGERY     MASTOIDECTOMY Left        Home Medications    Prior to Admission medications   Medication Sig Start Date End Date Taking? Authorizing Provider  Cholecalciferol (VITAMIN D-3) 5000 UNITS TABS Take by mouth.    [provider]  COVID-19 mRNA vaccine [phone] (COMIRNATY) syringe Inject into the muscle. 12/31/21   Judyann Munson, MD  COVID-19 mRNA vaccine [phone] (COMIRNATY) syringe Inject 0.3 mLs into the muscle. 06/03/22     cyclobenzaprine (FLEXERIL) 10 MG tablet Take 1/2 to 1 tablet 3 x /day as needed for Muscle Spasm 11/20/21   Lucky Cowboy, MD  dicyclomine (BENTYL) 20 MG tablet TAKE 1 TABLET BY MOUTH 3 TO 4 TIMES PER DAY BEFORE MEALS AND AT BEDTIME AS NEEDED FOR NAUSEA OR CRAMPING OR BLOATING  OR DIARRHEA 10/26/22   Raynelle Dick, NP  diltiazem (CARDIZEM CD) 120 MG 24 hr capsule Take  1 capsule  Daily  with Food for BP & Heart Rhythm 06/24/22   Lucky Cowboy, MD  finasteride (PROSCAR) 5 MG tablet Take  1 tablet  Daily for Prostate 06/24/22   Lucky Cowboy, MD  flecainide (TAMBOCOR) 50 MG tablet Take 1 & 1/2  tablet (75 mg)   2 x /day  (every 12 hours )        for Afib 06/24/22   Lucky Cowboy, MD  hyoscyamine (LEVSIN SL) 0.125 MG SL tablet DISSOLVE 1 TO 2 TABLETS UNDER THE TONGUE FOUR TIMES DAILY AS NEEDED FOR ABDOMINAL CRAMPS OR NAUSEA OR DIARRHEA 06/24/22   Lucky Cowboy, MD  LUTEIN PO Take 5 mg by mouth 2 (two) times daily.    [provider]   Rivaroxaban (XARELTO) 15 MG TABS tablet Take  1 tablet  Daily  for Atrial Fibrillation & to Prevent Blood Clots 06/24/22   Lucky Cowboy, MD  RSV vaccine recomb adjuvanted (AREXVY) 120 MCG/0.5ML injection Inject into the muscle. 01/29/22   Judyann Munson, MD    Family History Family History  Problem Relation Age of Onset   Stroke Mother    Diabetes Mother    Heart attack Father 72   Hypertension Father    Diabetes Maternal Aunt    Diabetes Paternal Aunt    Cancer Maternal Grandmother     Social History Social History   Tobacco Use   Smoking status: Never   Smokeless tobacco: Never  Vaping Use   Vaping status: Never Used  Substance Use Topics   Alcohol use: Yes    Alcohol/week: 10.0 standard drinks of alcohol    Types: 10 Glasses of wine per week   Drug use: No     Allergies   Celebrex [celecoxib], Lipitor [atorvastatin], Ppd [tuberculin purified protein derivative], Adhesive [tape], and Nickel   Review of Systems Review of Systems  Constitutional:  Negative for activity change, appetite change, fatigue and fever.  Respiratory:  Negative for shortness of breath.   Cardiovascular:  Negative for chest pain, palpitations and leg swelling.  Gastrointestinal:  Negative for abdominal pain, diarrhea, nausea and vomiting.  Neurological:  Negative for dizziness, light-headedness and headaches.     Physical Exam Triage Vital Signs ED Triage Vitals [04/15/23 1139]  Encounter Vitals Group     BP (!) 140/66     Systolic BP Percentile      Diastolic BP Percentile      Pulse Rate 67     Resp 16     Temp (!) 97.5 F (36.4 C)     Temp Source Oral     SpO2 100 %     Weight      Height      Head Circumference      Peak Flow      Pain Score 0     Pain Loc      Pain Education      Exclude from Growth Chart    No data found.  Updated Vital Signs BP (!) 140/66 (BP Location: Right Arm)   Pulse 67   Temp (!) 97.5 F (36.4 C) (Oral)   Resp 16   SpO2 100%    Visual Acuity Right Eye Distance:   Left Eye Distance:   Bilateral Distance:    Right Eye Near:   Left Eye Near:    Bilateral Near:  Physical Exam Vitals reviewed.  Constitutional:      General: He is awake.     Appearance: Normal appearance. He is well-developed. He is not ill-appearing.     Comments: Very pleasant male appears stated age in no acute distress sitting comfortably in exam room  HENT:     Head: Normocephalic and atraumatic.     Mouth/Throat:     Pharynx: Uvula midline. No oropharyngeal exudate or posterior oropharyngeal erythema.  Cardiovascular:     Rate and Rhythm: Normal rate and regular rhythm.     Heart sounds: Normal heart sounds, S1 normal and S2 normal. No murmur heard. Pulmonary:     Effort: Pulmonary effort is normal.     Breath sounds: Normal breath sounds. No stridor. No wheezing, rhonchi or rales.     Comments: Clear to auscultation bilaterally Neurological:     Mental Status: He is alert.  Psychiatric:        Behavior: Behavior is cooperative.      UC Treatments / Results  Labs (all labs ordered are listed, but only abnormal results are displayed) Labs Reviewed - No data to display  EKG   Radiology No results found.  Procedures Procedures (including critical care time)  Medications Ordered in UC Medications - No data to display  Initial Impression / Assessment and Plan / UC Course  I have reviewed the triage vital signs and the nursing notes.  Pertinent labs & imaging results that were available during my care of the patient were reviewed by me and considered in my medical decision making (see chart for details).     Patient is well-appearing, afebrile, nontoxic, nontachycardic.  He is currently asymptomatic.  EKG was obtained that showed normal sinus rhythm with ventricular rate of 66 bpm without ischemic changes; compared to 12/15/2022 tracing no significant change.  We discussed that he is not currently in A-fib and he  believes this is why he is feeling better.  Recommend that he follow-up with his cardiology tomorrow as scheduled.  We discussed that if he has any recurrent symptoms including chest pain, shortness of breath, palpitations, weakness, lightheadedness he needs to go to the ER.  All questions were answered to his and daughter satisfaction.  Final Clinical Impressions(s) / UC Diagnoses   Final diagnoses:  History of atrial fibrillation     Discharge Instructions      I am glad that he is feeling well now.  Contact his cardiologist to determine next steps.  If he has recurrent shortness of breath, lightheadedness, dizziness, fatigue, malaise please return for reevaluation as we discussed.  If he has any significant symptoms including passing out, instability, shortness of breath, chest pain, heart racing he should go to the ER.    ED Prescriptions   None    PDMP not reviewed this encounter.   Jeani Hawking, PA-C 04/15/23 1229

## 2023-04-15 NOTE — ED Triage Notes (Signed)
 Patient has a long history of atrial fib and is scheduled for a cardioversion at [facility] tomorrow. Patient's daughter reports that the patient needs an EKG to take with him to [facility] tomorrow for the cardioversion. Daughter also reports that the patient has SOB, nausea, dizziness , and fatigue.

## 2023-04-16 ENCOUNTER — Ambulatory Visit (HOSPITAL_COMMUNITY): Payer: Self-pay

## 2023-05-06 ENCOUNTER — Telehealth: Payer: Self-pay

## 2023-05-06 NOTE — Telephone Encounter (Signed)
 Copied from CRM [id number]. Topic: Clinical - Request for Lab/Test Order >> May 05, 2023  4:33 PM Florestine Avers wrote: Reason for CRM: Patients daughter Janice Coffin called on patients behalf. She is requesting that the patient get his labs done before his appointment which is a complete work up. He just had a Afib episode per his daughter but it has corrected itself. There is currently no pending lab order in the chart. Patients daughter also states that she was speaking with "Tiffany" and she would like a call back and be reached at [phone]. >> May 05, 2023  4:46 PM CMA Sarah A wrote: Wrong office   Please advise

## 2023-05-11 ENCOUNTER — Other Ambulatory Visit: Payer: Self-pay

## 2023-05-11 DIAGNOSIS — E782 Mixed hyperlipidemia: Secondary | ICD-10-CM

## 2023-05-11 DIAGNOSIS — C61 Malignant neoplasm of prostate: Secondary | ICD-10-CM

## 2023-05-11 DIAGNOSIS — R972 Elevated prostate specific antigen [PSA]: Secondary | ICD-10-CM

## 2023-05-11 DIAGNOSIS — N1832 Chronic kidney disease, stage 3b: Secondary | ICD-10-CM

## 2023-05-11 DIAGNOSIS — E559 Vitamin D deficiency, unspecified: Secondary | ICD-10-CM

## 2023-05-11 DIAGNOSIS — Z0000 Encounter for general adult medical examination without abnormal findings: Secondary | ICD-10-CM

## 2023-05-24 ENCOUNTER — Ambulatory Visit: Payer: Medicare Other | Admitting: Nurse Practitioner

## 2023-05-25 ENCOUNTER — Other Ambulatory Visit

## 2023-05-25 NOTE — Telephone Encounter (Signed)
 Called patient's daughter and LVM informing her that we are not able to complete lab work for patient prior to new patient appointment. I also clarified that the once they come in for the NP appointment, then provider will speak with them and order labs they see necessary prior to having labs drawn. I informed her in the message to reach out to [place] @ [phone] if she has any additional questions.

## 2023-05-25 NOTE — Telephone Encounter (Signed)
 Pt's daughter stopped by the office and stated they had not received this message . Informed pt's daughter that  we are unable to draw labs before new pt appt . Patient's daughter was adamant about coming in before the  05/31/2022 due to [place] going [place] country on [place] .

## 2023-05-26 ENCOUNTER — Other Ambulatory Visit (HOSPITAL_BASED_OUTPATIENT_CLINIC_OR_DEPARTMENT_OTHER): Payer: Self-pay

## 2023-05-26 MED ORDER — COMIRNATY 30 MCG/0.3ML IM SUSY
0.3000 mL | PREFILLED_SYRINGE | Freq: Once | INTRAMUSCULAR | 0 refills | Status: AC
  Filled 2023-05-26: qty 0.3, 1d supply, fill #0

## 2023-05-31 ENCOUNTER — Encounter: Payer: Self-pay | Admitting: Internal Medicine

## 2023-05-31 ENCOUNTER — Ambulatory Visit: Admitting: Internal Medicine

## 2023-05-31 VITALS — BP 130/64 | HR 59 | Temp 97.5°F | Ht 73.0 in | Wt 195.6 lb

## 2023-05-31 DIAGNOSIS — N182 Chronic kidney disease, stage 2 (mild): Secondary | ICD-10-CM

## 2023-05-31 DIAGNOSIS — R682 Dry mouth, unspecified: Secondary | ICD-10-CM | POA: Insufficient documentation

## 2023-05-31 DIAGNOSIS — R0989 Other specified symptoms and signs involving the circulatory and respiratory systems: Secondary | ICD-10-CM | POA: Diagnosis not present

## 2023-05-31 DIAGNOSIS — I4819 Other persistent atrial fibrillation: Secondary | ICD-10-CM

## 2023-05-31 DIAGNOSIS — E559 Vitamin D deficiency, unspecified: Secondary | ICD-10-CM

## 2023-05-31 DIAGNOSIS — K580 Irritable bowel syndrome with diarrhea: Secondary | ICD-10-CM | POA: Insufficient documentation

## 2023-05-31 DIAGNOSIS — R972 Elevated prostate specific antigen [PSA]: Secondary | ICD-10-CM

## 2023-05-31 DIAGNOSIS — Q2730 Arteriovenous malformation, site unspecified: Secondary | ICD-10-CM

## 2023-05-31 DIAGNOSIS — Z0000 Encounter for general adult medical examination without abnormal findings: Secondary | ICD-10-CM | POA: Insufficient documentation

## 2023-05-31 DIAGNOSIS — E782 Mixed hyperlipidemia: Secondary | ICD-10-CM

## 2023-05-31 DIAGNOSIS — N138 Other obstructive and reflux uropathy: Secondary | ICD-10-CM

## 2023-05-31 DIAGNOSIS — I4811 Longstanding persistent atrial fibrillation: Secondary | ICD-10-CM

## 2023-05-31 DIAGNOSIS — K582 Mixed irritable bowel syndrome: Secondary | ICD-10-CM

## 2023-05-31 DIAGNOSIS — Z8639 Personal history of other endocrine, nutritional and metabolic disease: Secondary | ICD-10-CM | POA: Diagnosis not present

## 2023-05-31 DIAGNOSIS — R001 Bradycardia, unspecified: Secondary | ICD-10-CM

## 2023-05-31 DIAGNOSIS — R54 Age-related physical debility: Secondary | ICD-10-CM

## 2023-05-31 DIAGNOSIS — N401 Enlarged prostate with lower urinary tract symptoms: Secondary | ICD-10-CM | POA: Insufficient documentation

## 2023-05-31 DIAGNOSIS — M48062 Spinal stenosis, lumbar region with neurogenic claudication: Secondary | ICD-10-CM

## 2023-05-31 DIAGNOSIS — Z9989 Dependence on other enabling machines and devices: Secondary | ICD-10-CM | POA: Insufficient documentation

## 2023-05-31 LAB — COMPREHENSIVE METABOLIC PANEL WITH GFR
ALT: 21 U/L (ref 0–53)
AST: 19 U/L (ref 0–37)
Albumin: 4.5 g/dL (ref 3.5–5.2)
Alkaline Phosphatase: 94 U/L (ref 39–117)
BUN: 30 mg/dL — ABNORMAL HIGH (ref 6–23)
CO2: 26 meq/L (ref 19–32)
Calcium: 9.3 mg/dL (ref 8.4–10.5)
Chloride: 103 meq/L (ref 96–112)
Creatinine, Ser: 1.45 mg/dL (ref 0.40–1.50)
GFR: 41.75 mL/min — ABNORMAL LOW (ref >60.00)
Glucose, Bld: 90 mg/dL (ref 70–99)
Potassium: 4.8 meq/L (ref 3.5–5.1)
Sodium: 137 meq/L (ref 135–145)
Total Bilirubin: 0.5 mg/dL (ref 0.2–1.2)
Total Protein: 6.6 g/dL (ref 6.0–8.3)

## 2023-05-31 LAB — VITAMIN D 25 HYDROXY (VIT D DEFICIENCY, FRACTURES): VITD: 79.84 ng/mL (ref 30.00–100.00)

## 2023-05-31 LAB — PSA: PSA: 8.48 ng/mL — ABNORMAL HIGH (ref 0.10–4.00)

## 2023-05-31 LAB — HEMOGLOBIN A1C: Hgb A1c MFr Bld: 5.4 % (ref 4.6–6.5)

## 2023-05-31 MED ORDER — POLYETHYLENE GLYCOL 3350 17 G PO PACK: 17.0000 g | PACK | Freq: Every day | ORAL | 3 refills | Status: AC

## 2023-05-31 MED ORDER — FLECAINIDE ACETATE 50 MG PO TABS: ORAL_TABLET | ORAL | 3 refills | Status: AC

## 2023-05-31 MED ORDER — EZETIMIBE 10 MG PO TABS: 10.0000 mg | ORAL_TABLET | Freq: Every day | ORAL | 3 refills | Status: AC

## 2023-05-31 MED ORDER — RIVAROXABAN 15 MG PO TABS: ORAL_TABLET | ORAL | 3 refills | Status: AC

## 2023-05-31 MED ORDER — VITAMIN D-3 125 MCG (5000 UT) PO TABS: 1.0000 | ORAL_TABLET | Freq: Every day | ORAL | 3 refills | Status: AC

## 2023-05-31 MED ORDER — DILTIAZEM HCL ER COATED BEADS 120 MG PO CP24: ORAL_CAPSULE | ORAL | 3 refills | Status: AC

## 2023-05-31 MED ORDER — DICYCLOMINE HCL 20 MG PO TABS: ORAL_TABLET | ORAL | 1 refills | Status: AC

## 2023-05-31 MED ORDER — FINASTERIDE 5 MG PO TABS: ORAL_TABLET | ORAL | 3 refills | Status: DC

## 2023-05-31 NOTE — Assessment & Plan Note (Signed)
 Assessment: History of arteriovenous malformation surgically treated at [facility] in 2020. No current symptoms or complications. Plan: No active management required Monitor for any new neurological symptoms

## 2023-05-31 NOTE — Assessment & Plan Note (Signed)
 Assessment: GFR 41.75 mL/min consistent with moderate CKD stage 3a. BUN elevated at 30 mg/dL. Creatinine 1.45 mg/dL, at upper limit of normal range. Likely age-related decline without other obvious etiology. No proteinuria previously detected. Plan: Monitor renal function with comprehensive metabolic panel every 6 months Avoid nephrotoxic medications Maintain adequate hydration Blood pressure control as noted above Consider nephrology referral if GFR declines below 30 mL/min

## 2023-05-31 NOTE — Assessment & Plan Note (Signed)
 Assessment: Currently managed with ezetimibe 10 mg daily. Recent lipid panel shows total cholesterol 203 mg/dL (borderline elevated), HDL 92 mg/dL (excellent), LDL 96 mg/dL (at goal), and triglycerides 68 mg/dL (optimal). Patient follows a heart-healthy diet rich in fish and vegetables. Plan: Continue ezetimibe 10 mg daily Maintain current dietary approach Recheck lipid panel in 6 months ASCVD risk assessment discussed; formal calculation not applicable due to age >3

## 2023-05-31 NOTE — Assessment & Plan Note (Signed)
 Assessment: 15+ year history of nocturia occurring hourly. Currently managed with finasteride 5 mg daily. Associated with elevated PSA levels, most recent 8.48 ng/mL (increased from previous readings). Remote history of possible prostate cancer diagnosis 25 years ago, though patient declined prostatectomy at that time. Plan: Continue finasteride 5 mg daily Discussed procedural options including minimally invasive approaches (nuclear and microwave treatments) as alternatives to surgery Recommended urology follow-up to discuss rising PSA and treatment options Continue monitoring PSA levels; next check in 6 months Consider medical management with alpha-blocker if symptoms worsen

## 2023-05-31 NOTE — Patient Instructions (Addendum)
 Encourage you to discuss ablation with Dr. Sandy Salaam and prostate procedures with Dr. Sande Brothers. Welcome aboard!   Today's visit was a valuable first step in understanding your health and starting your personalized care journey. We discussed your medical history and medications in detail. Given the extensive information, we prioritized addressing your most pressing concerns.  We understood those concerns to be:  new Eugene Friedman (Eugene Friedman is present to est care with pcp and he is fasting for labs today. He is leaving out of town soon.)   AFTER VISIT SUMMARY - 05/31/2023   Thank you for your visit today. This summary includes important information about your health and next steps for your care.  Your Vital Signs Today: Measurement Your Value Target Range  Blood Pressure 130/64 Within target range  Heart Rate 59 Acceptable (medication effect)  Weight 195 lb 9.6 oz Down 5 pounds since January  BMI 25.81 Near ideal range  Oxygen Level 98% Excellent  Key Findings & Discussions:     Heart Rhythm (Atrial Fibrillation): Your condition remains stable with your current medications. We discussed the possibility of catheter ablation as a potentially definitive treatment that could reduce your need for medications and their side effects. Modern ablation techniques have improved significantly and may be worth considering.       Spinal Stenosis: Your back condition continues to impact your mobility. Physical therapy is providing some benefit, and we've ordered continued therapy. We discussed that surgery remains an option if your symptoms worsen significantly, though there are risks to consider.       Prostate: Your PSA level has increased to 8.48. While you've chosen conservative management in the past, it would be reasonable to consult with a urologist given this change. There are now several minimally invasive treatment options that could help with your urinary symptoms.       Kidney Function: Your kidney function shows some  mild decline, which is common with aging. We'll continue to monitor this with your regular blood tests.   YOUR MEDICATION PLAN   Medication Instructions Purpose  Diltiazem (Cardizem CD) 120mg  Take 1 capsule daily with food Blood pressure & heart rhythm  Flecainide (Tambocor) 50mg  Take 1 & 1/2 tablets (75mg ) twice daily (every 12 hours) Heart rhythm (atrial fibrillation)  Rivaroxaban (Xarelto) 15mg  Take 1 tablet daily Prevent blood clots  Finasteride (Proscar) 5mg  Take 1 tablet daily Prostate  Ezetimibe (Zetia) 10mg  Take 1 tablet daily Cholesterol  Polyethylene glycol (Miralax) 17g Take 1 packet daily Constipation  Dicyclomine (Bentyl) 20mg  Take 1 tablet 3-4 times daily before meals and at bedtime as needed Digestive symptoms  Vitamin D3 5000 IU Take 1 tablet daily Bone health  Tests Ordered Today: Comprehensive Metabolic Panel Complete Blood Count (CBC) Cholesterol Panel Hemoglobin A1c (diabetes test) Vitamin D Level PSA (Prostate Test) Thyroid Function (TSH) Referrals & Consultations: Physical Therapy - Continued therapy for spinal stenosis Consider cardiology consultation to discuss ablation for atrial fibrillation Consider urology consultation to discuss prostate management options SEEK IMMEDIATE MEDICAL ATTENTION if you experience: Severe dizziness, fainting, or chest pain Sudden weakness, numbness, or trouble speaking Falls resulting in injury Severe shortness of breath     SELF-CARE RECOMMENDATIONS: ?? Heart Health: Stay well-hydrated to help control your atrial fibrillation ?? Spine Care: Continue using your cane and performing physical therapy exercises ?? Dry Mouth: Use over-the-counter mouth moisturizers as needed ?? Diet: Continue your diet rich in fish and vegetables ?? Hydration: Drink plenty of water, especially when taking medications    Follow-up Plan:  Return to clinic in 6 months (October 2025)    Continue monitoring blood pressure at home    Attend  physical therapy sessions as scheduled    Complete laboratory testing before your next visit REMINDER: Influenza vaccination will be due in August 2025. Your next Medicare Annual Wellness Visit is due in January 2026.  Please call our office at [phone] if you have any questions or concerns.      Building a Complete Picture  To create the most effective care plan possible, we may need additional information from previous providers. We encouraged you to gather any relevant medical records for your next visit. This will help [place] build a more complete picture and develop a personalized plan together. In the meantime, we'll address your immediate concerns and provide resources to help you manage all of your medical issues.  We encourage you to use MyChart to review these efforts, and to help [place] find and correct any omissions or errors in your medical chart.  Managing Your Health Over Time  Managing every aspect of your health in a single visit isn't always feasible, but that's okay.  We addressed your most pressing concerns today and charted a course for future care. Acute conditions or preventive care measures may require further attention.  We encourage you to schedule a follow-up visit at your earliest convenience to discuss any unresolved issues.  We strongly encourage participation in annual preventive care visits to help [place] develop a more thorough understanding of your health and to help you maintain optimal wellness - please inquire about scheduling your next one with [place] at your earliest convenience.  Your Satisfaction Matters  It was a pleasure seeing you today!  Your health and satisfaction will always be my top priorities. If you believe your experience today was worthy of a 5-star rating, I'd be grateful for your feedback!  Lula Olszewski, MD   Next Steps  Schedule Follow-Up:  We recommend a follow-up appointment in Return in about 3 months (around 08/30/2023). If your  condition worsens before then, please call us or seek emergency care. Preventive Care:  Don't forget to schedule your annual preventive care visit!  This important checkup is typically covered by insurance and helps identify potential health issues early.  Typically its 100% insurance covered with no co-pay and helps to get surveillance labwork paid for through your insurance provider.  Sometimes it even lowers your insurance premiums to participate. Medical Information Release:  For any relevant medical information we don't have, please sign a release form so we can obtain it for your records. Lab & X-ray Appointments:  Scheduled any incomplete lab tests today or call us to schedule.  X-Rays can be done without an appointment at [facility] at [facility] ([address], [address]), M-F 8:30am-noon or 1pm-5pm.  Just tell them you're there for X-rays ordered by Dr. Jon Billings.  We'll receive the results and contact you by phone or MyChart to discuss next steps.  Bring to Your Next Appointment  Medications: Please bring all your medication bottles to your next appointment to ensure we have an accurate record of your prescriptions. Health Diaries: If you're monitoring any health conditions at home, keeping a diary of your readings can be very helpful for discussions at your next appointment.  Reviewing Your Records  Please Review this early draft of your clinical notes below and the final encounter summary tomorrow on MyChart after its been completed.   Labile hypertension  AVM (arteriovenous  malformation)  Elevated prostate specific antigen (PSA)  History of elevated glucose -     Hemoglobin A1c  Mixed hyperlipidemia -     Ezetimibe; Take 1 tablet (10 mg total) by mouth daily.  Dispense: 90 tablet; Refill: 3 -     Lipid Panel w/reflex Direct LDL  Vitamin D deficiency -     Vitamin D-3; Take 1 tablet by mouth daily at 6 (six) AM.  Dispense: 90 tablet; Refill: 3 -     VITAMIN D 25 Hydroxy  (Vit-D Deficiency, Fractures)  Spinal stenosis of lumbar region with neurogenic claudication -     Ambulatory referral to Physical Therapy  Sinus bradycardia  Persistent atrial fibrillation: CHA2DS2Vasc = 3 (Age & HTN) -     Comprehensive metabolic panel with GFR -     CBC with Differential/Platelet; Future  Hyperplasia of prostate with lower urinary tract symptoms (LUTS)  Atrial fibrillation, unspecified type (HCC) -     dilTIAZem HCl ER Coated Beads; Take  1 capsule  Daily  with Food for BP & Heart Rhythm  Dispense: 90 capsule; Refill: 3 -     Rivaroxaban; Take  1 tablet  Daily  for Atrial Fibrillation & to Prevent Blood Clots  Dispense: 90 tablet; Refill: 3 -     Flecainide Acetate; Take 1 & 1/2  tablet (75 mg)   2 x /day  (every 12 hours )        for Afib  Dispense: 270 tablet; Refill: 3 -     TSH Rfx on Abnormal to Free T4  BPH with obstruction/lower urinary tract symptoms -     Finasteride; Take  1 tablet  Daily for Prostate  Dispense: 90 tablet; Refill: 3 -     PSA  Irritable bowel syndrome with both constipation and diarrhea -     Dicyclomine HCl; TAKE 1 TABLET BY MOUTH 3 TO 4 TIMES PER DAY BEFORE MEALS AND AT BEDTIME AS NEEDED FOR NAUSEA OR CRAMPING OR BLOATING OR DIARRHEA  Dispense: 364 tablet; Refill: 1 -     Polyethylene Glycol 3350; Take 17 g by mouth daily.  Dispense: 90 each; Refill: 3  Ambulates with cane     Getting Answers and Following Up  Simple Questions & Concerns: For quick questions or basic follow-up after your visit, reach [place] at [phone] or MyChart messaging. Complex Concerns: If your concern is more complex, scheduling an appointment might be best. Discuss this with the staff to find the most suitable option. Lab & Imaging Results: We'll contact you directly if results are abnormal or you don't use MyChart. Most normal results will be on MyChart within 2-3 business days, with a review message from Dr. Jon Billings. Haven't heard back in 2 weeks? Need  results sooner? Contact us at [phone]. Referrals: Our referral coordinator will manage specialist referrals. The specialist's office should contact you within 2 weeks to schedule an appointment. Call us if you haven't heard from them after 2 weeks.  Staying Connected  MyChart: Activate your MyChart for the fastest way to access results and message [place]. See the last page of this paperwork for instructions.  Billing  X-ray & Lab Orders: These are billed by separate companies. Contact the invoicing company directly for questions or concerns. Visit Charges: Discuss any billing inquiries with our administrative services team.  Feedback & Satisfaction  Share Your Experience: We strive for your satisfaction! If you have any complaints, please let Dr. Jon Billings know directly or contact our Practice  Administrators, Olinda Bertrand or [employer], by asking at the front desk.  Scheduling Tips  Shorter Wait Times: 8 am and 1 pm appointments often have the quickest wait times. Longer Appointments: If you need more time during your visit, talk to the front desk. Due to insurance regulations, multiple back-to-back appointments might be necessary.        ?? 1. Medication Interactions of Clinical Significance ?? Flecainide + Diltiazem Interaction: Diltiazem can increase serum levels of flecainide by inhibiting CYP2D6, potentially enhancing proarrhythmic effects or AV nodal suppression. Action: If already tolerated, this combo is sometimes used intentionally to control Afib, but monitor ECG, PR/QRS intervals, and symptoms (fatigue, bradycardia, dizziness). ?? Rivaroxaban + Diltiazem Interaction: Diltiazem may increase rivaroxaban levels via P-gp and CYP3A4 inhibition, increasing bleeding risk. Action: Monitor for bleeding signs. Often co-prescribed but should be done with caution, especially in older adults or CKD. ?? Hyoscyamine + Dicyclomine Interaction: Both are anticholinergics, and  combining them increases the risk of anticholinergic burden (dry mouth, constipation, cognitive effects, urinary retention, blurry vision). Action: Patient is not currently taking hyoscyamine, but if resumed, watch for additive side effects. ?? Dicyclomine + Flecainide Interaction: Additive anticholinergic effects may worsen conduction delay and increase arrhythmia risk. Action: Monitor ECG closely, especially if PR/QRS prolongation is present.  ?? 2. Medications That May Cause Constipation Medication Constipation Risk Notes  Dicyclomine (Bentyl) ? High Anticholinergic effect  Hyoscyamine (Levsin) ? High Not currently taking, but also anticholinergic  Finasteride ? Low/unlikely Rarely reported  Multivitamin ? Variable Depends on iron/calcium content (not specified)  Calcium-containing supplements ? Not listed but would contribute   Polyethylene glycol (Miralax) ? Used to treat constipation   Diltiazem ? Possible Calcium channel blockers can reduce GI motility  Flecainide ? Rare Not known for GI slowing  Vitamin D3 (Cholecalciferol) ? Unlikely Very rare at typical doses   ?? Summary ?? Key Interaction Alerts Diltiazem + Flecainide ? ? Flecainide levels, monitor rhythm/QRS Diltiazem + Rivaroxaban ? ? Bleeding risk Dicyclomine + Hyoscyamine (if resumed) ? ? Anticholinergic burden ?? Likely Constipation Contributors Dicyclomine Diltiazem Possibly multivitamin (if it contains iron or calcium) ?? Notes Polyethylene glycol likely prescribed to counteract constipation. Maxitrol and Cortisporin eye drops unlikely to contribute systemically.

## 2023-05-31 NOTE — Assessment & Plan Note (Signed)
 Assessment: Chronic constipation likely medication-related, particularly from diltiazem. Currently managed with polyethylene glycol. Plan: Continue polyethylene glycol 17g daily Increase dietary fiber and fluid intake Consider alternative cardiac medications with cardiology if symptoms become severe Follow up as needed for symptom management

## 2023-05-31 NOTE — Assessment & Plan Note (Signed)
 Assessment: PSA trending upward with today's level at 8.48 ng/mL, previous value 4.94 ng/mL in October 2024. Patient on finasteride which typically lowers PSA. Remote history of possible prostate cancer diagnosis. Plan: Continue monitoring PSA levels; recheck in 6 months Urology referral recommended given significant increase in PSA despite finasteride Discussed risks/benefits of further diagnostic evaluation including prostate biopsy Patient preference for conservative management acknowledged and respected

## 2023-05-31 NOTE — Assessment & Plan Note (Signed)
 Assessment: Blood pressure fluctuates with occasional systolic readings reaching 158 mmHg on home monitoring. Today's office reading is controlled at 130/64. No end-organ damage identified. May be contributing to atrial fibrillation and progressive aortic valve sclerosis. Plan: Continue current antihypertensive management Maintain home blood pressure monitoring, preferably same time each morning Lifestyle counseling regarding sodium restriction and regular physical activity as tolerated Consider 24-hour ambulatory monitoring if home readings remain significantly elevated Follow up in 6 months with blood pressure log

## 2023-05-31 NOTE — Assessment & Plan Note (Signed)
 Assessment: Up-to-date on pneumococcal vaccination, COVID-19 vaccination, and RSV vaccination. Influenza vaccination due in August 2025. Medicare Annual Wellness visit due January 2026. Plan: Influenza vaccination in fall 2025 Continue age-appropriate cancer screening Medicare Annual Wellness visit next January Advance care planning reviewed Laboratory Surveillance Comprehensive metabolic panel with GFR ordered CBC with differential ordered Lipid panel ordered Hemoglobin A1c ordered Vitamin D level ordered PSA ordered TSH with reflex to Free T4 if abnormal Medications renewed: Diltiazem 120 mg daily Rivaroxaban 15 mg daily Finasteride 5 mg daily Flecainide 50 mg tablets (take 1.5 tablets twice daily) Dicyclomine 20 mg (take 1 tablet 3-4 times daily as needed) Ezetimibe 10 mg daily Polyethylene glycol 17g packet daily Vitamin D3 5000 IU daily Follow-up: Return to clinic in 6 months for routine follow-up. Sooner if symptoms worsen.

## 2023-05-31 NOTE — Progress Notes (Signed)
 [facility]  Phone: [phone]  - Medical Office Visit -  Visit Date: 05/31/2023 Patient: Eugene Friedman   DOB: 05-16-1928   89 y.o. Male  MRN: [id number] Patient Care Team: Lula Olszewski, MD as PCP - General (Internal Medicine) Nelson Chimes, MD as Consulting Physician (Ophthalmology) Nahser, Deloris Ping, MD as Consulting Physician (Cardiology) Darnell Level, MD as Consulting Physician (General Surgery) Rene Paci, MD as Consulting Physician (Urology) Etta Quill, MD as Referring Physician (Cardiology) Today's Health Care Provider: Lula Olszewski, MD  ===========================================    Chief Complaint / Reason for Visit: new pt (Pt is present to est care with pcp and he is fasting for labs today. He is leaving out of town soon.)  Background: 89 y.o. male who has Hyperlipidemia; Vitamin D deficiency; History of elevated glucose; Elevated prostate specific antigen (PSA); Labile hypertension; Prostate cancer (HCC); Spinal stenosis, lumbar; Persistent atrial fibrillation: CHA2DS2Vasc = 3 (Age & HTN); AVM (arteriovenous malformation); Stage 50b chronic kidney disease (HCC); Aortic valve sclerosis; BPH with obstruction/lower urinary tract symptoms; Healthcare maintenance; Chronic kidney disease (CKD), active medical management without dialysis, stage 2 (mild); Dry mouth; Ambulates with cane; and Irritable bowel syndrome with both constipation and diarrhea on their problem list.  Discussed the use of AI scribe software for clinical note transcription with the patient, who gave verbal consent to proceed.  History of Present Illness Patient is a 89 year old male with multiple chronic medical conditions who presents for routine follow-up. He reports his atrial fibrillation is generally stable as long as he maintains adequate hydration. He is currently taking diltiazem 120 mg, flecainide 75 mg twice daily, and rivaroxaban 15 mg for  rhythm and stroke prevention. He has undergone two cardioversions in the past and mentions it took considerable time to optimize his medication regimen. He monitors his condition with his Apple Watch and reports dry mouth, which he attributes to the diltiazem.  Regarding his hypertension, the patient describes it as "unstable" with fluctuations in his home readings. He has observed systolic readings occasionally reaching 158 mmHg. Today's blood pressure is 130/64, which falls within acceptable limits. He monitors his blood pressure regularly, typically in the morning.  His lumbar spinal stenosis causes significant back pain and progressive difficulty with ambulation. He now requires a cane for stability after experiencing falls. The myelopathy is noticeably progressing, with increased weakness in his left leg compared to the right and diminishing sensation in both feet, left greater than right. He attends physical therapy with Beverly Gust at [facility], which provides some benefit. His previous ambulatory capacity of 30-minute walks has diminished considerably.  The patient has a history of prostate issues with elevated PSA levels, sometimes around 6 ng/mL, and today's reading is 8.48 ng/mL. He reports a history of prostate cancer diagnosed approximately 25 years ago. He has elected not to undergo radical prostatectomy due to concerns about potential complications. He experiences significant nocturia, awakening hourly during the night, which has persisted for approximately 15 years. He takes finasteride for management.  He has a previous history of arteriovenous malformation (AVM), which was surgically treated at [facility] in 2020 with good results and no ongoing issues. Other surgical history includes appendectomy at age 76, left partial knee replacement in 2013, and a mastoidectomy on the left in 1936 at age 50-4.  The patient reports constipation, which he manages with polyethylene glycol  (Miralax). He attributes this symptom to his cardiac medications. He takes ezetimibe for cholesterol management with good  HDL levels and follows a diet rich in fish and vegetables, particularly when in [place]. He has completed his COVID-19 vaccinations and reports having contracted COVID twice while traveling to [place], though with mild symptoms.  Vital signs today show BP 130/64, pulse 59, temperature 97.42F, weight 195 lb 9.6 oz (decreased from 200 lb 6.4 oz in January 2025), SpO2 98%, and BMI 25.81 kg/m.    Problem overviews updated today: Problem  Bph With Obstruction/Lower Urinary Tract Symptoms   20 years of hourly nocturia On proscar Following with urology   Healthcare Maintenance  Chronic Kidney Disease (Ckd), Active Medical Management Without Dialysis, Stage 2 (Mild)  Dry Mouth  Ambulates With Cane  Irritable Bowel Syndrome With Both Constipation and Diarrhea  Aortic Valve Sclerosis  Persistent atrial fibrillation: CHA2DS2Vasc = 3 (Age & HTN)   Not following with cardiology History cardioversion x2 Takes Xarelto diltiazem flecainide - one of them causes mouth dryness likely diltiazem Monitors with apple watch   Avm (Arteriovenous Malformation)   Had surgery 2020   Spinal Stenosis, Lumbar   substantial myelopathy, which is progressing quite noticeably to him and his family. He used to go for 30 minute walks for many miles. Now the walking is difficult and he has fallen. His left leg is getting weaker more than his right. He is loosing feeling in his feet, left greater than right. He has not had issues with incontinence. His work up for this included an MRI of this entire spine. Loss of sensation entire left foot, loss of position sense MRI: spinal chord edema, abnormal vascular signals Walks with cane   Vitamin D Deficiency   Clinical symptoms that can be associated with vitamin D deficiency include bone pain, muscle weakness, and general fatigue Vitamin D levels are  affected by dietary choices and sunlight exposure  Lab Results  Component Value Date   VD25OH 77 06/12/2022   VD25OH 60 12/09/2021   VD25OH 74 12/09/2020   VD25OH 77 05/27/2020   VD25OH 66 11/23/2019   Lab Results  Component Value Date   TSH 2.82 12/15/2022   ALKPHOS 81 01/30/2022   PTH 54 12/15/2022   CALCIUM 9.5 12/15/2022   Lab Results  Component Value Date   CALCIUM 9.5 12/15/2022   CALCIUM 9.2 06/12/2022   CALCIUM 9.1 02/03/2022   CALCIUM 8.9 01/30/2022   CALCIUM 9.2 12/09/2021   CALCIUM 9.2 12/09/2021   Complicated by ataxia    History of Elevated Glucose   Lab Results  Component Value Date/Time   GLUCOSE 93 12/15/2022 11:10 AM   GLUCOSE 94 06/12/2022 12:01 PM   GLUCOSE 96 02/03/2022 10:47 AM   GLUCOSE 103 (H) 01/30/2022 03:44 PM   GLUCOSE 87 12/09/2021 12:00 AM   GLUCOSE 88 06/11/2021 11:15 AM   GLUCOSE 123 (H) 12/09/2020 03:02 PM   GLUCOSE 86 05/27/2020 02:41 PM   GLUCOSE 94 02/27/2020 10:23 AM   GLUCOSE 94 11/23/2019 03:04 PM      Elevated Prostate Specific Antigen (Psa)   On proscar Lab Results  Component Value Date   PSA 4.94 (H) 12/15/2022   PSA 6.04 (H) 12/09/2021   PSA 4.52 (H) 06/11/2021   PSA 4.09 (H) 03/06/2021   PSA 4.52 (H) 12/09/2020   PSA 3.8 04/27/2019   PSA 3.0 10/26/2018   PSA 3.2 03/17/2017   PSA 3.7 10/30/2016   PSA 3.0 12/16/2015   PSA 3.30 06/21/2015  Remote history ?prostate cancer prostatectomy not done   Labile Hypertension   BP Readings from Last  30 Encounters:  05/31/23 130/64  04/15/23 (!) 140/66  03/17/23 130/80  12/15/22 138/80  06/12/22 (!) 158/82  06/08/22 (!) 158/80  03/11/22 138/70  02/03/22 (!) 150/70  01/30/22 137/86  12/09/21 (!) 140/82  11/20/21 130/70  06/11/21 140/70  03/06/21 (!) 150/80  12/09/20 (!) 160/70  11/07/20 118/70  05/27/20 124/72  02/27/20 128/70  11/23/19 (!) 148/72  07/19/19 136/82  06/14/19 120/68  04/27/19 118/70  02/20/19 132/60  11/22/18 126/80  11/07/18 (!) 144/82   10/26/18 (!) 162/84  10/04/18 (!) 146/84  08/18/18 (!) 142/84  06/08/18 130/86  03/24/18 137/82  02/04/18 120/78        Hyperlipidemia   Medications: on zetia only, high HDL, used to take a statin "did that for a while" Lab Results  Component Value Date   HDL 92 12/15/2022   HDL 93 06/12/2022   HDL 89 12/09/2021   CHOLHDL 2.2 12/15/2022   CHOLHDL 2.0 06/12/2022   CHOLHDL 2.6 12/09/2021   Lab Results  Component Value Date   LDLCALC 96 12/15/2022   LDLCALC 79 06/12/2022   LDLCALC 124 (H) 12/09/2021   Lab Results  Component Value Date   TRIG 68 12/15/2022   TRIG 95 06/12/2022   TRIG 77 12/09/2021   Lab Results  Component Value Date   CHOL 203 (H) 12/15/2022   CHOL 190 06/12/2022   CHOL 230 (H) 12/09/2021   The ASCVD Risk score (Arnett DK, et al., 2019) failed to calculate for the following reasons:   The 2019 ASCVD risk score is only valid for ages 36 to 73 Lab Results  Component Value Date   ALT 18 12/15/2022   AST 17 12/15/2022   ALKPHOS 81 01/30/2022   TSH 2.82 12/15/2022   HGBA1C 5.4 12/15/2022   Body mass index is 25.81 kg/m.    Sinus Bradycardia (Resolved)   Pulse Readings from Last 3 Encounters:  05/31/23 (!) 59  04/15/23 67  03/17/23 80        Medications updated/reviewed: Current Outpatient Medications on File Prior to Visit  Medication Sig   Multiple Vitamin (MULTIVITAMIN) tablet Take 1 tablet by mouth daily.   neomycin-polymyxin-dexamethasone (MAXITROL) 0.1 % ophthalmic suspension Apply 1 drop to eye 4 (four) times daily.   neomycin-polymyxin-hydrocortisone (CORTISPORIN) 3.5-10000-1 ophthalmic suspension 4 (four) times daily.   No current facility-administered medications on file prior to visit.   Medications Discontinued During This Encounter  Medication Reason   cyclobenzaprine (FLEXERIL) 10 MG tablet Completed Course   COVID-19 mRNA vaccine 2023-2024 (COMIRNATY) syringe Completed Course   COVID-19 mRNA vaccine 2023-2024  (COMIRNATY) syringe Completed Course   RSV vaccine recomb adjuvanted (AREXVY) 120 MCG/0.5ML injection Completed Course   LUTEIN PO Completed Course   hyoscyamine (LEVSIN SL) 0.125 MG SL tablet Completed Course   Cholecalciferol (VITAMIN D-3) 5000 UNITS TABS Reorder   diltiazem (CARDIZEM CD) 120 MG 24 hr capsule Reorder   finasteride (PROSCAR) 5 MG tablet Reorder   flecainide (TAMBOCOR) 50 MG tablet Reorder   Rivaroxaban (XARELTO) 15 MG TABS tablet Reorder   dicyclomine (BENTYL) 20 MG tablet Reorder   ezetimibe (ZETIA) 10 MG tablet Reorder   polyethylene glycol (MIRALAX / GLYCOLAX) 17 g packet Reorder   Current Meds  Medication Sig   Multiple Vitamin (MULTIVITAMIN) tablet Take 1 tablet by mouth daily.   neomycin-polymyxin-dexamethasone (MAXITROL) 0.1 % ophthalmic suspension Apply 1 drop to eye 4 (four) times daily.   neomycin-polymyxin-hydrocortisone (CORTISPORIN) 3.5-10000-1 ophthalmic suspension 4 (four) times daily.   [DISCONTINUED] Cholecalciferol (VITAMIN D-3)  5000 UNITS TABS Take by mouth.   [DISCONTINUED] COVID-19 mRNA vaccine 2023-2024 (COMIRNATY) syringe Inject into the muscle.   [DISCONTINUED] COVID-19 mRNA vaccine 2023-2024 (COMIRNATY) syringe Inject 0.3 mLs into the muscle.   [DISCONTINUED] dicyclomine (BENTYL) 20 MG tablet TAKE 1 TABLET BY MOUTH 3 TO 4 TIMES PER DAY BEFORE MEALS AND AT BEDTIME AS NEEDED FOR NAUSEA OR CRAMPING OR BLOATING OR DIARRHEA   [DISCONTINUED] diltiazem (CARDIZEM CD) 120 MG 24 hr capsule Take  1 capsule  Daily  with Food for BP & Heart Rhythm   [DISCONTINUED] ezetimibe (ZETIA) 10 MG tablet Take 10 mg by mouth daily.   [DISCONTINUED] finasteride (PROSCAR) 5 MG tablet Take  1 tablet  Daily for Prostate   [DISCONTINUED] flecainide (TAMBOCOR) 50 MG tablet Take 1 & 1/2  tablet (75 mg)   2 x /day  (every 12 hours )        for Afib   [DISCONTINUED] polyethylene glycol (MIRALAX / GLYCOLAX) 17 g packet Take 17 g by mouth daily.   [DISCONTINUED] Rivaroxaban  (XARELTO) 15 MG TABS tablet Take  1 tablet  Daily  for Atrial Fibrillation & to Prevent Blood Clots    Allergies:   Allergies as of 05/31/2023 - Review Complete 05/31/2023  Allergen Reaction Noted   Celebrex [celecoxib] Other (See Comments) 02/07/2013   Lipitor [atorvastatin]  02/07/2013   Ppd [tuberculin purified protein derivative] Other (See Comments) 02/07/2013   Adhesive [tape] Rash 02/07/2013   Nickel Rash 02/07/2013   Past Medical History:  has a past medical history of Allergic rhinitis, Atrial fibrillation by electrocardiogram (HCC) (10/2018), Cancer (HCC), DJD (degenerative joint disease), Hyperlipidemia, Hypertension, Neuromuscular disorder (HCC), Sinus bradycardia, Spinal stenosis, and Vitamin D deficiency. Past Surgical History:   has a past surgical history that includes cervical neck mass  (07/08/2006); Appendectomy (1956); Mastoidectomy (Left, 1936); Flexible sigmoidoscopy; Hemorrhoid surgery; Medial partial knee replacement (Left, 2013); and Back Procedure (11/02/2018). Social History:   reports that he has never smoked. He has never used smokeless tobacco. He reports current alcohol use of about 10.0 standard drinks of alcohol per week. He reports that he does not use drugs. Family History:  family history includes Cancer in his maternal grandmother; Diabetes in his maternal aunt, mother, and paternal aunt; Heart attack (age of onset: 17) in his father; Hypertension in his father; Stroke in his mother. Depression Screen and Health Maintenance:    12/15/2022   12:25 AM 12/09/2021   10:26 PM 11/22/2021   11:02 PM 06/11/2021   10:59 AM  PHQ 2/9 Scores  PHQ - 2 Score 0 0 0 0   Health Maintenance  Topic Date Due   Zoster Vaccines- Shingrix (1 of 2) 08/10/1947   COVID-19 Vaccine (11 - 2024-25 season) 07/21/2023   INFLUENZA VACCINE  09/17/2023   Medicare Annual Wellness (AWV)  03/16/2024   DTaP/Tdap/Td (4 - Td or Tdap) 02/12/2031   Pneumonia Vaccine 59+ Years old  Completed    HPV VACCINES  Aged Out   Meningococcal B Vaccine  Aged Out   Immunization History  Administered Date(s) Administered   H1N1 02/17/2007   Influenza Whole 12/05/2012   Influenza, High Dose Seasonal PF 11/14/2013, 11/20/2014, 12/16/2015, 10/30/2016, 02/04/2018, 11/22/2018, 10/18/2019, 11/23/2019, 12/09/2020, 11/20/2021, 12/15/2022   Influenza-Unspecified 12/05/2012   PFIZER(Purple Top)SARS-COV-2 Vaccination 03/09/2019, 03/30/2019, 11/07/2019   Pfizer(Comirnaty)Fall Seasonal Vaccine 12 years and older 12/31/2021, 06/03/2022, 10/28/2022, 05/26/2023   Pneumococcal Conjugate-13 12/05/2014   Pneumococcal Polysaccharide-23 03/08/2008, 05/18/2013   Respiratory Syncytial Virus Vaccine,Recomb Aduvanted(Arexvy) 01/29/2022  Td 06/14/2019   Tdap 02/17/2008, 02/11/2021   Zoster, Live 02/16/2005     Objective   Physical ExamBP 130/64   Pulse (!) 59   Temp (!) 97.5 F (36.4 C) (Temporal)   Ht 6\' 1"  (1.854 m)   Wt 195 lb 9.6 oz (88.7 kg)   SpO2 98%   BMI 25.81 kg/m  Wt Readings from Last 10 Encounters:  05/31/23 195 lb 9.6 oz (88.7 kg)  03/17/23 200 lb 6.4 oz (90.9 kg)  12/15/22 202 lb 3.2 oz (91.7 kg)  06/12/22 198 lb 9.6 oz (90.1 kg)  06/08/22 202 lb (91.6 kg)  03/11/22 198 lb 3.2 oz (89.9 kg)  02/03/22 206 lb (93.4 kg)  01/30/22 200 lb (90.7 kg)  12/09/21 201 lb 9.6 oz (91.4 kg)  11/20/21 198 lb (89.8 kg)  Vital signs reviewed.  Nursing notes reviewed. Weight trend reviewed. Abnormalities and problem-specific physical exam findings:  walks very unsteady without, but very steady with cane General Appearance:  Well developed, well nourished, well-groomed, healthy-appearing male with Body mass index is 25.81 kg/m. No acute distress appreciable.   Skin: Clear and well-hydrated. Pulmonary:  Normal work of breathing at rest, no respiratory distress apparent. SpO2: 98 %  Musculoskeletal: He demonstrates smooth and coordinated movements throughout all major joints.All extremities are  intact.  Neurological:  Awake, alert, oriented, and engaged.  No obvious focal neurological deficits or cognitive impairments.  Sensorium seems unclouded.  Psychiatric:  Appropriate mood, pleasant and cooperative demeanor, cheerful and engaged during the exam  Reviewed Results & Data Results LABS PSA: 6 ng/mL    Results for orders placed or performed in visit on 05/31/23  Comprehensive metabolic panel with GFR  Result Value Ref Range   Sodium 137 135 - 145 mEq/L   Potassium 4.8 3.5 - 5.1 mEq/L   Chloride 103 96 - 112 mEq/L   CO2 26 19 - 32 mEq/L   Glucose, Bld 90 70 - 99 mg/dL   BUN 30 (H) 6 - 23 mg/dL   Creatinine, Ser 1.66 0.40 - 1.50 mg/dL   Total Bilirubin 0.5 0.2 - 1.2 mg/dL   Alkaline Phosphatase 94 39 - 117 U/L   AST 19 0 - 37 U/L   ALT 21 0 - 53 U/L   Total Protein 6.6 6.0 - 8.3 g/dL   Albumin 4.5 3.5 - 5.2 g/dL   GFR 06.30 (L) >16.01 mL/min   Calcium 9.3 8.4 - 10.5 mg/dL  Hemoglobin U9N  Result Value Ref Range   Hgb A1c MFr Bld 5.4 4.6 - 6.5 %  VITAMIN D 25 Hydroxy (Vit-D Deficiency, Fractures)  Result Value Ref Range   VITD 79.84 30.00 - 100.00 ng/mL  PSA  Result Value Ref Range   PSA 8.48 (H) 0.10 - 4.00 ng/mL    Office Visit on 05/31/2023  Component Date Value   Sodium 05/31/2023 137    Potassium 05/31/2023 4.8    Chloride 05/31/2023 103    CO2 05/31/2023 26    Glucose, Bld 05/31/2023 90    BUN 05/31/2023 30 (H)    Creatinine, Ser 05/31/2023 1.45    Total Bilirubin 05/31/2023 0.5    Alkaline Phosphatase 05/31/2023 94    AST 05/31/2023 19    ALT 05/31/2023 21    Total Protein 05/31/2023 6.6    Albumin 05/31/2023 4.5    GFR 05/31/2023 41.75 (L)    Calcium 05/31/2023 9.3    Hgb A1c MFr Bld 05/31/2023 5.4    VITD 05/31/2023 79.84  PSA 05/31/2023 8.48 (H)   Office Visit on 12/15/2022  Component Date Value   Color, Urine 12/15/2022 YELLOW    APPearance 12/15/2022 CLEAR    Specific Gravity, Urine 12/15/2022 1.012    pH 12/15/2022 6.0    Glucose,  UA 12/15/2022 NEGATIVE    Bilirubin Urine 12/15/2022 NEGATIVE    Ketones, ur 12/15/2022 NEGATIVE    Hgb urine dipstick 12/15/2022 NEGATIVE    Protein, ur 12/15/2022 NEGATIVE    Nitrite 12/15/2022 NEGATIVE    Leukocytes,Ua 12/15/2022 NEGATIVE    Creatinine, Urine 12/15/2022 60    Microalb, Ur 12/15/2022 8.5    Microalb Creat Ratio 12/15/2022 142 (H)    PSA 12/15/2022 4.94 (H)    PTH 12/15/2022 54    WBC 12/15/2022 5.2    RBC 12/15/2022 4.27    Hemoglobin 12/15/2022 13.0 (L)    HCT 12/15/2022 39.6    MCV 12/15/2022 92.7    MCH 12/15/2022 30.4    MCHC 12/15/2022 32.8    RDW 12/15/2022 12.8    Platelets 12/15/2022 237    MPV 12/15/2022 9.3    Neutro Abs 12/15/2022 3,266    Absolute Lymphocytes 12/15/2022 1,300    Absolute Monocytes 12/15/2022 473    Eosinophils Absolute 12/15/2022 130    Basophils Absolute 12/15/2022 31    Neutrophils Relative % 12/15/2022 62.8    Total Lymphocyte 12/15/2022 25.0    Monocytes Relative 12/15/2022 9.1    Eosinophils Relative 12/15/2022 2.5    Basophils Relative 12/15/2022 0.6    Glucose, Bld 12/15/2022 93    BUN 12/15/2022 34 (H)    Creat 12/15/2022 1.49 (H)    eGFR 12/15/2022 44 (L)    BUN/Creatinine Ratio 12/15/2022 23 (H)    Sodium 12/15/2022 139    Potassium 12/15/2022 4.9    Chloride 12/15/2022 104    CO2 12/15/2022 27    Calcium 12/15/2022 9.5    Total Protein 12/15/2022 6.8    Albumin 12/15/2022 4.3    Globulin 12/15/2022 2.5    AG Ratio 12/15/2022 1.7    Total Bilirubin 12/15/2022 0.5    Alkaline phosphatase (AP* 12/15/2022 85    AST 12/15/2022 17    ALT 12/15/2022 18    Magnesium 12/15/2022 2.1    Cholesterol 12/15/2022 203 (H)    HDL 12/15/2022 92    Triglycerides 12/15/2022 68    LDL Cholesterol (Calc) 12/15/2022 96    Total CHOL/HDL Ratio 12/15/2022 2.2    Non-HDL Cholesterol (Cal* 12/15/2022 111    TSH 12/15/2022 2.82    Hgb A1c MFr Bld 12/15/2022 5.4    Mean Plasma Glucose 12/15/2022 108    eAG (mmol/L) 12/15/2022  6.0    Insulin 12/15/2022 11.3   Office Visit on 06/12/2022  Component Date Value   WBC 06/12/2022 5.5    RBC 06/12/2022 4.18 (L)    Hemoglobin 06/12/2022 12.8 (L)    HCT 06/12/2022 38.2 (L)    MCV 06/12/2022 91.4    MCH 06/12/2022 30.6    MCHC 06/12/2022 33.5    RDW 06/12/2022 13.2    Platelets 06/12/2022 232    MPV 06/12/2022 9.3    Neutro Abs 06/12/2022 3,542    Lymphs Abs 06/12/2022 1,183    Absolute Monocytes 06/12/2022 567    Eosinophils Absolute 06/12/2022 160    Basophils Absolute 06/12/2022 50    Neutrophils Relative % 06/12/2022 64.4    Total Lymphocyte 06/12/2022 21.5    Monocytes Relative 06/12/2022 10.3    Eosinophils Relative 06/12/2022 2.9  Basophils Relative 06/12/2022 0.9    Glucose, Bld 06/12/2022 94    BUN 06/12/2022 27 (H)    Creat 06/12/2022 1.29 (H)    eGFR 06/12/2022 52 (L)    BUN/Creatinine Ratio 06/12/2022 21    Sodium 06/12/2022 136    Potassium 06/12/2022 4.7    Chloride 06/12/2022 103    CO2 06/12/2022 27    Calcium 06/12/2022 9.2    Total Protein 06/12/2022 6.3    Albumin 06/12/2022 4.3    Globulin 06/12/2022 2.0    AG Ratio 06/12/2022 2.2    Total Bilirubin 06/12/2022 0.6    Alkaline phosphatase (AP* 06/12/2022 89    AST 06/12/2022 20    ALT 06/12/2022 21    Cholesterol 06/12/2022 190    HDL 06/12/2022 93    Triglycerides 06/12/2022 95    LDL Cholesterol (Calc) 06/12/2022 79    Total CHOL/HDL Ratio 06/12/2022 2.0    Non-HDL Cholesterol (Cal* 06/12/2022 97    Hgb A1c MFr Bld 06/12/2022 5.4    Mean Plasma Glucose 06/12/2022 108    eAG (mmol/L) 06/12/2022 6.0    Vit D, 25-Hydroxy 06/12/2022 77          Assessment & Plan Labile hypertension Assessment: Blood pressure fluctuates with occasional systolic readings reaching 158 mmHg on home monitoring. Today's office reading is controlled at 130/64. No end-organ damage identified. May be contributing to atrial fibrillation and progressive aortic valve sclerosis. Plan: Continue  current antihypertensive management Maintain home blood pressure monitoring, preferably same time each morning Lifestyle counseling regarding sodium restriction and regular physical activity as tolerated Consider 24-hour ambulatory monitoring if home readings remain significantly elevated Follow up in 6 months with blood pressure log AVM (arteriovenous malformation) Assessment: History of arteriovenous malformation surgically treated at [facility] in 2020. No current symptoms or complications. Plan: No active management required Monitor for any new neurological symptoms Elevated prostate specific antigen (PSA) Assessment: PSA trending upward with today's level at 8.48 ng/mL, previous value 4.94 ng/mL in October 2024. Patient on finasteride which typically lowers PSA. Remote history of possible prostate cancer diagnosis. Plan: Continue monitoring PSA levels; recheck in 6 months Urology referral recommended given significant increase in PSA despite finasteride Discussed risks/benefits of further diagnostic evaluation including prostate biopsy Patient preference for conservative management acknowledged and respected History of elevated glucose  Mixed hyperlipidemia Assessment: Currently managed with ezetimibe 10 mg daily. Recent lipid panel shows total cholesterol 203 mg/dL (borderline elevated), HDL 92 mg/dL (excellent), LDL 96 mg/dL (at goal), and triglycerides 68 mg/dL (optimal). Patient follows a heart-healthy diet rich in fish and vegetables. Plan: Continue ezetimibe 10 mg daily Maintain current dietary approach Recheck lipid panel in 6 months ASCVD risk assessment discussed; formal calculation not applicable due to age >76 Vitamin D deficiency Assessment: Prior diagnosis of vitamin D deficiency now resolved with supplementation. Current 25-OH vitamin D level of 79.84 ng/mL is within optimal range. Plan: Continue vitamin D3 5000 IU daily Recheck level in 12 months Continue calcium-rich  diet Spinal stenosis of lumbar region with neurogenic claudication Assessment: Progressive myelopathy with worsening weakness, greater in left leg than right, and diminishing sensation in feet. Patient now requires a cane for ambulation and has experienced falls. MRI shows spinal cord edema and abnormal vascular signals. Condition significantly impacts quality of life and mobility. Patient attends physical therapy with some benefit. Plan: Continue physical therapy with Beverly Gust at [facility] Ambulatory physical therapy referral placed today Discussed surgical intervention options; not recommended at this time due  to anesthetic risks Consider neurosurgical consultation if neurological symptoms progress further Fall prevention counseling provided Optimize pain management with current regimen Follow up in 3 months to reassess neurological status Sinus bradycardia Assessment: Pulse 59 today, likely medication-related from beta-blocking effects of diltiazem. Asymptomatic and not requiring intervention at this time. Resolved from problem list Plan: Continue monitoring pulse with vital signs No intervention needed as patient remains asymptomatic Educate patient on symptoms that would warrant concern (dizziness, syncope) Longstanding persistent atrial fibrillation (HCC) Assessment: Persistent atrial fibrillation with CHA2DS2-VASc score of 3 (age and hypertension). Currently managed with diltiazem 120 mg daily, flecainide 75 mg twice daily, and rivaroxaban 15 mg daily. Patient has undergone cardioversion twice previously. Condition remains stable with adequate hydration and medication adherence. Patient monitors rhythm with Apple Watch. Plan: Continue diltiazem 120 mg daily, flecainide 75 mg twice daily, and rivaroxaban 15 mg daily Discussed benefits of catheter ablation given medication side effects and long-term management challenges; recent advances have improved procedure efficacy  and safety Recommend cardiology consultation to discuss ablation as a potentially definitive treatment option Maintain adequate hydration Continue rhythm monitoring with Apple Watch Follow up in 6 months or sooner if symptoms worsen BPH with obstruction/lower urinary tract symptoms Assessment: 15+ year history of nocturia occurring hourly. Currently managed with finasteride 5 mg daily. Associated with elevated PSA levels, most recent 8.48 ng/mL (increased from previous readings). Remote history of possible prostate cancer diagnosis 25 years ago, though patient declined prostatectomy at that time. Plan: Continue finasteride 5 mg daily Discussed procedural options including minimally invasive approaches (nuclear and microwave treatments) as alternatives to surgery Recommended urology follow-up to discuss rising PSA and treatment options Continue monitoring PSA levels; next check in 6 months Consider medical management with alpha-blocker if symptoms worsen Irritable bowel syndrome with both constipation and diarrhea Assessment: Chronic constipation likely medication-related, particularly from diltiazem. Currently managed with polyethylene glycol. Plan: Continue polyethylene glycol 17g daily Increase dietary fiber and fluid intake Consider alternative cardiac medications with cardiology if symptoms become severe Follow up as needed for symptom management Ambulates with cane  Dry mouth Assessment: Persistent symptom likely related to diltiazem therapy. Impacts quality of life but patient managing with over-the-counter mouth moisturizers. Plan: Continue current management with OTC mouth moisturizers Discussed potential medication adjustment with cardiologist if symptom becomes intolerable Maintain adequate hydration Consider artificial saliva products if symptoms worsen Chronic kidney disease (CKD), active medical management without dialysis, stage 2 (mild) Assessment: GFR 41.75 mL/min  consistent with moderate CKD stage 3a. BUN elevated at 30 mg/dL. Creatinine 1.45 mg/dL, at upper limit of normal range. Likely age-related decline without other obvious etiology. No proteinuria previously detected. Plan: Monitor renal function with comprehensive metabolic panel every 6 months Avoid nephrotoxic medications Maintain adequate hydration Blood pressure control as noted above Consider nephrology referral if GFR declines below 30 mL/min Healthcare maintenance Assessment: Up-to-date on pneumococcal vaccination, COVID-19 vaccination, and RSV vaccination. Influenza vaccination due in August 2025. Medicare Annual Wellness visit due January 2026. Plan: Influenza vaccination in fall 2025 Continue age-appropriate cancer screening Medicare Annual Wellness visit next January Advance care planning reviewed Laboratory Surveillance Comprehensive metabolic panel with GFR ordered CBC with differential ordered Lipid panel ordered Hemoglobin A1c ordered Vitamin D level ordered PSA ordered TSH with reflex to Free T4 if abnormal Medications renewed: Diltiazem 120 mg daily Rivaroxaban 15 mg daily Finasteride 5 mg daily Flecainide 50 mg tablets (take 1.5 tablets twice daily) Dicyclomine 20 mg (take 1 tablet 3-4 times daily as needed) Ezetimibe 10 mg daily  Polyethylene glycol 17g packet daily Vitamin D3 5000 IU daily Follow-up: Return to clinic in 6 months for routine follow-up. Sooner if symptoms worsen.  Ambulatory referral to Physical Therapy         diltiazem (CARDIZEM CD) 120 MG 24 hr capsule         Rivaroxaban (XARELTO) 15 MG TABS tablet         finasteride (PROSCAR) 5 MG tablet         flecainide (TAMBOCOR) 50 MG tablet       Note to Pharmacy: Patient knows to take by mouth  !   - Thanks    dicyclomine (BENTYL) 20 MG tablet       Note to Pharmacy: **Patient requests 90 days supply**    ezetimibe (ZETIA) 10 MG tablet  Daily         polyethylene glycol (MIRALAX / GLYCOLAX)  17 g packet  Daily         Cholecalciferol (VITAMIN D-3) 125 MCG (5000 UT) TABS  Daily         TSH Rfx on Abnormal to Free T4         Lipid Panel w/reflex Direct LDL         Comprehensive metabolic panel with GFR         CBC with Differential/Platelet         Hemoglobin A1c         VITAMIN D 25 Hydroxy (Vit-D Deficiency, Fractures)         PSA            Recommended follow up: Return in about 3 months (around 08/30/2023). He will follow up in 6 as he is doing international travel. Future Appointments  Date Time Provider Department Center  11/10/2023  9:00 AM Anthon Kins, MD [facility]         Additional notes: This document was synthesized by artificial intelligence ([employer]bridge) using HIPAA-compliant recording of the clinical interaction;   We discussed the use of AI scribe software for clinical note transcription with the patient, who gave verbal consent to proceed.    Additional Info: This encounter employed state-of-the-art, real-time, collaborative documentation. The patient actively reviewed and assisted in updating their electronic medical record on a shared screen, ensuring transparency and facilitating joint problem-solving for the problem list, overview, and plan. This approach promotes accurate, informed care. The treatment plan was discussed and reviewed in detail, including medication safety, potential side effects, and all patient questions. We confirmed understanding and comfort with the plan. Follow-up instructions were established, including contacting the office for any concerns, returning if symptoms worsen, persist, or new symptoms develop, and precautions for potential emergency department visits.  Initial Appointment Goals:  This initial visit focused on establishing a foundation for the patient's care. We collaboratively reviewed his medical history and medications in detail, updating the chart as shown in the encounter. Given the extensive information, we  prioritized addressing his most pressing concerns, which he reported were: new pt (Pt is present to est care with pcp and he is fasting for labs today. He is leaving out of town soon.)  While the complexity of the patient's medical picture may necessitate further evaluation in subsequent visits, we were able to develop a preliminary care plan together. To expedite a comprehensive plan at the next visit, we encouraged the patient to gather relevant medical records from previous providers. This collaborative approach will ensure a more complete understanding of the patient's health and inform  the development of a personalized care plan. We look forward to continuing the conversation and working together with the patient on achieving his health goals.   Collaborative Documentation:  Today's encounter utilized real-time, dynamic patient engagement.  Patients actively participate by directly reviewing and assisting in updating their medical records through a shared screen. This transparency empowers patients to visually confirm chart updates made by the healthcare provider.  This collaborative approach facilitates problem management as we jointly update the problem list, problem overview, and assessment/plan. Ultimately, this process enhances chart accuracy and completeness, fostering shared decision-making, patient education, and informed consent for tests and treatments.  Collaborative Treatment Planning:  Treatment plans were discussed and reviewed in detail.  Explained medication safety and potential side effects.  Encouraged participation and answered all patient questions, confirming understanding and comfort with the plan. Encouraged patient to contact our office if they have any questions or concerns. Agreed on patient returning to office if symptoms worsen, persist, or new symptoms develop.  ----------------------------------------------------- Anthon Kins, MD  05/31/2023 7:08 PM  [facility]:  [phone]

## 2023-05-31 NOTE — Assessment & Plan Note (Signed)
 Assessment: Persistent symptom likely related to diltiazem therapy. Impacts quality of life but patient managing with over-the-counter mouth moisturizers. Plan: Continue current management with OTC mouth moisturizers Discussed potential medication adjustment with cardiologist if symptom becomes intolerable Maintain adequate hydration Consider artificial saliva products if symptoms worsen

## 2023-05-31 NOTE — Assessment & Plan Note (Signed)
 Assessment: Prior diagnosis of vitamin D deficiency now resolved with supplementation. Current 25-OH vitamin D level of 79.84 ng/mL is within optimal range. Plan: Continue vitamin D3 5000 IU daily Recheck level in 12 months Continue calcium-rich diet

## 2023-05-31 NOTE — Assessment & Plan Note (Signed)
 Assessment: Progressive myelopathy with worsening weakness, greater in left leg than right, and diminishing sensation in feet. Patient now requires a cane for ambulation and has experienced falls. MRI shows spinal cord edema and abnormal vascular signals. Condition significantly impacts quality of life and mobility. Patient attends physical therapy with some benefit. Plan: Continue physical therapy with Adonna Hoots at [facility] Ambulatory physical therapy referral placed today Discussed surgical intervention options; not recommended at this time due to anesthetic risks Consider neurosurgical consultation if neurological symptoms progress further Fall prevention counseling provided Optimize pain management with current regimen Follow up in 3 months to reassess neurological status

## 2023-05-31 NOTE — Assessment & Plan Note (Signed)
 Assessment: Pulse 59 today, likely medication-related from beta-blocking effects of diltiazem. Asymptomatic and not requiring intervention at this time. Resolved from problem list Plan: Continue monitoring pulse with vital signs No intervention needed as patient remains asymptomatic Educate patient on symptoms that would warrant concern (dizziness, syncope)

## 2023-06-01 ENCOUNTER — Encounter: Payer: Self-pay | Admitting: Internal Medicine

## 2023-06-01 LAB — LIPID PANEL W/REFLEX DIRECT LDL
Cholesterol: 207 mg/dL — ABNORMAL HIGH (ref <200)
HDL: 99 mg/dL (ref >=40)
LDL Cholesterol (Calc): 94 mg/dL
Non-HDL Cholesterol (Calc): 108 mg/dL (ref <130)
Total CHOL/HDL Ratio: 2.1 (calc) (ref <5.0)
Triglycerides: 55 mg/dL (ref <150)

## 2023-06-01 LAB — TSH RFX ON ABNORMAL TO FREE T4: TSH: 3.29 u[IU]/mL (ref 0.450–4.500)

## 2023-06-15 ENCOUNTER — Ambulatory Visit: Payer: Medicare Other | Admitting: Nurse Practitioner

## 2023-10-17 ENCOUNTER — Ambulatory Visit (HOSPITAL_COMMUNITY): Payer: Self-pay

## 2023-10-19 ENCOUNTER — Telehealth: Payer: Self-pay

## 2023-10-19 NOTE — Telephone Encounter (Signed)
 Copied from CRM #[id number]. Topic: Clinical - Medical Advice >> Oct 19, 2023  2:41 PM Tinnie BROCKS wrote: Reason for CRM: Eugene Friedman, daughter on [place], Ey:[id number] is calling for Eugene Friedman. She is concerned he may have dementia and wants to get him tested and is concerned that he may drive with this condition which could possible hurt himself or others if he gets into an accident. He already has license taken away in another country. She wants to know how that would work here in [employer].. However, she does not want any of this mentioned to [place] because it could trigger him and would like this conversation to be kept private.  Please advise pt has appt tomorrow

## 2023-10-20 ENCOUNTER — Ambulatory Visit (INDEPENDENT_AMBULATORY_CARE_PROVIDER_SITE_OTHER): Admitting: Internal Medicine

## 2023-10-20 ENCOUNTER — Encounter: Payer: Self-pay | Admitting: Internal Medicine

## 2023-10-20 VITALS — BP 138/80 | HR 80 | Temp 98.7°F | Ht 73.0 in | Wt 197.0 lb

## 2023-10-20 DIAGNOSIS — C61 Malignant neoplasm of prostate: Secondary | ICD-10-CM

## 2023-10-20 DIAGNOSIS — R053 Chronic cough: Secondary | ICD-10-CM | POA: Diagnosis not present

## 2023-10-20 DIAGNOSIS — J324 Chronic pansinusitis: Secondary | ICD-10-CM

## 2023-10-20 DIAGNOSIS — K580 Irritable bowel syndrome with diarrhea: Secondary | ICD-10-CM

## 2023-10-20 MED ORDER — RIFAXIMIN 550 MG PO TABS
550.0000 mg | ORAL_TABLET | Freq: Three times a day (TID) | ORAL | 0 refills | Status: DC
Start: 2023-10-20 — End: 2023-11-10

## 2023-10-20 MED ORDER — AMOXICILLIN-POT CLAVULANATE 875-125 MG PO TABS: 1.0000 | ORAL_TABLET | Freq: Two times a day (BID) | ORAL | 0 refills | Status: DC

## 2023-10-20 NOTE — Assessment & Plan Note (Signed)
  Irritable bowel syndrome with both constipation and diarrhea   IBS symptoms have flared with alternating diarrhea and constipation. Previously managed with dicyclomine  but I'd prefer avoid this medication(s) in 89 year old and its not working well.. Discussed potential dietary modifications with a low FODMAP diet. New antibiotic treatment for IBS was discussed, noting it may not be covered by insurance. Provide a handout on the low FODMAP diet. Prescribe the antibiotic treatment for IBS, noting potential insurance coverage issues. Advise discontinuing the current IBS medication if starting the new treatment.

## 2023-10-20 NOTE — Assessment & Plan Note (Signed)
 with lower urinary tract symptoms and prostate cancer on active surveillance   He is experiencing frequent nocturia, waking every hour to urinate. Current management includes active surveillance for prostate cancer. Discussed potential treatment options and the patient requests for a second opinion. Refer to Dr. Tanda Moats for specialized prostate cancer management, noting potential out-of-network costs.

## 2023-10-20 NOTE — Progress Notes (Signed)
 ==============================  [facility]: [phone]   -- Medical Office Visit --  Patient: Eugene Friedman      Age: 89 y.o.       Sex:  male  Date:   10/20/2023 Today's Healthcare Provider: Bernardino KANDICE Cone, MD  ==============================   Chief Complaint: Cough (Pt states has been going on for 2 weeks now with nasal congestion been taking [place] meds from another place)   Discussed the use of AI scribe software for clinical note transcription with the patient, who gave verbal consent to proceed.  History of Present Illness 89 year old male who presents with a persistent cough and sinus symptoms.  He has been experiencing a persistent cough for about two weeks, which began shortly after flying on August 21st. He has been tested for COVID-19 twice, with both results negative. He denies sinus congestion but mentions that his ears feel plugged. The cough produces yellow-green sputum, particularly during the day, but not at night. No fever, though he recalls possibly having one in the initial days of the cough. He has not taken any medications for this due to concerns about interactions with his cardiac medications.  He has a history of irritable bowel syndrome (IBS), which has been stable while in [place] but has recently flared up with alternating diarrhea and constipation since returning.  He experiences nocturia, needing to urinate every hour at night, which disrupts his sleep. He has an enlarged prostate and stage zero prostate cancer. He is currently taking Xarelto  for anticoagulation. He mentions that the cost of medications in [place] is more affordable compared to the [place] , even without insurance.   Background Reviewed: Problem List: has Hyperlipidemia; Vitamin D  deficiency; History of elevated glucose; Elevated prostate specific antigen (PSA); Labile hypertension; Prostate cancer (HCC); Spinal stenosis, lumbar; Persistent  atrial fibrillation: CHA2DS2Vasc = 3 (Age & HTN); AVM (arteriovenous malformation); Stage 3b chronic kidney disease (HCC); Aortic valve sclerosis; BPH with obstruction/lower urinary tract symptoms; Healthcare maintenance; Chronic kidney disease (CKD), active medical management without dialysis, stage 2 (mild); Dry mouth; Ambulates with cane; Irritable bowel syndrome with diarrhea; Frailty syndrome in geriatric patient; and Chronic pansinusitis on their problem list. Past Medical History:  has a past medical history of Allergic rhinitis, Atrial fibrillation by electrocardiogram (HCC) (10/2018), Cancer (HCC), DJD (degenerative joint disease), Hyperlipidemia, Hypertension, Neuromuscular disorder (HCC), Sinus bradycardia, Spinal stenosis, and Vitamin D  deficiency. Past Surgical History:   has a past surgical history that includes cervical neck mass  (07/08/2006); Appendectomy (1956); Mastoidectomy (Left, 1936); Flexible sigmoidoscopy; Hemorrhoid surgery; Medial partial knee replacement (Left, 2013); and Back Procedure (11/02/2018). Social History:   reports that he has never smoked. He has never used smokeless tobacco. He reports current alcohol use of about 10.0 standard drinks of alcohol per week. He reports that he does not use drugs. Family History:  family history includes Cancer in his maternal grandmother; Diabetes in his maternal aunt, mother, and paternal aunt; Heart attack (age of onset: 62) in his father; Hypertension in his father; Stroke in his mother. Allergies:  is allergic to celebrex [celecoxib], lipitor [atorvastatin], ppd [tuberculin purified protein derivative], adhesive [tape], and nickel.   Medication Reconciliation: Current Outpatient Medications on File Prior to Visit  Medication Sig   Cholecalciferol (VITAMIN D -3) 125 MCG (5000 UT) TABS Take 1 tablet by mouth daily at 6 (six) AM.   dicyclomine  (BENTYL ) 20 MG tablet TAKE 1 TABLET BY MOUTH 3 TO 4 TIMES PER DAY BEFORE MEALS  AND AT  BEDTIME AS NEEDED FOR NAUSEA OR CRAMPING OR BLOATING OR DIARRHEA   diltiazem  (CARDIZEM  CD) 120 MG 24 hr capsule Take  1 capsule  Daily  with Food for BP & Heart Rhythm   ezetimibe  (ZETIA ) 10 MG tablet Take 1 tablet (10 mg total) by mouth daily.   finasteride  (PROSCAR ) 5 MG tablet Take  1 tablet  Daily for Prostate   flecainide  (TAMBOCOR ) 50 MG tablet Take 1 & 1/2  tablet (75 mg)   2 x /day  (every 12 hours )        for Afib   Multiple Vitamin (MULTIVITAMIN) tablet Take 1 tablet by mouth daily.   neomycin -polymyxin-dexamethasone  (MAXITROL) 0.1 % ophthalmic suspension Apply 1 drop to eye 4 (four) times daily.   neomycin -polymyxin-hydrocortisone (CORTISPORIN) 3.5-10000-1 ophthalmic suspension 4 (four) times daily.   polyethylene glycol (MIRALAX  / GLYCOLAX ) 17 g packet Take 17 g by mouth daily.   Rivaroxaban  (XARELTO ) 15 MG TABS tablet Take  1 tablet  Daily  for Atrial Fibrillation & to Prevent Blood Clots   No current facility-administered medications on file prior to visit.  There are no discontinued medications.   Physical Exam:    10/20/2023    2:00 PM 05/31/2023    7:53 AM 04/15/2023   11:39 AM  Vitals with BMI  Height 6' 1 6' 1   Weight 197 lbs 195 lbs 10 oz   BMI 26 25.81   Systolic 138 130 859  Diastolic 80 64 66  Pulse 80 59 67  Vital signs reviewed.  Nursing notes reviewed. Weight trend reviewed. Physical Activity: Not on file   General Appearance:  No acute distress appreciable.   Well-groomed, healthy-appearing male.  Well proportioned with no abnormal fat distribution.  Good muscle tone. Pulmonary:  Normal work of breathing at rest, no respiratory distress apparent. SpO2: 95 %  Musculoskeletal: All extremities are intact.  Neurological:  Awake, alert, oriented, and engaged.  No obvious focal neurological deficits or cognitive impairments.  Sensorium seems unclouded.   Speech is clear and coherent with logical content. Psychiatric:  Appropriate mood, pleasant and cooperative  demeanor, thoughtful and engaged during the exam   Verbalized to patient: Physical Exam HEENT: Ears normal, throat normal. CHEST: Clear to auscultation bilaterally.   Results:   Verbalized to patient: Results      12/15/2022   12:25 AM 12/09/2021   10:26 PM 11/22/2021   11:02 PM 06/11/2021   10:59 AM  PHQ 2/9 Scores  PHQ - 2 Score 0 0 0 0     Office Visit on 05/31/2023  Component Date Value Ref Range Status   TSH 05/31/2023 3.290  0.450 - 4.500 uIU/mL Final   Cholesterol 05/31/2023 207 (H)  <200 mg/dL Final   HDL 95/85/7974 99  > OR = 40 mg/dL Final   Triglycerides 95/85/7974 55  <150 mg/dL Final   LDL Cholesterol (Calc) 05/31/2023 94  mg/dL (calc) Final   Total CHOL/HDL Ratio 05/31/2023 2.1  <4.9 (calc) Final   Non-HDL Cholesterol (Calc) 05/31/2023 108  <130 mg/dL (calc) Final   Sodium 95/85/7974 137  135 - 145 mEq/L Final   Potassium 05/31/2023 4.8  3.5 - 5.1 mEq/L Final   Chloride 05/31/2023 103  96 - 112 mEq/L Final   CO2 05/31/2023 26  19 - 32 mEq/L Final   Glucose, Bld 05/31/2023 90  70 - 99 mg/dL Final   BUN 95/85/7974 30 (H)  6 - 23 mg/dL Final   Creatinine, Ser 05/31/2023  1.45  0.40 - 1.50 mg/dL Final   Total Bilirubin 05/31/2023 0.5  0.2 - 1.2 mg/dL Final   Alkaline Phosphatase 05/31/2023 94  39 - 117 U/L Final   AST 05/31/2023 19  0 - 37 U/L Final   ALT 05/31/2023 21  0 - 53 U/L Final   Total Protein 05/31/2023 6.6  6.0 - 8.3 g/dL Final   Albumin 95/85/7974 4.5  3.5 - 5.2 g/dL Final   GFR 95/85/7974 41.75 (L)  >60.00 mL/min Final   Calcium  05/31/2023 9.3  8.4 - 10.5 mg/dL Final   Hgb J8r MFr Bld 05/31/2023 5.4  4.6 - 6.5 % Final   VITD 05/31/2023 79.84  30.00 - 100.00 ng/mL Final   PSA 05/31/2023 8.48 (H)  0.10 - 4.00 ng/mL Final  Office Visit on 12/15/2022  Component Date Value Ref Range Status   Color, Urine 12/15/2022 YELLOW  YELLOW Final   APPearance 12/15/2022 CLEAR  CLEAR Final   Specific Gravity, Urine 12/15/2022 1.012  1.001 - 1.035 Final    pH 12/15/2022 6.0  5.0 - 8.0 Final   Glucose, UA 12/15/2022 NEGATIVE  NEGATIVE Final   Bilirubin Urine 12/15/2022 NEGATIVE  NEGATIVE Final   Ketones, ur 12/15/2022 NEGATIVE  NEGATIVE Final   Hgb urine dipstick 12/15/2022 NEGATIVE  NEGATIVE Final   Protein, ur 12/15/2022 NEGATIVE  NEGATIVE Final   Nitrite 12/15/2022 NEGATIVE  NEGATIVE Final   Leukocytes,Ua 12/15/2022 NEGATIVE  NEGATIVE Final   Creatinine, Urine 12/15/2022 60  20 - 320 mg/dL Final   Microalb, Ur 89/70/7975 8.5  mg/dL Final   Microalb Creat Ratio 12/15/2022 142 (H)  <30 mg/g creat Final   PSA 12/15/2022 4.94 (H)  < OR = 4.00 ng/mL Final   PTH 12/15/2022 54  16 - 77 pg/mL Final   WBC 12/15/2022 5.2  3.8 - 10.8 Thousand/uL Final   RBC 12/15/2022 4.27  4.20 - 5.80 Million/uL Final   Hemoglobin 12/15/2022 13.0 (L)  13.2 - 17.1 g/dL Final   HCT 89/70/7975 39.6  38.5 - 50.0 % Final   MCV 12/15/2022 92.7  80.0 - 100.0 fL Final   MCH 12/15/2022 30.4  27.0 - 33.0 pg Final   MCHC 12/15/2022 32.8  32.0 - 36.0 g/dL Final   RDW 89/70/7975 12.8  11.0 - 15.0 % Final   Platelets 12/15/2022 237  140 - 400 Thousand/uL Final   MPV 12/15/2022 9.3  7.5 - 12.5 fL Final   Neutro Abs 12/15/2022 3,266  1,500 - 7,800 cells/uL Final   Absolute Lymphocytes 12/15/2022 1,300  850 - 3,900 cells/uL Final   Absolute Monocytes 12/15/2022 473  200 - 950 cells/uL Final   Eosinophils Absolute 12/15/2022 130  15 - 500 cells/uL Final   Basophils Absolute 12/15/2022 31  0 - 200 cells/uL Final   Neutrophils Relative % 12/15/2022 62.8  % Final   Total Lymphocyte 12/15/2022 25.0  % Final   Monocytes Relative 12/15/2022 9.1  % Final   Eosinophils Relative 12/15/2022 2.5  % Final   Basophils Relative 12/15/2022 0.6  % Final   Glucose, Bld 12/15/2022 93  65 - 99 mg/dL Final   BUN 89/70/7975 34 (H)  7 - 25 mg/dL Final   Creat 89/70/7975 1.49 (H)  0.70 - 1.22 mg/dL Final   eGFR 89/70/7975 44 (L)  > OR = 60 mL/min/1.22m2 Final   BUN/Creatinine Ratio 12/15/2022 23  (H)  6 - 22 (calc) Final   Sodium 12/15/2022 139  135 - 146 mmol/L Final   Potassium  12/15/2022 4.9  3.5 - 5.3 mmol/L Final   Chloride 12/15/2022 104  98 - 110 mmol/L Final   CO2 12/15/2022 27  20 - 32 mmol/L Final   Calcium  12/15/2022 9.5  8.6 - 10.3 mg/dL Final   Total Protein 89/70/7975 6.8  6.1 - 8.1 g/dL Final   Albumin 89/70/7975 4.3  3.6 - 5.1 g/dL Final   Globulin 89/70/7975 2.5  1.9 - 3.7 g/dL (calc) Final   AG Ratio 12/15/2022 1.7  1.0 - 2.5 (calc) Final   Total Bilirubin 12/15/2022 0.5  0.2 - 1.2 mg/dL Final   Alkaline phosphatase (APISO) 12/15/2022 85  35 - 144 U/L Final   AST 12/15/2022 17  10 - 35 U/L Final   ALT 12/15/2022 18  9 - 46 U/L Final   Magnesium 12/15/2022 2.1  1.5 - 2.5 mg/dL Final   Cholesterol 89/70/7975 203 (H)  <200 mg/dL Final   HDL 89/70/7975 92  > OR = 40 mg/dL Final   Triglycerides 89/70/7975 68  <150 mg/dL Final   LDL Cholesterol (Calc) 12/15/2022 96  mg/dL (calc) Final   Total CHOL/HDL Ratio 12/15/2022 2.2  <4.9 (calc) Final   Non-HDL Cholesterol (Calc) 12/15/2022 111  <130 mg/dL (calc) Final   TSH 89/70/7975 2.82  0.40 - 4.50 mIU/L Final   Hgb A1c MFr Bld 12/15/2022 5.4  <5.7 % of total Hgb Final   Mean Plasma Glucose 12/15/2022 108  mg/dL Final   eAG (mmol/L) 89/70/7975 6.0  mmol/L Final   Insulin  12/15/2022 11.3  uIU/mL Final  Office Visit on 06/12/2022  Component Date Value Ref Range Status   WBC 06/12/2022 5.5  3.8 - 10.8 Thousand/uL Final   RBC 06/12/2022 4.18 (L)  4.20 - 5.80 Million/uL Final   Hemoglobin 06/12/2022 12.8 (L)  13.2 - 17.1 g/dL Final   HCT 95/73/7975 38.2 (L)  38.5 - 50.0 % Final   MCV 06/12/2022 91.4  80.0 - 100.0 fL Final   MCH 06/12/2022 30.6  27.0 - 33.0 pg Final   MCHC 06/12/2022 33.5  32.0 - 36.0 g/dL Final   RDW 95/73/7975 13.2  11.0 - 15.0 % Final   Platelets 06/12/2022 232  140 - 400 Thousand/uL Final   MPV 06/12/2022 9.3  7.5 - 12.5 fL Final   Neutro Abs 06/12/2022 3,542  1,500 - 7,800 cells/uL Final   Lymphs  Abs 06/12/2022 1,183  850 - 3,900 cells/uL Final   Absolute Monocytes 06/12/2022 567  200 - 950 cells/uL Final   Eosinophils Absolute 06/12/2022 160  15 - 500 cells/uL Final   Basophils Absolute 06/12/2022 50  0 - 200 cells/uL Final   Neutrophils Relative % 06/12/2022 64.4  % Final   Total Lymphocyte 06/12/2022 21.5  % Final   Monocytes Relative 06/12/2022 10.3  % Final   Eosinophils Relative 06/12/2022 2.9  % Final   Basophils Relative 06/12/2022 0.9  % Final   Glucose, Bld 06/12/2022 94  65 - 99 mg/dL Final   BUN 95/73/7975 27 (H)  7 - 25 mg/dL Final   Creat 95/73/7975 1.29 (H)  0.70 - 1.22 mg/dL Final   eGFR 95/73/7975 52 (L)  > OR = 60 mL/min/1.70m2 Final   BUN/Creatinine Ratio 06/12/2022 21  6 - 22 (calc) Final   Sodium 06/12/2022 136  135 - 146 mmol/L Final   Potassium 06/12/2022 4.7  3.5 - 5.3 mmol/L Final   Chloride 06/12/2022 103  98 - 110 mmol/L Final   CO2 06/12/2022 27  20 - 32 mmol/L  Final   Calcium  06/12/2022 9.2  8.6 - 10.3 mg/dL Final   Total Protein 95/73/7975 6.3  6.1 - 8.1 g/dL Final   Albumin 95/73/7975 4.3  3.6 - 5.1 g/dL Final   Globulin 95/73/7975 2.0  1.9 - 3.7 g/dL (calc) Final   AG Ratio 06/12/2022 2.2  1.0 - 2.5 (calc) Final   Total Bilirubin 06/12/2022 0.6  0.2 - 1.2 mg/dL Final   Alkaline phosphatase (APISO) 06/12/2022 89  35 - 144 U/L Final   AST 06/12/2022 20  10 - 35 U/L Final   ALT 06/12/2022 21  9 - 46 U/L Final   Cholesterol 06/12/2022 190  <200 mg/dL Final   HDL 95/73/7975 93  > OR = 40 mg/dL Final   Triglycerides 95/73/7975 95  <150 mg/dL Final   LDL Cholesterol (Calc) 06/12/2022 79  mg/dL (calc) Final   Total CHOL/HDL Ratio 06/12/2022 2.0  <4.9 (calc) Final   Non-HDL Cholesterol (Calc) 06/12/2022 97  <130 mg/dL (calc) Final   Hgb J8r MFr Bld 06/12/2022 5.4  <5.7 % of total Hgb Final   Mean Plasma Glucose 06/12/2022 108  mg/dL Final   eAG (mmol/L) 95/73/7975 6.0  mmol/L Final   Vit D, 25-Hydroxy 06/12/2022 77  30 - 100 ng/mL Final  Office  Visit on 02/03/2022  Component Date Value Ref Range Status   WBC 02/03/2022 5.7  3.8 - 10.8 Thousand/uL Final   RBC 02/03/2022 4.25  4.20 - 5.80 Million/uL Final   Hemoglobin 02/03/2022 13.2  13.2 - 17.1 g/dL Final   HCT 87/80/7976 38.8  38.5 - 50.0 % Final   MCV 02/03/2022 91.3  80.0 - 100.0 fL Final   MCH 02/03/2022 31.1  27.0 - 33.0 pg Final   MCHC 02/03/2022 34.0  32.0 - 36.0 g/dL Final   RDW 87/80/7976 13.3  11.0 - 15.0 % Final   Platelets 02/03/2022 251  140 - 400 Thousand/uL Final   MPV 02/03/2022 9.4  7.5 - 12.5 fL Final   Neutro Abs 02/03/2022 3,620  1,500 - 7,800 cells/uL Final   Lymphs Abs 02/03/2022 1,311  850 - 3,900 cells/uL Final   Absolute Monocytes 02/03/2022 599  200 - 950 cells/uL Final   Eosinophils Absolute 02/03/2022 131  15 - 500 cells/uL Final   Basophils Absolute 02/03/2022 40  0 - 200 cells/uL Final   Neutrophils Relative % 02/03/2022 63.5  % Final   Total Lymphocyte 02/03/2022 23.0  % Final   Monocytes Relative 02/03/2022 10.5  % Final   Eosinophils Relative 02/03/2022 2.3  % Final   Basophils Relative 02/03/2022 0.7  % Final   Glucose, Bld 02/03/2022 96  65 - 99 mg/dL Final   BUN 87/80/7976 29 (H)  7 - 25 mg/dL Final   Creat 87/80/7976 1.56 (H)  0.70 - 1.22 mg/dL Final   eGFR 87/80/7976 42 (L)  > OR = 60 mL/min/1.6m2 Final   BUN/Creatinine Ratio 02/03/2022 19  6 - 22 (calc) Final   Sodium 02/03/2022 136  135 - 146 mmol/L Final   Potassium 02/03/2022 4.8  3.5 - 5.3 mmol/L Final   Chloride 02/03/2022 104  98 - 110 mmol/L Final   CO2 02/03/2022 26  20 - 32 mmol/L Final   Calcium  02/03/2022 9.1  8.6 - 10.3 mg/dL Final   Total Protein 87/80/7976 6.6  6.1 - 8.1 g/dL Final   Albumin 87/80/7976 4.0  3.6 - 5.1 g/dL Final   Globulin 87/80/7976 2.6  1.9 - 3.7 g/dL (calc) Final   AG  Ratio 02/03/2022 1.5  1.0 - 2.5 (calc) Final   Total Bilirubin 02/03/2022 0.5  0.2 - 1.2 mg/dL Final   Alkaline phosphatase (APISO) 02/03/2022 89  35 - 144 U/L Final   AST 02/03/2022  18  10 - 35 U/L Final   ALT 02/03/2022 19  9 - 46 U/L Final   Magnesium 02/03/2022 2.2  1.5 - 2.5 mg/dL Final   TSH 87/80/7976 3.80  0.40 - 4.50 mIU/L Final  Admission on 01/30/2022, Discharged on 01/30/2022  Component Date Value Ref Range Status   WBC 01/30/2022 6.2  4.0 - 10.5 K/uL Final   RBC 01/30/2022 4.07 (L)  4.22 - 5.81 MIL/uL Final   Hemoglobin 01/30/2022 12.5 (L)  13.0 - 17.0 g/dL Final   HCT 87/84/7976 37.6 (L)  39.0 - 52.0 % Final   MCV 01/30/2022 92.4  80.0 - 100.0 fL Final   MCH 01/30/2022 30.7  26.0 - 34.0 pg Final   MCHC 01/30/2022 33.2  30.0 - 36.0 g/dL Final   RDW 87/84/7976 14.3  11.5 - 15.5 % Final   Platelets 01/30/2022 219  150 - 400 K/uL Final   nRBC 01/30/2022 0.0  0.0 - 0.2 % Final   Neutrophils Relative % 01/30/2022 74  % Final   Neutro Abs 01/30/2022 4.6  1.7 - 7.7 K/uL Final   Lymphocytes Relative 01/30/2022 14  % Final   Lymphs Abs 01/30/2022 0.9  0.7 - 4.0 K/uL Final   Monocytes Relative 01/30/2022 10  % Final   Monocytes Absolute 01/30/2022 0.6  0.1 - 1.0 K/uL Final   Eosinophils Relative 01/30/2022 1  % Final   Eosinophils Absolute 01/30/2022 0.1  0.0 - 0.5 K/uL Final   Basophils Relative 01/30/2022 1  % Final   Basophils Absolute 01/30/2022 0.0  0.0 - 0.1 K/uL Final   Immature Granulocytes 01/30/2022 0  % Final   Abs Immature Granulocytes 01/30/2022 0.02  0.00 - 0.07 K/uL Final   Sodium 01/30/2022 133 (L)  135 - 145 mmol/L Final   Potassium 01/30/2022 4.9  3.5 - 5.1 mmol/L Final   Chloride 01/30/2022 100  98 - 111 mmol/L Final   CO2 01/30/2022 24  22 - 32 mmol/L Final   Glucose, Bld 01/30/2022 103 (H)  70 - 99 mg/dL Final   BUN 87/84/7976 28 (H)  8 - 23 mg/dL Final   Creatinine, Ser 01/30/2022 1.70 (H)  0.61 - 1.24 mg/dL Final   Calcium  01/30/2022 8.9  8.9 - 10.3 mg/dL Final   Total Protein 87/84/7976 6.7  6.5 - 8.1 g/dL Final   Albumin 87/84/7976 4.0  3.5 - 5.0 g/dL Final   AST 87/84/7976 21  15 - 41 U/L Final   ALT 01/30/2022 19  0 - 44 U/L  Final   Alkaline Phosphatase 01/30/2022 81  38 - 126 U/L Final   Total Bilirubin 01/30/2022 0.6  0.3 - 1.2 mg/dL Final   GFR, Estimated 01/30/2022 38 (L)  >60 mL/min Final   Anion gap 01/30/2022 9  5 - 15 Final   Glucose-Capillary 01/30/2022 104 (H)  70 - 99 mg/dL Final   Color, Urine 87/84/7976 YELLOW  YELLOW Final   APPearance 01/30/2022 CLEAR  CLEAR Final   Specific Gravity, Urine 01/30/2022 1.005  1.005 - 1.030 Final   pH 01/30/2022 6.0  5.0 - 8.0 Final   Glucose, UA 01/30/2022 NEGATIVE  NEGATIVE mg/dL Final   Hgb urine dipstick 01/30/2022 NEGATIVE  NEGATIVE Final   Bilirubin Urine 01/30/2022 NEGATIVE  NEGATIVE Final  Ketones, ur 01/30/2022 NEGATIVE  NEGATIVE mg/dL Final   Protein, ur 87/84/7976 NEGATIVE  NEGATIVE mg/dL Final   Nitrite 87/84/7976 NEGATIVE  NEGATIVE Final   Leukocytes,Ua 01/30/2022 NEGATIVE  NEGATIVE Final  Office Visit on 12/09/2021  Component Date Value Ref Range Status   Color, Urine 12/09/2021 YELLOW  YELLOW Final   APPearance 12/09/2021 CLEAR  CLEAR Final   Specific Gravity, Urine 12/09/2021 1.013  1.001 - 1.035 Final   pH 12/09/2021 5.5  5.0 - 8.0 Final   Glucose, UA 12/09/2021 NEGATIVE  NEGATIVE Final   Bilirubin Urine 12/09/2021 NEGATIVE  NEGATIVE Final   Ketones, ur 12/09/2021 NEGATIVE  NEGATIVE Final   Hgb urine dipstick 12/09/2021 NEGATIVE  NEGATIVE Final   Protein, ur 12/09/2021 NEGATIVE  NEGATIVE Final   Nitrite 12/09/2021 NEGATIVE  NEGATIVE Final   Leukocytes,Ua 12/09/2021 NEGATIVE  NEGATIVE Final   Creatinine, Urine 12/09/2021 85  20 - 320 mg/dL Final   Microalb, Ur 89/75/7976 4.4  mg/dL Final   Microalb Creat Ratio 12/09/2021 52 (H)  <30 mcg/mg creat Final   PSA 12/09/2021 6.04 (H)  < OR = 4.00 ng/mL Final   PTH 12/09/2021 53  16 - 77 pg/mL Final   Calcium  12/09/2021 9.2  8.6 - 10.3 mg/dL Final   WBC 89/75/7976 5.4  3.8 - 10.8 Thousand/uL Final   RBC 12/09/2021 4.28  4.20 - 5.80 Million/uL Final   Hemoglobin 12/09/2021 13.1 (L)  13.2 - 17.1  g/dL Final   HCT 89/75/7976 39.1  38.5 - 50.0 % Final   MCV 12/09/2021 91.4  80.0 - 100.0 fL Final   MCH 12/09/2021 30.6  27.0 - 33.0 pg Final   MCHC 12/09/2021 33.5  32.0 - 36.0 g/dL Final   RDW 89/75/7976 13.0  11.0 - 15.0 % Final   Platelets 12/09/2021 268  140 - 400 Thousand/uL Final   MPV 12/09/2021 9.2  7.5 - 12.5 fL Final   Neutro Abs 12/09/2021 3,461  1,500 - 7,800 cells/uL Final   Lymphs Abs 12/09/2021 1,301  850 - 3,900 cells/uL Final   Absolute Monocytes 12/09/2021 475  200 - 950 cells/uL Final   Eosinophils Absolute 12/09/2021 130  15 - 500 cells/uL Final   Basophils Absolute 12/09/2021 32  0 - 200 cells/uL Final   Neutrophils Relative % 12/09/2021 64.1  % Final   Total Lymphocyte 12/09/2021 24.1  % Final   Monocytes Relative 12/09/2021 8.8  % Final   Eosinophils Relative 12/09/2021 2.4  % Final   Basophils Relative 12/09/2021 0.6  % Final   Glucose, Bld 12/09/2021 87  65 - 99 mg/dL Final   BUN 89/75/7976 26 (H)  7 - 25 mg/dL Final   Creat 89/75/7976 1.54 (H)  0.70 - 1.22 mg/dL Final   eGFR 89/75/7976 42 (L)  > OR = 60 mL/min/1.57m2 Final   BUN/Creatinine Ratio 12/09/2021 17  6 - 22 (calc) Final   Sodium 12/09/2021 138  135 - 146 mmol/L Final   Potassium 12/09/2021 5.0  3.5 - 5.3 mmol/L Final   Chloride 12/09/2021 104  98 - 110 mmol/L Final   CO2 12/09/2021 24  20 - 32 mmol/L Final   Calcium  12/09/2021 9.2  8.6 - 10.3 mg/dL Final   Total Protein 89/75/7976 6.9  6.1 - 8.1 g/dL Final   Albumin 89/75/7976 4.4  3.6 - 5.1 g/dL Final   Globulin 89/75/7976 2.5  1.9 - 3.7 g/dL (calc) Final   AG Ratio 12/09/2021 1.8  1.0 - 2.5 (calc) Final   Total  Bilirubin 12/09/2021 0.5  0.2 - 1.2 mg/dL Final   Alkaline phosphatase (APISO) 12/09/2021 102  35 - 144 U/L Final   AST 12/09/2021 19  10 - 35 U/L Final   ALT 12/09/2021 19  9 - 46 U/L Final   Magnesium 12/09/2021 2.1  1.5 - 2.5 mg/dL Final   Cholesterol 89/75/7976 230 (H)  <200 mg/dL Final   HDL 89/75/7976 89  > OR = 40 mg/dL Final    Triglycerides 12/09/2021 77  <150 mg/dL Final   LDL Cholesterol (Calc) 12/09/2021 124 (H)  mg/dL (calc) Final   Total CHOL/HDL Ratio 12/09/2021 2.6  <4.9 (calc) Final   Non-HDL Cholesterol (Calc) 12/09/2021 141 (H)  <130 mg/dL (calc) Final   TSH 89/75/7976 3.17  0.40 - 4.50 mIU/L Final   Hgb A1c MFr Bld 12/09/2021 5.4  <5.7 % of total Hgb Final   Mean Plasma Glucose 12/09/2021 108  mg/dL Final   eAG (mmol/L) 89/75/7976 6.0  mmol/L Final   Insulin  12/09/2021 12.5  uIU/mL Final   Vit D, 25-Hydroxy 12/09/2021 60  30 - 100 ng/mL Final  No image results found. No results found.       ASSESSMENT & PLAN   Assessment & Plan Chronic pansinusitis Chronic cough Chronic pansinusitis with acute exacerbation   He has been coughing for two weeks with yellow-green sputum and postnasal drip. Lungs are clear. The differential includes sinus infection with postnasal drip versus allergies, but it is likely a sinus infection due to allergies, exacerbated by recent travel and pressure changes. Prescribe Augmentin  for the sinus infection. Recommend Flonase for nasal congestion and advise regular use of saline nasal spray, especially after returning indoors. Irritable bowel syndrome with diarrhea  Irritable bowel syndrome with both constipation and diarrhea   IBS symptoms have flared with alternating diarrhea and constipation. Previously managed with dicyclomine  but I'd prefer avoid this medication(s) in 89 year old and its not working well.. Discussed potential dietary modifications with a low FODMAP diet. New antibiotic treatment for IBS was discussed, noting it may not be covered by insurance. Provide a handout on the low FODMAP diet. Prescribe the antibiotic treatment for IBS, noting potential insurance coverage issues. Advise discontinuing the current IBS medication if starting the new treatment. Prostate cancer [facility]) with lower urinary tract symptoms and prostate cancer on active surveillance   He is  experiencing frequent nocturia, waking every hour to urinate. Current management includes active surveillance for prostate cancer. Discussed potential treatment options and the patient requests for a second opinion. Refer to Dr. Tanda Moats for specialized prostate cancer management, noting potential out-of-network costs.  ORDER ASSOCIATIONS  #   DIAGNOSIS / CONDITION ICD-10 ENCOUNTER ORDER     ICD-10-CM   1. Chronic pansinusitis  J32.4 amoxicillin -clavulanate (AUGMENTIN ) 875-125 MG tablet    2. Chronic cough  R05.3 amoxicillin -clavulanate (AUGMENTIN ) 875-125 MG tablet    3. Irritable bowel syndrome with diarrhea  K58.0 rifaximin  (XIFAXAN ) 550 MG TABS tablet    4. Prostate cancer [facility])  C61 Ambulatory referral to Urology         Orders Placed in Encounter:   Referral Orders         Ambulatory referral to Urology     Meds ordered this encounter  Medications   amoxicillin -clavulanate (AUGMENTIN ) 875-125 MG tablet    Sig: Take 1 tablet by mouth 2 (two) times daily.    Dispense:  20 tablet    Refill:  0   rifaximin  (XIFAXAN ) 550 MG TABS tablet  Sig: Take 1 tablet (550 mg total) by mouth 3 (three) times daily.    Dispense:  42 tablet    Refill:  0      This document was synthesized by artificial intelligence ([employer]bridge) using HIPAA-compliant recording of the clinical interaction;   We discussed the use of AI scribe software for clinical note transcription with the patient, who gave verbal consent to proceed. additional Info: This encounter employed state-of-the-art, real-time, collaborative documentation. The patient actively reviewed and assisted in updating their electronic medical record on a shared screen, ensuring transparency and facilitating joint problem-solving for the problem list, overview, and plan. This approach promotes accurate, informed care. The treatment plan was discussed and reviewed in detail, including medication safety, potential side effects, and all patient  questions. We confirmed understanding and comfort with the plan. Follow-up instructions were established, including contacting the office for any concerns, returning if symptoms worsen, persist, or new symptoms develop, and precautions for potential emergency department visits.

## 2023-10-20 NOTE — Patient Instructions (Addendum)
 It was a pleasure seeing you today! Your health and satisfaction are our top priorities.  Eugene Cone, MD  VISIT SUMMARY: Today, we discussed your persistent cough and sinus symptoms, your irritable bowel syndrome (IBS), and your urinary symptoms related to an enlarged prostate and prostate cancer. We reviewed your symptoms, potential causes, and treatment options for each issue.  YOUR PLAN: -CHRONIC PANSINUSITIS WITH ACUTE EXACERBATION: You have a sinus infection likely caused by allergies and recent travel. This is causing your persistent cough and yellow-green sputum. We have prescribed Augmentin  to treat the infection and recommended using Flonase for nasal congestion and saline nasal spray regularly.  -IRRITABLE BOWEL SYNDROME (IBS) WITH BOTH CONSTIPATION AND DIARRHEA: Your IBS has flared up, causing alternating diarrhea and constipation. We discussed dietary changes, specifically a low FODMAP diet, and provided a handout. We also prescribed a new antibiotic treatment for IBS, but please check with your insurance about coverage. If you start the new treatment, stop your current IBS medication.  -BENIGN PROSTATIC HYPERPLASIA WITH LOWER URINARY TRACT SYMPTOMS AND PROSTATE CANCER ON ACTIVE SURVEILLANCE: You are experiencing frequent nighttime urination due to an enlarged prostate. We discussed your current management plan and the need for a second opinion. We have referred you to Dr. Tanda Moats for specialized prostate cancer management.  INSTRUCTIONS: Please follow up with Dr. Tanda Moats for specialized prostate cancer management. Check with your insurance about coverage for the new IBS antibiotic treatment. Use Augmentin  as prescribed for your sinus infection and follow the recommendations for nasal congestion relief. Review the low FODMAP diet handout and consider making dietary changes to manage your IBS symptoms.  Your Providers PCP: Friedman Eugene MATSU, MD,  [phone]) Referring  Provider: Cone Eugene MATSU, MD,  [phone]) Care Team Provider: Camillo Golas, MD,  [phone]) Care Team Provider: Nahser, Aleene PARAS, MD Care Team Provider: Eletha Boas, MD,  [phone]) Care Team Provider: Devere Lonni Righter, MD,  [phone]) Care Team Provider: Lavella Evalene Dover, MD,  [phone])  NEXT STEPS: [x]  Early Intervention: Schedule sooner appointment, call our on-call services, or go to emergency room if there is any significant Increase in pain or discomfort New or worsening symptoms Sudden or severe changes in your health [x]  Flexible Follow-Up: We recommend a No follow-ups on file. for optimal routine care. This allows for progress monitoring and treatment adjustments. [x]  Preventive Care: Schedule your annual preventive care visit! It's typically covered by insurance and helps identify potential health issues early. [x]  Lab & X-ray Appointments: Incomplete tests scheduled today, or call to schedule. X-rays: [facility] (M-F, 8:30am-noon or 1pm-5pm). [x]  Medical Information Release: Sign a release form at front desk to obtain relevant medical information we don't have.  MAKING THE MOST OF OUR FOCUSED 20 MINUTE APPOINTMENTS: [x]   Clearly state your top concerns at the beginning of the visit to focus our discussion [x]   If you anticipate you will need more time, please inform the front desk during scheduling - we can book multiple appointments in the same week. [x]   If you have transportation problems- use our convenient video appointments or ask about transportation support. [x]   We can get down to business faster if you use MyChart to update information before the visit and submit non-urgent questions before your visit. Thank you for taking the time to provide details through MyChart.  Let our nurse know and she can import this information into your encounter documents.  Arrival and Wait Times: [x]   Arriving on time ensures that  everyone receives prompt attention. [x]   Early morning (8a) and afternoon (1p) appointments tend to have shortest wait times. [x]   Unfortunately, we cannot delay appointments for late arrivals or hold slots during phone calls.  Getting Answers and Following Up [x]   Simple Questions & Concerns: For quick questions or basic follow-up after your visit, reach us  at [phone] or MyChart messaging. [x]   Complex Concerns: If your concern is more complex, scheduling an appointment might be best. Discuss this with the staff to find the most suitable option. [x]   Lab & Imaging Results: We'll contact you directly if results are abnormal or you don't use MyChart. Most normal results will be on MyChart within 2-3 business days, with a review message from Dr. Jesus. Haven't heard back in 2 weeks? Need results sooner? Contact us  at [phone]. [x]   Referrals: Our referral coordinator will manage specialist referrals. The specialist's office should contact you within 2 weeks to schedule an appointment. Call us  if you haven't heard from them after 2 weeks.  Staying Connected [x]   MyChart: Activate your MyChart for the fastest way to access results and message us . See the last page of this paperwork for instructions on how to activate.  Bring to Your Next Appointment [x]   Medications: Please bring all your medication bottles to your next appointment to ensure we have an accurate record of your prescriptions. [x]   Health Diaries: If you're monitoring any health conditions at home, keeping a diary of your readings can be very helpful for discussions at your next appointment.  Billing [x]   X-ray & Lab Orders: These are billed by separate companies. Contact the invoicing company directly for questions or concerns. [x]   Visit Charges: Discuss any billing inquiries with our administrative services team.  Your Satisfaction Matters [x]   Share Your Experience: We strive for your satisfaction! If you have any  complaints, or preferably compliments, please let Dr. Jesus know directly or contact our Practice Administrators, Manuelita Rubin or [employer], by asking at the front desk.   Reviewing Your Records [x]   Review this early draft of your clinical encounter notes below and the final encounter summary tomorrow on MyChart after its been completed.  All orders placed so far are visible here: Chronic pansinusitis -     Amoxicillin -Pot Clavulanate; Take 1 tablet by mouth 2 (two) times daily.  Dispense: 20 tablet; Refill: 0  Chronic cough -     Amoxicillin -Pot Clavulanate; Take 1 tablet by mouth 2 (two) times daily.  Dispense: 20 tablet; Refill: 0  Irritable bowel syndrome with diarrhea -     rifAXIMin ; Take 1 tablet (550 mg total) by mouth 3 (three) times daily.  Dispense: 42 tablet; Refill: 0  Prostate cancer (HCC) -     Ambulatory referral to Urology         ALLERGY MANAGEMENT PLAN  This plan is designed to help manage your allergic rhinitis (nasal allergies) effectively. Follow these steps daily for best results.  Sinus saline sprays- use nightly, and after sneezing episodes or exposure to allergen.  Insert deeply and spray mist into nose while leaning over sink at 45 degrees,  while gently breathing. Also blow out onto tissue while leaning forward 45 degrees. Once daily, after a sinus rinse, use sensimist.  Just before bedtime is best. This only needed if allergies acting up.  If this is inadequate add-on once daily for levocetirizine / xyzal 5 mg for nondrowsy antihistamine Take benadryl 25 mg at bedtime also if allergic mucus is  persisting  When allergies cause chronic swelling in sinuses, it leads to sinus infections:    DAILY TREATMENT ROUTINE   Time of Day Treatment Steps  Morning 1. Saline Nasal Spray - Use to cleanse nasal passages 2. Xyzal (levocetirizine) - Take one tablet daily   Throughout Day Saline Nasal Spray - Use 2 additional times (mid-day and afternoon)    Evening/Bedtime 1. Saline Nasal Rinse - Thoroughly clean nasal passages 2. Flonase Sensimist - Apply after nasal rinse 3. Benadryl (diphenhydramine) - Take 25mg  if experiencing persistent congestion    PROPER TECHNIQUE GUIDE       Saline Nasal Spray/Rinse Technique: Lean forward over sink at a 45-degree angle Turn head slightly to one side Insert spray tip into upper nostril Spray gently while breathing lightly through your nose Repeat on other side Gently blow nose to clear excess solution Use saline spray 3 times daily to keep nasal passages moist and clear allergens.       Flonase Sensimist Technique: Shake bottle gently before each use Prime the bottle if it's new or hasn't been used for a week Tilt your head forward slightly Insert tip into nostril, pointing away from the center of your nose Spray while inhaling gently Repeat in other nostril Use Flonase Sensimist once daily, preferably at bedtime after using saline rinse. It may take several days of regular use to feel maximum benefit.   WHY FLONASE SENSIMIST?   Benefits of Flonase Sensimist:  Alcohol-free and scent-free formula - gentler on sensitive nasal passages Fine mist application - more comfortable with less dripping down throat Effectively relieves nasal congestion, sneezing, runny nose, and even eye symptoms 24-hour relief with once-daily dosing Uses a more potent form of fluticasone that works at a lower dose Less liquid per spray means less discomfort  UNDERSTANDING YOUR MEDICATIONS   Medication How It Works Important Notes  Flonase Sensimist (fluticasone furoate) Reduces inflammation in nasal passages, addressing the underlying cause of allergy symptoms - Takes several days for full effect - Use daily for best results - Safe for long-term use   Xyzal (levocetirizine) Blocks histamine to reduce allergy symptoms like sneezing and itching - Take at the same time each day - May cause drowsiness in some  people - Once-daily dosing   Benadryl (diphenhydramine) Antihistamine that provides additional relief for breakthrough symptoms - Causes drowsiness - Use only at bedtime - For occasional use when needed   Saline Spray/Rinse Physically removes allergens and moistens nasal passages - Safe to use frequently - Improves effectiveness of other treatments - Reduces nasal irritation    CONTACT YOUR PROVIDER IF: Your symptoms do not improve after 1-2 weeks of following this plan You develop sinus pain with fever or green/yellow discharge You experience frequent nosebleeds You develop new or worsening symptoms You have questions about your treatment plan     ADDITIONAL ALLERGY MANAGEMENT TIPS   HELPFUL STRATEGIES: ?? Keep windows closed during high pollen seasons ??? Use allergen-proof covers for pillows and mattresses ?? Vacuum regularly with a HEPA filter vacuum ?? Shower and change clothes after spending time outdoors ?? Check local pollen counts and limit outdoor time when counts are high ?? Stay well-hydrated to help keep mucous membranes moist       FODMAP stands for fermentable oligodi-monosaccharides and polyols, or, more simply, certain types of carbohydrates found in foods that are hard to digest. By following FODMAP, also known as a low-FODMAP diet, you avoid or limit these particular carbohydrates. Some of the foods  that contain FODMAPs include: - Fruits such as apples, apricots, blackberries, cherries, mango, nectarines, pears, plums, and watermelon, or juice containing any of these fruits - Canned fruit in natural fruit juice, or large quantities of fruit juice or dried fruit - Vegentables such as artichokes, asparagus, beans, cabbage, cauliflower, garlic and garlic salts, lentils, mushrooms, onions, and sugar snap or snow peas - Dairy products such as milk, milk products, soft cheese, yogurt, custard, and ice cream - Wheat and rye products - Honey and foods with  high-fructose corn syrup  - Products, including candy and gum, with sweeteners ending in -ol (for example, sorbitol, mannitol, xylitol, and maltitol)   My favorite websites for more information is: DiseaseAlerts.se stands for fermentable oligodi-monosaccharides and polyols, or, more simply, certain types of carbohydrates found in foods that are hard to digest. By following FODMAP, also known as a low-FODMAP diet, you avoid or limit these particular carbohydrates. Some of the foods that contain FODMAPs include: - Fruits such as apples, apricots, blackberries, cherries, mango, nectarines, pears, plums, and watermelon, or juice containing any of these fruits - Canned fruit in natural fruit juice, or large quantities of fruit juice or dried fruit - Vegentables such as artichokes, asparagus, beans, cabbage, cauliflower, garlic and garlic salts, lentils, mushrooms, onions, and sugar snap or snow peas - Dairy products such as milk, milk products, soft cheese, yogurt, custard, and ice cream - Wheat and rye products - Honey and foods with high-fructose corn syrup  - Products, including candy and gum, with sweeteners ending in -ol (for example, sorbitol, mannitol, xylitol, and maltitol)   My favorite websites for more information is: DiseaseAlerts.se stands for fermentable oligodi-monosaccharides and polyols, or, more simply, certain types of carbohydrates found in foods that are hard to digest. By following FODMAP, also known as a low-FODMAP diet, you avoid or limit these particular carbohydrates. Some of the foods that contain FODMAPs include: - Fruits such as apples, apricots, blackberries, cherries, mango, nectarines, pears, plums, and watermelon, or juice containing any of these fruits - Canned fruit in natural fruit juice, or large quantities of fruit juice or dried fruit - Vegentables such as artichokes, asparagus, beans, cabbage, cauliflower, garlic and  garlic salts, lentils, mushrooms, onions, and sugar snap or snow peas - Dairy products such as milk, milk products, soft cheese, yogurt, custard, and ice cream - Wheat and rye products - Honey and foods with high-fructose corn syrup  - Products, including candy and gum, with sweeteners ending in -ol (for example, sorbitol, mannitol, xylitol, and maltitol)   My favorite websites for more information is: FindScifi.com.ee

## 2023-10-20 NOTE — Assessment & Plan Note (Signed)
 Chronic pansinusitis with acute exacerbation   He has been coughing for two weeks with yellow-green sputum and postnasal drip. Lungs are clear. The differential includes sinus infection with postnasal drip versus allergies, but it is likely a sinus infection due to allergies, exacerbated by recent travel and pressure changes. Prescribe Augmentin  for the sinus infection. Recommend Flonase for nasal congestion and advise regular use of saline nasal spray, especially after returning indoors.

## 2023-10-21 ENCOUNTER — Telehealth: Payer: Self-pay

## 2023-10-21 ENCOUNTER — Other Ambulatory Visit (HOSPITAL_COMMUNITY): Payer: Self-pay

## 2023-10-21 NOTE — Telephone Encounter (Signed)
 Copied from CRM [id number]. Topic: Clinical - Medication Prior Auth >> Oct 21, 2023  2:30 PM Lauren C wrote: Reason for CRM: Therisa, daughter on dpr, calling to request prior auth for either  Amoxicillin -Pot Clavulanate 875-125 MG of rifAXIMin  550 mg. [facility] stated they needed a PA for a medication and that they would request it from the provider. Therisa is not certain which med needs the PA. I do not see a PA request from [facility] anywhere, but I am unsure if that would show in chart. Ey#[id number] for any questions for Therisa.  Called pt back and spoke with pt and his daughter randall about the medication that needs a PA it is Rifaximin  I sent message to the PA team to start a Pa for this medication .

## 2023-10-21 NOTE — Telephone Encounter (Signed)
 Pharmacy Patient Advocate Encounter   Received notification from Pt Calls Messages that prior authorization for Xifaxan  550MG  tablets is required/requested.   Insurance verification completed.   The patient is insured through [employer] .   Per test claim: PA required; PA submitted to above mentioned insurance via Latent Key/confirmation #/EOC [id number] Status is pending

## 2023-10-21 NOTE — Telephone Encounter (Signed)
 Pharmacy Patient Advocate Encounter  Received notification from [employer] that Prior Authorization for Xifaxan  550MG  tablets  has been APPROVED from 09/21/23 to 11/04/23   PA #/Case ID/Reference #: [id number]

## 2023-11-10 ENCOUNTER — Other Ambulatory Visit: Payer: Self-pay | Admitting: Internal Medicine

## 2023-11-10 ENCOUNTER — Encounter: Payer: Self-pay | Admitting: Internal Medicine

## 2023-11-10 ENCOUNTER — Ambulatory Visit (INDEPENDENT_AMBULATORY_CARE_PROVIDER_SITE_OTHER): Admitting: Internal Medicine

## 2023-11-10 VITALS — BP 132/72 | HR 76 | Temp 98.0°F | Ht 73.0 in | Wt 195.4 lb

## 2023-11-10 DIAGNOSIS — R140 Abdominal distension (gaseous): Secondary | ICD-10-CM | POA: Diagnosis not present

## 2023-11-10 DIAGNOSIS — R159 Full incontinence of feces: Secondary | ICD-10-CM | POA: Diagnosis not present

## 2023-11-10 DIAGNOSIS — K9289 Other specified diseases of the digestive system: Secondary | ICD-10-CM | POA: Diagnosis not present

## 2023-11-10 DIAGNOSIS — K580 Irritable bowel syndrome with diarrhea: Secondary | ICD-10-CM

## 2023-11-10 DIAGNOSIS — R152 Fecal urgency: Secondary | ICD-10-CM

## 2023-11-10 DIAGNOSIS — H9190 Unspecified hearing loss, unspecified ear: Secondary | ICD-10-CM | POA: Insufficient documentation

## 2023-11-10 DIAGNOSIS — N1832 Chronic kidney disease, stage 3b: Secondary | ICD-10-CM

## 2023-11-10 DIAGNOSIS — N401 Enlarged prostate with lower urinary tract symptoms: Secondary | ICD-10-CM

## 2023-11-10 DIAGNOSIS — Z23 Encounter for immunization: Secondary | ICD-10-CM | POA: Diagnosis not present

## 2023-11-10 MED ORDER — TAMSULOSIN HCL 0.4 MG PO CAPS
0.4000 mg | ORAL_CAPSULE | Freq: Every day | ORAL | 3 refills | Status: AC
Start: 2023-11-10 — End: ?

## 2023-11-10 MED ORDER — RIFAXIMIN 550 MG PO TABS: 550.0000 mg | ORAL_TABLET | Freq: Three times a day (TID) | ORAL | 0 refills | Status: DC

## 2023-11-10 MED ORDER — DUTASTERIDE 0.5 MG PO CAPS: 0.5000 mg | ORAL_CAPSULE | Freq: Every day | ORAL | 1 refills | Status: DC

## 2023-11-10 NOTE — Assessment & Plan Note (Signed)
 Irritable bowel syndrome with diarrhea, possible celiac disease, and suspected small intestinal bacterial overgrowth   Chronic IBS with diarrhea, significant gas, and bloating. Possible celiac disease and suspected SIBO due to response to antibiotics. Provide handouts for FODMAP and gluten-free diets. Instruct to adhere strictly to each diet for 1-2 weeks to identify triggers. Extend Augmentin  for another 14 days. If insurance allows, switch to rifaximin . Refer to a GI specialist for further evaluation and management. Discuss the possibility of SIBO and the role of antibiotics in its management

## 2023-11-10 NOTE — Assessment & Plan Note (Signed)
 Benign prostatic hyperplasia with lower urinary tract symptoms   BPH with frequent urination, approximately every hour. Current medication includes finasteride . Discussed the potential for dutasteride , which is more potent, and the use of Flomax  to improve urinary symptoms. Risks of anesthesia for surgical intervention due to age and comorbidities were discussed. Prescribe dutasteride  to replace finasteride , pending insurance approval. Prescribe Flomax  to improve urinary symptoms, with caution due to potential dizziness and hypotension. Refer to a urology specialist for further evaluation, with an appointment expected in April.

## 2023-11-10 NOTE — Patient Instructions (Addendum)
 It was a pleasure seeing you today! Your health and satisfaction are our top priorities.  Bernardino Cone, MD  VISIT SUMMARY: You came in today with concerns about gastrointestinal symptoms and memory issues. We discussed your ongoing stomach pain, gas, and diarrhea, as well as your frequent urination and memory concerns. We also reviewed your history of strokes and low blood pressure.  YOUR PLAN: -IRRITABLE BOWEL SYNDROME WITH DIARRHEA, POSSIBLE CELIAC DISEASE, AND SUSPECTED SMALL INTESTINAL BACTERIAL OVERGROWTH: You have chronic IBS with diarrhea, significant gas, and bloating. We discussed the possibility of celiac disease and small intestinal bacterial overgrowth (SIBO). You should follow a FODMAP and gluten-free diet strictly for 1-2 weeks to identify any triggers. Continue taking Augmentin  for another 14 days, and if possible, switch to rifaximin . You will be referred to a GI specialist for further evaluation.  -BENIGN PROSTATIC HYPERPLASIA WITH LOWER URINARY TRACT SYMPTOMS: You have BPH, which is causing frequent urination. We discussed switching your medication from finasteride  to dutasteride , which is more potent, and adding Flomax  to help with urinary symptoms. Be cautious of potential dizziness and low blood pressure. You will be referred to a urology specialist for further evaluation.  -MEMORY LOSS, UNSPECIFIED: You have been experiencing memory issues, particularly with recalling dietary recommendations. Your memory screening results were satisfactory. We provided written dietary instructions to help you remember your dietary management for IBS.  -GENERAL HEALTH MAINTENANCE: We discussed the importance of vaccinations, including flu, shingles, and COVID-19. You received a flu shot today. Please get the shingles and COVID-19 vaccinations at the pharmacy.  INSTRUCTIONS: Please follow up with the GI specialist and urology specialist as discussed. Continue with the dietary changes and  medications as prescribed. If you experience any new or worsening symptoms, contact our office.  Your Providers PCP: Cone Bernardino MATSU, MD,  [phone]) Referring Provider: Cone Bernardino MATSU, MD,  [phone]) Care Team Provider: Camillo Golas, MD,  [phone]) Care Team Provider: Nahser, Aleene PARAS, MD Care Team Provider: Eletha Boas, MD,  [phone]) Care Team Provider: Devere Lonni Righter, MD,  [phone]) Care Team Provider: Lavella Evalene Dover, MD,  [phone])  NEXT STEPS: [x]  Early Intervention: Schedule sooner appointment, call our on-call services, or go to emergency room if there is any significant Increase in pain or discomfort New or worsening symptoms Sudden or severe changes in your health [x]  Flexible Follow-Up: We recommend a Return in about 2 months (around 01/10/2024). for optimal routine care. This allows for progress monitoring and treatment adjustments. [x]  Preventive Care: Schedule your annual preventive care visit! It's typically covered by insurance and helps identify potential health issues early. [x]  Lab & X-ray Appointments: Incomplete tests scheduled today, or call to schedule. X-rays: [facility] (M-F, 8:30am-noon or 1pm-5pm). [x]  Medical Information Release: Sign a release form at front desk to obtain relevant medical information we don't have.  MAKING THE MOST OF OUR FOCUSED 20 MINUTE APPOINTMENTS: [x]   Clearly state your top concerns at the beginning of the visit to focus our discussion [x]   If you anticipate you will need more time, please inform the front desk during scheduling - we can book multiple appointments in the same week. [x]   If you have transportation problems- use our convenient video appointments or ask about transportation support. [x]   We can get down to business faster if you use MyChart to update information before the visit and submit non-urgent questions before your visit. Thank you for taking the  time to provide details through MyChart.  Let our  nurse know and she can import this information into your encounter documents.  Arrival and Wait Times: [x]   Arriving on time ensures that everyone receives prompt attention. [x]   Early morning (8a) and afternoon (1p) appointments tend to have shortest wait times. [x]   Unfortunately, we cannot delay appointments for late arrivals or hold slots during phone calls.  Getting Answers and Following Up [x]   Simple Questions & Concerns: For quick questions or basic follow-up after your visit, reach us  at [phone] or MyChart messaging. [x]   Complex Concerns: If your concern is more complex, scheduling an appointment might be best. Discuss this with the staff to find the most suitable option. [x]   Lab & Imaging Results: We'll contact you directly if results are abnormal or you don't use MyChart. Most normal results will be on MyChart within 2-3 business days, with a review message from Dr. Jesus. Haven't heard back in 2 weeks? Need results sooner? Contact us  at [phone]. [x]   Referrals: Our referral coordinator will manage specialist referrals. The specialist's office should contact you within 2 weeks to schedule an appointment. Call us  if you haven't heard from them after 2 weeks.  Staying Connected [x]   MyChart: Activate your MyChart for the fastest way to access results and message us . See the last page of this paperwork for instructions on how to activate.  Bring to Your Next Appointment [x]   Medications: Please bring all your medication bottles to your next appointment to ensure we have an accurate record of your prescriptions. [x]   Health Diaries: If you're monitoring any health conditions at home, keeping a diary of your readings can be very helpful for discussions at your next appointment.  Billing [x]   X-ray & Lab Orders: These are billed by separate companies. Contact the invoicing company directly for questions or concerns. [x]    Visit Charges: Discuss any billing inquiries with our administrative services team.  Your Satisfaction Matters [x]   Share Your Experience: We strive for your satisfaction! If you have any complaints, or preferably compliments, please let Dr. Jesus know directly or contact our Practice Administrators, Manuelita Rubin or [employer], by asking at the front desk.   Reviewing Your Records [x]   Review this early draft of your clinical encounter notes below and the final encounter summary tomorrow on MyChart after its been completed.  All orders placed so far are visible here: Incontinence of feces with fecal urgency -     Ambulatory referral to Gastroenterology  Bloating -     Ambulatory referral to Gastroenterology  Gas bloat syndrome -     Ambulatory referral to Gastroenterology  Hearing loss, unspecified hearing loss type, unspecified laterality  CKD stage 3b, GFR 30-44 ml/min (HCC)  Irritable bowel syndrome with diarrhea -     rifAXIMin ; Take 1 tablet (550 mg total) by mouth 3 (three) times daily.  Dispense: 42 tablet; Refill: 0  Benign localized prostatic hyperplasia with lower urinary tract symptoms (LUTS) -     Dutasteride ; Take 1 capsule (0.5 mg total) by mouth daily. Replaces finasteride .  Dispense: 30 capsule; Refill: 1 -     Tamsulosin  HCl; Take 1 capsule (0.4 mg total) by mouth daily. Must monitor blood pressure after to make sure tolerated (lowers blood pressure)  Dispense: 30 capsule; Refill: 3  Other orders -     Flu vaccine HIGH DOSE PF(Fluzone Trivalent)

## 2023-11-10 NOTE — Progress Notes (Signed)
 ==============================  [facility]: [phone]   -- Medical Office Visit --  Patient: Eugene Friedman      Age: 89 y.o.       Sex:  male  Date:   11/10/2023 Today's Healthcare Provider: Bernardino KANDICE Cone, MD  ==============================   Chief Complaint: Gastrointestinal complaints, explosive gas, no pain but fecal incontinence, antibiotic(s) last month helped.  Discussed the use of AI scribe software for clinical note transcription with the patient, who gave verbal consent to proceed. History of Present Illness 89 year old male with IBS who presents with gastrointestinal symptoms and memory concerns.  He has been experiencing significant gastrointestinal symptoms. Intense stomach pain occurs three to four hours after eating, but he reports that he does not currently get stomach pain. However, he now experiences excessive gas and explosive force, which he describes as unprecedented. He is uncertain if these symptoms are related to his diet, as he has not noticed any significant changes in his eating habits. He has been on a new antibiotic for IBS for the past few days, with four days left on this course. He previously used another medication for many years, which he has since stopped. He is not currently taking the old medication alongside the new one. Occasional diarrhea occurs, with incidents of diarrhea segments following normal bowel movements, and he has had a few significant accidents. These symptoms have persisted for over five years.  He mentions frequent urination, approximately every hour. He is currently taking finasteride  for prostate issues.  He has a history of strokes and is currently under the care of a cardiologist. He has experienced low blood pressure in the past, particularly in the mornings, which has led to near fainting episodes. He is cautious about Miralax 's potential to cause diarrhea.  Immunization History   Administered Date(s) Administered   H1N1 02/17/2007   INFLUENZA, HIGH DOSE SEASONAL PF 11/14/2013, 11/20/2014, 12/16/2015, 10/30/2016, 02/04/2018, 11/22/2018, 10/18/2019, 11/23/2019, 12/09/2020, 11/20/2021, 12/15/2022, 11/10/2023   Influenza Whole 12/05/2012   Influenza-Unspecified 12/05/2012   PFIZER(Purple Top)SARS-COV-2 Vaccination 03/09/2019, 03/30/2019, 11/07/2019   Pfizer(Comirnaty )Fall Seasonal Vaccine 12 years and older 12/31/2021, 06/03/2022, 10/28/2022, 05/26/2023   Pneumococcal Conjugate-13 12/05/2014   Pneumococcal Polysaccharide-23 03/08/2008, 05/18/2013   Respiratory Syncytial Virus Vaccine ,Recomb Aduvanted(Arexvy ) 01/29/2022   Td 06/14/2019   Tdap 02/17/2008, 02/11/2021   Zoster, Live 02/16/2005   Health Maintenance Due  Topic Date Due   Zoster Vaccines- Shingrix (1 of 2) [birth year]1949    Background Reviewed: Problem List: has Hyperlipidemia; Vitamin D  deficiency; History of elevated glucose; Elevated prostate specific antigen (PSA); Labile hypertension; Prostate cancer (HCC); Spinal stenosis, lumbar; Persistent atrial fibrillation: CHA2DS2Vasc = 3 (Age & HTN); AVM (arteriovenous malformation); Stage 3b chronic kidney disease (HCC); Aortic valve sclerosis; BPH with obstruction/lower urinary tract symptoms; Healthcare maintenance; Chronic kidney disease (CKD), active medical management without dialysis, stage 2 (mild); Dry mouth; Ambulates with cane; Irritable bowel syndrome with diarrhea; Frailty syndrome in geriatric patient; Chronic pansinusitis; Hearing loss; Gas bloat syndrome; and Incontinence of feces with fecal urgency on their problem list. Past Medical History:  has a past medical history of Allergic rhinitis, Atrial fibrillation by electrocardiogram (HCC) (10/2018), Cancer (HCC), DJD (degenerative joint disease), Hyperlipidemia, Hypertension, Neuromuscular disorder (HCC), Sinus bradycardia, Spinal stenosis, and Vitamin D  deficiency. Past Surgical History:   has a past  surgical history that includes cervical neck mass  (07/08/2006); Appendectomy (1956); Mastoidectomy (Left, 1936); Flexible sigmoidoscopy; Hemorrhoid surgery; Medial partial knee replacement (Left, 2013); and Back Procedure (11/02/2018).  Social History:   reports that he has never smoked. He has never used smokeless tobacco. He reports current alcohol use of about 10.0 standard drinks of alcohol per week. He reports that he does not use drugs. Family History:  family history includes Cancer in his maternal grandmother; Diabetes in his maternal aunt, mother, and paternal aunt; Heart attack (age of onset: 88) in his father; Hypertension in his father; Stroke in his mother. Allergies:  is allergic to celebrex [celecoxib], lipitor [atorvastatin], ppd [tuberculin purified protein derivative], adhesive [tape], and nickel.   Medication Reconciliation: Current Outpatient Medications on File Prior to Visit  Medication Sig   amoxicillin -clavulanate (AUGMENTIN ) 875-125 MG tablet Take 1 tablet by mouth 2 (two) times daily. (Patient not taking: Reported on 11/10/2023)   Cholecalciferol (VITAMIN D -3) 125 MCG (5000 UT) TABS Take 1 tablet by mouth daily at 6 (six) AM.   dicyclomine  (BENTYL ) 20 MG tablet TAKE 1 TABLET BY MOUTH 3 TO 4 TIMES PER DAY BEFORE MEALS AND AT BEDTIME AS NEEDED FOR NAUSEA OR CRAMPING OR BLOATING OR DIARRHEA   diltiazem  (CARDIZEM  CD) 120 MG 24 hr capsule Take  1 capsule  Daily  with Food for BP & Heart Rhythm   ezetimibe  (ZETIA ) 10 MG tablet Take 1 tablet (10 mg total) by mouth daily.   finasteride  (PROSCAR ) 5 MG tablet Take  1 tablet  Daily for Prostate   flecainide  (TAMBOCOR ) 50 MG tablet Take 1 & 1/2  tablet (75 mg)   2 x /day  (every 12 hours )        for Afib   Multiple Vitamin (MULTIVITAMIN) tablet Take 1 tablet by mouth daily.   neomycin -polymyxin-dexamethasone  (MAXITROL) 0.1 % ophthalmic suspension Apply 1 drop to eye 4 (four) times daily.   neomycin -polymyxin-hydrocortisone  (CORTISPORIN) 3.5-10000-1 ophthalmic suspension 4 (four) times daily.   polyethylene glycol (MIRALAX  / GLYCOLAX ) 17 g packet Take 17 g by mouth daily.   Rivaroxaban  (XARELTO ) 15 MG TABS tablet Take  1 tablet  Daily  for Atrial Fibrillation & to Prevent Blood Clots   No current facility-administered medications on file prior to visit.   Medications Discontinued During This Encounter  Medication Reason   rifaximin  (XIFAXAN ) 550 MG TABS tablet Reorder     Physical Exam:    11/10/2023    8:56 AM 10/20/2023    2:00 PM 05/31/2023    7:53 AM  Vitals with BMI  Height 6' 1 6' 1 6' 1  Weight 195 lbs 6 oz 197 lbs 195 lbs 10 oz  BMI 25.79 26 25.81  Systolic 132 138 869  Diastolic 72 80 64  Pulse 76 80 59  Vital signs reviewed.  Nursing notes reviewed. Weight trend reviewed. Physical Activity: Not on file   General Appearance:  No acute distress appreciable.   Well-groomed, healthy-appearing male.  Well proportioned with no abnormal fat distribution.  Good muscle tone. Pulmonary:  Normal work of breathing at rest, no respiratory distress apparent. SpO2: 98 %  Musculoskeletal: All extremities are intact.  Neurological:  Awake, alert, oriented, and engaged.  No obvious focal neurological deficits or cognitive impairments.  Sensorium seems unclouded.   Speech is clear and coherent with logical content. Psychiatric:  Appropriate mood, pleasant and cooperative demeanor, thoughtful and engaged during the exam      11/10/2023    9:09 AM 10/20/2023    2:04 PM 12/09/2021   10:27 PM  6CIT Screen  What Year? 0 points 0 points   What month? 0 points 0  points   What time? 0 points 0 points   Count back from 20 2 points 0 points 2 points  Months in reverse 0 points 0 points   Repeat phrase 2 points 2 points   Total Score 4 points 2 points         12/15/2022   12:25 AM 12/09/2021   10:26 PM 11/22/2021   11:02 PM 06/11/2021   10:59 AM  PHQ 2/9 Scores  PHQ - 2 Score 0 0 0 0    Office Visit on  05/31/2023  Component Date Value Ref Range Status   TSH 05/31/2023 3.290  0.450 - 4.500 uIU/mL Final   Cholesterol 05/31/2023 207 (H)  <200 mg/dL Final   HDL 95/85/7974 99  > OR = 40 mg/dL Final   Triglycerides 95/85/7974 55  <150 mg/dL Final   LDL Cholesterol (Calc) 05/31/2023 94  mg/dL (calc) Final   Total CHOL/HDL Ratio 05/31/2023 2.1  <4.9 (calc) Final   Non-HDL Cholesterol (Calc) 05/31/2023 108  <130 mg/dL (calc) Final   Sodium 95/85/7974 137  135 - 145 mEq/L Final   Potassium 05/31/2023 4.8  3.5 - 5.1 mEq/L Final   Chloride 05/31/2023 103  96 - 112 mEq/L Final   CO2 05/31/2023 26  19 - 32 mEq/L Final   Glucose, Bld 05/31/2023 90  70 - 99 mg/dL Final   BUN 95/85/7974 30 (H)  6 - 23 mg/dL Final   Creatinine, Ser 05/31/2023 1.45  0.40 - 1.50 mg/dL Final   Total Bilirubin 05/31/2023 0.5  0.2 - 1.2 mg/dL Final   Alkaline Phosphatase 05/31/2023 94  39 - 117 U/L Final   AST 05/31/2023 19  0 - 37 U/L Final   ALT 05/31/2023 21  0 - 53 U/L Final   Total Protein 05/31/2023 6.6  6.0 - 8.3 g/dL Final   Albumin 95/85/7974 4.5  3.5 - 5.2 g/dL Final   GFR 95/85/7974 41.75 (L)  >60.00 mL/min Final   Calcium  05/31/2023 9.3  8.4 - 10.5 mg/dL Final   Hgb J8r MFr Bld 05/31/2023 5.4  4.6 - 6.5 % Final   VITD 05/31/2023 79.84  30.00 - 100.00 ng/mL Final   PSA 05/31/2023 8.48 (H)  0.10 - 4.00 ng/mL Final  Office Visit on 12/15/2022  Component Date Value Ref Range Status   Color, Urine 12/15/2022 YELLOW  YELLOW Final   APPearance 12/15/2022 CLEAR  CLEAR Final   Specific Gravity, Urine 12/15/2022 1.012  1.001 - 1.035 Final   pH 12/15/2022 6.0  5.0 - 8.0 Final   Glucose, UA 12/15/2022 NEGATIVE  NEGATIVE Final   Bilirubin Urine 12/15/2022 NEGATIVE  NEGATIVE Final   Ketones, ur 12/15/2022 NEGATIVE  NEGATIVE Final   Hgb urine dipstick 12/15/2022 NEGATIVE  NEGATIVE Final   Protein, ur 12/15/2022 NEGATIVE  NEGATIVE Final   Nitrite 12/15/2022 NEGATIVE  NEGATIVE Final   Leukocytes,Ua 12/15/2022 NEGATIVE   NEGATIVE Final   Creatinine, Urine 12/15/2022 60  20 - 320 mg/dL Final   Microalb, Ur 89/70/7975 8.5  mg/dL Final   Microalb Creat Ratio 12/15/2022 142 (H)  <30 mg/g creat Final   PSA 12/15/2022 4.94 (H)  < OR = 4.00 ng/mL Final   PTH 12/15/2022 54  16 - 77 pg/mL Final   WBC 12/15/2022 5.2  3.8 - 10.8 Thousand/uL Final   RBC 12/15/2022 4.27  4.20 - 5.80 Million/uL Final   Hemoglobin 12/15/2022 13.0 (L)  13.2 - 17.1 g/dL Final   HCT 89/70/7975 39.6  38.5 - 50.0 %  Final   MCV 12/15/2022 92.7  80.0 - 100.0 fL Final   MCH 12/15/2022 30.4  27.0 - 33.0 pg Final   MCHC 12/15/2022 32.8  32.0 - 36.0 g/dL Final   RDW 89/70/7975 12.8  11.0 - 15.0 % Final   Platelets 12/15/2022 237  140 - 400 Thousand/uL Final   MPV 12/15/2022 9.3  7.5 - 12.5 fL Final   Neutro Abs 12/15/2022 3,266  1,500 - 7,800 cells/uL Final   Absolute Lymphocytes 12/15/2022 1,300  850 - 3,900 cells/uL Final   Absolute Monocytes 12/15/2022 473  200 - 950 cells/uL Final   Eosinophils Absolute 12/15/2022 130  15 - 500 cells/uL Final   Basophils Absolute 12/15/2022 31  0 - 200 cells/uL Final   Neutrophils Relative % 12/15/2022 62.8  % Final   Total Lymphocyte 12/15/2022 25.0  % Final   Monocytes Relative 12/15/2022 9.1  % Final   Eosinophils Relative 12/15/2022 2.5  % Final   Basophils Relative 12/15/2022 0.6  % Final   Glucose, Bld 12/15/2022 93  65 - 99 mg/dL Final   BUN 89/70/7975 34 (H)  7 - 25 mg/dL Final   Creat 89/70/7975 1.49 (H)  0.70 - 1.22 mg/dL Final   eGFR 89/70/7975 44 (L)  > OR = 60 mL/min/1.44m2 Final   BUN/Creatinine Ratio 12/15/2022 23 (H)  6 - 22 (calc) Final   Sodium 12/15/2022 139  135 - 146 mmol/L Final   Potassium 12/15/2022 4.9  3.5 - 5.3 mmol/L Final   Chloride 12/15/2022 104  98 - 110 mmol/L Final   CO2 12/15/2022 27  20 - 32 mmol/L Final   Calcium  12/15/2022 9.5  8.6 - 10.3 mg/dL Final   Total Protein 89/70/7975 6.8  6.1 - 8.1 g/dL Final   Albumin 89/70/7975 4.3  3.6 - 5.1 g/dL Final   Globulin  89/70/7975 2.5  1.9 - 3.7 g/dL (calc) Final   AG Ratio 12/15/2022 1.7  1.0 - 2.5 (calc) Final   Total Bilirubin 12/15/2022 0.5  0.2 - 1.2 mg/dL Final   Alkaline phosphatase (APISO) 12/15/2022 85  35 - 144 U/L Final   AST 12/15/2022 17  10 - 35 U/L Final   ALT 12/15/2022 18  9 - 46 U/L Final   Magnesium 12/15/2022 2.1  1.5 - 2.5 mg/dL Final   Cholesterol 89/70/7975 203 (H)  <200 mg/dL Final   HDL 89/70/7975 92  > OR = 40 mg/dL Final   Triglycerides 89/70/7975 68  <150 mg/dL Final   LDL Cholesterol (Calc) 12/15/2022 96  mg/dL (calc) Final   Total CHOL/HDL Ratio 12/15/2022 2.2  <4.9 (calc) Final   Non-HDL Cholesterol (Calc) 12/15/2022 111  <130 mg/dL (calc) Final   TSH 89/70/7975 2.82  0.40 - 4.50 mIU/L Final   Hgb A1c MFr Bld 12/15/2022 5.4  <5.7 % of total Hgb Final   Mean Plasma Glucose 12/15/2022 108  mg/dL Final   eAG (mmol/L) 89/70/7975 6.0  mmol/L Final   Insulin  12/15/2022 11.3  uIU/mL Final  Office Visit on 06/12/2022  Component Date Value Ref Range Status   WBC 06/12/2022 5.5  3.8 - 10.8 Thousand/uL Final   RBC 06/12/2022 4.18 (L)  4.20 - 5.80 Million/uL Final   Hemoglobin 06/12/2022 12.8 (L)  13.2 - 17.1 g/dL Final   HCT 95/73/7975 38.2 (L)  38.5 - 50.0 % Final   MCV 06/12/2022 91.4  80.0 - 100.0 fL Final   MCH 06/12/2022 30.6  27.0 - 33.0 pg Final   MCHC 06/12/2022 33.5  32.0 - 36.0 g/dL Final   RDW 95/73/7975 13.2  11.0 - 15.0 % Final   Platelets 06/12/2022 232  140 - 400 Thousand/uL Final   MPV 06/12/2022 9.3  7.5 - 12.5 fL Final   Neutro Abs 06/12/2022 3,542  1,500 - 7,800 cells/uL Final   Lymphs Abs 06/12/2022 1,183  850 - 3,900 cells/uL Final   Absolute Monocytes 06/12/2022 567  200 - 950 cells/uL Final   Eosinophils Absolute 06/12/2022 160  15 - 500 cells/uL Final   Basophils Absolute 06/12/2022 50  0 - 200 cells/uL Final   Neutrophils Relative % 06/12/2022 64.4  % Final   Total Lymphocyte 06/12/2022 21.5  % Final   Monocytes Relative 06/12/2022 10.3  % Final    Eosinophils Relative 06/12/2022 2.9  % Final   Basophils Relative 06/12/2022 0.9  % Final   Glucose, Bld 06/12/2022 94  65 - 99 mg/dL Final   BUN 95/73/7975 27 (H)  7 - 25 mg/dL Final   Creat 95/73/7975 1.29 (H)  0.70 - 1.22 mg/dL Final   eGFR 95/73/7975 52 (L)  > OR = 60 mL/min/1.16m2 Final   BUN/Creatinine Ratio 06/12/2022 21  6 - 22 (calc) Final   Sodium 06/12/2022 136  135 - 146 mmol/L Final   Potassium 06/12/2022 4.7  3.5 - 5.3 mmol/L Final   Chloride 06/12/2022 103  98 - 110 mmol/L Final   CO2 06/12/2022 27  20 - 32 mmol/L Final   Calcium  06/12/2022 9.2  8.6 - 10.3 mg/dL Final   Total Protein 95/73/7975 6.3  6.1 - 8.1 g/dL Final   Albumin 95/73/7975 4.3  3.6 - 5.1 g/dL Final   Globulin 95/73/7975 2.0  1.9 - 3.7 g/dL (calc) Final   AG Ratio 06/12/2022 2.2  1.0 - 2.5 (calc) Final   Total Bilirubin 06/12/2022 0.6  0.2 - 1.2 mg/dL Final   Alkaline phosphatase (APISO) 06/12/2022 89  35 - 144 U/L Final   AST 06/12/2022 20  10 - 35 U/L Final   ALT 06/12/2022 21  9 - 46 U/L Final   Cholesterol 06/12/2022 190  <200 mg/dL Final   HDL 95/73/7975 93  > OR = 40 mg/dL Final   Triglycerides 95/73/7975 95  <150 mg/dL Final   LDL Cholesterol (Calc) 06/12/2022 79  mg/dL (calc) Final   Total CHOL/HDL Ratio 06/12/2022 2.0  <4.9 (calc) Final   Non-HDL Cholesterol (Calc) 06/12/2022 97  <130 mg/dL (calc) Final   Hgb J8r MFr Bld 06/12/2022 5.4  <5.7 % of total Hgb Final   Mean Plasma Glucose 06/12/2022 108  mg/dL Final   eAG (mmol/L) 95/73/7975 6.0  mmol/L Final   Vit D, 25-Hydroxy 06/12/2022 77  30 - 100 ng/mL Final  Office Visit on 02/03/2022  Component Date Value Ref Range Status   WBC 02/03/2022 5.7  3.8 - 10.8 Thousand/uL Final   RBC 02/03/2022 4.25  4.20 - 5.80 Million/uL Final   Hemoglobin 02/03/2022 13.2  13.2 - 17.1 g/dL Final   HCT 87/80/7976 38.8  38.5 - 50.0 % Final   MCV 02/03/2022 91.3  80.0 - 100.0 fL Final   MCH 02/03/2022 31.1  27.0 - 33.0 pg Final   MCHC 02/03/2022 34.0  32.0 -  36.0 g/dL Final   RDW 87/80/7976 13.3  11.0 - 15.0 % Final   Platelets 02/03/2022 251  140 - 400 Thousand/uL Final   MPV 02/03/2022 9.4  7.5 - 12.5 fL Final   Neutro Abs 02/03/2022 3,620  1,500 - 7,800 cells/uL  Final   Lymphs Abs 02/03/2022 1,311  850 - 3,900 cells/uL Final   Absolute Monocytes 02/03/2022 599  200 - 950 cells/uL Final   Eosinophils Absolute 02/03/2022 131  15 - 500 cells/uL Final   Basophils Absolute 02/03/2022 40  0 - 200 cells/uL Final   Neutrophils Relative % 02/03/2022 63.5  % Final   Total Lymphocyte 02/03/2022 23.0  % Final   Monocytes Relative 02/03/2022 10.5  % Final   Eosinophils Relative 02/03/2022 2.3  % Final   Basophils Relative 02/03/2022 0.7  % Final   Glucose, Bld 02/03/2022 96  65 - 99 mg/dL Final   BUN 87/80/7976 29 (H)  7 - 25 mg/dL Final   Creat 87/80/7976 1.56 (H)  0.70 - 1.22 mg/dL Final   eGFR 87/80/7976 42 (L)  > OR = 60 mL/min/1.41m2 Final   BUN/Creatinine Ratio 02/03/2022 19  6 - 22 (calc) Final   Sodium 02/03/2022 136  135 - 146 mmol/L Final   Potassium 02/03/2022 4.8  3.5 - 5.3 mmol/L Final   Chloride 02/03/2022 104  98 - 110 mmol/L Final   CO2 02/03/2022 26  20 - 32 mmol/L Final   Calcium  02/03/2022 9.1  8.6 - 10.3 mg/dL Final   Total Protein 87/80/7976 6.6  6.1 - 8.1 g/dL Final   Albumin 87/80/7976 4.0  3.6 - 5.1 g/dL Final   Globulin 87/80/7976 2.6  1.9 - 3.7 g/dL (calc) Final   AG Ratio 02/03/2022 1.5  1.0 - 2.5 (calc) Final   Total Bilirubin 02/03/2022 0.5  0.2 - 1.2 mg/dL Final   Alkaline phosphatase (APISO) 02/03/2022 89  35 - 144 U/L Final   AST 02/03/2022 18  10 - 35 U/L Final   ALT 02/03/2022 19  9 - 46 U/L Final   Magnesium 02/03/2022 2.2  1.5 - 2.5 mg/dL Final   TSH 87/80/7976 3.80  0.40 - 4.50 mIU/L Final  Admission on 01/30/2022, Discharged on 01/30/2022  Component Date Value Ref Range Status   WBC 01/30/2022 6.2  4.0 - 10.5 K/uL Final   RBC 01/30/2022 4.07 (L)  4.22 - 5.81 MIL/uL Final   Hemoglobin 01/30/2022 12.5 (L)   13.0 - 17.0 g/dL Final   HCT 87/84/7976 37.6 (L)  39.0 - 52.0 % Final   MCV 01/30/2022 92.4  80.0 - 100.0 fL Final   MCH 01/30/2022 30.7  26.0 - 34.0 pg Final   MCHC 01/30/2022 33.2  30.0 - 36.0 g/dL Final   RDW 87/84/7976 14.3  11.5 - 15.5 % Final   Platelets 01/30/2022 219  150 - 400 K/uL Final   nRBC 01/30/2022 0.0  0.0 - 0.2 % Final   Neutrophils Relative % 01/30/2022 74  % Final   Neutro Abs 01/30/2022 4.6  1.7 - 7.7 K/uL Final   Lymphocytes Relative 01/30/2022 14  % Final   Lymphs Abs 01/30/2022 0.9  0.7 - 4.0 K/uL Final   Monocytes Relative 01/30/2022 10  % Final   Monocytes Absolute 01/30/2022 0.6  0.1 - 1.0 K/uL Final   Eosinophils Relative 01/30/2022 1  % Final   Eosinophils Absolute 01/30/2022 0.1  0.0 - 0.5 K/uL Final   Basophils Relative 01/30/2022 1  % Final   Basophils Absolute 01/30/2022 0.0  0.0 - 0.1 K/uL Final   Immature Granulocytes 01/30/2022 0  % Final   Abs Immature Granulocytes 01/30/2022 0.02  0.00 - 0.07 K/uL Final   Sodium 01/30/2022 133 (L)  135 - 145 mmol/L Final   Potassium 01/30/2022 4.9  3.5 - 5.1 mmol/L Final   Chloride 01/30/2022 100  98 - 111 mmol/L Final   CO2 01/30/2022 24  22 - 32 mmol/L Final   Glucose, Bld 01/30/2022 103 (H)  70 - 99 mg/dL Final   BUN 87/84/7976 28 (H)  8 - 23 mg/dL Final   Creatinine, Ser 01/30/2022 1.70 (H)  0.61 - 1.24 mg/dL Final   Calcium  01/30/2022 8.9  8.9 - 10.3 mg/dL Final   Total Protein 87/84/7976 6.7  6.5 - 8.1 g/dL Final   Albumin 87/84/7976 4.0  3.5 - 5.0 g/dL Final   AST 87/84/7976 21  15 - 41 U/L Final   ALT 01/30/2022 19  0 - 44 U/L Final   Alkaline Phosphatase 01/30/2022 81  38 - 126 U/L Final   Total Bilirubin 01/30/2022 0.6  0.3 - 1.2 mg/dL Final   GFR, Estimated 01/30/2022 38 (L)  >60 mL/min Final   Anion gap 01/30/2022 9  5 - 15 Final   Glucose-Capillary 01/30/2022 104 (H)  70 - 99 mg/dL Final   Color, Urine 87/84/7976 YELLOW  YELLOW Final   APPearance 01/30/2022 CLEAR  CLEAR Final   Specific  Gravity, Urine 01/30/2022 1.005  1.005 - 1.030 Final   pH 01/30/2022 6.0  5.0 - 8.0 Final   Glucose, UA 01/30/2022 NEGATIVE  NEGATIVE mg/dL Final   Hgb urine dipstick 01/30/2022 NEGATIVE  NEGATIVE Final   Bilirubin Urine 01/30/2022 NEGATIVE  NEGATIVE Final   Ketones, ur 01/30/2022 NEGATIVE  NEGATIVE mg/dL Final   Protein, ur 87/84/7976 NEGATIVE  NEGATIVE mg/dL Final   Nitrite 87/84/7976 NEGATIVE  NEGATIVE Final   Leukocytes,Ua 01/30/2022 NEGATIVE  NEGATIVE Final  Office Visit on 12/09/2021  Component Date Value Ref Range Status   Color, Urine 12/09/2021 YELLOW  YELLOW Final   APPearance 12/09/2021 CLEAR  CLEAR Final   Specific Gravity, Urine 12/09/2021 1.013  1.001 - 1.035 Final   pH 12/09/2021 5.5  5.0 - 8.0 Final   Glucose, UA 12/09/2021 NEGATIVE  NEGATIVE Final   Bilirubin Urine 12/09/2021 NEGATIVE  NEGATIVE Final   Ketones, ur 12/09/2021 NEGATIVE  NEGATIVE Final   Hgb urine dipstick 12/09/2021 NEGATIVE  NEGATIVE Final   Protein, ur 12/09/2021 NEGATIVE  NEGATIVE Final   Nitrite 12/09/2021 NEGATIVE  NEGATIVE Final   Leukocytes,Ua 12/09/2021 NEGATIVE  NEGATIVE Final   Creatinine, Urine 12/09/2021 85  20 - 320 mg/dL Final   Microalb, Ur 89/75/7976 4.4  mg/dL Final   Microalb Creat Ratio 12/09/2021 52 (H)  <30 mcg/mg creat Final   PSA 12/09/2021 6.04 (H)  < OR = 4.00 ng/mL Final   PTH 12/09/2021 53  16 - 77 pg/mL Final   Calcium  12/09/2021 9.2  8.6 - 10.3 mg/dL Final   WBC 89/75/7976 5.4  3.8 - 10.8 Thousand/uL Final   RBC 12/09/2021 4.28  4.20 - 5.80 Million/uL Final   Hemoglobin 12/09/2021 13.1 (L)  13.2 - 17.1 g/dL Final   HCT 89/75/7976 39.1  38.5 - 50.0 % Final   MCV 12/09/2021 91.4  80.0 - 100.0 fL Final   MCH 12/09/2021 30.6  27.0 - 33.0 pg Final   MCHC 12/09/2021 33.5  32.0 - 36.0 g/dL Final   RDW 89/75/7976 13.0  11.0 - 15.0 % Final   Platelets 12/09/2021 268  140 - 400 Thousand/uL Final   MPV 12/09/2021 9.2  7.5 - 12.5 fL Final   Neutro Abs 12/09/2021 3,461  1,500 -  7,800 cells/uL Final   Lymphs Abs 12/09/2021 1,301  850 - 3,900  cells/uL Final   Absolute Monocytes 12/09/2021 475  200 - 950 cells/uL Final   Eosinophils Absolute 12/09/2021 130  15 - 500 cells/uL Final   Basophils Absolute 12/09/2021 32  0 - 200 cells/uL Final   Neutrophils Relative % 12/09/2021 64.1  % Final   Total Lymphocyte 12/09/2021 24.1  % Final   Monocytes Relative 12/09/2021 8.8  % Final   Eosinophils Relative 12/09/2021 2.4  % Final   Basophils Relative 12/09/2021 0.6  % Final   Glucose, Bld 12/09/2021 87  65 - 99 mg/dL Final   BUN 89/75/7976 26 (H)  7 - 25 mg/dL Final   Creat 89/75/7976 1.54 (H)  0.70 - 1.22 mg/dL Final   eGFR 89/75/7976 42 (L)  > OR = 60 mL/min/1.66m2 Final   BUN/Creatinine Ratio 12/09/2021 17  6 - 22 (calc) Final   Sodium 12/09/2021 138  135 - 146 mmol/L Final   Potassium 12/09/2021 5.0  3.5 - 5.3 mmol/L Final   Chloride 12/09/2021 104  98 - 110 mmol/L Final   CO2 12/09/2021 24  20 - 32 mmol/L Final   Calcium  12/09/2021 9.2  8.6 - 10.3 mg/dL Final   Total Protein 89/75/7976 6.9  6.1 - 8.1 g/dL Final   Albumin 89/75/7976 4.4  3.6 - 5.1 g/dL Final   Globulin 89/75/7976 2.5  1.9 - 3.7 g/dL (calc) Final   AG Ratio 12/09/2021 1.8  1.0 - 2.5 (calc) Final   Total Bilirubin 12/09/2021 0.5  0.2 - 1.2 mg/dL Final   Alkaline phosphatase (APISO) 12/09/2021 102  35 - 144 U/L Final   AST 12/09/2021 19  10 - 35 U/L Final   ALT 12/09/2021 19  9 - 46 U/L Final   Magnesium 12/09/2021 2.1  1.5 - 2.5 mg/dL Final   Cholesterol 89/75/7976 230 (H)  <200 mg/dL Final   HDL 89/75/7976 89  > OR = 40 mg/dL Final   Triglycerides 89/75/7976 77  <150 mg/dL Final   LDL Cholesterol (Calc) 12/09/2021 124 (H)  mg/dL (calc) Final   Total CHOL/HDL Ratio 12/09/2021 2.6  <4.9 (calc) Final   Non-HDL Cholesterol (Calc) 12/09/2021 141 (H)  <130 mg/dL (calc) Final   TSH 89/75/7976 3.17  0.40 - 4.50 mIU/L Final   Hgb A1c MFr Bld 12/09/2021 5.4  <5.7 % of total Hgb Final   Mean Plasma Glucose  12/09/2021 108  mg/dL Final   eAG (mmol/L) 89/75/7976 6.0  mmol/L Final   Insulin  12/09/2021 12.5  uIU/mL Final   Vit D, 25-Hydroxy 12/09/2021 60  30 - 100 ng/mL Final  No image results found. No results found.       ASSESSMENT & PLAN   Assessment & Plan Incontinence of feces with fecal urgency Bloating Gas bloat syndrome Irritable bowel syndrome with diarrhea Irritable bowel syndrome with diarrhea, possible celiac disease, and suspected small intestinal bacterial overgrowth   Chronic IBS with diarrhea, significant gas, and bloating. Possible celiac disease and suspected SIBO due to response to antibiotics. Provide handouts for FODMAP and gluten-free diets. Instruct to adhere strictly to each diet for 1-2 weeks to identify triggers. Extend Augmentin  for another 14 days. If insurance allows, switch to rifaximin . Refer to a GI specialist for further evaluation and management. Discuss the possibility of SIBO and the role of antibiotics in its management Hearing loss, unspecified hearing loss type, unspecified laterality Repeated screening for Memory loss, unspecified due to Memory issues, particularly in recalling dietary recommendations suspected . Memory screening performed, with satisfactory results. Memory issues may  impact adherence to dietary management for IBS. Conduct memory screening tests to assess cognitive function. Provide written dietary instructions to aid memory retention.  I believe that hearing loss is severe but memory is ok after repeat analysis CKD stage 3b, GFR 30-44 ml/min (HCC) Lab Results  Component Value Date   GFR 41.75 (L) 05/31/2023   Lab Results  Component Value Date   EGFR 44 (L) 12/15/2022   EGFR 52 (L) 06/12/2022   EGFR 42 (L) 02/03/2022   EGFR 42 (L) 12/09/2021   EGFR 45 (L) 06/11/2021   EGFR 49 (L) 12/09/2020  Stable in 40's for years.  Given age will reduce blood draws. Benign localized prostatic hyperplasia with lower urinary tract symptoms  (LUTS) Benign prostatic hyperplasia with lower urinary tract symptoms   BPH with frequent urination, approximately every hour. Current medication includes finasteride . Discussed the potential for dutasteride , which is more potent, and the use of Flomax  to improve urinary symptoms. Risks of anesthesia for surgical intervention due to age and comorbidities were discussed. Prescribe dutasteride  to replace finasteride , pending insurance approval. Prescribe Flomax  to improve urinary symptoms, with caution due to potential dizziness and hypotension. Refer to a urology specialist for further evaluation, with an appointment expected in April. Discussed vaccinations, including flu, shingles, and COVID-19. Advised on the importance of these vaccinations for ongoing health maintenance. Administer flu shot. Advise to receive shingles and COVID-19 vaccinations at the pharmacy.  ORDER ASSOCIATIONS  #   DIAGNOSIS / CONDITION ICD-10 ENCOUNTER ORDER     ICD-10-CM   1. Incontinence of feces with fecal urgency  R15.9 Ambulatory referral to Gastroenterology   R15.2     2. Bloating  R14.0 Ambulatory referral to Gastroenterology    3. Gas bloat syndrome  K92.89 Ambulatory referral to Gastroenterology    4. Hearing loss, unspecified hearing loss type, unspecified laterality  H91.90     5. Chronic kidney disease (CKD), active medical management without dialysis, stage 2 (mild)  N18.2     6. Chronic pansinusitis  J32.4     7. Chronic cough  R05.3     8. Irritable bowel syndrome with diarrhea  K58.0 rifaximin  (XIFAXAN ) 550 MG TABS tablet    9. Benign localized prostatic hyperplasia with lower urinary tract symptoms (LUTS)  N40.1 dutasteride  (AVODART ) 0.5 MG capsule    tamsulosin  (FLOMAX ) 0.4 MG CAPS capsule         Orders Placed in Encounter:    Referral Orders         Ambulatory referral to Gastroenterology     Meds ordered this encounter  Medications   rifaximin  (XIFAXAN ) 550 MG TABS tablet    Sig:  Take 1 tablet (550 mg total) by mouth 3 (three) times daily.    Dispense:  42 tablet    Refill:  0   dutasteride  (AVODART ) 0.5 MG capsule    Sig: Take 1 capsule (0.5 mg total) by mouth daily. Replaces finasteride .    Dispense:  30 capsule    Refill:  1   tamsulosin  (FLOMAX ) 0.4 MG CAPS capsule    Sig: Take 1 capsule (0.4 mg total) by mouth daily. Must monitor blood pressure after to make sure tolerated (lowers blood pressure)    Dispense:  30 capsule    Refill:  3    Orders Placed This Encounter  Procedures   Flu vaccine HIGH DOSE PF(Fluzone Trivalent)   Ambulatory referral to Gastroenterology    Referral Priority:   Routine    Referral Type:  Consultation    Referral Reason:   Specialty Services Required    Number of Visits Requested:   1     This document was synthesized by artificial intelligence (Abridge) using HIPAA-compliant recording of the clinical interaction;   We discussed the use of AI scribe software for clinical note transcription with the patient, who gave verbal consent to proceed. additional Info: This encounter employed state-of-the-art, real-time, collaborative documentation. The patient actively reviewed and assisted in updating their electronic medical record on a shared screen, ensuring transparency and facilitating joint problem-solving for the problem list, overview, and plan. This approach promotes accurate, informed care. The treatment plan was discussed and reviewed in detail, including medication safety, potential side effects, and all patient questions. We confirmed understanding and comfort with the plan. Follow-up instructions were established, including contacting the office for any concerns, returning if symptoms worsen, persist, or new symptoms develop, and precautions for potential emergency department visits.

## 2023-11-10 NOTE — Assessment & Plan Note (Signed)
 Lab Results  Component Value Date   GFR 41.75 (L) 05/31/2023   Lab Results  Component Value Date   EGFR 44 (L) 12/15/2022   EGFR 52 (L) 06/12/2022   EGFR 42 (L) 02/03/2022   EGFR 42 (L) 12/09/2021   EGFR 45 (L) 06/11/2021   EGFR 49 (L) 12/09/2020  Stable in 40's for years.  Given age will reduce blood draws.

## 2023-11-10 NOTE — Assessment & Plan Note (Signed)
 Repeated screening for Memory loss, unspecified due to Memory issues, particularly in recalling dietary recommendations suspected . Memory screening performed, with satisfactory results. Memory issues may impact adherence to dietary management for IBS. Conduct memory screening tests to assess cognitive function. Provide written dietary instructions to aid memory retention.  I believe that hearing loss is severe but memory is ok after repeat analysis

## 2023-11-15 ENCOUNTER — Other Ambulatory Visit: Payer: Self-pay | Admitting: Internal Medicine

## 2023-11-15 ENCOUNTER — Telehealth: Payer: Self-pay

## 2023-11-15 ENCOUNTER — Other Ambulatory Visit: Payer: Self-pay

## 2023-11-15 ENCOUNTER — Other Ambulatory Visit (HOSPITAL_COMMUNITY): Payer: Self-pay

## 2023-11-15 ENCOUNTER — Other Ambulatory Visit (HOSPITAL_BASED_OUTPATIENT_CLINIC_OR_DEPARTMENT_OTHER): Payer: Self-pay

## 2023-11-15 DIAGNOSIS — K580 Irritable bowel syndrome with diarrhea: Secondary | ICD-10-CM

## 2023-11-15 MED ORDER — RIFAXIMIN 550 MG PO TABS
550.0000 mg | ORAL_TABLET | Freq: Three times a day (TID) | ORAL | 0 refills | Status: AC
  Filled 2023-11-15: qty 42, 14d supply, fill #0

## 2023-11-15 NOTE — Telephone Encounter (Signed)
 Xifaxan  550MG  tablets spoke with pt medication was ready to be pick up and approved.

## 2023-11-15 NOTE — Telephone Encounter (Unsigned)
 Copied from CRM [id number]. Topic: Clinical - Medication Refill >> Nov 15, 2023 10:42 AM Tanazia G wrote: Medication: rifaximin  (XIFAXAN ) 550 MG TABS tablet  Has the patient contacted their pharmacy? Yes (Agent: If no, request that the patient contact the pharmacy for the refill. If patient does not wish to contact the pharmacy document the reason why and proceed with request.) (Agent: If yes, when and what did the pharmacy advise?)  This is the patient's preferred pharmacy:  [id number] [address],  - [address] AT [facility] [address] & [facility] [address] [place] [address] Phone: [phone] Fax: [phone]   Is this the correct pharmacy for this prescription? Yes If no, delete pharmacy and type the correct one.   Has the prescription been filled recently? Yes  Is the patient out of the medication? Yes  Has the patient been seen for an appointment in the last year OR does the patient have an upcoming appointment? Yes  Can we respond through MyChart? Yes  Agent: Please be advised that Rx refills may take up to 3 business days. We ask that you follow-up with your pharmacy.

## 2023-11-15 NOTE — Telephone Encounter (Signed)
 Pharmacy Patient Advocate Encounter  Received notification from [employer] that Prior Authorization for Xifaxan  550MG  tablets  has been APPROVED from 10/16/23 to 11/29/23. Ran test claim, Copay is $0. This test claim was processed through [facility]- copay amounts may vary at other pharmacies due to pharmacy/plan contracts, or as the patient moves through the different stages of their insurance plan.   PA #/Case ID/Reference #: [id number]  *LVM with [facility] to process prescription

## 2023-11-15 NOTE — Telephone Encounter (Signed)
 Copied from CRM [id number]. Topic: Clinical - Prescription Issue >> Nov 15, 2023 10:44 AM Mercedes MATSU wrote: Reason for CRM: Patient called in stating that he needs a pre authorization for rifaximin  (XIFAXAN ) 550 MG TABS tablet. Patient is requesting a call back at [phone] to ensure it has been sent.  Spoke with pt about medication via phone sent it up to the PA team to work on for pt I advised pt that I would let them know soon as I know if it is approved or not.

## 2023-11-16 ENCOUNTER — Other Ambulatory Visit: Payer: Self-pay | Admitting: Urology

## 2023-11-16 DIAGNOSIS — R972 Elevated prostate specific antigen [PSA]: Secondary | ICD-10-CM

## 2023-11-16 DIAGNOSIS — C61 Malignant neoplasm of prostate: Secondary | ICD-10-CM

## 2023-12-02 ENCOUNTER — Other Ambulatory Visit (HOSPITAL_BASED_OUTPATIENT_CLINIC_OR_DEPARTMENT_OTHER): Payer: Self-pay

## 2023-12-02 MED ORDER — COMIRNATY 30 MCG/0.3ML IM SUSY
0.3000 mL | PREFILLED_SYRINGE | Freq: Once | INTRAMUSCULAR | 0 refills | Status: AC
  Filled 2023-12-02: qty 0.3, 1d supply, fill #0

## 2023-12-20 ENCOUNTER — Encounter: Payer: Self-pay | Admitting: Radiology

## 2023-12-22 ENCOUNTER — Ambulatory Visit (INDEPENDENT_AMBULATORY_CARE_PROVIDER_SITE_OTHER): Admitting: Internal Medicine

## 2023-12-22 ENCOUNTER — Encounter: Payer: Self-pay | Admitting: Internal Medicine

## 2023-12-22 VITALS — BP 138/70 | HR 66 | Temp 98.0°F | Ht 73.0 in | Wt 194.0 lb

## 2023-12-22 DIAGNOSIS — N1832 Chronic kidney disease, stage 3b: Secondary | ICD-10-CM | POA: Diagnosis not present

## 2023-12-22 DIAGNOSIS — R152 Fecal urgency: Secondary | ICD-10-CM

## 2023-12-22 DIAGNOSIS — K580 Irritable bowel syndrome with diarrhea: Secondary | ICD-10-CM

## 2023-12-22 DIAGNOSIS — R54 Age-related physical debility: Secondary | ICD-10-CM | POA: Diagnosis not present

## 2023-12-22 DIAGNOSIS — C61 Malignant neoplasm of prostate: Secondary | ICD-10-CM

## 2023-12-22 DIAGNOSIS — Z0001 Encounter for general adult medical examination with abnormal findings: Secondary | ICD-10-CM

## 2023-12-22 DIAGNOSIS — S81812A Laceration without foreign body, left lower leg, initial encounter: Secondary | ICD-10-CM

## 2023-12-22 DIAGNOSIS — N182 Chronic kidney disease, stage 2 (mild): Secondary | ICD-10-CM | POA: Diagnosis not present

## 2023-12-22 DIAGNOSIS — Z1211 Encounter for screening for malignant neoplasm of colon: Secondary | ICD-10-CM

## 2023-12-22 DIAGNOSIS — E782 Mixed hyperlipidemia: Secondary | ICD-10-CM

## 2023-12-22 DIAGNOSIS — Z9989 Dependence on other enabling machines and devices: Secondary | ICD-10-CM

## 2023-12-22 DIAGNOSIS — L03116 Cellulitis of left lower limb: Secondary | ICD-10-CM | POA: Diagnosis not present

## 2023-12-22 DIAGNOSIS — Z23 Encounter for immunization: Secondary | ICD-10-CM

## 2023-12-22 LAB — CBC WITH DIFFERENTIAL/PLATELET
Basophils Absolute: 0 K/uL (ref 0.0–0.1)
Basophils Relative: 0.5 % (ref 0.0–3.0)
Eosinophils Absolute: 0.1 K/uL (ref 0.0–0.7)
Eosinophils Relative: 1.2 % (ref 0.0–5.0)
HCT: 38.6 % — ABNORMAL LOW (ref 39.0–52.0)
Hemoglobin: 13.1 g/dL (ref 13.0–17.0)
Lymphocytes Relative: 14.9 % (ref 12.0–46.0)
Lymphs Abs: 0.8 K/uL (ref 0.7–4.0)
MCHC: 33.9 g/dL (ref 30.0–36.0)
MCV: 92 fl (ref 78.0–100.0)
Monocytes Absolute: 0.4 K/uL (ref 0.1–1.0)
Monocytes Relative: 7.9 % (ref 3.0–12.0)
Neutro Abs: 3.8 K/uL (ref 1.4–7.7)
Neutrophils Relative %: 75.5 % (ref 43.0–77.0)
Platelets: 225 K/uL (ref 150.0–400.0)
RBC: 4.19 Mil/uL — ABNORMAL LOW (ref 4.22–5.81)
RDW: 14.9 % (ref 11.5–15.5)
WBC: 5.1 K/uL (ref 4.0–10.5)

## 2023-12-22 LAB — COMPREHENSIVE METABOLIC PANEL WITH GFR
ALT: 15 U/L (ref 0–53)
AST: 17 U/L (ref 0–37)
Albumin: 4.4 g/dL (ref 3.5–5.2)
Alkaline Phosphatase: 90 U/L (ref 39–117)
BUN: 30 mg/dL — ABNORMAL HIGH (ref 6–23)
CO2: 27 meq/L (ref 19–32)
Calcium: 9.3 mg/dL (ref 8.4–10.5)
Chloride: 100 meq/L (ref 96–112)
Creatinine, Ser: 1.52 mg/dL — ABNORMAL HIGH (ref 0.40–1.50)
GFR: 39.29 mL/min — ABNORMAL LOW (ref >60.00)
Glucose, Bld: 84 mg/dL (ref 70–99)
Potassium: 5.1 meq/L (ref 3.5–5.1)
Sodium: 135 meq/L (ref 135–145)
Total Bilirubin: 0.6 mg/dL (ref 0.2–1.2)
Total Protein: 6.7 g/dL (ref 6.0–8.3)

## 2023-12-22 LAB — URINALYSIS, ROUTINE W REFLEX MICROSCOPIC
Bilirubin Urine: NEGATIVE
Hgb urine dipstick: NEGATIVE
Ketones, ur: NEGATIVE
Leukocytes,Ua: NEGATIVE
Nitrite: NEGATIVE
Specific Gravity, Urine: <=1.005 — AB (ref 1.000–1.030)
Total Protein, Urine: NEGATIVE
Urine Glucose: NEGATIVE
Urobilinogen, UA: 0.2 (ref 0.0–1.0)
pH: 5.5 (ref 5.0–8.0)

## 2023-12-22 LAB — MICROALBUMIN / CREATININE URINE RATIO
Creatinine,U: 60.7 mg/dL
Microalb Creat Ratio: 130.7 mg/g — ABNORMAL HIGH (ref 0.0–30.0)
Microalb, Ur: 7.9 mg/dL — ABNORMAL HIGH (ref 0.0–1.9)

## 2023-12-22 LAB — LIPID PANEL
Cholesterol: 207 mg/dL — ABNORMAL HIGH (ref 0–200)
HDL: 91.1 mg/dL (ref >39.00)
LDL Cholesterol: 103 mg/dL — ABNORMAL HIGH (ref 0–99)
NonHDL: 115.87
Total CHOL/HDL Ratio: 2
Triglycerides: 66 mg/dL (ref 0.0–149.0)
VLDL: 13.2 mg/dL (ref 0.0–40.0)

## 2023-12-22 LAB — MAGNESIUM: Magnesium: 2.1 mg/dL (ref 1.5–2.5)

## 2023-12-22 LAB — URIC ACID: Uric Acid, Serum: 7 mg/dL (ref 4.0–7.8)

## 2023-12-22 MED ORDER — DOXYCYCLINE HYCLATE 100 MG PO TABS: 100.0000 mg | ORAL_TABLET | Freq: Two times a day (BID) | ORAL | 0 refills | Status: DC

## 2023-12-22 NOTE — Patient Instructions (Signed)
 VISIT SUMMARY: You had a routine wellness visit to evaluate your prostate health and monitor your PSA levels. We discussed your history of prostate cancer, current management of irritable bowel syndrome (IBS), and other health concerns including chronic kidney disease, colon cancer screening, shingles vaccination, skin fragility, and age-related physical debility.  YOUR PLAN: -PROSTATE CANCER: Prostate cancer is a slow-growing cancer of the prostate gland. Your PSA levels remain low, and you have an MRI scheduled for November 19th to further evaluate your condition. We discussed the limitations of digital rectal exams and the benefits of MRI. A PSA blood test was ordered, and you were referred to Dr. Sharl, a urologist, for further evaluation.  -IRRITABLE BOWEL SYNDROME (IBS): IBS is a gastrointestinal disorder causing alternating episodes of diarrhea and constipation. You are currently managing it with dicyclomine  for pain relief. We discussed dietary modifications and the challenges you face with dietary restrictions. Continue taking dicyclomine  as needed for pain relief.  -CHRONIC KIDNEY DISEASE, STAGE 2 (MILD): Chronic kidney disease is a condition where the kidneys are mildly damaged and not functioning at full capacity. We ordered blood work to monitor your kidney function and obtained a urine sample for further evaluation.  -COLON CANCER SCREENING: Colon cancer screening is important for early detection of colon cancer. We discussed the Cologuard test, which is non-invasive and has a high detection rate. However, insurance coverage may be an issue due to cost. The Cologuard test was ordered.  -SHINGLES VACCINATION: Shingles is a painful rash caused by the reactivation of the chickenpox virus. We recommended the newer shingles vaccine for better efficacy and longer duration. You should obtain the vaccine at a pharmacy due to insurance requirements.  -SKIN FRAGILITY: Skin fragility is common with  aging and can lead to frequent skin tears. We advised using moisturizers to improve your skin condition.  -PHYSICAL DEBILITY, AGE-RELATED: Age-related physical debility can increase the risk of falls. We emphasized the importance of fall prevention strategies to maintain your safety.  INSTRUCTIONS: Please follow up with Dr. Sharl, the urologist, for further evaluation of your prostate cancer. Continue taking dicyclomine  as needed for IBS pain relief. Obtain the shingles vaccine at a pharmacy. Monitor your kidney function with the ordered blood work and urine sample. Complete the Cologuard test for colon cancer screening. Use moisturizers regularly to improve your skin condition. Practice fall prevention strategies to maintain your safety.  Building Your Long-Term Health Plan  During today's preventive visit, we covered a variety of important health checks to help you stay on top of your well-being.  We also discussed strategies to maintain your health and identified some areas that might benefit from further exploration.   Preventive care visits like today's are designed to be proactive, but sometimes additional attention may be needed.  Rest assured, we're here for you.  If these areas require further evaluation or management, we'd be happy to schedule a separate, focused appointment to address them in detail.  Addressing Next Steps  [x]   Follow-up Visit: To ensure we address any unresolved issues and continue monitoring your overall health, we recommend scheduling a follow-up appointment in 1 year for your next preventive care visit. If you experience any new problems, need to discuss any medical concerns, or your condition worsens before then, please don't hesitate to call our office to schedule an appointment or seek emergency care as needed.  [x]   Preventive Measures: Maintaining healthy habits plays a crucial role in overall wellness. We recommend considering these tips: [x]   Regular  appointments with dental and vision professionals [x]   Nightly nasal saline mist to keep sinuses clear [x]   Consistent toothbrushing to maintain oral health [x]   Using an app like [employer]oreLab to track sleep quality [x]   Routine checks of blood pressure and heart rate [x]   Medical Information: In some instances, we may require additional medical information from other providers to create a comprehensive picture of your health. If applicable, we can provide a medical information release form at the front desk for you to sign, allowing us  to gather these records. [x]   Lab Tests: If any lab tests were ordered today, scheduling them within a week of your visit helps ensure the best possible insurance coverage.  Planning Follow Up to Work on a Problem? Make the Most of Our Focused (20 minute) Appointments  [x]   Clearly state your top concerns at the beginning of the visit to focus our discussion [x]   If you anticipate you will need more time, please inform the front desk during scheduling - we can book multiple appointments in the same week. [x]   If you have transportation problems- use our convenient video appointments or ask about transportation support. [x]   We can get down to business faster if you use MyChart to update information before the visit and submit non-urgent questions before your visit. Thank you for taking the time to provide details through MyChart.  Let our nurse know and she can import this information into your encounter documents.  Arrival and Wait Times  [x]   Arriving on time ensures that everyone receives prompt attention. [x]   Early morning (8a) and afternoon (1p) appointments tend to have shortest wait times. [x]   Unfortunately, we cannot delay appointments for late arrivals or hold slots during phone calls.  Bring to Your Next Appointment:  [x]   Medications: Please bring all your medication bottles to your next appointment to ensure we have an accurate record of your  prescriptions. [x]   Health Diaries: If you're monitoring any health conditions at home, keeping a diary of your readings can be very helpful for discussions at your next appointment.  Reviewing Your Records  [x]   Review your attached preventive care information at the end of these patient instructions. [x]   Review this early draft of your clinical encounter notes below and the final encounter summary tomorrow on MyChart after its been completed.      Getting Answers and Following Up  [x]   Simple Questions & Concerns: For quick questions or basic follow-up after your visit, reach us  at [phone] or MyChart messaging. [x]   Complex Concerns: If your concern is more complex, scheduling an appointment might be best. Discuss this with the staff to find the most suitable option. [x]   Lab & Imaging Results: We'll contact you directly if results are abnormal or you don't use MyChart. Most normal results will be on MyChart within 2-3 business days, with a review message from Dr. Jesus. Haven't heard back in 2 weeks? Need results sooner? Contact us  at [phone]. [x]   Referrals: Our referral coordinator will manage specialist referrals. The specialist's office should contact you within 2 weeks to schedule an appointment. Call us  if you haven't heard from them after 2 weeks.  Staying Connected  [x]   MyChart: Activate your MyChart for the fastest way to access results and message us . See the last page of this paperwork for instructions on how to activate.  Billing  [x]   X-ray & Lab Orders: These are billed by separate companies. Contact the invoicing company  directly for questions or concerns. [x]   Visit Charges: Discuss any billing inquiries with our administrative services team.  Your Satisfaction Matters  [x]   Share Your Experience: We strive for your satisfaction! If you have any complaints, or preferably compliments, please let Dr. Jesus know directly or contact our Practice  Administrators, Manuelita Rubin or [employer], by asking at the front desk.                 Next Steps  [x]   Schedule Follow-Up:  We recommend a follow-up appointment in 1 year for your next wellness visit.  If you develop any new problems, want to address any medical issues, or your condition worsens before then, please call us  for an appointment or seek emergency care. [x]   Preventive Care:  Make sure to keep regular appointments with dental and vision professionals, use nightly nasal saline mist sprays to keep your sinuses clear and toothbrushing to protect your teeth. Use [employer]ore[employer] App or other app to track your sleep quality. Check blood pressure and heart rate routinely. [x]   Medical Information Release:  For any relevant medical information we don't have, please sign a release form at the front desk so we can obtain it for your records. [x]   Lab Tests:  Schedule any lab tests from today for within a week to ensure best insurance coverage.    Making the Most of Our Focused (20 minute) Appointments:  [x]   Clearly state your top concerns at the beginning of the visit to focus our discussion [x]   If you anticipate you will need more time, please inform the front desk during scheduling - we can book multiple appointments in the same week. [x]   If you have transportation problems- use our convenient video appointments or ask about transportation support. [x]   We can get down to business faster if you use MyChart to update information before the visit and submit non-urgent questions before your visit. Thank you for taking the time to provide details through MyChart.  Let our nurse know and she can import this information into your encounter documents.  Arrival and Wait Times: [x]   Arriving on time ensures that everyone receives prompt attention. [x]   Early morning (8a) and afternoon (1p) appointments tend to have shortest wait times. [x]   Unfortunately, we cannot delay appointments  for late arrivals or hold slots during phone calls.  Bring to Your Next Appointment  [x]   Medications: Please bring all your medication bottles to your next appointment to ensure we have an accurate record of your prescriptions. [x]   Health Diaries: If you're monitoring any health conditions at home, keeping a diary of your readings can be very helpful for discussions at your next appointment.  Reviewing Your Records  [x]   Review your attached preventive care information at the end of these patient instructions. [x]   Review this early draft of your clinical encounter notes below and the final encounter summary tomorrow on MyChart after its been completed.   Encounter for annual general medical examination with abnormal findings in adult  Chronic kidney disease (CKD), active medical management without dialysis, stage 2 (mild) -     Protein / creatinine ratio, urine -     Microalbumin / creatinine urine ratio -     Magnesium -     Urinalysis, Routine w reflex microscopic -     VITAMIN D  25 Hydroxy (Vit-D Deficiency, Fractures) -     PTH, intact and calcium  -     Uric acid -  PSA -     Lipid panel -     CBC with Differential/Platelet -     Comprehensive metabolic panel with GFR  Prostate cancer (HCC) -     PSA  Screening for malignant neoplasm of colon -     Cologuard  Irritable bowel syndrome with diarrhea  Mixed hyperlipidemia  Frailty syndrome in geriatric patient -     VITAMIN D  25 Hydroxy (Vit-D Deficiency, Fractures)  Stage 3b chronic kidney disease (HCC) -     VITAMIN D  25 Hydroxy (Vit-D Deficiency, Fractures)  Ambulates with cane -     VITAMIN D  25 Hydroxy (Vit-D Deficiency, Fractures)  Incontinence of feces with fecal urgency  Cellulitis of left lower extremity -     Doxycycline Hyclate; Take 1 tablet (100 mg total) by mouth 2 (two) times daily.  Dispense: 20 tablet; Refill: 0  Need for vaccination  ISTAP type 1 skin tear of left lower leg  Frailty      Getting Answers and Following Up  [x]   Simple Questions & Concerns: For quick questions or basic follow-up after your visit, reach us  at [phone] or MyChart messaging. [x]   Complex Concerns: If your concern is more complex, scheduling an appointment might be best. Discuss this with the staff to find the most suitable option. [x]   Lab & Imaging Results: We'll contact you directly if results are abnormal or you don't use MyChart. Most normal results will be on MyChart within 2-3 business days, with a review message from Dr. Jesus. Haven't heard back in 2 weeks? Need results sooner? Contact us  at [phone]. [x]   Referrals: Our referral coordinator will manage specialist referrals. The specialist's office should contact you within 2 weeks to schedule an appointment. Call us  if you haven't heard from them after 2 weeks.  Staying Connected  [x]   MyChart: Activate your MyChart for the fastest way to access results and message us . See the last page of this paperwork for instructions on how to activate.  Billing  [x]   X-ray & Lab Orders: These are billed by separate companies. Contact the invoicing company directly for questions or concerns. [x]   Visit Charges: Discuss any billing inquiries with our administrative services team.  Your Satisfaction Matters  [x]   Share Your Experience: We strive for your satisfaction! If you have any complaints, or preferably compliments, please let Dr. Jesus know directly or contact our Practice Administrators, Manuelita Rubin or [employer], by asking at the front desk.    Medical Screening Exam A medical screening exam (MSE) helps to determine whether you need immediate medical treatment relating to any number of symptoms you are having. This type of exam may be done in an emergency department, an urgent care setting, or your health care provider's office. Depending on your symptoms and severity, you may need additional tests or medical  therapy. It is important to note that an MSE does not necessarily mean that you will need or receive further medical testing or interventions if your symptoms are not deemed to be medically urgent (emergent). Tell a health care provider about: Any allergies you have. All medicines you are taking, including vitamins, herbs, eye drops, creams, and over-the-counter medicines. Any problems you or family members have had with anesthetic medicines. Any bleeding problems you have. Any surgeries you have had. Any medical conditions you have. Whether you are pregnant or may be pregnant. What happens during the test? During the exam, a health care provider does a short, often focused,  physical exam and asks about your medical history to assess: Your current symptoms. Your overall health. Your need for possible further medical intervention. What can I expect after the test? If you have a regular health care provider, make an appointment for a follow-up visit with him or her. If you do not have a regular health care provider, ask about resources in your community. Your medical screening exam may determine that: You do not need emergency treatment at this time. You need treatment right away. You need to be transferred to another medical center. This may happen if you need an emergent specialist or consultant that is not available at the medical center you are at. You need to have more tests. A medical specialist may be consulted if needed. Get help right away if: Your condition gets worse. You develop new or troubling symptoms before you see your health care provider. These symptoms may represent a serious problem that is an emergency. Do not wait to see if the symptoms will go away. Get medical help right away. Call your local emergency services (911 in the [place] Do not drive yourself to the hospital. Summary A medical screening exam helps to determine whether you need medical treatment right away.  This type of exam may be done in an emergency department, an urgent care setting, or your health care provider's office. During the exam, a health care provider does a short physical exam and asks about your current symptoms and overall health. Depending on the exam, more tests or therapies may be ordered. However, an MSE does not necessarily mean that you will have further medical testing if your symptoms are not deemed to be urgent. If you need further care that is not offered at your current medical center, you may need to be transferred to another facility. This information is not intended to replace advice given to you by your health care provider. Make sure you discuss any questions you have with your health care provider. Document Revised: 10/16/2020 Document Reviewed: 06/13/2020 Elsevier Patient Education  2024 [employer].

## 2023-12-22 NOTE — Assessment & Plan Note (Signed)
 Photographs Taken 12/22/2023 :

## 2023-12-22 NOTE — Assessment & Plan Note (Signed)
 Irritable bowel syndrome with diarrhea and constipation   He experiences IBS with alternating diarrhea and constipation, currently managed with dicyclomine  for pain relief. Dietary modifications and the challenges of dietary restrictions were discussed. He should continue dicyclomine  as needed for pain relief.

## 2023-12-22 NOTE — Progress Notes (Signed)
 [facility] [address] [place], [place] [address] Office:  [phone]  -- Annual Preventive Medical Office Visit --  Patient:  Eugene Friedman      Age: 89 y.o.       Sex:  male  Date:   12/22/2023 Patient Care Team: Jesus Bernardino MATSU, MD as PCP - General (Internal Medicine) Camillo Golas, MD as Consulting Physician (Ophthalmology) Nahser, Aleene PARAS, MD (Inactive) as Consulting Physician (Cardiology) Eletha Boas, MD as Consulting Physician (General Surgery) Devere Lonni Righter, MD as Consulting Physician (Urology) Lavella Evalene Dover, MD as Referring Physician (Cardiology) Today's Healthcare Provider: Bernardino MATSU Jesus, MD  ========================================= Chief complaint: Labs Only (Pt would like psa check today has referral in for urology in already daughter states. Has MRI for nov 19th 2025)  Purpose of Visit: Comprehensive preventive health assessment and personalized health maintenance planning.  This encounter was conducted as a Comprehensive Physical Exam (CPE) preventive care annual visit. The patient's medical history and problem list were reviewed to inform individualized preventive care recommendations.   No problem-specific medical treatment was provided during this visit.  Assessment & Plan Chronic kidney disease (CKD), active medical management without dialysis, stage 2 (mild) Mild chronic kidney disease requires monitoring. Blood work was ordered to monitor kidney function, and a urine sample was obtained for further evaluation. Prostate cancer [facility]) Prostate cancer, status post prior diagnosis, under surveillance   Prostate cancer remains under surveillance with low PSA levels. An MRI is scheduled for November 19th to further evaluate the condition. The limitations of digital rectal exams and the benefits of MRI were discussed, emphasizing the slow progression of the cancer. A PSA blood test was ordered, and he was  referred to urologist Dr. Sharl for further evaluation. Encounter for annual general medical examination with abnormal findings in adult Screening for malignant neoplasm of colon During the routine wellness visit, a physical examination was performed. General health maintenance, including diet and fall prevention, was discussed. The Cologuard test for colon cancer screening was discussed due to age-related risks of colonoscopy. The test's 99.99% detection rate and non-invasive nature were explained, though insurance coverage may be an issue due to cost. The Cologuard test was ordered. Irritable bowel syndrome with diarrhea Irritable bowel syndrome with diarrhea and constipation   He experiences IBS with alternating diarrhea and constipation, currently managed with dicyclomine  for pain relief. Dietary modifications and the challenges of dietary restrictions were discussed. He should continue dicyclomine  as needed for pain relief. Mixed hyperlipidemia  Frailty syndrome in geriatric patient  Stage 3b chronic kidney disease (HCC)  Ambulates with cane  Incontinence of feces with fecal urgency  Cellulitis of left lower extremity ISTAP type 1 skin tear of left lower leg Photographs Taken 12/22/2023 :    Need for vaccination The newer shingles vaccine was recommended for better efficacy and longer duration. He was advised to obtain the vaccine at a pharmacy due to insurance requirements. Frailty Age-related physical debility was noted, with an emphasis on fall prevention as a major health concern. He was counseled on fall prevention strategies.     ICD-10-CM   1. Encounter for annual general medical examination with abnormal findings in adult  Z00.01     2. Chronic kidney disease (CKD), active medical management without dialysis, stage 2 (mild)  N18.2 Protein / creatinine ratio, urine    Microalbumin / creatinine urine ratio    Magnesium    Urinalysis, Routine w reflex microscopic  VITAMIN D  25 Hydroxy (Vit-D Deficiency, Fractures)    PTH, intact and calcium     Uric acid    PSA    Lipid panel    CBC with Differential/Platelet    Comp Met (CMET)    3. Prostate cancer (HCC)  C61 PSA    4. Screening for malignant neoplasm of colon  Z12.11 Cologuard    5. Irritable bowel syndrome with diarrhea  K58.0     6. Mixed hyperlipidemia  E78.2     7. Frailty syndrome in geriatric patient  R54 VITAMIN D  25 Hydroxy (Vit-D Deficiency, Fractures)    8. Stage 3b chronic kidney disease (HCC)  N18.32 VITAMIN D  25 Hydroxy (Vit-D Deficiency, Fractures)    9. Ambulates with cane  Z99.89 VITAMIN D  25 Hydroxy (Vit-D Deficiency, Fractures)    10. Incontinence of feces with fecal urgency  R15.9    R15.2     11. Cellulitis of left lower extremity  L03.116 doxycycline (VIBRA-TABS) 100 MG tablet    12. Need for vaccination  Z23     13. ISTAP type 1 skin tear of left lower leg  S81.812A     14. Frailty  R54      Reviewed/updated/encouraged completion: Immunization History  Administered Date(s) Administered   H1N1 02/17/2007   INFLUENZA, HIGH DOSE SEASONAL PF 11/14/2013, 11/20/2014, 12/16/2015, 10/30/2016, 02/04/2018, 11/22/2018, 10/18/2019, 11/23/2019, 12/09/2020, 11/20/2021, 12/15/2022, 11/10/2023   Influenza Whole 12/05/2012   Influenza-Unspecified 12/05/2012   PFIZER Comirnaty (Gray Top)Covid-19 Tri-Sucrose Vaccine 04/06/2020   PFIZER(Purple Top)SARS-COV-2 Vaccination 03/09/2019, 03/30/2019, 11/07/2019   Pfizer Covid-19 Vaccine Bivalent Booster 88yrs & up 11/04/2020, 06/14/2021   Pfizer(Comirnaty )Fall Seasonal Vaccine 12 years and older 12/31/2021, 06/03/2022, 10/28/2022, 05/26/2023, 12/02/2023   Pneumococcal Conjugate-13 12/05/2014   Pneumococcal Polysaccharide-23 03/08/2008, 05/18/2013   Respiratory Syncytial Virus Vaccine ,Recomb Aduvanted(Arexvy ) 01/29/2022   Td 06/14/2019   Tdap 02/17/2008, 02/11/2021   Zoster, Live 02/16/2005   Health Maintenance Due  Topic Date  Due   Zoster Vaccines- Shingrix (1 of 2) 08/10/1947   Health Maintenance  Topic Date Due   Zoster Vaccines- Shingrix (1 of 2) 08/10/1947   Medicare Annual Wellness (AWV)  03/16/2024   COVID-19 Vaccine (12 - Pfizer risk 2025-26 season) 06/01/2024   DTaP/Tdap/Td (4 - Td or Tdap) 02/12/2031   Pneumococcal Vaccine: 50+ Years  Completed   Influenza Vaccine  Completed   Meningococcal B Vaccine  Aged Out    Reviewed the following verbally with patient and provided AVS materials:   HEALTH MAINTENANCE COUNSELING AND ANTICIPATORY GUIDANCE    Preventive Measure Recommendation  Eye Exams Every 1-2 years  Dental Care Cleanings every 6 months or more, brush/floss 3x daily  Diet Fruits/vegetables/fiber/healthy fats, balance and moderation  Exercise 150 minutes weekly  Risk Behaviors Discouraged any/all high risk behaviors-mainly concerned with fall risks    CANCER SCREENING SHARED DECISION MAKING    Penile/Testicle/Scrotum Encouraged self-monitoring and reporting of genital abnormalities. Patient reports none.  Thyroid  Thyroid  was palpated for nodules today  Prostate Recently seen for urology for rising PSA.   5.5 -> 7  Lab Results  Component Value Date   PSA 8.48 (H) 05/31/2023   PSA 4.94 (H) 12/15/2022   PSA 6.04 (H) 12/09/2021   Planned for MRI.  Following with urology Lab Results  Component Value Date   PSA 8.48 (H) 05/31/2023   PSA 4.94 (H) 12/15/2022   PSA 6.04 (H) 12/09/2021   PSA 4.52 (H) 06/11/2021   PSA 4.09 (H) 03/06/2021   PSA 4.52 (H) 12/09/2020  PSA 3.8 04/27/2019   PSA 3.0 10/26/2018   PSA 3.2 03/17/2017   PSA 3.7 10/30/2016    Colon HM Colonoscopy   This patient has no relevant Health Maintenance data.   Dicyclomine  and diet changes helps IBS.      Lung Current guidelines recommend individuals aged 57 to 23 who currently smoke or formerly smoked and have a >= 20 pack-year smoking history should undergo annual screening with low-dose computed tomography  (LDCT). Tobacco Use: Low Risk  (12/22/2023)   Patient History    Smoking Tobacco Use: Never    Smokeless Tobacco Use: Never    Passive Exposure: Not on file   Social History   Tobacco Use  Smoking Status Never  Smokeless Tobacco Never    Skin Advised regular sunscreen use. Patient denies worrisome, changing, or new skin lesions. Offered to include images in chart for surveillance. Showed patient these pictures of melanomas for reference to educate for self-monitoring.   e transcription with the patient, who gave verbal consent to proceed. Photographs Taken 12/22/2023 :    History of Present Illness 89 year old male with a history of prostate cancer who presents for evaluation of prostate health and PSA monitoring.  He was diagnosed with prostate cancer approximately twenty years ago following a biopsy due to elevated PSA levels. Since then, he has been monitoring his PSA levels and is scheduled for an MRI on January 05, 2024, to further evaluate his prostate health. He prefers hands-on examinations but understands the limitations of such exams.  He is currently taking lisaclomine for pain management related to his irritable bowel syndrome (IBS), which causes alternating episodes of diarrhea and constipation. He faces dietary challenges, enjoying fish and potatoes while trying to avoid cabbage, but he has not strictly adhered to dietary recommendations due to social and personal preferences.  He is also considering a Cologuard test for colon cancer screening, as he is beyond the age for routine colonoscopy.  He has received all necessary vaccines, including the RSV vaccine in 2023, and is considering updating his shingles vaccine. No chest pain, but he experiences occasional shoulder pain. He also has thin skin that tears easily, which he attributes to aging.  ROS A comprehensive ROS was negative for any concerning symptoms.   Completed medication reconciliation: Current Outpatient  Medications on File Prior to Visit  Medication Sig   doxazosin  (CARDURA ) 4 MG tablet Take 4 mg by mouth.   finasteride  (PROSCAR ) 5 MG tablet Take 5 mg by mouth.   amoxicillin -clavulanate (AUGMENTIN ) 875-125 MG tablet Take 1 tablet by mouth 2 (two) times daily. (Patient not taking: Reported on 11/10/2023)   Cholecalciferol (VITAMIN D -3) 125 MCG (5000 UT) TABS Take 1 tablet by mouth daily at 6 (six) AM.   dicyclomine  (BENTYL ) 20 MG tablet TAKE 1 TABLET BY MOUTH 3 TO 4 TIMES PER DAY BEFORE MEALS AND AT BEDTIME AS NEEDED FOR NAUSEA OR CRAMPING OR BLOATING OR DIARRHEA   diltiazem  (CARDIZEM  CD) 120 MG 24 hr capsule Take  1 capsule  Daily  with Food for BP & Heart Rhythm   dutasteride  (AVODART ) 0.5 MG capsule TAKE 1 CAPSULE(0.5 MG) BY MOUTH DAILY. REPLACES FINASTERIDE    ezetimibe  (ZETIA ) 10 MG tablet Take 1 tablet (10 mg total) by mouth daily.   flecainide  (TAMBOCOR ) 50 MG tablet Take 1 & 1/2  tablet (75 mg)   2 x /day  (every 12 hours )        for Afib   Multiple Vitamin (MULTIVITAMIN)  tablet Take 1 tablet by mouth daily.   neomycin -polymyxin-dexamethasone  (MAXITROL) 0.1 % ophthalmic suspension Apply 1 drop to eye 4 (four) times daily.   neomycin -polymyxin-hydrocortisone (CORTISPORIN) 3.5-10000-1 ophthalmic suspension 4 (four) times daily.   polyethylene glycol (MIRALAX  / GLYCOLAX ) 17 g packet Take 17 g by mouth daily.   rifaximin  (XIFAXAN ) 550 MG TABS tablet Take 1 tablet (550 mg total) by mouth 3 (three) times daily.   Rivaroxaban  (XARELTO ) 15 MG TABS tablet Take  1 tablet  Daily  for Atrial Fibrillation & to Prevent Blood Clots   tamsulosin  (FLOMAX ) 0.4 MG CAPS capsule Take 1 capsule (0.4 mg total) by mouth daily. Must monitor blood pressure after to make sure tolerated (lowers blood pressure)   No current facility-administered medications on file prior to visit.  There are no discontinued medications.The following were reviewed and/or entered/updated into our electronic MEDICAL RECORD NUMBERPast Medical  History:  Diagnosis Date   Allergic rhinitis    Atrial fibrillation by electrocardiogram (HCC) 10/2018   Was started on calcium  channel blocker, beta-blocker and Xarelto    Cancer (HCC)    prostate   DJD (degenerative joint disease)    Hyperlipidemia    Hypertension    Borderline    Neuromuscular disorder (HCC)    Sinus bradycardia    Spinal stenosis    Complicated by dural AVM   Vitamin D  deficiency    Past Surgical History:  Procedure Laterality Date   APPENDECTOMY  1956   Back Procedure  11/02/2018   [facility] -vascular neuroradiology occlusion of dural AVM   cervical neck mass   07/08/2006   Left Excision left posterior cervical neck mass (2.5 cm). SURGEON:  Krystal EMERSON Spinner, M.D., FACS 07/08/06 and 12/05/07   FLEXIBLE SIGMOIDOSCOPY     HEMORRHOID SURGERY     MASTOIDECTOMY Left 1936   MEDIAL PARTIAL KNEE REPLACEMENT Left 2013   Social History   Socioeconomic History   Marital status: Widowed    Spouse name: Not on file   Number of children: 2   Years of education: Not on file   Highest education level: Not on file  Occupational History   Occupation: RETIRED    Employer: [employer]    Comment: PRESIDENT  Tobacco Use   Smoking status: Never   Smokeless tobacco: Never  Vaping Use   Vaping status: Never Used  Substance and Sexual Activity   Alcohol use: Yes    Alcohol/week: 10.0 standard drinks of alcohol    Types: 10 Glasses of wine per week   Drug use: No   Sexual activity: Not on file  Other Topics Concern   Not on file  Social History Narrative   Left handed   Caffeine 1.5 cups daily    Lives at home alone Wife passed.    Accompanied by his daughter.   Native Swedish -> speaks English well   Social Drivers of Corporate Investment Banker Strain: Not on file  Food Insecurity: Not on file  Transportation Needs: Not on file  Physical Activity: Not on file  Stress: Not on file  Social Connections: Not on file  Intimate Partner Violence: Not on file     Family History  Problem Relation Age of Onset   Stroke Mother    Diabetes Mother    Heart attack Father 54   Hypertension Father    Diabetes Maternal Aunt    Diabetes Paternal Aunt    Cancer Maternal Grandmother    Allergies  Allergen Reactions  Celebrex [Celecoxib] Other (See Comments)    GI upset   Lipitor [Atorvastatin]    Ppd [Tuberculin Purified Protein Derivative] Other (See Comments)    Positive PPD   Adhesive [Tape] Rash    Zinc in the band-aid will cause a rash   Nickel Rash   Social History   Substance and Sexual Activity  Sexual Activity Not on file  @    12/22/2023    9:41 AM  Depression screen PHQ 2/9  Decreased Interest 0  Down, Depressed, Hopeless 0  PHQ - 2 Score 0      12/15/2022   12:25 AM  Fall Risk   Falls in the past year? 0  Risk for fall due to : No Fall Risks  Follow up Falls prevention discussed;Education provided;Falls evaluation completed     BP 138/70   Pulse 66   Temp 98 F (36.7 C) (Temporal)   Ht 6' 1 (1.854 m)   Wt 194 lb (88 kg)   SpO2 98%   BMI 25.60 kg/m  BP Readings from Last 3 Encounters:  12/22/23 138/70  11/10/23 132/72  10/20/23 138/80   Wt Readings from Last 10 Encounters:  12/22/23 194 lb (88 kg)  11/10/23 195 lb 6.4 oz (88.6 kg)  10/20/23 197 lb (89.4 kg)  05/31/23 195 lb 9.6 oz (88.7 kg)  03/17/23 200 lb 6.4 oz (90.9 kg)  12/15/22 202 lb 3.2 oz (91.7 kg)  06/12/22 198 lb 9.6 oz (90.1 kg)  06/08/22 202 lb (91.6 kg)  03/11/22 198 lb 3.2 oz (89.9 kg)  02/03/22 206 lb (93.4 kg)  Physical Exam  GEN: No acute distress, resting comfortably. HEENT: Tympanic membranes normal appearing bilaterally, oropharynx clear, no thyromegaly noted, no palpable lymphadenopathy or thyroid  nodules. Severe hearing loss CARDIOVASCULAR: S1 and S2 heart sounds with regular rate and rhythm, no murmurs appreciated. PULMONARY: Normal work of breathing, clear to auscultation bilaterally, no crackles, wheezes, or  rhonchi. ABDOMEN: Soft, nontender, nondistended. MSK: No edema, cyanosis, or clubbing noted. SKIN: Warm, dry, no lesions of concern observed other than the shin skin tear NEUROLOGICAL: Cranial nerves II-XII grossly intact, strength 5/5 in upper and lower extremities, reflexes symmetric and intact bilaterally. PSYCH: Normal affect and thought content, pleasant and cooperative.      ======================================  IMPORTANT HEALTH REMINDERS: Report any new or changing skin lesions promptly Maintain recommended screening schedules Discuss any new family history of cancer at future visits Follow up on any new symptoms that persist more than two weeks      Notes:  This document was synthesized by artificial intelligence (Abridge) using HIPAA-compliant recording of the clinical interaction;   We discussed the use of AI scribe software for clinical note transcription with the patient, who gave verbal consent to proceed.    This encounter employed state-of-the-art, real-time, collaborative documentation. The patient was empowered to actively review and assist in updating their electronic medical record on a shared monitor, ensuring transparency and improving accuracy.    Prior to and at the beginning of Comprehensive Physical Exam (CPE) preventive care annual visit appointment types  we clarify to patients Our goal today is to focus on your preventive or annual Comprehensive Physical Exam (CPE) preventive care annual visit, which typically covers routine screenings and overall health maintenance. However, if you share any new or concerning symptoms--such as dizziness, passing out, severe pain, or anything else that may point to a more serious issue--we are both legally and ethically required to evaluate it. We cannot simply overlook  or ignore such concerns, even if you later decide you don't want to discuss them, because it could jeopardize your health.  If addressing a new concern takes  us  beyond the scope of the preventive visit, we may need to bill separately for that portion of care. We understand financial considerations are important, and we're happy to discuss your options if something new comes up. However, we want to be clear that once you mention a potentially serious issue, we must investigate it; we can't ethically or legally exclude that from our records or our evaluation. Please let us  know all of your questions or worries. Together, we can decide how best to manage them and how to minimize any unexpected costs, but we want to keep you safe above all else.   This disclosure is mandated by professional ethics and legal obligations, as healthcare providers must address any substantial health concerns raised during any patient interaction and a comprehensive ROS is required by insurance companies for billing preventive-care visit type.   This disclosure ultimately discourages patients financially from reporting significant health issues.   Medical Screening Exam A medical screening exam (MSE) helps to determine whether you need immediate medical treatment relating to any number of symptoms you are having. This type of exam may be done in an emergency department, an urgent care setting, or your health care provider's office. Depending on your symptoms and severity, you may need additional tests or medical therapy. It is important to note that an MSE does not necessarily mean that you will need or receive further medical testing or interventions if your symptoms are not deemed to be medically urgent (emergent). Tell a health care provider about: Any allergies you have. All medicines you are taking, including vitamins, herbs, eye drops, creams, and over-the-counter medicines. Any problems you or family members have had with anesthetic medicines. Any bleeding problems you have. Any surgeries you have had. Any medical conditions you have. Whether you are pregnant or may be  pregnant. What happens during the test? During the exam, a health care provider does a short, often focused, physical exam and asks about your medical history to assess: Your current symptoms. Your overall health. Your need for possible further medical intervention. What can I expect after the test? If you have a regular health care provider, make an appointment for a follow-up visit with him or her. If you do not have a regular health care provider, ask about resources in your community. Your medical screening exam may determine that: You do not need emergency treatment at this time. You need treatment right away. You need to be transferred to another medical center. This may happen if you need an emergent specialist or consultant that is not available at the medical center you are at. You need to have more tests. A medical specialist may be consulted if needed. Get help right away if: Your condition gets worse. You develop new or troubling symptoms before you see your health care provider. These symptoms may represent a serious problem that is an emergency. Do not wait to see if the symptoms will go away. Get medical help right away. Call your local emergency services (911 in the [place] Do not drive yourself to the hospital. Summary A medical screening exam helps to determine whether you need medical treatment right away. This type of exam may be done in an emergency department, an urgent care setting, or your health care provider's office. During the exam, a health care provider does a  short physical exam and asks about your current symptoms and overall health. Depending on the exam, more tests or therapies may be ordered. However, an MSE does not necessarily mean that you will have further medical testing if your symptoms are not deemed to be urgent. If you need further care that is not offered at your current medical center, you may need to be transferred to another facility. This  information is not intended to replace advice given to you by your health care provider. Make sure you discuss any questions you have with your health care provider. Document Revised: 10/16/2020 Document Reviewed: 06/13/2020 Elsevier Patient Education  2024 Elsevier Inc.   Health Maintenance, Male Adopting a healthy lifestyle and getting preventive care are important in promoting health and wellness. Ask your health care provider about: The right schedule for you to have regular tests and exams. Things you can do on your own to prevent diseases and keep yourself healthy. What should I know about diet, weight, and exercise? Eat a healthy diet  Eat a diet that includes plenty of vegetables, fruits, low-fat dairy products, and lean protein. Do not eat a lot of foods that are high in solid fats, added sugars, or sodium. Maintain a healthy weight Body mass index (BMI) is a measurement that can be used to identify possible weight problems. It estimates body fat based on height and weight. Your health care provider can help determine your BMI and help you achieve or maintain a healthy weight. Get regular exercise Get regular exercise. This is one of the most important things you can do for your health. Most adults should: Exercise for at least 150 minutes each week. The exercise should increase your heart rate and make you sweat (moderate-intensity exercise). Do strengthening exercises at least twice a week. This is in addition to the moderate-intensity exercise. Spend less time sitting. Even light physical activity can be beneficial. Watch cholesterol and blood lipids Have your blood tested for lipids and cholesterol at 88 years of age, then have this test every 5 years. You may need to have your cholesterol levels checked more often if: Your lipid or cholesterol levels are high. You are older than 88 years of age. You are at high risk for heart disease. What should I know about cancer  screening? Many types of cancers can be detected early and may often be prevented. Depending on your health history and family history, you may need to have cancer screening at various ages. This may include screening for: Colorectal cancer. Prostate cancer. Skin cancer. Lung cancer. What should I know about heart disease, diabetes, and high blood pressure? Blood pressure and heart disease High blood pressure causes heart disease and increases the risk of stroke. This is more likely to develop in people who have high blood pressure readings or are overweight. Talk with your health care provider about your target blood pressure readings. Have your blood pressure checked: Every 3-5 years if you are 74-84 years of age. Every year if you are 49 years old or older. If you are between the ages of 45 and 1 and are a current or former smoker, ask your health care provider if you should have a one-time screening for abdominal aortic aneurysm (AAA). Diabetes Have regular diabetes screenings. This checks your fasting blood sugar level. Have the screening done: Once every three years after age 15 if you are at a normal weight and have a low risk for diabetes. More often and at a younger age  if you are overweight or have a high risk for diabetes. What should I know about preventing infection? Hepatitis B If you have a higher risk for hepatitis B, you should be screened for this virus. Talk with your health care provider to find out if you are at risk for hepatitis B infection. Hepatitis C Blood testing is recommended for: Everyone born from [birth year] through 1963. Anyone with known risk factors for hepatitis C. Sexually transmitted infections (STIs) You should be screened each year for STIs, including gonorrhea and chlamydia, if: You are sexually active and are younger than 88 years of age. You are older than 88 years of age and your health care provider tells you that you are at risk for this type of  infection. Your sexual activity has changed since you were last screened, and you are at increased risk for chlamydia or gonorrhea. Ask your health care provider if you are at risk. Ask your health care provider about whether you are at high risk for HIV. Your health care provider may recommend a prescription medicine to help prevent HIV infection. If you choose to take medicine to prevent HIV, you should first get tested for HIV. You should then be tested every 3 months for as long as you are taking the medicine. Follow these instructions at home: Alcohol use Do not drink alcohol if your health care provider tells you not to drink. If you drink alcohol: Limit how much you have to 0-2 drinks a day. Know how much alcohol is in your drink. In the [place] one drink equals one 12 oz bottle of beer (355 mL), one 5 oz glass of wine (148 mL), or one 1 oz glass of hard liquor (44 mL). Lifestyle Do not use any products that contain nicotine or tobacco. These products include cigarettes, chewing tobacco, and vaping devices, such as e-cigarettes. If you need help quitting, ask your health care provider. Do not use street drugs. Do not share needles. Ask your health care provider for help if you need support or information about quitting drugs. General instructions Schedule regular health, dental, and eye exams. Stay current with your vaccines. Tell your health care provider if: You often feel depressed. You have ever been abused or do not feel safe at home. Summary Adopting a healthy lifestyle and getting preventive care are important in promoting health and wellness. Follow your health care provider's instructions about healthy diet, exercising, and getting tested or screened for diseases. Follow your health care provider's instructions on monitoring your cholesterol and blood pressure. This information is not intended to replace advice given to you by your health care provider. Make sure you discuss  any questions you have with your health care provider. Document Revised: 06/24/2020 Document Reviewed: 06/24/2020 Elsevier Patient Education  2024 [employer].

## 2023-12-22 NOTE — Assessment & Plan Note (Signed)
 Mild chronic kidney disease requires monitoring. Blood work was ordered to monitor kidney function, and a urine sample was obtained for further evaluation.

## 2023-12-22 NOTE — Assessment & Plan Note (Signed)
 Prostate cancer, status post prior diagnosis, under surveillance   Prostate cancer remains under surveillance with low PSA levels. An MRI is scheduled for November 19th to further evaluate the condition. The limitations of digital rectal exams and the benefits of MRI were discussed, emphasizing the slow progression of the cancer. A PSA blood test was ordered, and he was referred to urologist Dr. Sharl for further evaluation.

## 2023-12-22 NOTE — Assessment & Plan Note (Signed)
 The newer shingles vaccine was recommended for better efficacy and longer duration. He was advised to obtain the vaccine at a pharmacy due to insurance requirements.

## 2023-12-23 ENCOUNTER — Encounter: Payer: Medicare Other | Admitting: Internal Medicine

## 2023-12-23 LAB — PROTEIN / CREATININE RATIO, URINE
Creatinine, Urine: 65 mg/dL (ref 20–320)
Protein/Creat Ratio: 246 mg/g{creat} — ABNORMAL HIGH (ref 25–148)
Protein/Creatinine Ratio: 0.246 mg/mg{creat} — ABNORMAL HIGH (ref 0.025–0.148)
Total Protein, Urine: 16 mg/dL (ref 5–25)

## 2023-12-23 LAB — VITAMIN D 25 HYDROXY (VIT D DEFICIENCY, FRACTURES): VITD: 76.54 ng/mL (ref 30.00–100.00)

## 2023-12-23 LAB — PSA: PSA: 6.86 ng/mL — ABNORMAL HIGH (ref 0.10–4.00)

## 2023-12-25 LAB — PTH, INTACT AND CALCIUM
Calcium: 9.7 mg/dL (ref 8.6–10.3)
PTH: 42 pg/mL (ref 16–77)

## 2023-12-26 ENCOUNTER — Ambulatory Visit: Payer: Self-pay | Admitting: Internal Medicine

## 2023-12-27 NOTE — Telephone Encounter (Signed)
 read by Debbrah KATHEE Eric at 7:59PM on 12/26/2023.

## 2024-01-05 ENCOUNTER — Ambulatory Visit
Admission: RE | Admit: 2024-01-05 | Discharge: 2024-01-05 | Disposition: A | Discharge status: Home / Self Care | Source: Ambulatory Visit | Attending: Urology | Admitting: Urology

## 2024-01-05 DIAGNOSIS — C61 Malignant neoplasm of prostate: Secondary | ICD-10-CM

## 2024-01-05 DIAGNOSIS — R972 Elevated prostate specific antigen [PSA]: Secondary | ICD-10-CM

## 2024-01-05 MED ORDER — GADOPICLENOL 0.5 MMOL/ML IV SOLN
9.0000 mL | Freq: Once | INTRAVENOUS | Status: AC | PRN
Start: 2024-01-05 — End: 2024-01-05
  Administered 2024-01-05: 9 mL via INTRAVENOUS

## 2024-01-18 ENCOUNTER — Encounter (HOSPITAL_BASED_OUTPATIENT_CLINIC_OR_DEPARTMENT_OTHER): Attending: General Surgery | Admitting: General Surgery

## 2024-01-18 DIAGNOSIS — L97812 Non-pressure chronic ulcer of other part of right lower leg with fat layer exposed: Secondary | ICD-10-CM | POA: Insufficient documentation

## 2024-01-18 DIAGNOSIS — R54 Age-related physical debility: Secondary | ICD-10-CM | POA: Insufficient documentation

## 2024-01-25 ENCOUNTER — Ambulatory Visit: Admitting: Internal Medicine

## 2024-01-25 ENCOUNTER — Encounter: Payer: Self-pay | Admitting: Internal Medicine

## 2024-01-25 VITALS — BP 130/70 | HR 63 | Temp 98.0°F | Ht 73.0 in | Wt 195.6 lb

## 2024-01-25 DIAGNOSIS — N401 Enlarged prostate with lower urinary tract symptoms: Secondary | ICD-10-CM

## 2024-01-25 DIAGNOSIS — M549 Dorsalgia, unspecified: Secondary | ICD-10-CM

## 2024-01-25 DIAGNOSIS — R54 Age-related physical debility: Secondary | ICD-10-CM

## 2024-01-25 DIAGNOSIS — N1832 Chronic kidney disease, stage 3b: Secondary | ICD-10-CM

## 2024-01-25 DIAGNOSIS — C61 Malignant neoplasm of prostate: Secondary | ICD-10-CM

## 2024-01-25 DIAGNOSIS — N138 Other obstructive and reflux uropathy: Secondary | ICD-10-CM

## 2024-01-25 DIAGNOSIS — M48062 Spinal stenosis, lumbar region with neurogenic claudication: Secondary | ICD-10-CM

## 2024-01-25 DIAGNOSIS — N4289 Other specified disorders of prostate: Secondary | ICD-10-CM

## 2024-01-25 NOTE — Assessment & Plan Note (Signed)
 Diagnosed 20 years ago with indolent disease, recent MRI showed two small spots likely residual cancer. PSA levels have decreased, indicating no significant progression, and there is no evidence of metastasis to bones or spine. Discussed potential radiation therapy if spots are confirmed in the spine and considered radiation seeds to shrink the prostate and reduce cancer spread risk. Ordered MRI of the spine to assess for prostate cancer involvement and spinal stenosis. Follow up with urologist next month to discuss MRI results and potential treatment options, including radiation seeds.  Benign prostatic hyperplasia with lower urinary tract symptoms   Significant benign prostatic hyperplasia with a prostate size of 120 grams, causing incomplete bladder emptying and nocturia. Discussed potential for TURP or radiation seeds to alleviate symptoms and reduce prostate size. Considered straight catheterization to improve bladder emptying. Will discuss with urologist the potential for TURP or radiation seeds to alleviate urinary symptoms and consider straight catheterization/condom cath  to ensure complete bladder emptying before bed.

## 2024-01-25 NOTE — Patient Instructions (Signed)
 It was a pleasure seeing you today! Your health and satisfaction are our top priorities.  Bernardino Cone, MD  VISIT SUMMARY: Today, we reviewed your recent blood test results and discussed your prostate health, back pain, and overall well-being. Your PSA levels have decreased, which is a positive sign. We also talked about your enlarged prostate and its impact on your urinary function, as well as your back pain due to spinal stenosis. We discussed potential treatment options and next steps for each of these issues.  YOUR PLAN: -PROSTATE CANCER: Prostate cancer is a type of cancer that occurs in the prostate gland. Your recent MRI showed two small spots likely indicating residual cancer, but your PSA levels have decreased, suggesting no significant progression. We will follow up with your urologist next month to discuss the MRI results and potential treatment options, including radiation seeds to shrink the prostate and reduce the risk of cancer spread. An MRI of the spine has been ordered to check for any involvement of the cancer and to assess your spinal stenosis.  -BENIGN PROSTATIC HYPERPLASIA WITH LOWER URINARY TRACT SYMPTOMS: Benign prostatic hyperplasia (BPH) is an enlarged prostate gland that can cause urinary problems. Your prostate is significantly enlarged, causing incomplete bladder emptying and frequent urination at night. We discussed the potential for a procedure called TURP or radiation seeds to alleviate your symptoms and reduce the prostate size. We also considered using a straight catheter to help empty your bladder completely before bed. These options will be discussed further with your urologist.  -LUMBAR SPINAL STENOSIS WITH NEUROGENIC CLAUDICATION: Lumbar spinal stenosis is a narrowing of the spaces within your spine, which can put pressure on the nerves and cause pain. Your condition has worsened since 2019, making it difficult for you to stand for long periods. We have ordered an MRI  of your spine to assess the progression of the stenosis and will consider further evaluation based on the results.  -CHRONIC KIDNEY DISEASE, STAGE 2: Chronic kidney disease is a condition characterized by a gradual loss of kidney function. Your condition is mild and currently does not affect your treatment plans. We will continue to monitor your kidney function.  -FRAILTY SYNDROME IN GERIATRIC PATIENT: Frailty syndrome is a condition often seen in older adults, characterized by decreased strength and increased risk of falls. We advised you to avoid using ladders and to use handrails on stairs to prevent falls.  INSTRUCTIONS: Please follow up with your urologist next month to discuss the MRI results and potential treatment options for your prostate cancer and BPH. An MRI of your spine has been ordered to assess for prostate cancer involvement and spinal stenosis. Continue to monitor your kidney function and be cautious to prevent falls by avoiding ladders and using handrails on stairs.  Your Providers PCP: Cone Bernardino MATSU, MD,  [phone]) Referring Provider: Cone Bernardino MATSU, MD,  [phone]) Care Team Provider: Camillo Golas, MD,  [phone]) Care Team Provider: Nahser, Aleene PARAS, MD Care Team Provider: Eletha Boas, MD,  [phone]) Care Team Provider: Devere Lonni Righter, MD,  [phone]) Care Team Provider: Lavella Evalene Dover, MD,  [phone])  NEXT STEPS: [x]  Early Intervention: Schedule sooner appointment, call our on-call services, or go to emergency room if there is any significant Increase in pain or discomfort New or worsening symptoms Sudden or severe changes in your health [x]  Flexible Follow-Up: We recommend a No follow-ups on file. for optimal routine care. This allows for progress monitoring and treatment adjustments. [x]  Preventive Care: Schedule your  annual preventive care visit! It's typically covered by insurance and helps identify potential  health issues early. [x]  Lab & X-ray Appointments: Incomplete tests scheduled today, or call to schedule. X-rays: [facility] (M-F, 8:30am-noon or 1pm-5pm). [x]  Medical Information Release: Sign a release form at front desk to obtain relevant medical information we don't have.  MAKING THE MOST OF OUR FOCUSED 20 MINUTE APPOINTMENTS: [x]   Clearly state your top concerns at the beginning of the visit to focus our discussion [x]   If you anticipate you will need more time, please inform the front desk during scheduling - we can book multiple appointments in the same week. [x]   If you have transportation problems- use our convenient video appointments or ask about transportation support. [x]   We can get down to business faster if you use MyChart to update information before the visit and submit non-urgent questions before your visit. Thank you for taking the time to provide details through MyChart.  Let our nurse know and she can import this information into your encounter documents.  Arrival and Wait Times: [x]   Arriving on time ensures that everyone receives prompt attention. [x]   Early morning (8a) and afternoon (1p) appointments tend to have shortest wait times. [x]   Unfortunately, we cannot delay appointments for late arrivals or hold slots during phone calls.  Getting Answers and Following Up [x]   Simple Questions & Concerns: For quick questions or basic follow-up after your visit, reach us  at [phone] or MyChart messaging. [x]   Complex Concerns: If your concern is more complex, scheduling an appointment might be best. Discuss this with the staff to find the most suitable option. [x]   Lab & Imaging Results: We'll contact you directly if results are abnormal or you don't use MyChart. Most normal results will be on MyChart within 2-3 business days, with a review message from Dr. Jesus. Haven't heard back in 2 weeks? Need results sooner? Contact us  at [phone]. [x]    Referrals: Our referral coordinator will manage specialist referrals. The specialist's office should contact you within 2 weeks to schedule an appointment. Call us  if you haven't heard from them after 2 weeks.  Staying Connected [x]   MyChart: Activate your MyChart for the fastest way to access results and message us . See the last page of this paperwork for instructions on how to activate.  Bring to Your Next Appointment [x]   Medications: Please bring all your medication bottles to your next appointment to ensure we have an accurate record of your prescriptions. [x]   Health Diaries: If you're monitoring any health conditions at home, keeping a diary of your readings can be very helpful for discussions at your next appointment.  Billing [x]   X-ray & Lab Orders: These are billed by separate companies. Contact the invoicing company directly for questions or concerns. [x]   Visit Charges: Discuss any billing inquiries with our administrative services team.  Your Satisfaction Matters [x]   Share Your Experience: We strive for your satisfaction! If you have any complaints, or preferably compliments, please let Dr. Jesus know directly or contact our Practice Administrators, Manuelita Rubin or [employer], by asking at the front desk.   Reviewing Your Records [x]   Review this early draft of your clinical encounter notes below and the final encounter summary tomorrow on MyChart after its been completed.  All orders placed so far are visible here: Prostate cancer [facility]) -     MR CERVICAL SPINE W WO CONTRAST; Future -     MR THORACIC  SPINE W WO CONTRAST; Future -     MR LUMBAR SPINE W WO CONTRAST; Future  Back pain, unspecified back location, unspecified back pain laterality, unspecified chronicity -     MR CERVICAL SPINE W WO CONTRAST; Future -     MR THORACIC SPINE W WO CONTRAST; Future -     MR LUMBAR SPINE W WO CONTRAST; Future  Spinal stenosis of lumbar region with neurogenic claudication -      MR CERVICAL SPINE W WO CONTRAST; Future -     MR THORACIC SPINE W WO CONTRAST; Future -     MR LUMBAR SPINE W WO CONTRAST; Future  Benign localized prostatic hyperplasia with lower urinary tract symptoms (LUTS)  Prostate mass -     MR CERVICAL SPINE W WO CONTRAST; Future -     MR THORACIC SPINE W WO CONTRAST; Future -     MR LUMBAR SPINE W WO CONTRAST; Future  BPH with obstruction/lower urinary tract symptoms  Frailty  CKD stage 3b, GFR 30-44 ml/min (HCC)

## 2024-01-25 NOTE — Assessment & Plan Note (Signed)
 Lumbar spinal stenosis with neurogenic claudication   Worsening lumbar spinal stenosis since 2019, causing back pain and difficulty standing for prolonged periods. Previous recommendation against surgery due to age and risk of complications. Ordered MRI of the spine to assess for progression of stenosis.

## 2024-01-25 NOTE — Progress Notes (Signed)
 ==============================  [facility]: [phone]   -- Medical Office Visit --  Patient: Eugene Friedman      Age: 89 y.o.       Sex:  male  Date:   01/25/2024 Today's Healthcare Provider: Bernardino KANDICE Cone, MD  ==============================   Chief Complaint: Hypertension, Eye Problem (Yellow eyes in the white part), and review labs  Discussed the use of AI scribe software for clinical note transcription with the patient, who gave verbal consent to proceed.  History of Present Illness 89 year old male with prostate cancer who presents for review of blood test results and concerns about prostate health.  He is here to review recent blood test results, particularly focusing on his PSA levels. A previous high PSA reading led to an MRI, which showed two spots in the prostate. He was diagnosed with prostate cancer 25 years ago and has been monitoring his PSA levels regularly. His recent PSA levels have decreased. He has an upcoming follow-up with his urologist to discuss the MRI results.  He has been on finasteride  for 25 years and was recently prescribed dutasteride , although there was an issue with the pharmacy filling the prescription. He is concerned about the size of his prostate, which is significantly enlarged, and its impact on his urinary function. He experiences frequent urination at night, which disrupts his sleep, and suspects incomplete bladder emptying due to the enlarged prostate.  He has a history of spinal stenosis, which has worsened since 2019. He experiences back pain that affects his ability to stand or bend over for extended periods. He has had previous imaging of his spine and is considering further evaluation due to worsening symptoms.  He has a history of mild kidney disease. He is mindful of his fluid intake to manage his urinary symptoms and protect his kidney function.  He has experienced falls, particularly in [place] summer  home, but reports no recent falls since moving to his current location. He is active, engaging in activities such as climbing ladders and sailing, although he acknowledges the need to be cautious due to his age.  Background Reviewed: Problem List: has Hyperlipidemia; Vitamin D  deficiency; History of elevated glucose; Elevated prostate specific antigen (PSA); Labile hypertension; Prostate cancer (HCC); Spinal stenosis, lumbar; Persistent atrial fibrillation: CHA2DS2Vasc = 3 (Age & HTN); AVM (arteriovenous malformation); Stage 3b chronic kidney disease (HCC); Aortic valve sclerosis; Benign localized prostatic hyperplasia with lower urinary tract symptoms (LUTS); Healthcare maintenance; Chronic kidney disease (CKD), active medical management without dialysis, stage 2 (mild); Dry mouth; Ambulates with cane; Irritable bowel syndrome with diarrhea; Frailty syndrome in geriatric patient; Chronic pansinusitis; Hearing loss; Gas bloat syndrome; Incontinence of feces with fecal urgency; Need for vaccination; and ISTAP type 1 skin tear of left lower leg on their problem list. Past Medical History:  has a past medical history of Allergic rhinitis, Atrial fibrillation by electrocardiogram (HCC) (10/2018), Cancer (HCC), DJD (degenerative joint disease), Hyperlipidemia, Hypertension, Neuromuscular disorder (HCC), Sinus bradycardia, Spinal stenosis, and Vitamin D  deficiency. Past Surgical History:   has a past surgical history that includes cervical neck mass  (07/08/2006); Appendectomy (1956); Mastoidectomy (Left, 1936); Flexible sigmoidoscopy; Hemorrhoid surgery; Medial partial knee replacement (Left, 2013); and Back Procedure (11/02/2018). Social History:   reports that he has never smoked. He has never used smokeless tobacco. He reports current alcohol use of about 10.0 standard drinks of alcohol per week. He reports that he does not use drugs. Family History:  family history includes  Cancer in his maternal grandmother;  Diabetes in his maternal aunt, mother, and paternal aunt; Heart attack (age of onset: 68) in his father; Hypertension in his father; Stroke in his mother. Allergies:  is allergic to celebrex [celecoxib], lipitor [atorvastatin], ppd [tuberculin purified protein derivative], adhesive [tape], and nickel.   Medication Reconciliation: Current Outpatient Medications on File Prior to Visit  Medication Sig   Cholecalciferol (VITAMIN D -3) 125 MCG (5000 UT) TABS Take 1 tablet by mouth daily at 6 (six) AM.   dicyclomine  (BENTYL ) 20 MG tablet TAKE 1 TABLET BY MOUTH 3 TO 4 TIMES PER DAY BEFORE MEALS AND AT BEDTIME AS NEEDED FOR NAUSEA OR CRAMPING OR BLOATING OR DIARRHEA   diltiazem  (CARDIZEM  CD) 120 MG 24 hr capsule Take  1 capsule  Daily  with Food for BP & Heart Rhythm   doxazosin  (CARDURA ) 4 MG tablet Take 4 mg by mouth.   dutasteride  (AVODART ) 0.5 MG capsule TAKE 1 CAPSULE(0.5 MG) BY MOUTH DAILY. REPLACES FINASTERIDE    ezetimibe  (ZETIA ) 10 MG tablet Take 1 tablet (10 mg total) by mouth daily.   finasteride  (PROSCAR ) 5 MG tablet Take 5 mg by mouth.   flecainide  (TAMBOCOR ) 50 MG tablet Take 1 & 1/2  tablet (75 mg)   2 x /day  (every 12 hours )        for Afib   Multiple Vitamin (MULTIVITAMIN) tablet Take 1 tablet by mouth daily.   neomycin -polymyxin-dexamethasone  (MAXITROL) 0.1 % ophthalmic suspension Apply 1 drop to eye 4 (four) times daily.   neomycin -polymyxin-hydrocortisone (CORTISPORIN) 3.5-10000-1 ophthalmic suspension 4 (four) times daily.   polyethylene glycol (MIRALAX  / GLYCOLAX ) 17 g packet Take 17 g by mouth daily.   rifaximin  (XIFAXAN ) 550 MG TABS tablet Take 1 tablet (550 mg total) by mouth 3 (three) times daily.   Rivaroxaban  (XARELTO ) 15 MG TABS tablet Take  1 tablet  Daily  for Atrial Fibrillation & to Prevent Blood Clots   tamsulosin  (FLOMAX ) 0.4 MG CAPS capsule Take 1 capsule (0.4 mg total) by mouth daily. Must monitor blood pressure after to make sure tolerated (lowers blood pressure)    No current facility-administered medications on file prior to visit.   Medications Discontinued During This Encounter  Medication Reason   amoxicillin -clavulanate (AUGMENTIN ) 875-125 MG tablet    doxycycline  (VIBRA -TABS) 100 MG tablet      Physical Exam:    01/25/2024   10:53 AM 12/22/2023    9:33 AM 11/10/2023    8:56 AM  Vitals with BMI  Height 6' 1 6' 1 6' 1  Weight 195 lbs 10 oz 194 lbs 195 lbs 6 oz  BMI 25.81 25.6 25.79  Systolic 130 138 867  Diastolic 70 70 72  Pulse 63 66 76  Vital signs reviewed.  Nursing notes reviewed. Weight trend reviewed. Physical Activity: Sufficiently Active (01/23/2024)   Exercise Vital Sign    Days of Exercise per Week: 5 days    Minutes of Exercise per Session: 30 min   General Appearance:  No acute distress appreciable.   Well-groomed, healthy-appearing male.  Well proportioned with no abnormal fat distribution.  Good muscle tone. Pulmonary:  Normal work of breathing at rest, no respiratory distress apparent. SpO2: 98 %  Musculoskeletal: All extremities are intact.  Neurological:  Awake, alert, oriented, and engaged.  No obvious focal neurological deficits or cognitive impairments.  Sensorium seems unclouded.   Speech is clear and coherent with logical content. Psychiatric:  Appropriate mood, pleasant and cooperative demeanor, thoughtful and engaged during the  exam   Verbalized to patient: Physical Exam    Results:   Verbalized to patient: Results LABS PSA: 8.48 ng/mL Bilirubin: within normal limits Renal function: mild renal impairment  RADIOLOGY Prostate MRI: Two small lesions, possibly indolent cancer     12/22/2023    9:41 AM 12/15/2022   12:25 AM 12/09/2021   10:26 PM 11/22/2021   11:02 PM  PHQ 2/9 Scores  PHQ - 2 Score 0 0 0 0    Lab Results  Component Value Date   PSA 6.86 (H) 12/22/2023   PSA 8.48 (H) 05/31/2023   PSA 4.94 (H) 12/15/2022   PSA 6.04 (H) 12/09/2021   PSA 4.52 (H) 06/11/2021   PSA 4.09 (H)  03/06/2021   PSA 4.52 (H) 12/09/2020   PSA 3.8 04/27/2019   PSA 3.0 10/26/2018   PSA 3.2 03/17/2017    Office Visit on 12/22/2023  Component Date Value Ref Range Status   Creatinine, Urine 12/22/2023 65  20 - 320 mg/dL Final   Protein/Creat Ratio 12/22/2023 246 (H)  25 - 148 mg/g creat Final   Protein/Creatinine Ratio 12/22/2023 0.246 (H)  0.025 - 0.148 mg/mg creat Final   Total Protein, Urine 12/22/2023 16  5 - 25 mg/dL Final   Microalb, Ur 88/94/7974 7.9 (H)  0.0 - 1.9 mg/dL Final   Creatinine,U 88/94/7974 60.7  mg/dL Final   Microalb Creat Ratio 12/22/2023 130.7 (H)  0.0 - 30.0 mg/g Final   Magnesium 12/22/2023 2.1  1.5 - 2.5 mg/dL Final   Color, Urine 88/94/7974 YELLOW  Yellow;Lt. Yellow;Straw;Dark Yellow;Amber;Green;Red;Brown Final   APPearance 12/22/2023 CLEAR  Clear;Turbid;Slightly Cloudy;Cloudy Final   Specific Gravity, Urine 12/22/2023 <=1.005 (A)  1.000 - 1.030 Final   pH 12/22/2023 5.5  5.0 - 8.0 Final   Total Protein, Urine 12/22/2023 NEGATIVE  Negative Final   Urine Glucose 12/22/2023 NEGATIVE  Negative Final   Ketones, ur 12/22/2023 NEGATIVE  Negative Final   Bilirubin Urine 12/22/2023 NEGATIVE  Negative Final   Hgb urine dipstick 12/22/2023 NEGATIVE  Negative Final   Urobilinogen, UA 12/22/2023 0.2  0.0 - 1.0 Final   Leukocytes,Ua 12/22/2023 NEGATIVE  Negative Final   Nitrite 12/22/2023 NEGATIVE  Negative Final   WBC, UA 12/22/2023 0-2/hpf  0-2/hpf Final   RBC / HPF 12/22/2023 0-2/hpf  0-2/hpf Final   VITD 12/22/2023 76.54  30.00 - 100.00 ng/mL Final   PTH 12/22/2023 42  16 - 77 pg/mL Final   Calcium  12/22/2023 9.7  8.6 - 10.3 mg/dL Final   Uric Acid, Serum 12/22/2023 7.0  4.0 - 7.8 mg/dL Final   PSA 88/94/7974 6.86 (H)  0.10 - 4.00 ng/mL Final   Cholesterol 12/22/2023 207 (H)  0 - 200 mg/dL Final   Triglycerides 88/94/7974 66.0  0.0 - 149.0 mg/dL Final   HDL 88/94/7974 91.10  >39.00 mg/dL Final   VLDL 88/94/7974 13.2  0.0 - 40.0 mg/dL Final   LDL Cholesterol  12/22/2023 103 (H)  0 - 99 mg/dL Final   Total CHOL/HDL Ratio 12/22/2023 2   Final   NonHDL 12/22/2023 115.87   Final   WBC 12/22/2023 5.1  4.0 - 10.5 K/uL Final   RBC 12/22/2023 4.19 (L)  4.22 - 5.81 Mil/uL Final   Hemoglobin 12/22/2023 13.1  13.0 - 17.0 g/dL Final   HCT 88/94/7974 38.6 (L)  39.0 - 52.0 % Final   MCV 12/22/2023 92.0  78.0 - 100.0 fl Final   MCHC 12/22/2023 33.9  30.0 - 36.0 g/dL Final   RDW 88/94/7974  14.9  11.5 - 15.5 % Final   Platelets 12/22/2023 225.0  150.0 - 400.0 K/uL Final   Neutrophils Relative % 12/22/2023 75.5  43.0 - 77.0 % Final   Lymphocytes Relative 12/22/2023 14.9  12.0 - 46.0 % Final   Monocytes Relative 12/22/2023 7.9  3.0 - 12.0 % Final   Eosinophils Relative 12/22/2023 1.2  0.0 - 5.0 % Final   Basophils Relative 12/22/2023 0.5  0.0 - 3.0 % Final   Neutro Abs 12/22/2023 3.8  1.4 - 7.7 K/uL Final   Lymphs Abs 12/22/2023 0.8  0.7 - 4.0 K/uL Final   Monocytes Absolute 12/22/2023 0.4  0.1 - 1.0 K/uL Final   Eosinophils Absolute 12/22/2023 0.1  0.0 - 0.7 K/uL Final   Basophils Absolute 12/22/2023 0.0  0.0 - 0.1 K/uL Final   Sodium 12/22/2023 135  135 - 145 mEq/L Final   Potassium 12/22/2023 5.1  3.5 - 5.1 mEq/L Final   Chloride 12/22/2023 100  96 - 112 mEq/L Final   CO2 12/22/2023 27  19 - 32 mEq/L Final   Glucose, Bld 12/22/2023 84  70 - 99 mg/dL Final   BUN 88/94/7974 30 (H)  6 - 23 mg/dL Final   Creatinine, Ser 12/22/2023 1.52 (H)  0.40 - 1.50 mg/dL Final   Total Bilirubin 12/22/2023 0.6  0.2 - 1.2 mg/dL Final   Alkaline Phosphatase 12/22/2023 90  39 - 117 U/L Final   AST 12/22/2023 17  0 - 37 U/L Final   ALT 12/22/2023 15  0 - 53 U/L Final   Total Protein 12/22/2023 6.7  6.0 - 8.3 g/dL Final   Albumin 88/94/7974 4.4  3.5 - 5.2 g/dL Final   GFR 88/94/7974 39.29 (L)  >60.00 mL/min Final   Calcium  12/22/2023 9.3  8.4 - 10.5 mg/dL Final  Office Visit on 05/31/2023  Component Date Value Ref Range Status   TSH 05/31/2023 3.290  0.450 - 4.500 uIU/mL  Final   Cholesterol 05/31/2023 207 (H)  <200 mg/dL Final   HDL 95/85/7974 99  > OR = 40 mg/dL Final   Triglycerides 95/85/7974 55  <150 mg/dL Final   LDL Cholesterol (Calc) 05/31/2023 94  mg/dL (calc) Final   Total CHOL/HDL Ratio 05/31/2023 2.1  <4.9 (calc) Final   Non-HDL Cholesterol (Calc) 05/31/2023 108  <130 mg/dL (calc) Final   Sodium 95/85/7974 137  135 - 145 mEq/L Final   Potassium 05/31/2023 4.8  3.5 - 5.1 mEq/L Final   Chloride 05/31/2023 103  96 - 112 mEq/L Final   CO2 05/31/2023 26  19 - 32 mEq/L Final   Glucose, Bld 05/31/2023 90  70 - 99 mg/dL Final   BUN 95/85/7974 30 (H)  6 - 23 mg/dL Final   Creatinine, Ser 05/31/2023 1.45  0.40 - 1.50 mg/dL Final   Total Bilirubin 05/31/2023 0.5  0.2 - 1.2 mg/dL Final   Alkaline Phosphatase 05/31/2023 94  39 - 117 U/L Final   AST 05/31/2023 19  0 - 37 U/L Final   ALT 05/31/2023 21  0 - 53 U/L Final   Total Protein 05/31/2023 6.6  6.0 - 8.3 g/dL Final   Albumin 95/85/7974 4.5  3.5 - 5.2 g/dL Final   GFR 95/85/7974 41.75 (L)  >60.00 mL/min Final   Calcium  05/31/2023 9.3  8.4 - 10.5 mg/dL Final   Hgb J8r MFr Bld 05/31/2023 5.4  4.6 - 6.5 % Final   VITD 05/31/2023 79.84  30.00 - 100.00 ng/mL Final   PSA 05/31/2023 8.48 (H)  0.10 - 4.00 ng/mL Final  Office Visit on 12/15/2022  Component Date Value Ref Range Status   Color, Urine 12/15/2022 YELLOW  YELLOW Final   APPearance 12/15/2022 CLEAR  CLEAR Final   Specific Gravity, Urine 12/15/2022 1.012  1.001 - 1.035 Final   pH 12/15/2022 6.0  5.0 - 8.0 Final   Glucose, UA 12/15/2022 NEGATIVE  NEGATIVE Final   Bilirubin Urine 12/15/2022 NEGATIVE  NEGATIVE Final   Ketones, ur 12/15/2022 NEGATIVE  NEGATIVE Final   Hgb urine dipstick 12/15/2022 NEGATIVE  NEGATIVE Final   Protein, ur 12/15/2022 NEGATIVE  NEGATIVE Final   Nitrite 12/15/2022 NEGATIVE  NEGATIVE Final   Leukocytes,Ua 12/15/2022 NEGATIVE  NEGATIVE Final   Creatinine, Urine 12/15/2022 60  20 - 320 mg/dL Final   Microalb, Ur  89/70/7975 8.5  mg/dL Final   Microalb Creat Ratio 12/15/2022 142 (H)  <30 mg/g creat Final   PSA 12/15/2022 4.94 (H)  < OR = 4.00 ng/mL Final   PTH 12/15/2022 54  16 - 77 pg/mL Final   WBC 12/15/2022 5.2  3.8 - 10.8 Thousand/uL Final   RBC 12/15/2022 4.27  4.20 - 5.80 Million/uL Final   Hemoglobin 12/15/2022 13.0 (L)  13.2 - 17.1 g/dL Final   HCT 89/70/7975 39.6  38.5 - 50.0 % Final   MCV 12/15/2022 92.7  80.0 - 100.0 fL Final   MCH 12/15/2022 30.4  27.0 - 33.0 pg Final   MCHC 12/15/2022 32.8  32.0 - 36.0 g/dL Final   RDW 89/70/7975 12.8  11.0 - 15.0 % Final   Platelets 12/15/2022 237  140 - 400 Thousand/uL Final   MPV 12/15/2022 9.3  7.5 - 12.5 fL Final   Neutro Abs 12/15/2022 3,266  1,500 - 7,800 cells/uL Final   Absolute Lymphocytes 12/15/2022 1,300  850 - 3,900 cells/uL Final   Absolute Monocytes 12/15/2022 473  200 - 950 cells/uL Final   Eosinophils Absolute 12/15/2022 130  15 - 500 cells/uL Final   Basophils Absolute 12/15/2022 31  0 - 200 cells/uL Final   Neutrophils Relative % 12/15/2022 62.8  % Final   Total Lymphocyte 12/15/2022 25.0  % Final   Monocytes Relative 12/15/2022 9.1  % Final   Eosinophils Relative 12/15/2022 2.5  % Final   Basophils Relative 12/15/2022 0.6  % Final   Glucose, Bld 12/15/2022 93  65 - 99 mg/dL Final   BUN 89/70/7975 34 (H)  7 - 25 mg/dL Final   Creat 89/70/7975 1.49 (H)  0.70 - 1.22 mg/dL Final   eGFR 89/70/7975 44 (L)  > OR = 60 mL/min/1.73m2 Final   BUN/Creatinine Ratio 12/15/2022 23 (H)  6 - 22 (calc) Final   Sodium 12/15/2022 139  135 - 146 mmol/L Final   Potassium 12/15/2022 4.9  3.5 - 5.3 mmol/L Final   Chloride 12/15/2022 104  98 - 110 mmol/L Final   CO2 12/15/2022 27  20 - 32 mmol/L Final   Calcium  12/15/2022 9.5  8.6 - 10.3 mg/dL Final   Total Protein 89/70/7975 6.8  6.1 - 8.1 g/dL Final   Albumin 89/70/7975 4.3  3.6 - 5.1 g/dL Final   Globulin 89/70/7975 2.5  1.9 - 3.7 g/dL (calc) Final   AG Ratio 12/15/2022 1.7  1.0 - 2.5 (calc)  Final   Total Bilirubin 12/15/2022 0.5  0.2 - 1.2 mg/dL Final   Alkaline phosphatase (APISO) 12/15/2022 85  35 - 144 U/L Final   AST 12/15/2022 17  10 - 35 U/L Final   ALT 12/15/2022 18  9 - 46 U/L Final   Magnesium 12/15/2022 2.1  1.5 - 2.5 mg/dL Final   Cholesterol 89/70/7975 203 (H)  <200 mg/dL Final   HDL 89/70/7975 92  > OR = 40 mg/dL Final   Triglycerides 89/70/7975 68  <150 mg/dL Final   LDL Cholesterol (Calc) 12/15/2022 96  mg/dL (calc) Final   Total CHOL/HDL Ratio 12/15/2022 2.2  <4.9 (calc) Final   Non-HDL Cholesterol (Calc) 12/15/2022 111  <130 mg/dL (calc) Final   TSH 89/70/7975 2.82  0.40 - 4.50 mIU/L Final   Hgb A1c MFr Bld 12/15/2022 5.4  <5.7 % of total Hgb Final   Mean Plasma Glucose 12/15/2022 108  mg/dL Final   eAG (mmol/L) 89/70/7975 6.0  mmol/L Final   Insulin  12/15/2022 11.3  uIU/mL Final  Office Visit on 06/12/2022  Component Date Value Ref Range Status   WBC 06/12/2022 5.5  3.8 - 10.8 Thousand/uL Final   RBC 06/12/2022 4.18 (L)  4.20 - 5.80 Million/uL Final   Hemoglobin 06/12/2022 12.8 (L)  13.2 - 17.1 g/dL Final   HCT 95/73/7975 38.2 (L)  38.5 - 50.0 % Final   MCV 06/12/2022 91.4  80.0 - 100.0 fL Final   MCH 06/12/2022 30.6  27.0 - 33.0 pg Final   MCHC 06/12/2022 33.5  32.0 - 36.0 g/dL Final   RDW 95/73/7975 13.2  11.0 - 15.0 % Final   Platelets 06/12/2022 232  140 - 400 Thousand/uL Final   MPV 06/12/2022 9.3  7.5 - 12.5 fL Final   Neutro Abs 06/12/2022 3,542  1,500 - 7,800 cells/uL Final   Lymphs Abs 06/12/2022 1,183  850 - 3,900 cells/uL Final   Absolute Monocytes 06/12/2022 567  200 - 950 cells/uL Final   Eosinophils Absolute 06/12/2022 160  15 - 500 cells/uL Final   Basophils Absolute 06/12/2022 50  0 - 200 cells/uL Final   Neutrophils Relative % 06/12/2022 64.4  % Final   Total Lymphocyte 06/12/2022 21.5  % Final   Monocytes Relative 06/12/2022 10.3  % Final   Eosinophils Relative 06/12/2022 2.9  % Final   Basophils Relative 06/12/2022 0.9  % Final    Glucose, Bld 06/12/2022 94  65 - 99 mg/dL Final   BUN 95/73/7975 27 (H)  7 - 25 mg/dL Final   Creat 95/73/7975 1.29 (H)  0.70 - 1.22 mg/dL Final   eGFR 95/73/7975 52 (L)  > OR = 60 mL/min/1.71m2 Final   BUN/Creatinine Ratio 06/12/2022 21  6 - 22 (calc) Final   Sodium 06/12/2022 136  135 - 146 mmol/L Final   Potassium 06/12/2022 4.7  3.5 - 5.3 mmol/L Final   Chloride 06/12/2022 103  98 - 110 mmol/L Final   CO2 06/12/2022 27  20 - 32 mmol/L Final   Calcium  06/12/2022 9.2  8.6 - 10.3 mg/dL Final   Total Protein 95/73/7975 6.3  6.1 - 8.1 g/dL Final   Albumin 95/73/7975 4.3  3.6 - 5.1 g/dL Final   Globulin 95/73/7975 2.0  1.9 - 3.7 g/dL (calc) Final   AG Ratio 06/12/2022 2.2  1.0 - 2.5 (calc) Final   Total Bilirubin 06/12/2022 0.6  0.2 - 1.2 mg/dL Final   Alkaline phosphatase (APISO) 06/12/2022 89  35 - 144 U/L Final   AST 06/12/2022 20  10 - 35 U/L Final   ALT 06/12/2022 21  9 - 46 U/L Final   Cholesterol 06/12/2022 190  <200 mg/dL Final   HDL 95/73/7975 93  > OR = 40 mg/dL Final   Triglycerides  06/12/2022 95  <150 mg/dL Final   LDL Cholesterol (Calc) 06/12/2022 79  mg/dL (calc) Final   Total CHOL/HDL Ratio 06/12/2022 2.0  <4.9 (calc) Final   Non-HDL Cholesterol (Calc) 06/12/2022 97  <130 mg/dL (calc) Final   Hgb J8r MFr Bld 06/12/2022 5.4  <5.7 % of total Hgb Final   Mean Plasma Glucose 06/12/2022 108  mg/dL Final   eAG (mmol/L) 95/73/7975 6.0  mmol/L Final   Vit D, 25-Hydroxy 06/12/2022 77  30 - 100 ng/mL Final  Office Visit on 02/03/2022  Component Date Value Ref Range Status   WBC 02/03/2022 5.7  3.8 - 10.8 Thousand/uL Final   RBC 02/03/2022 4.25  4.20 - 5.80 Million/uL Final   Hemoglobin 02/03/2022 13.2  13.2 - 17.1 g/dL Final   HCT 87/80/7976 38.8  38.5 - 50.0 % Final   MCV 02/03/2022 91.3  80.0 - 100.0 fL Final   MCH 02/03/2022 31.1  27.0 - 33.0 pg Final   MCHC 02/03/2022 34.0  32.0 - 36.0 g/dL Final   RDW 87/80/7976 13.3  11.0 - 15.0 % Final   Platelets 02/03/2022 251   140 - 400 Thousand/uL Final   MPV 02/03/2022 9.4  7.5 - 12.5 fL Final   Neutro Abs 02/03/2022 3,620  1,500 - 7,800 cells/uL Final   Lymphs Abs 02/03/2022 1,311  850 - 3,900 cells/uL Final   Absolute Monocytes 02/03/2022 599  200 - 950 cells/uL Final   Eosinophils Absolute 02/03/2022 131  15 - 500 cells/uL Final   Basophils Absolute 02/03/2022 40  0 - 200 cells/uL Final   Neutrophils Relative % 02/03/2022 63.5  % Final   Total Lymphocyte 02/03/2022 23.0  % Final   Monocytes Relative 02/03/2022 10.5  % Final   Eosinophils Relative 02/03/2022 2.3  % Final   Basophils Relative 02/03/2022 0.7  % Final   Glucose, Bld 02/03/2022 96  65 - 99 mg/dL Final   BUN 87/80/7976 29 (H)  7 - 25 mg/dL Final   Creat 87/80/7976 1.56 (H)  0.70 - 1.22 mg/dL Final   eGFR 87/80/7976 42 (L)  > OR = 60 mL/min/1.77m2 Final   BUN/Creatinine Ratio 02/03/2022 19  6 - 22 (calc) Final   Sodium 02/03/2022 136  135 - 146 mmol/L Final   Potassium 02/03/2022 4.8  3.5 - 5.3 mmol/L Final   Chloride 02/03/2022 104  98 - 110 mmol/L Final   CO2 02/03/2022 26  20 - 32 mmol/L Final   Calcium  02/03/2022 9.1  8.6 - 10.3 mg/dL Final   Total Protein 87/80/7976 6.6  6.1 - 8.1 g/dL Final   Albumin 87/80/7976 4.0  3.6 - 5.1 g/dL Final   Globulin 87/80/7976 2.6  1.9 - 3.7 g/dL (calc) Final   AG Ratio 02/03/2022 1.5  1.0 - 2.5 (calc) Final   Total Bilirubin 02/03/2022 0.5  0.2 - 1.2 mg/dL Final   Alkaline phosphatase (APISO) 02/03/2022 89  35 - 144 U/L Final   AST 02/03/2022 18  10 - 35 U/L Final   ALT 02/03/2022 19  9 - 46 U/L Final   Magnesium 02/03/2022 2.2  1.5 - 2.5 mg/dL Final   TSH 87/80/7976 3.80  0.40 - 4.50 mIU/L Final  Admission on 01/30/2022, Discharged on 01/30/2022  Component Date Value Ref Range Status   WBC 01/30/2022 6.2  4.0 - 10.5 K/uL Final   RBC 01/30/2022 4.07 (L)  4.22 - 5.81 MIL/uL Final   Hemoglobin 01/30/2022 12.5 (L)  13.0 - 17.0 g/dL Final   HCT  01/30/2022 37.6 (L)  39.0 - 52.0 % Final   MCV 01/30/2022  92.4  80.0 - 100.0 fL Final   MCH 01/30/2022 30.7  26.0 - 34.0 pg Final   MCHC 01/30/2022 33.2  30.0 - 36.0 g/dL Final   RDW 87/84/7976 14.3  11.5 - 15.5 % Final   Platelets 01/30/2022 219  150 - 400 K/uL Final   nRBC 01/30/2022 0.0  0.0 - 0.2 % Final   Neutrophils Relative % 01/30/2022 74  % Final   Neutro Abs 01/30/2022 4.6  1.7 - 7.7 K/uL Final   Lymphocytes Relative 01/30/2022 14  % Final   Lymphs Abs 01/30/2022 0.9  0.7 - 4.0 K/uL Final   Monocytes Relative 01/30/2022 10  % Final   Monocytes Absolute 01/30/2022 0.6  0.1 - 1.0 K/uL Final   Eosinophils Relative 01/30/2022 1  % Final   Eosinophils Absolute 01/30/2022 0.1  0.0 - 0.5 K/uL Final   Basophils Relative 01/30/2022 1  % Final   Basophils Absolute 01/30/2022 0.0  0.0 - 0.1 K/uL Final   Immature Granulocytes 01/30/2022 0  % Final   Abs Immature Granulocytes 01/30/2022 0.02  0.00 - 0.07 K/uL Final   Sodium 01/30/2022 133 (L)  135 - 145 mmol/L Final   Potassium 01/30/2022 4.9  3.5 - 5.1 mmol/L Final   Chloride 01/30/2022 100  98 - 111 mmol/L Final   CO2 01/30/2022 24  22 - 32 mmol/L Final   Glucose, Bld 01/30/2022 103 (H)  70 - 99 mg/dL Final   BUN 87/84/7976 28 (H)  8 - 23 mg/dL Final   Creatinine, Ser 01/30/2022 1.70 (H)  0.61 - 1.24 mg/dL Final   Calcium  01/30/2022 8.9  8.9 - 10.3 mg/dL Final   Total Protein 87/84/7976 6.7  6.5 - 8.1 g/dL Final   Albumin 87/84/7976 4.0  3.5 - 5.0 g/dL Final   AST 87/84/7976 21  15 - 41 U/L Final   ALT 01/30/2022 19  0 - 44 U/L Final   Alkaline Phosphatase 01/30/2022 81  38 - 126 U/L Final   Total Bilirubin 01/30/2022 0.6  0.3 - 1.2 mg/dL Final   GFR, Estimated 01/30/2022 38 (L)  >60 mL/min Final   Anion gap 01/30/2022 9  5 - 15 Final   Glucose-Capillary 01/30/2022 104 (H)  70 - 99 mg/dL Final   Color, Urine 87/84/7976 YELLOW  YELLOW Final   APPearance 01/30/2022 CLEAR  CLEAR Final   Specific Gravity, Urine 01/30/2022 1.005  1.005 - 1.030 Final   pH 01/30/2022 6.0  5.0 - 8.0 Final    Glucose, UA 01/30/2022 NEGATIVE  NEGATIVE mg/dL Final   Hgb urine dipstick 01/30/2022 NEGATIVE  NEGATIVE Final   Bilirubin Urine 01/30/2022 NEGATIVE  NEGATIVE Final   Ketones, ur 01/30/2022 NEGATIVE  NEGATIVE mg/dL Final   Protein, ur 87/84/7976 NEGATIVE  NEGATIVE mg/dL Final   Nitrite 87/84/7976 NEGATIVE  NEGATIVE Final   Leukocytes,Ua 01/30/2022 NEGATIVE  NEGATIVE Final  No image results found. MR PROSTATE W WO CONTRAST Result Date: 01/05/2024 EXAM: MRI PROSTATE WITHOUT AND WITH INTRAVENOUS CONTRAST 01/05/2024 09:33:53 AM TECHNIQUE: Multiparametric MRI imaging of the prostate without and with dynamic contrast enhanced imaging and diffusion weighted imaging was performed. 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 Dynacad/CAD was utilized in analysis of images. COMPARISON: None available. CLINICAL HISTORY: Elevated  PSA level. History of prostate cancer. FINDINGS: PROSTATE: Encapsulated nodularity in the transition zone compatible with benign prostatic hypertrophy. Prostate volume by 3D volumetric analysis: 117.7 cubic centimeters (6.7 x 5.1 x 6.5 cm). Region of interest # 1 : PI-RADS category 5 lesion of the left posteromedial peripheral zone in the mid gland and apex with focally reduced T2 signal ( image 78 series 9 ) corresponding to reduced ADC map activity and restricted diffusion (image 23 series 6 and 7) and focal early enhancement ( image 205 series 12). This measures 0.79 cubic centimeters (1.6 x 0.5 x 1.1 cm). Region of interest # 2 : PI-RADS category 4 lesion of the right anterior transition zone at the apex with low T2 signal (image 80 series 9) corresponding to reduced ADC map activity and restricted diffusion ( image 23 series 6 and 7). This measures 0.52 cubic centimeters ( 1.2 x 0.6 x 0.8 cm). SEMINAL  VESICLES: Accentuated precontrast T1 signal in the right seminal vesicle could represent peritoneal fluid or blood products. NEUROVASCULAR BUNDLE: Unremarkable. LYMPHADENOPATHY: No lymphadenopathy. BLADDER: The bladder is unremarkable. BOWEL: Sigmoid colon diverticulosis. PERITONEAL CAVITY: No free fluid. BONES: Normal bone marrow signal intensity. No suspicious or aggressive osseous lesions. Severe right and moderate left hip arthropathy. Lower lumbar spondylosis and degenerative disc disease. SOFT TISSUES: No focal abnormality. IMPRESSION: 1. ROI #1: PI-RADS 5 lesion in the left posteromedial peripheral zone, mid gland and apex, measuring 0.79 cc. 2. ROI #2: PI-RADS 4 lesion in the right anterior transition zone at the apex, measuring 0.52 cc. 3. Benign prostatic hypertrophy with estimated prostate volume of 117.7 mL. 4. Accentuated precontrast T1 signal in the right seminal vesicle, possibly representing blood products or fluid. 5. Severe right and moderate left hip arthropathy. 6. Lower lumbar spondylosis and degenerative disc disease. Electronically signed by: Adonai Selsor Salvage MD 01/05/2024 09:54 AM EST RP Workstation: [id number]       ASSESSMENT & PLAN   Assessment & Plan Prostate cancer [facility]) BPH with obstruction/lower urinary tract symptoms Prostate mass Benign localized prostatic hyperplasia with lower urinary tract symptoms (LUTS) Diagnosed 20 years ago with indolent disease, recent MRI showed two small spots likely residual cancer. PSA levels have decreased, indicating no significant progression, and there is no evidence of metastasis to bones or spine. Discussed potential radiation therapy if spots are confirmed in the spine and considered radiation seeds to shrink the prostate and reduce cancer spread risk. Ordered MRI of the spine to assess for prostate cancer involvement and spinal stenosis. Follow up with urologist next month to discuss MRI results and potential treatment options,  including radiation seeds.  Benign prostatic hyperplasia with lower urinary tract symptoms   Significant benign prostatic hyperplasia with a prostate size of 120 grams, causing incomplete bladder emptying and nocturia. Discussed potential for TURP or radiation seeds to alleviate symptoms and reduce prostate size. Considered straight catheterization to improve bladder emptying. Will discuss with urologist the potential for TURP or radiation seeds to alleviate urinary symptoms and consider straight catheterization/condom cath  to ensure complete bladder emptying before bed. Back pain, unspecified back location, unspecified back pain laterality, unspecified chronicity Spinal stenosis of lumbar region with neurogenic claudication Lumbar spinal stenosis with neurogenic claudication   Worsening lumbar spinal stenosis since 2019, causing back pain and difficulty standing for prolonged periods. Previous recommendation against surgery due to age and risk of complications. Ordered MRI of the spine to assess for progression of stenosis. Frailty Frailty syndrome with increased risk of falls and balance issues. Advised  against using ladders and recommended using handrails on stairs to prevent falls CKD stage 3b, GFR 30-44 ml/min (HCC) Mild chronic kidney disease, improved to stage 2-3a most recently, with no immediate concerns regarding kidney function affecting current treatment plans.  ORDER ASSOCIATIONS  #   DIAGNOSIS / CONDITION ICD-10 ENCOUNTER ORDER     ICD-10-CM   1. Prostate cancer (HCC)  C61 MR CERVICAL SPINE W WO CONTRAST    MR THORACIC SPINE W WO CONTRAST    MR LUMBAR SPINE W WO CONTRAST    2. Back pain, unspecified back location, unspecified back pain laterality, unspecified chronicity  M54.9 MR CERVICAL SPINE W WO CONTRAST    MR THORACIC SPINE W WO CONTRAST    MR LUMBAR SPINE W WO CONTRAST    3. Spinal stenosis of lumbar region with neurogenic claudication  M48.062 MR CERVICAL SPINE W WO  CONTRAST    MR THORACIC SPINE W WO CONTRAST    MR LUMBAR SPINE W WO CONTRAST    4. Benign localized prostatic hyperplasia with lower urinary tract symptoms (LUTS)  N40.1     5. Prostate mass  N42.89 MR CERVICAL SPINE W WO CONTRAST    MR THORACIC SPINE W WO CONTRAST    MR LUMBAR SPINE W WO CONTRAST    6. BPH with obstruction/lower urinary tract symptoms  N40.1    N13.8     7. Frailty  R54     8. CKD stage 3b, GFR 30-44 ml/min (HCC)  N18.32       Imaging Orders         MR CERVICAL SPINE W WO CONTRAST         MR THORACIC SPINE W WO CONTRAST         MR LUMBAR SPINE W WO CONTRAST          This document was synthesized by artificial intelligence (Abridge) using HIPAA-compliant recording of the clinical interaction;   We discussed the use of AI scribe software for clinical note transcription with the patient, who gave verbal consent to proceed. additional Info: This encounter employed state-of-the-art, real-time, collaborative documentation. The patient actively reviewed and assisted in updating their electronic medical record on a shared screen, ensuring transparency and facilitating joint problem-solving for the problem list, overview, and plan. This approach promotes accurate, informed care. The treatment plan was discussed and reviewed in detail, including medication safety, potential side effects, and all patient questions. We confirmed understanding and comfort with the plan. Follow-up instructions were established, including contacting the office for any concerns, returning if symptoms worsen, persist, or new symptoms develop, and precautions for potential emergency department visits.

## 2024-01-31 ENCOUNTER — Other Ambulatory Visit (HOSPITAL_BASED_OUTPATIENT_CLINIC_OR_DEPARTMENT_OTHER): Payer: Self-pay

## 2024-01-31 MED ORDER — SHINGRIX 50 MCG/0.5ML IM SUSR
0.5000 mL | Freq: Once | INTRAMUSCULAR | 0 refills | Status: AC
  Filled 2024-01-31: qty 0.5, 1d supply, fill #0

## 2024-02-01 ENCOUNTER — Encounter (HOSPITAL_BASED_OUTPATIENT_CLINIC_OR_DEPARTMENT_OTHER): Admitting: General Surgery

## 2024-02-19 ENCOUNTER — Encounter: Payer: Self-pay | Admitting: Internal Medicine

## 2024-03-04 ENCOUNTER — Ambulatory Visit
Admission: RE | Admit: 2024-03-04 | Discharge: 2024-03-04 | Disposition: A | Source: Ambulatory Visit | Attending: Internal Medicine

## 2024-03-04 DIAGNOSIS — C61 Malignant neoplasm of prostate: Secondary | ICD-10-CM

## 2024-03-04 DIAGNOSIS — M549 Dorsalgia, unspecified: Secondary | ICD-10-CM

## 2024-03-04 DIAGNOSIS — N4289 Other specified disorders of prostate: Secondary | ICD-10-CM

## 2024-03-04 DIAGNOSIS — M48062 Spinal stenosis, lumbar region with neurogenic claudication: Secondary | ICD-10-CM

## 2024-03-04 MED ORDER — GADOPICLENOL 0.5 MMOL/ML IV SOLN
9.0000 mL | Freq: Once | INTRAVENOUS | Status: AC | PRN
  Administered 2024-03-04: 9 mL via INTRAVENOUS

## 2024-03-07 ENCOUNTER — Ambulatory Visit
Admission: RE | Admit: 2024-03-07 | Discharge: 2024-03-07 | Disposition: A | Source: Ambulatory Visit | Attending: Internal Medicine

## 2024-03-07 DIAGNOSIS — M48062 Spinal stenosis, lumbar region with neurogenic claudication: Secondary | ICD-10-CM

## 2024-03-07 DIAGNOSIS — C61 Malignant neoplasm of prostate: Secondary | ICD-10-CM

## 2024-03-07 DIAGNOSIS — M549 Dorsalgia, unspecified: Secondary | ICD-10-CM

## 2024-03-07 DIAGNOSIS — N4289 Other specified disorders of prostate: Secondary | ICD-10-CM

## 2024-03-07 MED ORDER — GADOPICLENOL 0.5 MMOL/ML IV SOLN
9.0000 mL | Freq: Once | INTRAVENOUS | Status: AC | PRN
  Administered 2024-03-07: 9 mL via INTRAVENOUS

## 2024-03-09 ENCOUNTER — Telehealth: Payer: Self-pay | Admitting: Radiology

## 2024-03-09 NOTE — Telephone Encounter (Signed)
STAT read requested

## 2024-03-11 ENCOUNTER — Ambulatory Visit: Payer: Self-pay | Admitting: Internal Medicine

## 2024-03-11 DIAGNOSIS — M48062 Spinal stenosis, lumbar region with neurogenic claudication: Secondary | ICD-10-CM

## 2024-03-12 MED ORDER — GABAPENTIN 100 MG PO CAPS: 100.0000 mg | ORAL_CAPSULE | Freq: Three times a day (TID) | ORAL | 3 refills | Status: AC

## 2024-03-12 NOTE — Progress Notes (Signed)
 Urgent physical therapy referral placed put in teamschat

## 2024-03-13 NOTE — Progress Notes (Signed)
 Placed in stat chat as of today

## 2024-03-13 NOTE — Telephone Encounter (Signed)
 Tried to call pt daughter no answer went to voicemail advise her to get on mychart per provider to review notes and results from provider per providers request.

## 2024-03-21 ENCOUNTER — Ambulatory Visit: Admitting: Internal Medicine

## 2024-03-22 ENCOUNTER — Encounter: Payer: Self-pay | Admitting: Internal Medicine

## 2024-03-22 ENCOUNTER — Ambulatory Visit: Admitting: Internal Medicine

## 2024-03-22 VITALS — BP 140/74 | HR 84 | Temp 98.0°F | Ht 73.0 in | Wt 192.2 lb

## 2024-03-22 DIAGNOSIS — M47812 Spondylosis without myelopathy or radiculopathy, cervical region: Secondary | ICD-10-CM

## 2024-03-22 DIAGNOSIS — C61 Malignant neoplasm of prostate: Secondary | ICD-10-CM

## 2024-03-22 DIAGNOSIS — R4189 Other symptoms and signs involving cognitive functions and awareness: Secondary | ICD-10-CM

## 2024-03-22 NOTE — Patient Instructions (Addendum)
 Consider completing a POLST/MOLST form given his limited life expectancy  It was a pleasure seeing you today! Your health and satisfaction are our top priorities.  Bernardino Cone, MD  VISIT SUMMARY: During your visit, we discussed your prostate cancer, which has metastasized to your spine, and your cervical spine issues. We also addressed your mild cognitive changes and planned further evaluations.  YOUR PLAN: -PROSTATE CANCER METASTATIC TO BONE: Your prostate cancer has spread to the bones in your spine at T7 and right T10. This means that cancer cells have moved from your prostate to these areas. Although you currently do not have pain in these areas, there is a risk of future pain if the metastasis progresses. We recommend radiation therapy to prevent the growth of these metastases and potential pain. Androgen deprivation therapy (ADT) was discussed as an alternative, but you prefer radiation therapy due to its targeted approach. You will be referred to radiation oncology for evaluation and potential treatment. An MRI of the brain is also ordered to rule out other causes of your symptoms. Please discuss treatment options and goals of care with the radiation oncologist. Additionally, consider updating your advanced directives to reflect your current health status and preferences.  -CERVICAL SPONDYLOSIS WITH RADICULOPATHY: You have severe arthritis in your cervical spine, which is causing mild pressure on the nerves and resulting in shoulder pain. This condition is known as cervical spondylosis with radiculopathy. Surgical intervention is not required at this time. We recommend continuing physical therapy and pain management. You will be referred to physical therapy for management of your cervical spondylosis and to a pain specialist for your shoulder pain.  -COGNITIVE CHANGES: You have reported mild cognitive changes, such as occasional forgetfulness of recent events. This is not severe, but we want to  investigate further. Given your family history of a brain tumor, we have ordered an MRI of the brain to evaluate potential causes of these cognitive changes.  INSTRUCTIONS: Please follow up with the radiation oncologist for evaluation and potential treatment of your spinal metastasis. Additionally, follow up with physical therapy for your cervical spondylosis and with a pain specialist for your shoulder pain. An MRI of the brain has been ordered to evaluate potential causes of your cognitive changes. Consider updating your advanced directives to reflect your current health status and preferences.  Your Providers PCP: Cone Bernardino MATSU, MD,  [phone]) Referring Provider: Cone Bernardino MATSU, MD,  [phone]) Care Team Provider: Camillo Golas, MD,  [phone]) Care Team Provider: Nahser, Aleene PARAS, MD Care Team Provider: Eletha Boas, MD,  [phone]) Care Team Provider: Devere Lonni Righter, MD,  [phone]) Care Team Provider: Lavella Evalene Dover, MD,  [phone])  NEXT STEPS: [x]  Early Intervention: Schedule sooner appointment, call our on-call services, or go to emergency room if there is any significant Increase in pain or discomfort New or worsening symptoms Sudden or severe changes in your health [x]  Flexible Follow-Up: We recommend a No follow-ups on file. for optimal routine care. This allows for progress monitoring and treatment adjustments. [x]  Preventive Care: Schedule your annual preventive care visit! It's typically covered by insurance and helps identify potential health issues early. [x]  Lab & X-ray Appointments: Incomplete tests scheduled today, or call to schedule. X-rays: [facility] (M-F, 8:30am-noon or 1pm-5pm). [x]  Medical Information Release: Sign a release form at front desk to obtain relevant medical information we don't have.  MAKING THE MOST OF OUR FOCUSED 20 MINUTE APPOINTMENTS: [x]   Clearly state your top concerns at the  beginning of the visit to focus our discussion [x]   If you anticipate you will need more time, please inform the front desk during scheduling - we can book multiple appointments in the same week. [x]   If you have transportation problems- use our convenient video appointments or ask about transportation support. [x]   We can get down to business faster if you use MyChart to update information before the visit and submit non-urgent questions before your visit. Thank you for taking the time to provide details through MyChart.  Let our nurse know and she can import this information into your encounter documents.  Arrival and Wait Times: [x]   Arriving on time ensures that everyone receives prompt attention. [x]   Early morning (8a) and afternoon (1p) appointments tend to have shortest wait times. [x]   Unfortunately, we cannot delay appointments for late arrivals or hold slots during phone calls.  Getting Answers and Following Up [x]   Simple Questions & Concerns: For quick questions or basic follow-up after your visit, reach us  at [phone] or MyChart messaging. [x]   Complex Concerns: If your concern is more complex, scheduling an appointment might be best. Discuss this with the staff to find the most suitable option. [x]   Lab & Imaging Results: We'll contact you directly if results are abnormal or you don't use MyChart. Most normal results will be on MyChart within 2-3 business days, with a review message from Dr. Jesus. Haven't heard back in 2 weeks? Need results sooner? Contact us  at [phone]. [x]   Referrals: Our referral coordinator will manage specialist referrals. The specialist's office should contact you within 2 weeks to schedule an appointment. Call us  if you haven't heard from them after 2 weeks.  Staying Connected [x]   MyChart: Activate your MyChart for the fastest way to access results and message us . See the last page of this paperwork for instructions on how to  activate.  Bring to Your Next Appointment [x]   Medications: Please bring all your medication bottles to your next appointment to ensure we have an accurate record of your prescriptions. [x]   Health Diaries: If you're monitoring any health conditions at home, keeping a diary of your readings can be very helpful for discussions at your next appointment.  Billing [x]   X-ray & Lab Orders: These are billed by separate companies. Contact the invoicing company directly for questions or concerns. [x]   Visit Charges: Discuss any billing inquiries with our administrative services team.  Your Satisfaction Matters [x]   Share Your Experience: We strive for your satisfaction! If you have any complaints, or preferably compliments, please let Dr. Jesus know directly or contact our Practice Administrators, Manuelita Rubin or [employer], by asking at the front desk.   Reviewing Your Records [x]   Review this early draft of your clinical encounter notes below and the final encounter summary tomorrow on MyChart after its been completed.  All orders placed so far are visible here: Prostate cancer metastatic to bone [facility]) -     Ambulatory referral to Pain Clinic -     Ambulatory referral to Physical Therapy -     MR BRAIN W WO CONTRAST; Future -     Ambulatory referral to Radiation Oncology  Brain fog -     Ambulatory referral to Pain Clinic -     Ambulatory referral to Physical Therapy -     MR BRAIN W WO CONTRAST; Future -     Ambulatory referral to Radiation Oncology

## 2024-03-22 NOTE — Progress Notes (Signed)
 ==============================  [facility]: [phone]   -- Medical Office Visit --  Patient: Eugene Friedman      Age: 89 y.o.       Sex:  male  Date:   03/22/2024 Today's Healthcare Provider: Bernardino KANDICE Cone, MD  ==============================   Chief Complaint: Discuss MRI results  Discussed the use of AI scribe software for clinical note transcription with the patient, who gave verbal consent to proceed.  History of Present Illness 89 year old male with prostate cancer who presents for evaluation of potential metastasis to the spine.  He has a history of prostate cancer diagnosed 20 years ago, initially considered slow-growing and not requiring immediate treatment. A recent MRI indicated a large prostate with suspected cancerous areas, and a biopsy confirmed cancer presence. He is scheduled to see a urologist in April for further evaluation.  An MRI of the spine revealed small bone metastases at T7 and right T10 posterior elements. He experiences discomfort when leaning forward for more than two minutes but has no significant back pain.  He has a history of neck pain. He experiences shoulder pain, possibly related to cervical spine issues, and is currently seeing a physical therapist.  He denies frequent headaches but reports occasional memory lapses over the past year or two, which he attributes to aging. He has a family history of a brother who passed away from a brain tumor at an early age.  He is currently taking Neurontin  (gabapentin ) for pain management, which he believes may contribute to increased sleepiness, especially in the evenings.  Lab Results  Component Value Date   PSA 6.86 (H) 12/22/2023   PSA 8.48 (H) 05/31/2023   PSA 4.94 (H) 12/15/2022   PSA 6.04 (H) 12/09/2021   PSA 4.52 (H) 06/11/2021   PSA 4.09 (H) 03/06/2021   PSA 4.52 (H) 12/09/2020   PSA 3.8 04/27/2019   PSA 3.0 10/26/2018   PSA 3.2 03/17/2017   Background  Reviewed: Problem List: has Hyperlipidemia; Vitamin D  deficiency; History of elevated glucose; Elevated prostate specific antigen (PSA); Labile hypertension; Prostate cancer (HCC); Spinal stenosis, lumbar; Persistent atrial fibrillation: CHA2DS2Vasc = 3 (Age & HTN); AVM (arteriovenous malformation); Stage 3b chronic kidney disease (HCC); Aortic valve sclerosis; Benign localized prostatic hyperplasia with lower urinary tract symptoms (LUTS); Healthcare maintenance; Chronic kidney disease (CKD), active medical management without dialysis, stage 2 (mild); Dry mouth; Ambulates with cane; Irritable bowel syndrome with diarrhea; Frailty syndrome in geriatric patient; Chronic pansinusitis; Hearing loss; Gas bloat syndrome; Incontinence of feces with fecal urgency; Need for vaccination; and ISTAP type 1 skin tear of left lower leg on their problem list. Past Medical History:  has a past medical history of Allergic rhinitis, Atrial fibrillation by electrocardiogram (HCC) (10/2018), Cancer (HCC), DJD (degenerative joint disease), Hyperlipidemia, Hypertension, Neuromuscular disorder (HCC), Sinus bradycardia, Spinal stenosis, and Vitamin D  deficiency. Past Surgical History:   has a past surgical history that includes cervical neck mass  (07/08/2006); Appendectomy (1956); Mastoidectomy (Left, 1936); Flexible sigmoidoscopy; Hemorrhoid surgery; Medial partial knee replacement (Left, 2013); and Back Procedure (11/02/2018). Social History:   reports that he has never smoked. He has never used smokeless tobacco. He reports current alcohol use of about 10.0 standard drinks of alcohol per week. He reports that he does not use drugs. Family History:  family history includes Cancer in his maternal grandmother; Diabetes in his maternal aunt, mother, and paternal aunt; Heart attack (age of onset: 45) in his father; Hypertension in his father;  Stroke in his mother. Allergies:  is allergic to celebrex [celecoxib], lipitor  [atorvastatin], ppd [tuberculin purified protein derivative], adhesive [tape], and nickel.   Medication Reconciliation: Current Outpatient Medications on File Prior to Visit  Medication Sig   Cholecalciferol (VITAMIN D -3) 125 MCG (5000 UT) TABS Take 1 tablet by mouth daily at 6 (six) AM.   dicyclomine  (BENTYL ) 20 MG tablet TAKE 1 TABLET BY MOUTH 3 TO 4 TIMES PER DAY BEFORE MEALS AND AT BEDTIME AS NEEDED FOR NAUSEA OR CRAMPING OR BLOATING OR DIARRHEA   diltiazem  (CARDIZEM  CD) 120 MG 24 hr capsule Take  1 capsule  Daily  with Food for BP & Heart Rhythm   doxazosin  (CARDURA ) 4 MG tablet Take 4 mg by mouth.   dutasteride  (AVODART ) 0.5 MG capsule TAKE 1 CAPSULE(0.5 MG) BY MOUTH DAILY. REPLACES FINASTERIDE    ezetimibe  (ZETIA ) 10 MG tablet Take 1 tablet (10 mg total) by mouth daily.   finasteride  (PROSCAR ) 5 MG tablet Take 5 mg by mouth.   flecainide  (TAMBOCOR ) 50 MG tablet Take 1 & 1/2  tablet (75 mg)   2 x /day  (every 12 hours )        for Afib   gabapentin  (NEURONTIN ) 100 MG capsule Take 1 capsule (100 mg total) by mouth 3 (three) times daily.   Multiple Vitamin (MULTIVITAMIN) tablet Take 1 tablet by mouth daily.   neomycin -polymyxin-dexamethasone  (MAXITROL) 0.1 % ophthalmic suspension Apply 1 drop to eye 4 (four) times daily.   neomycin -polymyxin-hydrocortisone (CORTISPORIN) 3.5-10000-1 ophthalmic suspension 4 (four) times daily.   polyethylene glycol (MIRALAX  / GLYCOLAX ) 17 g packet Take 17 g by mouth daily.   rifaximin  (XIFAXAN ) 550 MG TABS tablet Take 1 tablet (550 mg total) by mouth 3 (three) times daily.   Rivaroxaban  (XARELTO ) 15 MG TABS tablet Take  1 tablet  Daily  for Atrial Fibrillation & to Prevent Blood Clots   tamsulosin  (FLOMAX ) 0.4 MG CAPS capsule Take 1 capsule (0.4 mg total) by mouth daily. Must monitor blood pressure after to make sure tolerated (lowers blood pressure)   No current facility-administered medications on file prior to visit.  There are no discontinued  medications.   Physical Exam:    03/22/2024    9:21 AM 01/25/2024   10:53 AM 12/22/2023    9:33 AM  Vitals with BMI  Height 6' 1 6' 1 6' 1  Weight 192 lbs 3 oz 195 lbs 10 oz 194 lbs  BMI 25.36 25.81 25.6  Systolic 140 130 861  Diastolic 74 70 70  Pulse 84 63 66  Vital signs reviewed.  Nursing notes reviewed. Weight trend reviewed. Physical Activity: Sufficiently Active (01/23/2024)   Exercise Vital Sign    Days of Exercise per Week: 5 days    Minutes of Exercise per Session: 30 min   General Appearance:  No acute distress appreciable.   Well-groomed, healthy-appearing male.  Well proportioned with no abnormal fat distribution.  Good muscle tone. Pulmonary:  Normal work of breathing at rest, no respiratory distress apparent. SpO2: 98 %  Musculoskeletal: All extremities are intact.  Neurological:  Awake, alert, oriented, and engaged.  No obvious focal neurological deficits or cognitive impairments.  Sensorium seems unclouded.   Speech is clear and coherent with logical content. Psychiatric:  Appropriate mood, pleasant and cooperative demeanor, thoughtful and engaged during the exam  Verbalized to patient: Results Labs Vitamin D : Within normal limits  Radiology Cervical spine MRI: Severe cervical spondylosis; mild spinal cord compression; no surgical lesion; no evidence of  metastatic disease. Thoracic spine MRI: Small osseous metastases at T7 and right T10 posterior elements; no compression fracture. (Independently interpreted) Lumbar spine MRI: Degenerative changes; no evidence of metastatic disease. Prostate MRI: Large lesion suspicious for malignancy; consistent with known prostate carcinoma.     12/22/2023    9:41 AM 12/15/2022   12:25 AM 12/09/2021   10:26 PM 11/22/2021   11:02 PM  PHQ 2/9 Scores  PHQ - 2 Score 0 0 0 0   Office Visit on 12/22/2023  Component Date Value Ref Range Status   Creatinine, Urine 12/22/2023 65  20 - 320 mg/dL Final   Protein/Creat Ratio  12/22/2023 246 (H)  25 - 148 mg/g creat Final   Protein/Creatinine Ratio 12/22/2023 0.246 (H)  0.025 - 0.148 mg/mg creat Final   Total Protein, Urine 12/22/2023 16  5 - 25 mg/dL Final   Microalb, Ur 88/94/7974 7.9 (H)  0.0 - 1.9 mg/dL Final   Creatinine,U 88/94/7974 60.7  mg/dL Final   Microalb Creat Ratio 12/22/2023 130.7 (H)  0.0 - 30.0 mg/g Final   Magnesium 12/22/2023 2.1  1.5 - 2.5 mg/dL Final   Color, Urine 88/94/7974 YELLOW  Yellow;Lt. Yellow;Straw;Dark Yellow;Amber;Green;Red;Brown Final   APPearance 12/22/2023 CLEAR  Clear;Turbid;Slightly Cloudy;Cloudy Final   Specific Gravity, Urine 12/22/2023 <=1.005 (A)  1.000 - 1.030 Final   pH 12/22/2023 5.5  5.0 - 8.0 Final   Total Protein, Urine 12/22/2023 NEGATIVE  Negative Final   Urine Glucose 12/22/2023 NEGATIVE  Negative Final   Ketones, ur 12/22/2023 NEGATIVE  Negative Final   Bilirubin Urine 12/22/2023 NEGATIVE  Negative Final   Hgb urine dipstick 12/22/2023 NEGATIVE  Negative Final   Urobilinogen, UA 12/22/2023 0.2  0.0 - 1.0 Final   Leukocytes,Ua 12/22/2023 NEGATIVE  Negative Final   Nitrite 12/22/2023 NEGATIVE  Negative Final   WBC, UA 12/22/2023 0-2/hpf  0-2/hpf Final   RBC / HPF 12/22/2023 0-2/hpf  0-2/hpf Final   VITD 12/22/2023 76.54  30.00 - 100.00 ng/mL Final   PTH 12/22/2023 42  16 - 77 pg/mL Final   Calcium  12/22/2023 9.7  8.6 - 10.3 mg/dL Final   Uric Acid, Serum 12/22/2023 7.0  4.0 - 7.8 mg/dL Final   PSA 88/94/7974 6.86 (H)  0.10 - 4.00 ng/mL Final   Cholesterol 12/22/2023 207 (H)  0 - 200 mg/dL Final   Triglycerides 88/94/7974 66.0  0.0 - 149.0 mg/dL Final   HDL 88/94/7974 91.10  >39.00 mg/dL Final   VLDL 88/94/7974 13.2  0.0 - 40.0 mg/dL Final   LDL Cholesterol 12/22/2023 103 (H)  0 - 99 mg/dL Final   Total CHOL/HDL Ratio 12/22/2023 2   Final   NonHDL 12/22/2023 115.87   Final   WBC 12/22/2023 5.1  4.0 - 10.5 K/uL Final   RBC 12/22/2023 4.19 (L)  4.22 - 5.81 Mil/uL Final   Hemoglobin 12/22/2023 13.1  13.0 -  17.0 g/dL Final   HCT 88/94/7974 38.6 (L)  39.0 - 52.0 % Final   MCV 12/22/2023 92.0  78.0 - 100.0 fl Final   MCHC 12/22/2023 33.9  30.0 - 36.0 g/dL Final   RDW 88/94/7974 14.9  11.5 - 15.5 % Final   Platelets 12/22/2023 225.0  150.0 - 400.0 K/uL Final   Neutrophils Relative % 12/22/2023 75.5  43.0 - 77.0 % Final   Lymphocytes Relative 12/22/2023 14.9  12.0 - 46.0 % Final   Monocytes Relative 12/22/2023 7.9  3.0 - 12.0 % Final   Eosinophils Relative 12/22/2023 1.2  0.0 - 5.0 %  Final   Basophils Relative 12/22/2023 0.5  0.0 - 3.0 % Final   Neutro Abs 12/22/2023 3.8  1.4 - 7.7 K/uL Final   Lymphs Abs 12/22/2023 0.8  0.7 - 4.0 K/uL Final   Monocytes Absolute 12/22/2023 0.4  0.1 - 1.0 K/uL Final   Eosinophils Absolute 12/22/2023 0.1  0.0 - 0.7 K/uL Final   Basophils Absolute 12/22/2023 0.0  0.0 - 0.1 K/uL Final   Sodium 12/22/2023 135  135 - 145 mEq/L Final   Potassium 12/22/2023 5.1  3.5 - 5.1 mEq/L Final   Chloride 12/22/2023 100  96 - 112 mEq/L Final   CO2 12/22/2023 27  19 - 32 mEq/L Final   Glucose, Bld 12/22/2023 84  70 - 99 mg/dL Final   BUN 88/94/7974 30 (H)  6 - 23 mg/dL Final   Creatinine, Ser 12/22/2023 1.52 (H)  0.40 - 1.50 mg/dL Final   Total Bilirubin 12/22/2023 0.6  0.2 - 1.2 mg/dL Final   Alkaline Phosphatase 12/22/2023 90  39 - 117 U/L Final   AST 12/22/2023 17  0 - 37 U/L Final   ALT 12/22/2023 15  0 - 53 U/L Final   Total Protein 12/22/2023 6.7  6.0 - 8.3 g/dL Final   Albumin 88/94/7974 4.4  3.5 - 5.2 g/dL Final   GFR 88/94/7974 39.29 (L)  >60.00 mL/min Final   Calcium  12/22/2023 9.3  8.4 - 10.5 mg/dL Final  Office Visit on 05/31/2023  Component Date Value Ref Range Status   TSH 05/31/2023 3.290  0.450 - 4.500 uIU/mL Final   Cholesterol 05/31/2023 207 (H)  <200 mg/dL Final   HDL 95/85/7974 99  > OR = 40 mg/dL Final   Triglycerides 95/85/7974 55  <150 mg/dL Final   LDL Cholesterol (Calc) 05/31/2023 94  mg/dL (calc) Final   Total CHOL/HDL Ratio 05/31/2023 2.1  <4.9  (calc) Final   Non-HDL Cholesterol (Calc) 05/31/2023 108  <130 mg/dL (calc) Final   Sodium 95/85/7974 137  135 - 145 mEq/L Final   Potassium 05/31/2023 4.8  3.5 - 5.1 mEq/L Final   Chloride 05/31/2023 103  96 - 112 mEq/L Final   CO2 05/31/2023 26  19 - 32 mEq/L Final   Glucose, Bld 05/31/2023 90  70 - 99 mg/dL Final   BUN 95/85/7974 30 (H)  6 - 23 mg/dL Final   Creatinine, Ser 05/31/2023 1.45  0.40 - 1.50 mg/dL Final   Total Bilirubin 05/31/2023 0.5  0.2 - 1.2 mg/dL Final   Alkaline Phosphatase 05/31/2023 94  39 - 117 U/L Final   AST 05/31/2023 19  0 - 37 U/L Final   ALT 05/31/2023 21  0 - 53 U/L Final   Total Protein 05/31/2023 6.6  6.0 - 8.3 g/dL Final   Albumin 95/85/7974 4.5  3.5 - 5.2 g/dL Final   GFR 95/85/7974 41.75 (L)  >60.00 mL/min Final   Calcium  05/31/2023 9.3  8.4 - 10.5 mg/dL Final   Hgb J8r MFr Bld 05/31/2023 5.4  4.6 - 6.5 % Final   VITD 05/31/2023 79.84  30.00 - 100.00 ng/mL Final   PSA 05/31/2023 8.48 (H)  0.10 - 4.00 ng/mL Final  Office Visit on 12/15/2022  Component Date Value Ref Range Status   Color, Urine 12/15/2022 YELLOW  YELLOW Final   APPearance 12/15/2022 CLEAR  CLEAR Final   Specific Gravity, Urine 12/15/2022 1.012  1.001 - 1.035 Final   pH 12/15/2022 6.0  5.0 - 8.0 Final   Glucose, UA 12/15/2022 NEGATIVE  NEGATIVE Final  Bilirubin Urine 12/15/2022 NEGATIVE  NEGATIVE Final   Ketones, ur 12/15/2022 NEGATIVE  NEGATIVE Final   Hgb urine dipstick 12/15/2022 NEGATIVE  NEGATIVE Final   Protein, ur 12/15/2022 NEGATIVE  NEGATIVE Final   Nitrite 12/15/2022 NEGATIVE  NEGATIVE Final   Leukocytes,Ua 12/15/2022 NEGATIVE  NEGATIVE Final   Creatinine, Urine 12/15/2022 60  20 - 320 mg/dL Final   Microalb, Ur 89/70/7975 8.5  mg/dL Final   Microalb Creat Ratio 12/15/2022 142 (H)  <30 mg/g creat Final   PSA 12/15/2022 4.94 (H)  < OR = 4.00 ng/mL Final   PTH 12/15/2022 54  16 - 77 pg/mL Final   WBC 12/15/2022 5.2  3.8 - 10.8 Thousand/uL Final   RBC 12/15/2022 4.27   4.20 - 5.80 Million/uL Final   Hemoglobin 12/15/2022 13.0 (L)  13.2 - 17.1 g/dL Final   HCT 89/70/7975 39.6  38.5 - 50.0 % Final   MCV 12/15/2022 92.7  80.0 - 100.0 fL Final   MCH 12/15/2022 30.4  27.0 - 33.0 pg Final   MCHC 12/15/2022 32.8  32.0 - 36.0 g/dL Final   RDW 89/70/7975 12.8  11.0 - 15.0 % Final   Platelets 12/15/2022 237  140 - 400 Thousand/uL Final   MPV 12/15/2022 9.3  7.5 - 12.5 fL Final   Neutro Abs 12/15/2022 3,266  1,500 - 7,800 cells/uL Final   Absolute Lymphocytes 12/15/2022 1,300  850 - 3,900 cells/uL Final   Absolute Monocytes 12/15/2022 473  200 - 950 cells/uL Final   Eosinophils Absolute 12/15/2022 130  15 - 500 cells/uL Final   Basophils Absolute 12/15/2022 31  0 - 200 cells/uL Final   Neutrophils Relative % 12/15/2022 62.8  % Final   Total Lymphocyte 12/15/2022 25.0  % Final   Monocytes Relative 12/15/2022 9.1  % Final   Eosinophils Relative 12/15/2022 2.5  % Final   Basophils Relative 12/15/2022 0.6  % Final   Glucose, Bld 12/15/2022 93  65 - 99 mg/dL Final   BUN 89/70/7975 34 (H)  7 - 25 mg/dL Final   Creat 89/70/7975 1.49 (H)  0.70 - 1.22 mg/dL Final   eGFR 89/70/7975 44 (L)  > OR = 60 mL/min/1.60m2 Final   BUN/Creatinine Ratio 12/15/2022 23 (H)  6 - 22 (calc) Final   Sodium 12/15/2022 139  135 - 146 mmol/L Final   Potassium 12/15/2022 4.9  3.5 - 5.3 mmol/L Final   Chloride 12/15/2022 104  98 - 110 mmol/L Final   CO2 12/15/2022 27  20 - 32 mmol/L Final   Calcium  12/15/2022 9.5  8.6 - 10.3 mg/dL Final   Total Protein 89/70/7975 6.8  6.1 - 8.1 g/dL Final   Albumin 89/70/7975 4.3  3.6 - 5.1 g/dL Final   Globulin 89/70/7975 2.5  1.9 - 3.7 g/dL (calc) Final   AG Ratio 12/15/2022 1.7  1.0 - 2.5 (calc) Final   Total Bilirubin 12/15/2022 0.5  0.2 - 1.2 mg/dL Final   Alkaline phosphatase (APISO) 12/15/2022 85  35 - 144 U/L Final   AST 12/15/2022 17  10 - 35 U/L Final   ALT 12/15/2022 18  9 - 46 U/L Final   Magnesium 12/15/2022 2.1  1.5 - 2.5 mg/dL Final    Cholesterol 89/70/7975 203 (H)  <200 mg/dL Final   HDL 89/70/7975 92  > OR = 40 mg/dL Final   Triglycerides 89/70/7975 68  <150 mg/dL Final   LDL Cholesterol (Calc) 12/15/2022 96  mg/dL (calc) Final   Total CHOL/HDL Ratio 12/15/2022 2.2  <  5.0 (calc) Final   Non-HDL Cholesterol (Calc) 12/15/2022 111  <130 mg/dL (calc) Final   TSH 89/70/7975 2.82  0.40 - 4.50 mIU/L Final   Hgb A1c MFr Bld 12/15/2022 5.4  <5.7 % of total Hgb Final   Mean Plasma Glucose 12/15/2022 108  mg/dL Final   eAG (mmol/L) 89/70/7975 6.0  mmol/L Final   Insulin  12/15/2022 11.3  uIU/mL Final  Office Visit on 06/12/2022  Component Date Value Ref Range Status   WBC 06/12/2022 5.5  3.8 - 10.8 Thousand/uL Final   RBC 06/12/2022 4.18 (L)  4.20 - 5.80 Million/uL Final   Hemoglobin 06/12/2022 12.8 (L)  13.2 - 17.1 g/dL Final   HCT 95/73/7975 38.2 (L)  38.5 - 50.0 % Final   MCV 06/12/2022 91.4  80.0 - 100.0 fL Final   MCH 06/12/2022 30.6  27.0 - 33.0 pg Final   MCHC 06/12/2022 33.5  32.0 - 36.0 g/dL Final   RDW 95/73/7975 13.2  11.0 - 15.0 % Final   Platelets 06/12/2022 232  140 - 400 Thousand/uL Final   MPV 06/12/2022 9.3  7.5 - 12.5 fL Final   Neutro Abs 06/12/2022 3,542  1,500 - 7,800 cells/uL Final   Lymphs Abs 06/12/2022 1,183  850 - 3,900 cells/uL Final   Absolute Monocytes 06/12/2022 567  200 - 950 cells/uL Final   Eosinophils Absolute 06/12/2022 160  15 - 500 cells/uL Final   Basophils Absolute 06/12/2022 50  0 - 200 cells/uL Final   Neutrophils Relative % 06/12/2022 64.4  % Final   Total Lymphocyte 06/12/2022 21.5  % Final   Monocytes Relative 06/12/2022 10.3  % Final   Eosinophils Relative 06/12/2022 2.9  % Final   Basophils Relative 06/12/2022 0.9  % Final   Glucose, Bld 06/12/2022 94  65 - 99 mg/dL Final   BUN 95/73/7975 27 (H)  7 - 25 mg/dL Final   Creat 95/73/7975 1.29 (H)  0.70 - 1.22 mg/dL Final   eGFR 95/73/7975 52 (L)  > OR = 60 mL/min/1.41m2 Final   BUN/Creatinine Ratio 06/12/2022 21  6 - 22 (calc)  Final   Sodium 06/12/2022 136  135 - 146 mmol/L Final   Potassium 06/12/2022 4.7  3.5 - 5.3 mmol/L Final   Chloride 06/12/2022 103  98 - 110 mmol/L Final   CO2 06/12/2022 27  20 - 32 mmol/L Final   Calcium  06/12/2022 9.2  8.6 - 10.3 mg/dL Final   Total Protein 95/73/7975 6.3  6.1 - 8.1 g/dL Final   Albumin 95/73/7975 4.3  3.6 - 5.1 g/dL Final   Globulin 95/73/7975 2.0  1.9 - 3.7 g/dL (calc) Final   AG Ratio 06/12/2022 2.2  1.0 - 2.5 (calc) Final   Total Bilirubin 06/12/2022 0.6  0.2 - 1.2 mg/dL Final   Alkaline phosphatase (APISO) 06/12/2022 89  35 - 144 U/L Final   AST 06/12/2022 20  10 - 35 U/L Final   ALT 06/12/2022 21  9 - 46 U/L Final   Cholesterol 06/12/2022 190  <200 mg/dL Final   HDL 95/73/7975 93  > OR = 40 mg/dL Final   Triglycerides 95/73/7975 95  <150 mg/dL Final   LDL Cholesterol (Calc) 06/12/2022 79  mg/dL (calc) Final   Total CHOL/HDL Ratio 06/12/2022 2.0  <4.9 (calc) Final   Non-HDL Cholesterol (Calc) 06/12/2022 97  <130 mg/dL (calc) Final   Hgb J8r MFr Bld 06/12/2022 5.4  <5.7 % of total Hgb Final   Mean Plasma Glucose 06/12/2022 108  mg/dL Final  eAG (mmol/L) 06/12/2022 6.0  mmol/L Final   Vit D, 25-Hydroxy 06/12/2022 77  30 - 100 ng/mL Final  No image results found. MR LUMBAR SPINE W WO CONTRAST Result Date: 03/09/2024 EXAM: MRI LUMBAR SPINE 03/04/2024 03:50:05 PM TECHNIQUE: Multiplanar multisequence MRI of the lumbar spine was performed with and without the administration of intravenous contrast. CONTRAST: 9 mL of Vueway . COMPARISON: MR Lumbar Spine without IV contrast 01/05/2018. Thoracic MRI reported separately today. CLINICAL HISTORY: 89 year old male. Metastatic disease evaluation, spinal cord injury follow-up, prostate cancer, neurogenic claudication. FINDINGS: Normal lumbar segmentation which is concordant with the previous exam and the thoracic numbering today. BONES AND ALIGNMENT: Levoconvex lumbar scoliosis appears degenerative in nature and progressed since  2019. Normal vertebral body heights. Chronic benign L4 vertebral body hemangioma. Similar smaller hemangioma at L2. Stable chronic hemangioma versus degenerative nodular STIR hyperintensity and signal change at the central inferior L2 body (series 308 image 11, no change since 2019). Degenerative endplate Schmorl nodes including superiorly at L4. Normal background bone marrow signal. Intact visible sacrum. Degenerative appearing patchy endplate edema and enhancement at L1-L2, with disc space loss at that level. No destructive or suspicious lumbar osseous lesion identified. SPINAL CORD: Normalized signal and appearance of the conus medullaris since the previous MRI. Conus appears normal now at T12-L1. No abnormal intradural enhancement or suspicious dural thickening. SOFT TISSUES: No paraspinal mass. Diverticulosis of the large bowel in the pelvis. DEGENERATIVE: Multifactorial degenerative lumbar spinal stenosis has progressed since 2019 as follows: Moderate to severe at L2-L3 (previously mild). Severe at L3-L4 (series 314 image 24, previously moderate). Severe at L4-L5 (series 314 image 29, previously moderate to severe). Moderate at L5-S1 (previously mild). Severe multifactorial left L5 neural foraminal stenosis is chronic but progressed. IMPRESSION: 1. Widespread and progressed since 2019 lumbar spine degeneration and spinal stenosis. No convincing lumbar metastatic disease. Electronically signed by: Helayne Hurst MD 03/09/2024 10:57 AM EST RP Workstation: [id number]   MR THORACIC SPINE W WO CONTRAST Result Date: 03/09/2024 EXAM: MRI THORACIC SPINE WITH AND WITHOUT INTRAVENOUS CONTRAST 03/04/2024 03:50:48 PM TECHNIQUE: Multiplanar multisequence MRI of the thoracic spine was performed with and without the administration of intravenous contrast. CONTRAST: 9 mL of Vueway . COMPARISON: MR Thoracic Spine without then with IV contrast 01/22/2018. Comparison cervical spine MRI reported separately today. CLINICAL HISTORY:  89 year old male. Metastatic disease evaluation; Mid-back pain. Prostate cancer. Neurogenic claudication. FINDINGS: Thoracic spine segmentation appears to be normal. BONES AND ALIGNMENT: Normal vertebral body heights. Background marrow signal is normal. Mild degenerative appearing endplate marrow edema at T7 and T8, disc space loss and small endplate schmorl nodes there. Similar faint degenerative appearing T6 superior endplate marrow edema. Mild degenerative anterolisthesis of T10 on T11. Small round hyperintense and enhancing T1 hypointense bone lesion of the left T7 posterior elements at the lamina / pars (series 324 image 14). And similar small but suspicious area of STIR hyperintensity and marrow edema in the right T10 transverse process (series 319 image 1). SPINAL CORD: Normal spinal cord volume. Normal spinal cord signal. No abnormal intradural enhancement. No dural thickening. SOFT TISSUES: Portuous descending thoracic aorta. Otherwise negative visible chest and upper abdominal viscera. DEGENERATIVE CHANGES: Capacious thoracic spinal canal. Mild or age appropriate thoracic spine degeneration with no thoracic spinal stenosis above T11-T12. T10-T11 and T11-T12 subtle anterolisthesis with disc bulging and posterior element hypertrophy. Borderline to mild multifactorial spinal stenosis at T11-T12 (series 320 image 35). Moderate to severe left T10, severe left T11 neural foraminal stenosis. Moderate right  T10 and moderate to severe right T11 degenerative neural foraminal stenosis. IMPRESSION: 1. Evidence of sub-centimeter small bone metastases of the T7 and right T10 posterior elements. No other metastatic disease identified in the thoracic spine. 2. Ordinary thoracic spine degeneration, mild for age except at T10-T11 and T11-T12. Electronically signed by: Helayne Hurst MD 03/09/2024 10:48 AM EST RP Workstation: [id number]   MR CERVICAL SPINE W WO CONTRAST Result Date: 03/09/2024 EXAM: MRI CERVICAL SPINE WITH  AND WITHOUT CONTRAST 03/07/2024 10:15:44 AM TECHNIQUE: Multiplanar multisequence MRI of the cervical spine was performed without and with the administration of intravenous contrast. CONTRAST: 9ml COMPARISON: MRI Cervical and thoracic spine without then with IV contrast 01/22/2018. Head to comparison cervical spine CT 01/30/2022. CLINICAL HISTORY: 89 year old male with neck pain, suspected spondyloarthropathy, and prostate cancer, undergoing metastatic disease evaluation for neurogenic claudication. FINDINGS: BONES AND ALIGNMENT: Normal cervical lordosis. Normal vertebral body heights. Background marrow signal is normal. No abnormal marrow edema or enhancement. SPINAL CORD: Normal spinal cord size. Normal spinal cord signal. No abnormal intradural enhancement. No cervical dural thickening. Negative visible cervicomedullary junction, posterior fossa. SOFT TISSUES: No paraspinal mass. Incidental chronic suboccipital dermal or subdermal sebaceous cyst (series 307 image 9). Preserved major vascular flow voids in the bilateral neck. Negative visible neck soft tissues aside from trace retained secretions in the pharynx. DEGENERATIVE: Mostly age appropriate cervical spine degeneration. Multifactorial mild cervical spinal stenosis at C3-C4 related to disc osteophyte degeneration and moderate posterior element hypertrophy. Degenerative mild bilateral facet joint effusions. Moderate to severe bilateral C4 neural foraminal stenosis associated. Borderline to mild multifactorial spinal stenosis at C5-C6. Moderate to severe right C5 and bilateral C6 neural foraminal stenosis. IMPRESSION: 1. No evidence of metastatic disease in the cervical spine. 2. Ordinary mostly age appropriate cervical spine degeneration. Mild spinal stenosis at C3-C4. Moderate to severe degenerative neural foraminal stenosis at the bilateral C4, right C5, and bilateral C6 nerve levels. Electronically signed by: Helayne Hurst MD 03/09/2024 10:40 AM EST RP  Workstation: [id number]   MR PROSTATE W WO CONTRAST Result Date: 01/05/2024 EXAM: MRI PROSTATE WITHOUT AND WITH INTRAVENOUS CONTRAST 01/05/2024 09:33:53 AM TECHNIQUE: Multiparametric MRI imaging of the prostate without and with dynamic contrast enhanced imaging and diffusion weighted imaging was performed. 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 Dynacad/CAD was utilized in analysis of images. COMPARISON: None available. CLINICAL HISTORY: Elevated PSA level. History of prostate cancer. FINDINGS: PROSTATE: Encapsulated nodularity in the transition zone compatible with benign prostatic hypertrophy. Prostate volume by 3D volumetric analysis: 117.7 cubic centimeters (6.7 x 5.1 x 6.5 cm). Region of interest # 1 : PI-RADS category 5 lesion of the left posteromedial peripheral zone in the mid gland and apex with focally reduced T2 signal ( image 78 series 9 ) corresponding to reduced ADC map activity and restricted diffusion (image 23 series 6 and 7) and focal early enhancement ( image 205 series 12). This measures 0.79 cubic centimeters (1.6 x 0.5 x 1.1 cm). Region of interest # 2 : PI-RADS category 4 lesion of the right anterior transition zone at the apex with low T2 signal (image 80 series 9) corresponding to reduced ADC map activity and restricted diffusion ( image 23 series 6 and 7). This measures 0.52 cubic centimeters ( 1.2 x 0.6 x 0.8 cm). SEMINAL VESICLES: Accentuated precontrast T1 signal in the right seminal  vesicle could represent peritoneal fluid or blood products. NEUROVASCULAR BUNDLE: Unremarkable. LYMPHADENOPATHY: No lymphadenopathy. BLADDER: The bladder is unremarkable. BOWEL: Sigmoid colon diverticulosis. PERITONEAL CAVITY: No free fluid. BONES: Normal bone marrow signal intensity. No suspicious or aggressive  osseous lesions. Severe right and moderate left hip arthropathy. Lower lumbar spondylosis and degenerative disc disease. SOFT TISSUES: No focal abnormality. IMPRESSION: 1. ROI #1: PI-RADS 5 lesion in the left posteromedial peripheral zone, mid gland and apex, measuring 0.79 cc. 2. ROI #2: PI-RADS 4 lesion in the right anterior transition zone at the apex, measuring 0.52 cc. 3. Benign prostatic hypertrophy with estimated prostate volume of 117.7 mL. 4. Accentuated precontrast T1 signal in the right seminal vesicle, possibly representing blood products or fluid. 5. Severe right and moderate left hip arthropathy. 6. Lower lumbar spondylosis and degenerative disc disease. Electronically signed by: Chasta Deshpande Salvage MD 01/05/2024 09:54 AM EST RP Workstation: [id number]         ASSESSMENT & PLAN   Assessment & Plan Prostate cancer metastatic to bone [facility]) Prostate cancer metastatic to bone   Prostate cancer has metastasized to T7 and right T10 posterior elements of the thoracic spine. He currently experiences no pain in these areas, but there is a risk of future pain if the metastasis progresses. Radiation therapy is recommended to prevent growth and potential pain. Androgen deprivation therapy (ADT) was discussed as an alternative to slow tumor growth, but he is concerned about side effects like hot flashes, fatigue, bone loss, and cognitive changes. He prefers radiation therapy for its targeted approach and potential to prevent severe pain and growth of spinal metastasis. He is referred to radiation oncology for evaluation and potential treatment of spinal metastasis. An MRI of the brain is ordered to rule out other causes of symptoms. He is encouraged to discuss treatment options and goals of care with the radiation oncologist. Updating advanced directives to reflect his current health status and preferences is recommended. Brain fog He reports mild cognitive changes, daughter feels this worsening, patient  aware occasional forgetfulness of recent events, but no severe cognitive impairment is noted. There is a family history of a brain tumor in his brother. An MRI of the brain is ordered to evaluate potential causes of cognitive changes. Cervical spondylosis Cervical spondylosis with radiculopathy   He has severe arthritis in the cervical spine with mild pressure causing radiculopathy and shoulder pain. Surgical intervention is not required. Physical therapy and pain management are recommended. He is referred to physical therapy for cervical spondylosis management and to a pain specialist for shoulder pain management.   ORDER ASSOCIATIONS  #   DIAGNOSIS / CONDITION ICD-10 ENCOUNTER ORDER     ICD-10-CM   1. Prostate cancer metastatic to bone [facility])  C61 Ambulatory referral to Pain Clinic   C79.51 Ambulatory referral to Physical Therapy    MR Brain W Wo Contrast    Ambulatory referral to Radiation Oncology    2. Brain fog  R41.89 Ambulatory referral to Pain Clinic    Ambulatory referral to Physical Therapy    MR Brain W Wo Contrast    Ambulatory referral to Radiation Oncology    3. Cervical spondylosis  M47.812            Orders Placed in Encounter:   Lab Orders  No laboratory test(s) ordered today   Imaging Orders         MR Brain W Wo Contrast     Referral Orders         Ambulatory  referral to Pain Clinic         Ambulatory referral to Physical Therapy         Ambulatory referral to Radiation Oncology     No orders of the defined types were placed in this encounter.   Orders Placed This Encounter  Procedures   MR Brain W Wo Contrast    Confusion/memory issue brain fog Metastatic prostate cancer in spine.    Standing Status:   Future    Expiration Date:   03/22/2025    If indicated for the ordered procedure, I authorize the administration of contrast media per Radiology protocol:   Yes    What is the patient's sedation requirement?:   No Sedation    Does the patient have a  pacemaker or implanted devices?:   No    Preferred imaging location?:   [facility] (table limit-550lbs)   Ambulatory referral to Pain Clinic    Referral Priority:   Routine    Referral Type:   Consultation    Referral Reason:   Specialty Services Required    Requested Specialty:   Pain Medicine    Number of Visits Requested:   1   Ambulatory referral to Physical Therapy    Referral Priority:   Routine    Referral Reason:   Specialty Services Required    Number of Visits Requested:   1   Ambulatory referral to Radiation Oncology    Referral Priority:   Routine    Referral Type:   Consultation    Referral Reason:   Specialty Services Required    Requested Specialty:   Radiation Oncology    Number of Visits Requested:   1   ED Discharge Orders          Ordered    Ambulatory referral to Pain Clinic        03/22/24 1001    Ambulatory referral to Physical Therapy       Comments: Extension of physical therapy with [facility] Arvella Admire to include addressing extensive MRI discovered arthritis spine   03/22/24 1001    MR Brain W Wo Contrast       Comments: Confusion/memory issue brain fog Metastatic prostate cancer in spine.   03/22/24 1001    Ambulatory referral to Radiation Oncology        03/22/24 1001            On the day of the visit, I dedicated 32 minutes to both direct and indirect patient care activities.  The time was spent: Preparation: I reviewed the patient's records before and during the visit to support individualized clinical decision making. History: I obtained, documented, and reviewed a thorough medical history. I reviewed the patient's reported symptoms and clarified their context and significance in relation to the current visit. Examination: I conducted a medically appropriate physical evaluation. Data Synthesis: I synthesized information for clinical decision-making. Communication: I communicated clinical status and plan to the  patient and/or family/caregiver. Counseling & Education: I provided personalized counseling on condition and treatment. Documentation: Documenting clinical findings and medical decision-making, and creating and providing documentation for patient review. Treatment Plan: I worked collaboratively with the patient to formulate and communicate an individualized plan (including shared decision-making). Orders: I placed necessary orders (medications, labs, imaging, referrals) in the EMR..  This time was spent independently of any separately billable procedures. Please note that this statement is intended to provide a clear and comprehensive account of the time and services provided  during the patient's visit.  The extended time spent was necessary to provide safe, effective, and comprehensive care due to the following factors:, Data Analysis & Complex Decision-Making: I performed in-depth data review and complex treatment planning tailored to the patient's unique clinical profile., Patient Requested In-Depth Education on Disease Management: At the patient's request, I provided additional time to thoroughly educate them on self management of their chronic condition, review lifestyle modifications, and clarify follow up plans-time that exceeded typical encounter duration and was not separately billable., Family Inclusion for Care Understanding: The patient asked that a family member (e.g., spouse, adult child) be included in the visit for teaching and care discussion. I spent extra time to ensure both understood the care plan, risks, and medication instructions beyond what is routine., and Patient Driven Goal Setting and Wellness Planning: The patient wanted extended discussion to develop specific health goals and a personalized wellness plan, including diet, exercise, and stress reduction strategies-time that exceeded standard visit and was not separately billable.     This document was synthesized by artificial  intelligence ([employer]bridge) using HIPAA-compliant recording of the clinical interaction;   We discussed the use of AI scribe software for clinical note transcription with the patient, who gave verbal consent to proceed. additional Info: This encounter employed state-of-the-art, real-time, collaborative documentation. The patient actively reviewed and assisted in updating their electronic medical record on a shared screen, ensuring transparency and facilitating joint problem-solving for the problem list, overview, and plan. This approach promotes accurate, informed care. The treatment plan was discussed and reviewed in detail, including medication safety, potential side effects, and all patient questions. We confirmed understanding and comfort with the plan. Follow-up instructions were established, including contacting the office for any concerns, returning if symptoms worsen, persist, or new symptoms develop, and precautions for potential emergency department visits.

## 2024-03-23 ENCOUNTER — Other Ambulatory Visit (HOSPITAL_COMMUNITY): Payer: Self-pay | Admitting: Urology

## 2024-03-23 DIAGNOSIS — C61 Malignant neoplasm of prostate: Secondary | ICD-10-CM

## 2024-03-23 NOTE — Progress Notes (Incomplete)
 Histology and Location of Primary Cancer:  Prostate Ca  Sites of Visceral and Bony Metastatic Disease: T7 and right T10 posterior   Location(s) of Symptomatic Metastases: T7 and right T10 posterior   Past/Anticipated chemotherapy by medical oncology, if any: NA  03/22/2024 Dr. Bernardino Cone    If Spine Met(s), symptoms, if any, include: Bowel/Bladder retention or incontinence (please describe): *** Numbness or weakness in extremities (please describe): *** Pain on a scale of 0-10 is: {Number; 1-10  not applicable:20727}  Current Decadron  regimen, if applicable: ***  Ambulatory status? Walker? Wheelchair?: {VQI Ambulatory Status:20974}  SAFETY ISSUES: Prior radiation? {:18581} Pacemaker/ICD? {:18581} Possible current pregnancy? Male Is the patient on methotrexate? No  Current Complaints / other details:    30 minutes spent total, including time for meaningful use questions, reviewing medication, as well as spent in face-to-face time in nurse evaluation with the patient.

## 2024-03-28 ENCOUNTER — Ambulatory Visit

## 2024-03-28 ENCOUNTER — Ambulatory Visit: Admitting: Radiation Oncology

## 2024-04-06 ENCOUNTER — Ambulatory Visit: Admitting: Internal Medicine

## 2024-04-06 ENCOUNTER — Encounter (HOSPITAL_COMMUNITY)

## 2024-04-20 ENCOUNTER — Ambulatory Visit: Admitting: Internal Medicine

## 2024-04-24 ENCOUNTER — Ambulatory Visit: Admitting: Internal Medicine
